# Patient Record
Sex: Male | Born: 1945
Health system: Southern US, Community
[De-identification: ages and names within clinical notes are randomized; demographics above are authoritative.]

## PROBLEM LIST (undated history)

## (undated) DIAGNOSIS — J3800 Paralysis of vocal cords and larynx, unspecified: Secondary | ICD-10-CM

## (undated) DIAGNOSIS — D649 Anemia, unspecified: Secondary | ICD-10-CM

## (undated) DIAGNOSIS — G4730 Sleep apnea, unspecified: Secondary | ICD-10-CM

## (undated) DIAGNOSIS — K219 Gastro-esophageal reflux disease without esophagitis: Secondary | ICD-10-CM

## (undated) DIAGNOSIS — K319 Disease of stomach and duodenum, unspecified: Secondary | ICD-10-CM

## (undated) DIAGNOSIS — J189 Pneumonia, unspecified organism: Secondary | ICD-10-CM

## (undated) DIAGNOSIS — D492 Neoplasm of unspecified behavior of bone, soft tissue, and skin: Secondary | ICD-10-CM

## (undated) DIAGNOSIS — C754 Malignant neoplasm of carotid body: Secondary | ICD-10-CM

## (undated) DIAGNOSIS — T8859XA Other complications of anesthesia, initial encounter: Secondary | ICD-10-CM

## (undated) DIAGNOSIS — K5790 Diverticulosis of intestine, part unspecified, without perforation or abscess without bleeding: Secondary | ICD-10-CM

## (undated) DIAGNOSIS — F329 Major depressive disorder, single episode, unspecified: Secondary | ICD-10-CM

## (undated) DIAGNOSIS — E785 Hyperlipidemia, unspecified: Secondary | ICD-10-CM

## (undated) DIAGNOSIS — F32A Depression, unspecified: Secondary | ICD-10-CM

## (undated) DIAGNOSIS — H9190 Unspecified hearing loss, unspecified ear: Secondary | ICD-10-CM

## (undated) DIAGNOSIS — H6690 Otitis media, unspecified, unspecified ear: Secondary | ICD-10-CM

## (undated) DIAGNOSIS — T4145XA Adverse effect of unspecified anesthetic, initial encounter: Secondary | ICD-10-CM

## (undated) DIAGNOSIS — M1990 Unspecified osteoarthritis, unspecified site: Secondary | ICD-10-CM

## (undated) DIAGNOSIS — K5792 Diverticulitis of intestine, part unspecified, without perforation or abscess without bleeding: Secondary | ICD-10-CM

## (undated) DIAGNOSIS — K222 Esophageal obstruction: Secondary | ICD-10-CM

## (undated) DIAGNOSIS — N400 Enlarged prostate without lower urinary tract symptoms: Secondary | ICD-10-CM

## (undated) DIAGNOSIS — F419 Anxiety disorder, unspecified: Secondary | ICD-10-CM

## (undated) DIAGNOSIS — I700 Atherosclerosis of aorta: Secondary | ICD-10-CM

## (undated) HISTORY — PX: CAROTID BODY TUMOR EXCISION: SHX5156

## (undated) HISTORY — DX: Major depressive disorder, single episode, unspecified: F32.9

## (undated) HISTORY — PX: ESOPHAGOGASTRODUODENOSCOPY (EGD) WITH ESOPHAGEAL DILATION: SHX5812

## (undated) HISTORY — DX: Benign prostatic hyperplasia without lower urinary tract symptoms: N40.0

## (undated) HISTORY — DX: Diverticulosis of intestine, part unspecified, without perforation or abscess without bleeding: K57.90

## (undated) HISTORY — DX: Gastro-esophageal reflux disease without esophagitis: K21.9

## (undated) HISTORY — PX: BACK SURGERY: SHX140

## (undated) HISTORY — DX: Otitis media, unspecified, unspecified ear: H66.90

## (undated) HISTORY — DX: Pneumonia, unspecified organism: J18.9

## (undated) HISTORY — DX: Diverticulitis of intestine, part unspecified, without perforation or abscess without bleeding: K57.92

## (undated) HISTORY — DX: Malignant neoplasm of carotid body: C75.4

## (undated) HISTORY — PX: CHOLECYSTECTOMY OPEN: SUR202

## (undated) HISTORY — DX: Disease of stomach and duodenum, unspecified: K31.9

## (undated) HISTORY — PX: APPENDECTOMY: SHX54

## (undated) HISTORY — DX: Esophageal obstruction: K22.2

## (undated) HISTORY — DX: Depression, unspecified: F32.A

## (undated) HISTORY — PX: PATELLA ARTHROPLASTY: SUR73

## (undated) HISTORY — PX: KNEE ARTHROSCOPY: SHX127

## (undated) HISTORY — DX: Paralysis of vocal cords and larynx, unspecified: J38.00

## (undated) HISTORY — DX: Essential (primary) hypertension: I10

## (undated) HISTORY — PX: DEEP NECK LYMPH NODE BIOPSY / EXCISION: SUR126

## (undated) HISTORY — PX: UPPER GASTROINTESTINAL ENDOSCOPY: SHX188

## (undated) HISTORY — PX: JOINT REPLACEMENT: SHX530

## (undated) HISTORY — DX: Neoplasm of unspecified behavior of bone, soft tissue, and skin: D49.2

## (undated) HISTORY — DX: Hyperlipidemia, unspecified: E78.5

---
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
## 2001-04-27 ENCOUNTER — Ambulatory Visit (HOSPITAL_BASED_OUTPATIENT_CLINIC_OR_DEPARTMENT_OTHER): Admission: RE | Admit: 2001-04-27 | Discharge: 2001-04-27 | Payer: Self-pay | Admitting: Orthopedic Surgery

## 2006-09-19 DIAGNOSIS — J3801 Paralysis of vocal cords and larynx, unilateral: Secondary | ICD-10-CM

## 2006-09-19 DIAGNOSIS — Z8603 Personal history of neoplasm of uncertain behavior: Secondary | ICD-10-CM

## 2006-09-19 DIAGNOSIS — D492 Neoplasm of unspecified behavior of bone, soft tissue, and skin: Secondary | ICD-10-CM

## 2006-09-19 HISTORY — DX: Paralysis of vocal cords and larynx, unilateral: J38.01

## 2006-09-19 HISTORY — DX: Personal history of neoplasm of uncertain behavior: Z86.03

## 2006-09-19 HISTORY — DX: Neoplasm of unspecified behavior of bone, soft tissue, and skin: D49.2

## 2006-10-23 ENCOUNTER — Encounter: Admission: RE | Admit: 2006-10-23 | Discharge: 2006-10-23 | Payer: Self-pay | Admitting: Otolaryngology

## 2006-12-19 HISTORY — PX: OTHER SURGICAL HISTORY: SHX169

## 2007-01-17 DIAGNOSIS — J3800 Paralysis of vocal cords and larynx, unspecified: Secondary | ICD-10-CM | POA: Insufficient documentation

## 2007-03-15 ENCOUNTER — Ambulatory Visit: Payer: Self-pay | Admitting: Internal Medicine

## 2007-09-20 DIAGNOSIS — D126 Benign neoplasm of colon, unspecified: Secondary | ICD-10-CM

## 2007-09-20 HISTORY — DX: Benign neoplasm of colon, unspecified: D12.6

## 2010-06-22 ENCOUNTER — Encounter: Admission: RE | Admit: 2010-06-22 | Discharge: 2010-06-22 | Payer: Self-pay | Admitting: Emergency Medicine

## 2010-08-18 ENCOUNTER — Inpatient Hospital Stay (HOSPITAL_COMMUNITY)
Admission: EM | Admit: 2010-08-18 | Discharge: 2010-08-20 | Payer: Self-pay | Source: Home / Self Care | Admitting: Emergency Medicine

## 2010-10-10 ENCOUNTER — Encounter: Payer: Self-pay | Admitting: Family Medicine

## 2010-11-29 LAB — CBC
HCT: 30.9 % — ABNORMAL LOW (ref 39.0–52.0)
HCT: 33.3 % — ABNORMAL LOW (ref 39.0–52.0)
Hemoglobin: 10.3 g/dL — ABNORMAL LOW (ref 13.0–17.0)
Hemoglobin: 11 g/dL — ABNORMAL LOW (ref 13.0–17.0)
MCH: 30.5 pg (ref 26.0–34.0)
MCH: 30.9 pg (ref 26.0–34.0)
MCHC: 33 g/dL (ref 30.0–36.0)
MCHC: 33.3 g/dL (ref 30.0–36.0)
MCV: 92.2 fL (ref 78.0–100.0)
MCV: 92.8 fL (ref 78.0–100.0)
Platelets: 147 10*3/uL — ABNORMAL LOW (ref 150–400)
Platelets: 151 10*3/uL (ref 150–400)
RBC: 3.33 MIL/uL — ABNORMAL LOW (ref 4.22–5.81)
RBC: 3.61 MIL/uL — ABNORMAL LOW (ref 4.22–5.81)
RDW: 13.8 % (ref 11.5–15.5)
RDW: 14.4 % (ref 11.5–15.5)
WBC: 12.8 10*3/uL — ABNORMAL HIGH (ref 4.0–10.5)
WBC: 15.1 10*3/uL — ABNORMAL HIGH (ref 4.0–10.5)

## 2010-11-29 LAB — COMPREHENSIVE METABOLIC PANEL
ALT: 37 U/L (ref 0–53)
AST: 29 U/L (ref 0–37)
Albumin: 2.7 g/dL — ABNORMAL LOW (ref 3.5–5.2)
Alkaline Phosphatase: 54 U/L (ref 39–117)
BUN: 18 mg/dL (ref 6–23)
CO2: 28 mEq/L (ref 19–32)
Calcium: 7.9 mg/dL — ABNORMAL LOW (ref 8.4–10.5)
Chloride: 104 mEq/L (ref 96–112)
Creatinine, Ser: 1.09 mg/dL (ref 0.4–1.5)
GFR calc Af Amer: >60 mL/min (ref >60)
GFR calc non Af Amer: >60 mL/min (ref >60)
Glucose, Bld: 113 mg/dL — ABNORMAL HIGH (ref 70–99)
Potassium: 3.9 mEq/L (ref 3.5–5.1)
Sodium: 137 mEq/L (ref 135–145)
Total Bilirubin: 1.4 mg/dL — ABNORMAL HIGH (ref 0.3–1.2)
Total Protein: 5.2 g/dL — ABNORMAL LOW (ref 6.0–8.3)

## 2010-11-30 LAB — BASIC METABOLIC PANEL
BUN: 15 mg/dL (ref 6–23)
CO2: 26 mEq/L (ref 19–32)
Calcium: 8.2 mg/dL — ABNORMAL LOW (ref 8.4–10.5)
Chloride: 109 mEq/L (ref 96–112)
Creatinine, Ser: 1.15 mg/dL (ref 0.4–1.5)
GFR calc Af Amer: >60 mL/min (ref >60)
GFR calc non Af Amer: >60 mL/min (ref >60)
Glucose, Bld: 178 mg/dL — ABNORMAL HIGH (ref 70–99)
Potassium: 4.1 mEq/L (ref 3.5–5.1)
Sodium: 138 mEq/L (ref 135–145)

## 2010-11-30 LAB — CULTURE, BLOOD (ROUTINE X 2)
Culture  Setup Time: 201112010057
Culture  Setup Time: 201112010058
Culture: NO GROWTH
Culture: NO GROWTH

## 2010-11-30 LAB — DIFFERENTIAL
Basophils Absolute: 0 10*3/uL (ref 0.0–0.1)
Basophils Relative: 0 % (ref 0–1)
Eosinophils Absolute: 0 10*3/uL (ref 0.0–0.7)
Eosinophils Relative: 0 % (ref 0–5)
Lymphocytes Relative: 1 % — ABNORMAL LOW (ref 12–46)
Lymphs Abs: 0.2 10*3/uL — ABNORMAL LOW (ref 0.7–4.0)
Monocytes Absolute: 0.7 10*3/uL (ref 0.1–1.0)
Monocytes Relative: 4 % (ref 3–12)
Neutro Abs: 16.9 10*3/uL — ABNORMAL HIGH (ref 1.7–7.7)
Neutrophils Relative %: 95 % — ABNORMAL HIGH (ref 43–77)

## 2010-11-30 LAB — POCT CARDIAC MARKERS
CKMB, poc: <1 ng/mL — ABNORMAL LOW (ref 1.0–8.0)
Myoglobin, poc: 53.6 ng/mL (ref 12–200)
Troponin i, poc: <0.05 ng/mL (ref 0.00–0.09)

## 2010-11-30 LAB — CBC
HCT: 36.6 % — ABNORMAL LOW (ref 39.0–52.0)
Hemoglobin: 12.1 g/dL — ABNORMAL LOW (ref 13.0–17.0)
MCH: 30.7 pg (ref 26.0–34.0)
MCHC: 33.1 g/dL (ref 30.0–36.0)
MCV: 92.9 fL (ref 78.0–100.0)
Platelets: 194 10*3/uL (ref 150–400)
RBC: 3.94 MIL/uL — ABNORMAL LOW (ref 4.22–5.81)
RDW: 14 % (ref 11.5–15.5)
WBC: 17.8 10*3/uL — ABNORMAL HIGH (ref 4.0–10.5)

## 2011-02-01 NOTE — Assessment & Plan Note (Signed)
[name]                             PULMONARY OFFICE NOTE   NAME:Chris Welch                     MRN:          [id number]  DATE:03/15/2007                            DOB:          [birth year]    REASON FOR CONSULTATION:  Pulmonary infiltrates.   HISTORY:  This is a very complicated 65 year old white male who was well  in December of 2007 but subsequently developed hoarseness and was seen  by Dr. Jearld Fenton who referred him to Dr. Lendell Caprice at [facility].  There he has  undergone several throat surgeries and has been complicated by  documented aspiration syndrome for which he is under week 2 of a 12 week  speech therapy treatment program.  He has had several different rounds  of antibiotics for pneumonia, presumed to be aspiration, and has had a  persistent abnormal chest x-ray for which he is now seen at the request  of Dr. Perrin Maltese.   Presently, the patient says he is overall better with less dyspnea and  dysphagia compared to when he was at  his worst.  He denies any excess  sputum production. He has no purulence or pleuritic pain, fever, chills  or sweats, orthopnea, PND or leg swelling.   PAST MEDICAL HISTORY:  Significant only for throat surgery as noted.   ALLERGIES:  PENICILLIN causes hives.   MEDICATIONS:  Omeprazole 20 mg, two b.i.d. before meals with ProAir  p.r.n., which he says helps some.   SOCIAL HISTORY:  He has never smoked.  He has no unusual travel, pet or  hobby exposure.   FAMILY HISTORY:  Positive for emphysema in his father who is a smoker.   REVIEW OF SYSTEMS:  Taken in detail on the work sheet and is negative  except as outlined above.  He has lost 10 to 15 pounds since the  surgery.   PHYSICAL EXAMINATION:  GENERAL APPEARANCE:  This is a hoarse, ambulatory  white male who actually is relatively stoic and in no acute distress.  His wife appears to be in much more distress than he regarding these  issues.  VITAL SIGNS:  He is afebrile with stable vital signs.  HEENT:  Unremarkable. Pharynx clear.  LUNG:  Fields reveal diminished breath sounds but no wheezing.  HEART:  There is a regular rate and rhythm without murmurs, rubs or  gallops.  ABDOMEN:  Soft, benign.  EXTREMITIES:  Warm, benign without calf tenderness, cyanosis, clubbing  or edema.   CLINICAL DATA:  Chest x-ray was reviewed today and is normal.   IMPRESSION:  Recurrent aspiration pneumonia in this patient with chronic  upper airway issues following throat surgery.  I am not exactly sure  what was done nor what the plan is but I have explained to the patient  and, more importantly, to his wife that they can expect that until and  unless his upper airway fully heals from surgery that he will be short  of breath if there is obstruction at the level of the vocal cords and  have a tendency towards recurrent aspiration, if there is any problem at  all related to the swallowing maneuver.  Since he is only on week 2 of  12 of planned therapy for speech, I endorsed this whole-heartedly and  recommended also continued treatment of his gastroesophageal reflux  disease, since it does give [place] one opportunity to help the upper airway  heal and I reviewed with him a diet for this purpose, along with the  avoidance of mint and menthol (apparently he likes to eat a lot of  peppermint).   If he does become short of breath or get more wheezing or cough he needs  to use the ProAir up to every four hours.  If he begins to have purulent  sputum or fever then I would definitely consider treating him with  antibiotics but note that with chronic aspirators, it is probably better  to wait until definite evidence of infection ( rather than treating  chronically or too soon because of the issue of developing resistance).  This seemed to assure his wife that he was on the right track and I have  told them pulmonary followup can be on a p.r.n. basis (or arranged by   his ENT physician in the [facility] to keep things all under one  roof).     Chris Welch. Sherene Sires, MD, [facility]  Electronically Signed    MBW/MedQ  DD: 03/16/2007  DT: 03/16/2007  Job #: [id number]   cc:   Jonita Albee, M.D.

## 2011-02-04 NOTE — Op Note (Signed)
[place]  Patient:    Chris Welch, Chris Welch                     MRN: [id number] Proc. Date: 04/27/01 Adm. Date:  [id number] Attending:  Ronne Binning                           Operative Report  PREOPERATIVE DIAGNOSIS:  Laceration central slip, boutonniere deformity right middle finger.  POSTOPERATIVE DIAGNOSIS:  Laceration central slip, boutonniere deformity right middle finger.  OPERATION:  Exploration/repair central slip, right middle finger.  SURGEON:  Nicki Reaper, M.D.  ASSISTANT:  Joaquin Courts, R.N.  ANESTHESIA:  Forearm-based IV regional.  DATE OF OPERATION:  April 27, 2001  ANESTHESIOLOGIST:  Edwin Cap. Zoila Shutter, M.D.  HISTORY:  The patient is a 65 year old male who suffered a laceration over the PIP joint area of his right middle finger.  He has had a gradual formation of a deformity of the boutonniere to his right middle finger.  PROCEDURE:  The patient is brought to the operating room where a forearm-based IV regional anesthetic was carried out without difficulty.  He was prepped and draped using Betadine scrub and solution with the right arm free in a supine position.  Curvilinear incision was made over the proximal middle phalanx, carrying it across to the PIP joint.  This was carried down through subcutaneous tissue, bleeders electrocauterized.  The central slip extensor tendon hood was identified.  Significant scarring was present over the PIP joint.  The joint was opened after localization of the distal-most aspect of the central slip proximally.  The scar was excised.  There was a portion of central slip present on the middle phalanx and repair was performed using a modified Kessler using 3-0 Ethibond.  Prior to closure of the tendon, a 3.5 K-wire was passed across the joint holding it in full extension.  The tendon was then tied and secured.  Two figure-of-eight sutures were also added to the repair.  This was done  following irrigation.  The skin was closed with interrupted 5-0 nylon sutures.  Sterile compressive dressing and splint to the finger was applied, leaving the DIP free.  The patient tolerated the procedure well and was taken to the recovery room for observation in satisfactory condition.  DISPOSITION:  He is discharged home to return to [facility] [place] in one week on Vicodin and Septra DS. DD:  04/27/01 TD:  04/28/01 Job: [id number]

## 2011-02-16 ENCOUNTER — Telehealth: Payer: Self-pay | Admitting: *Deleted

## 2011-02-16 ENCOUNTER — Ambulatory Visit (AMBULATORY_SURGERY_CENTER): Payer: Managed Care, Other (non HMO) | Admitting: *Deleted

## 2011-02-16 ENCOUNTER — Encounter: Payer: Self-pay | Admitting: Internal Medicine

## 2011-02-16 VITALS — Ht 75.0 in | Wt 200.7 lb

## 2011-02-16 DIAGNOSIS — Z86010 Personal history of colonic polyps: Secondary | ICD-10-CM

## 2011-02-16 MED ORDER — PEG-KCL-NACL-NASULF-NA ASC-C 100 G PO SOLR: ORAL | Status: DC

## 2011-02-16 NOTE — Progress Notes (Signed)
Pt had colonoscopy in 2009 with Dr. Charna Elizabeth. Release of information form signed and given to Ronny Bacon, CMA.  Ezra Sites

## 2011-02-16 NOTE — Telephone Encounter (Signed)
Pt had colonoscopy in 2009 with Dr. Charna Elizabeth. Pt says he had polyps, does not know what kind of polyps. Release of information form signed and given to Ronny Bacon, CMA.  Ezra Sites

## 2011-02-16 NOTE — Telephone Encounter (Signed)
Release faxed to Dr Kenna Gilbert office.

## 2011-02-24 ENCOUNTER — Encounter: Payer: Self-pay | Admitting: Internal Medicine

## 2011-02-24 ENCOUNTER — Ambulatory Visit (AMBULATORY_SURGERY_CENTER): Payer: Managed Care, Other (non HMO) | Admitting: Internal Medicine

## 2011-02-24 VITALS — BP 156/94 | HR 78 | Temp 97.9°F | Resp 20 | Ht 75.0 in | Wt 191.0 lb

## 2011-02-24 DIAGNOSIS — D128 Benign neoplasm of rectum: Secondary | ICD-10-CM

## 2011-02-24 DIAGNOSIS — D129 Benign neoplasm of anus and anal canal: Secondary | ICD-10-CM

## 2011-02-24 DIAGNOSIS — Z86010 Personal history of colonic polyps: Secondary | ICD-10-CM

## 2011-02-24 DIAGNOSIS — Z1211 Encounter for screening for malignant neoplasm of colon: Secondary | ICD-10-CM

## 2011-02-24 DIAGNOSIS — D126 Benign neoplasm of colon, unspecified: Secondary | ICD-10-CM

## 2011-02-24 MED ORDER — SODIUM CHLORIDE 0.9 % IV SOLN: 500.0000 mL | INTRAVENOUS | Status: DC

## 2011-02-24 NOTE — Patient Instructions (Signed)
Findings:  Diverticulosis, Polyp  Recommendations:  High Fiber diet, Repeat colonoscopy in 5-7 years.  Instructions and teaching explained and given to care partner.

## 2011-02-25 ENCOUNTER — Telehealth: Payer: Self-pay

## 2011-02-25 NOTE — Telephone Encounter (Signed)
No answer

## 2011-03-01 ENCOUNTER — Encounter: Payer: Self-pay | Admitting: Internal Medicine

## 2011-03-04 ENCOUNTER — Encounter: Payer: Self-pay | Admitting: Internal Medicine

## 2011-04-18 ENCOUNTER — Emergency Department (HOSPITAL_COMMUNITY)
Admission: EM | Admit: 2011-04-18 | Discharge: 2011-04-18 | Disposition: A | Discharge status: Home / Self Care | Payer: Managed Care, Other (non HMO) | Attending: Emergency Medicine | Admitting: Emergency Medicine

## 2011-04-18 ENCOUNTER — Emergency Department (HOSPITAL_COMMUNITY): Payer: Managed Care, Other (non HMO)

## 2011-04-18 DIAGNOSIS — I10 Essential (primary) hypertension: Secondary | ICD-10-CM | POA: Insufficient documentation

## 2011-04-18 DIAGNOSIS — K219 Gastro-esophageal reflux disease without esophagitis: Secondary | ICD-10-CM | POA: Insufficient documentation

## 2011-04-18 DIAGNOSIS — Z79899 Other long term (current) drug therapy: Secondary | ICD-10-CM | POA: Insufficient documentation

## 2011-04-18 DIAGNOSIS — R079 Chest pain, unspecified: Secondary | ICD-10-CM | POA: Insufficient documentation

## 2011-04-18 DIAGNOSIS — E7800 Pure hypercholesterolemia, unspecified: Secondary | ICD-10-CM | POA: Insufficient documentation

## 2011-04-18 DIAGNOSIS — R5381 Other malaise: Secondary | ICD-10-CM | POA: Insufficient documentation

## 2011-04-18 DIAGNOSIS — Z7982 Long term (current) use of aspirin: Secondary | ICD-10-CM | POA: Insufficient documentation

## 2011-04-18 DIAGNOSIS — R5383 Other fatigue: Secondary | ICD-10-CM | POA: Insufficient documentation

## 2011-04-18 DIAGNOSIS — R42 Dizziness and giddiness: Secondary | ICD-10-CM | POA: Insufficient documentation

## 2011-04-18 LAB — CBC
HCT: 37.2 % — ABNORMAL LOW (ref 39.0–52.0)
Hemoglobin: 12.8 g/dL — ABNORMAL LOW (ref 13.0–17.0)
MCH: 29.6 pg (ref 26.0–34.0)
MCHC: 34.4 g/dL (ref 30.0–36.0)
MCV: 85.9 fL (ref 78.0–100.0)
Platelets: 169 10*3/uL (ref 150–400)
RBC: 4.33 MIL/uL (ref 4.22–5.81)
RDW: 13.1 % (ref 11.5–15.5)
WBC: 7.2 10*3/uL (ref 4.0–10.5)

## 2011-04-18 LAB — BASIC METABOLIC PANEL
BUN: 11 mg/dL (ref 6–23)
CO2: 30 mEq/L (ref 19–32)
Calcium: 9.2 mg/dL (ref 8.4–10.5)
Chloride: 105 mEq/L (ref 96–112)
Creatinine, Ser: 0.87 mg/dL (ref 0.50–1.35)
GFR calc Af Amer: >60 mL/min (ref >60)
GFR calc non Af Amer: >60 mL/min (ref >60)
Glucose, Bld: 96 mg/dL (ref 70–99)
Potassium: 4.6 mEq/L (ref 3.5–5.1)
Sodium: 142 mEq/L (ref 135–145)

## 2011-04-18 LAB — TROPONIN I
Troponin I: <0.3 ng/mL (ref <0.30)
Troponin I: <0.3 ng/mL (ref <0.30)

## 2011-04-18 LAB — DIFFERENTIAL
Basophils Absolute: 0 10*3/uL (ref 0.0–0.1)
Basophils Relative: 0 % (ref 0–1)
Eosinophils Absolute: 0.1 10*3/uL (ref 0.0–0.7)
Eosinophils Relative: 1 % (ref 0–5)
Lymphocytes Relative: 11 % — ABNORMAL LOW (ref 12–46)
Lymphs Abs: 0.8 10*3/uL (ref 0.7–4.0)
Monocytes Absolute: 0.5 10*3/uL (ref 0.1–1.0)
Monocytes Relative: 7 % (ref 3–12)
Neutro Abs: 5.8 10*3/uL (ref 1.7–7.7)
Neutrophils Relative %: 81 % — ABNORMAL HIGH (ref 43–77)

## 2011-04-18 LAB — CK TOTAL AND CKMB (NOT AT ARMC)
CK, MB: 2.7 ng/mL (ref 0.3–4.0)
CK, MB: 2.5 ng/mL (ref 0.3–4.0)
Relative Index: 2.3 (ref 0.0–2.5)
Relative Index: INVALID (ref 0.0–2.5)
Total CK: 117 U/L (ref 7–232)
Total CK: 98 U/L (ref 7–232)

## 2011-10-05 ENCOUNTER — Encounter: Payer: Self-pay | Admitting: Physician Assistant

## 2011-10-05 DIAGNOSIS — F329 Major depressive disorder, single episode, unspecified: Secondary | ICD-10-CM | POA: Insufficient documentation

## 2011-10-05 DIAGNOSIS — H9193 Unspecified hearing loss, bilateral: Secondary | ICD-10-CM

## 2011-10-05 DIAGNOSIS — IMO0002 Reserved for concepts with insufficient information to code with codable children: Secondary | ICD-10-CM

## 2011-10-05 DIAGNOSIS — F32A Depression, unspecified: Secondary | ICD-10-CM | POA: Insufficient documentation

## 2011-10-05 DIAGNOSIS — K5790 Diverticulosis of intestine, part unspecified, without perforation or abscess without bleeding: Secondary | ICD-10-CM | POA: Insufficient documentation

## 2011-10-05 DIAGNOSIS — J3800 Paralysis of vocal cords and larynx, unspecified: Secondary | ICD-10-CM

## 2011-10-13 ENCOUNTER — Encounter (INDEPENDENT_AMBULATORY_CARE_PROVIDER_SITE_OTHER): Payer: Managed Care, Other (non HMO) | Admitting: Physician Assistant

## 2011-10-13 DIAGNOSIS — R059 Cough, unspecified: Secondary | ICD-10-CM

## 2011-10-13 DIAGNOSIS — R05 Cough: Secondary | ICD-10-CM

## 2011-10-31 ENCOUNTER — Other Ambulatory Visit: Payer: Self-pay | Admitting: Family Medicine

## 2011-10-31 MED ORDER — BUPROPION HCL ER (XL) 150 MG PO TB24: 450.0000 mg | ORAL_TABLET | Freq: Every day | ORAL | Status: DC

## 2011-12-16 ENCOUNTER — Telehealth: Payer: Self-pay

## 2011-12-16 NOTE — Telephone Encounter (Signed)
Chris Welch from [place] [facility] states that they need a verbal authorization to refill pts avodart, and tamsulosin. [facility]:[phone] opt.3 reference # [id number]

## 2011-12-19 NOTE — Telephone Encounter (Signed)
Pt has been on this combination for a while.  Ok to continue.  We can give 1 90d supply but then will need an OV before more refills are ok.

## 2011-12-19 NOTE — Telephone Encounter (Signed)
Called [employer], no answer x 5 minutes.

## 2011-12-20 NOTE — Telephone Encounter (Signed)
Gave verbal auth to [place] to RF both RXs for one 90 day supply, no add'l RFs, and asked them to put note on RFs instr'ing pt that OV is needed for add'l RFs.

## 2011-12-30 ENCOUNTER — Ambulatory Visit (INDEPENDENT_AMBULATORY_CARE_PROVIDER_SITE_OTHER): Payer: Managed Care, Other (non HMO) | Admitting: Family Medicine

## 2011-12-30 ENCOUNTER — Encounter: Payer: Self-pay | Admitting: Family Medicine

## 2011-12-30 VITALS — BP 111/73 | HR 75 | Temp 98.1°F | Resp 16 | Ht 73.0 in | Wt 204.0 lb

## 2011-12-30 DIAGNOSIS — H6090 Unspecified otitis externa, unspecified ear: Secondary | ICD-10-CM

## 2011-12-30 DIAGNOSIS — H60339 Swimmer's ear, unspecified ear: Secondary | ICD-10-CM

## 2011-12-30 NOTE — Progress Notes (Signed)
This is a 66 year old man who is hearing impaired. He uses hearing aids but has not been able to do the left one for 2 weeks since he developed ear pain and decreased hearing on that side. He's had no fever.  Patient is also had surgery on his neck on the right side after which she suffered a vocal cord paralysis. He takes Flonase and needs a refill on that  Objective: Left ear canal shows yellow-green purulent debris plastered up on the posterior aspect of the canal wall. The TM is not well-visualized. He does have pain with irregular manipulation.  Oropharynx clear  Right TM normal  Neck supple no adenopathy with right neck surgical scar anterolaterally. He is mildly hoarse.  Assessment otitis externa, chronic allergic rhinitis  Plan refill Flonase, Cipro, Cortisporin otic 3 times a day  Recheck 4 days

## 2011-12-30 NOTE — Patient Instructions (Signed)
Otitis Externa  Otitis externa ("swimmer's ear") is a germ (bacterial) or fungal infection of the outer ear canal (from the eardrum to the outside of the ear). Swimming in dirty water may cause swimmer's ear. It also may be caused by moisture in the ear from water remaining after swimming or bathing. Often the first signs of infection may be itching in the ear canal. This may progress to ear canal swelling, redness, and pus drainage, which may be signs of infection.  HOME CARE INSTRUCTIONS    Apply the antibiotic drops to the ear canal as prescribed by your doctor.   This can be a very painful medical condition. A strong pain reliever may be prescribed.   Only take over-the-counter or prescription medicines for pain, discomfort, or fever as directed by your caregiver.   If your caregiver has given you a follow-up appointment, it is very important to keep that appointment. Not keeping the appointment could result in a chronic or permanent injury, pain, hearing loss and disability. If there is any problem keeping the appointment, you must call back to this facility for assistance.  PREVENTION    It is important to keep your ear dry. Use the corner of a towel to wick water out of the ear canal after swimming or bathing.   Avoid scratching in your ear. This can damage the ear canal or remove the protective wax lining the canal and make it easier for germs (bacteria) or a fungus to grow.   You may use ear drops made of rubbing alcohol and vinegar after swimming to prevent future "swimmer's ear" infections. Make up a small bottle of equal parts white vinegar and alcohol. Put 3 or 4 drops into each ear after swimming.   Avoid swimming in lakes, polluted water, or poorly chlorinated pools.  SEEK MEDICAL CARE IF:    An oral temperature above 102 F (38.9 C) develops.   Your ear is still painful after 3 days and shows signs of getting worse (redness, swelling, pain, or pus).  MAKE SURE YOU:    Understand these  instructions.   Will watch your condition.   Will get help right away if you are not doing well or get worse.  Document Released: 09/05/2005 Document Revised: 08/25/2011 Document Reviewed: 04/11/2008  Exit[employer] Patient Information 2012 [employer].

## 2012-01-03 ENCOUNTER — Ambulatory Visit (INDEPENDENT_AMBULATORY_CARE_PROVIDER_SITE_OTHER): Payer: Managed Care, Other (non HMO) | Admitting: Family Medicine

## 2012-01-03 VITALS — BP 138/83 | HR 60 | Temp 97.9°F | Resp 16 | Ht 73.0 in | Wt 206.0 lb

## 2012-01-03 DIAGNOSIS — H60339 Swimmer's ear, unspecified ear: Secondary | ICD-10-CM

## 2012-01-03 DIAGNOSIS — H6090 Unspecified otitis externa, unspecified ear: Secondary | ICD-10-CM

## 2012-01-03 MED ORDER — NEOMYCIN-POLYMYXIN-HC 3.5-10000-1 OT SOLN: 3.0000 [drp] | Freq: Four times a day (QID) | OTIC | Status: AC

## 2012-01-03 NOTE — Progress Notes (Signed)
  Patient Name: Chris Welch Date of Birth: 1946-02-22 Medical Record Number: [id number] Gender: male Date of Encounter: 01/03/2012  History of Present Illness:  Chris Welch is a 66 y.o. very pleasant male patient who presents with the following:  Here today to recheck a left ear infection.  Seen here on 12/30/11 and started on po cipro and cortisporin otic.   He feels that his ear is about the same to a little bit better.  He has not started using his hearing aid in that ear yet.  He notes no fever or other symptoms at this time.  Planning to travel to [place] [employer] [place] this weekend to celebrate his anniversary  Patient Active Problem List  Diagnoses  . Vocal cord paralysis  . Recurrent aspiration bronchitis/pneumonia  . Diverticulosis  . Hearing loss of both ears  . Depression   Past Medical History  Diagnosis Date  . Depression   . GERD (gastroesophageal reflux disease)   . Enlarged prostate   . 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 Ear infection ear infection lasting past 2 months, still ongoing, pt on antibiotics & drops. pt states "ear infection has eaten holes through bilateral ear drums,  I am hard of hearing"  . Vocal cord paralysis     Right  . Diverticulosis   . Diverticulitis    Past Surgical History  Procedure Date  . Tumor right side of neck 12/2006    resection of R cervical paraganglioma  . Deep neck lymph node biopsy / excision   . Knee arthrotomy     has had 5 surgeries right knee  . Right inguinal hernia   . Cholecystectomy    History  Substance Use Topics  . Smoking status: Never Smoker   . Smokeless tobacco: Not on file  . Alcohol Use: No     rare alcohol intake   Family History  Problem Relation Age of Onset  . Lung cancer Brother    Allergies  Allergen Reactions  . Penicillins Other (See Comments)    Ulcers on eyes    Medication list has been reviewed and updated.  Review of Systems: As per  HPI- otherwise negative.   Physical Examination: Filed Vitals:   01/03/12 0814  BP: 138/83  Pulse: 60  Temp: 97.9 F (36.6 C)  TempSrc: Oral  Resp: 16  Height: 6\' 1"  (1.854 m)  Weight: 206 lb (93.441 kg)    Body mass index is 27.18 kg/(m^2).  GEN: WDWN, NAD, Non-toxic, A & O x 3 HEENT: Atraumatic, Normocephalic. Neck supple. No masses, No LAD.  Left ear canal has some debris and moisture, but minimal if any swelling.  TM is partially obscured by debris but otherwise appears normal.  Right ear and TM wnl. Oropharynx wnl, PEERL Ears and Nose: No external deformity. CV: RRR, No M/G/R. No JVD. No thrill. No extra heart sounds. PULM: CTA B, no wheezes, crackles, rhonchi. No retractions. No resp. distress. No accessory muscle use. EXTR: No c/c/e NEURO Normal gait.  PSYCH: Normally interactive. Conversant. Not depressed or anxious appearing.  Calm demeanor.    Assessment and Plan: 1. Otitis externa  neomycin-polymyxin-hydrocortisone (CORTISPORIN) otic solution   Continue medication as above- did send a RF of ear drop to his pharmacy in case he runs out too soon.  Otherwise he will call if not continuing to improve over the next few days- Sooner if worse.

## 2012-01-26 ENCOUNTER — Other Ambulatory Visit: Payer: Self-pay | Admitting: Dermatology

## 2012-04-02 ENCOUNTER — Other Ambulatory Visit: Payer: Self-pay | Admitting: Physician Assistant

## 2012-04-17 ENCOUNTER — Ambulatory Visit (INDEPENDENT_AMBULATORY_CARE_PROVIDER_SITE_OTHER): Payer: Managed Care, Other (non HMO) | Admitting: Physician Assistant

## 2012-04-17 ENCOUNTER — Encounter: Payer: Self-pay | Admitting: Physician Assistant

## 2012-04-17 VITALS — BP 120/76 | HR 74 | Temp 98.4°F | Resp 16 | Ht 73.0 in | Wt 204.0 lb

## 2012-04-17 DIAGNOSIS — Z0000 Encounter for general adult medical examination without abnormal findings: Secondary | ICD-10-CM

## 2012-04-17 DIAGNOSIS — H9210 Otorrhea, unspecified ear: Secondary | ICD-10-CM

## 2012-04-17 DIAGNOSIS — N529 Male erectile dysfunction, unspecified: Secondary | ICD-10-CM

## 2012-04-17 DIAGNOSIS — Z23 Encounter for immunization: Secondary | ICD-10-CM

## 2012-04-17 DIAGNOSIS — Z125 Encounter for screening for malignant neoplasm of prostate: Secondary | ICD-10-CM

## 2012-04-17 DIAGNOSIS — F32A Depression, unspecified: Secondary | ICD-10-CM

## 2012-04-17 DIAGNOSIS — J309 Allergic rhinitis, unspecified: Secondary | ICD-10-CM

## 2012-04-17 DIAGNOSIS — Z1211 Encounter for screening for malignant neoplasm of colon: Secondary | ICD-10-CM

## 2012-04-17 DIAGNOSIS — N400 Enlarged prostate without lower urinary tract symptoms: Secondary | ICD-10-CM

## 2012-04-17 DIAGNOSIS — F329 Major depressive disorder, single episode, unspecified: Secondary | ICD-10-CM

## 2012-04-17 LAB — COMPREHENSIVE METABOLIC PANEL
ALT: 24 U/L (ref 0–53)
AST: 19 U/L (ref 0–37)
Albumin: 4.7 g/dL (ref 3.5–5.2)
Alkaline Phosphatase: 75 U/L (ref 39–117)
BUN: 14 mg/dL (ref 6–23)
CO2: 29 mEq/L (ref 19–32)
Calcium: 9.9 mg/dL (ref 8.4–10.5)
Chloride: 99 mEq/L (ref 96–112)
Creat: 0.99 mg/dL (ref 0.50–1.35)
Glucose, Bld: 84 mg/dL (ref 70–99)
Potassium: 4.4 mEq/L (ref 3.5–5.3)
Sodium: 138 mEq/L (ref 135–145)
Total Bilirubin: 1.5 mg/dL — ABNORMAL HIGH (ref 0.3–1.2)
Total Protein: 7.7 g/dL (ref 6.0–8.3)

## 2012-04-17 LAB — CBC WITH DIFFERENTIAL/PLATELET
Basophils Absolute: 0 10*3/uL (ref 0.0–0.1)
Basophils Relative: 0 % (ref 0–1)
Eosinophils Absolute: 0.1 10*3/uL (ref 0.0–0.7)
Eosinophils Relative: 1 % (ref 0–5)
HCT: 40.3 % (ref 39.0–52.0)
Hemoglobin: 14.2 g/dL (ref 13.0–17.0)
Lymphocytes Relative: 14 % (ref 12–46)
Lymphs Abs: 1.1 10*3/uL (ref 0.7–4.0)
MCH: 29.8 pg (ref 26.0–34.0)
MCHC: 35.2 g/dL (ref 30.0–36.0)
MCV: 84.7 fL (ref 78.0–100.0)
Monocytes Absolute: 0.5 10*3/uL (ref 0.1–1.0)
Monocytes Relative: 6 % (ref 3–12)
Neutro Abs: 6.1 10*3/uL (ref 1.7–7.7)
Neutrophils Relative %: 79 % — ABNORMAL HIGH (ref 43–77)
Platelets: 245 10*3/uL (ref 150–400)
RBC: 4.76 MIL/uL (ref 4.22–5.81)
RDW: 13.7 % (ref 11.5–15.5)
WBC: 7.7 10*3/uL (ref 4.0–10.5)

## 2012-04-17 LAB — POCT UA - MICROSCOPIC ONLY
Bacteria, U Microscopic: NEGATIVE
Casts, Ur, LPF, POC: NEGATIVE
Crystals, Ur, HPF, POC: NEGATIVE
RBC, urine, microscopic: NEGATIVE
Yeast, UA: NEGATIVE

## 2012-04-17 LAB — POCT URINALYSIS DIPSTICK
Bilirubin, UA: NEGATIVE
Blood, UA: NEGATIVE
Glucose, UA: NEGATIVE
Ketones, UA: NEGATIVE
Leukocytes, UA: NEGATIVE
Nitrite, UA: NEGATIVE
Protein, UA: NEGATIVE
Spec Grav, UA: 1.01
Urobilinogen, UA: 0.2
pH, UA: 7

## 2012-04-17 LAB — LIPID PANEL
Cholesterol: 218 mg/dL — ABNORMAL HIGH (ref 0–200)
HDL: 49 mg/dL (ref >39)
LDL Cholesterol: 153 mg/dL — ABNORMAL HIGH (ref 0–99)
Total CHOL/HDL Ratio: 4.4 Ratio
Triglycerides: 80 mg/dL (ref <150)
VLDL: 16 mg/dL (ref 0–40)

## 2012-04-17 LAB — IFOBT (OCCULT BLOOD): IFOBT: NEGATIVE

## 2012-04-17 LAB — TSH: TSH: 1.437 u[IU]/mL (ref 0.350–4.500)

## 2012-04-17 MED ORDER — SILDENAFIL CITRATE 100 MG PO TABS: 50.0000 mg | ORAL_TABLET | Freq: Every day | ORAL | Status: DC | PRN

## 2012-04-17 NOTE — Patient Instructions (Signed)

## 2012-04-17 NOTE — Progress Notes (Signed)
Subjective:    Patient ID: Chris Welch, male    DOB: 1946/08/22, 66 y.o.   MRN: [id number]  HPI This 66 y.o. Male presents for CPE.  He notes two episodes of mild shingles since having received the vaccine.  Review of Systems  Constitutional: Positive for fatigue. Negative for fever, chills, diaphoresis, activity change, appetite change and unexpected weight change.  HENT: Positive for hearing loss, ear pain, congestion, trouble swallowing, voice change (to have a vocal cord implant to improve this problem), tinnitus and ear discharge. Negative for nosebleeds, sore throat, facial swelling, rhinorrhea, sneezing, drooling, mouth sores, neck pain, neck stiffness, dental problem, postnasal drip and sinus pressure.        Currently undergoing evaluation and treatment with ENT  Eyes: Negative.   Respiratory: Positive for cough and choking (not worse than baseline). Negative for apnea, chest tightness, shortness of breath, wheezing and stridor.   Cardiovascular: Negative.   Gastrointestinal: Negative.   Genitourinary: Negative.   Musculoskeletal: Negative.   Skin: Negative.   Neurological: Negative.   Hematological: Negative for adenopathy. Bruises/bleeds easily.  Psychiatric/Behavioral: Positive for dysphoric mood. Negative for suicidal ideas, hallucinations, behavioral problems, confusion, disturbed wake/sleep cycle, self-injury, decreased concentration and agitation. The patient is not nervous/anxious and is not hyperactive.       Past Medical History  Diagnosis Date  . Depression   . GERD (gastroesophageal reflux disease)   . Enlarged prostate   . 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 Ear infection ear infection lasting past 2 months, still ongoing, pt on antibiotics & drops. pt states "ear infection has eaten holes through bilateral ear drums,  I am hard of hearing"  . Vocal cord paralysis     Right  . Diverticulosis   . Diverticulitis      Past Surgical History  Procedure Date  . Tumor right side of neck 12/2006    resection of R cervical paraganglioma  . Deep neck lymph node biopsy / excision   . Knee arthrotomy     has had 5 surgeries right knee  . Right inguinal hernia   . Cholecystectomy     Prior to Admission medications   Medication Sig Start Date End Date Taking? Authorizing Provider  buPROPion (WELLBUTRIN XL) 150 MG 24 hr tablet Take 3 tablets (450 mg total) by mouth daily. 10/31/11  Yes Crimson Dubberly S Jarman Litton, PA-C  dutasteride (AVODART) 0.5 MG capsule Take 0.5 mg by mouth daily.     Yes Historical Provider, MD  fluticasone (FLONASE) 50 MCG/ACT nasal spray Place 2 sprays into the nose daily.   Yes Historical Provider, MD  glycopyrrolate (ROBINUL) 1 MG tablet Take 1 mg by mouth 3 (three) times daily. Takes 2 tablets 3 times a day.    Yes Historical Provider, MD  naproxen sodium (ANAPROX) 220 MG tablet Take 220 mg by mouth daily. Takes 2 tablets daily    Yes Historical Provider, MD  omeprazole (PRILOSEC) 20 MG capsule Take 20 mg by mouth daily.     Yes Historical Provider, MD  Tamsulosin HCl (FLOMAX) 0.4 MG CAPS Take 0.4 mg by mouth. Takes 2 tablets daily    Yes Historical Provider, MD  traZODone (DESYREL) 50 MG tablet Take 50 mg by mouth at bedtime.     Yes Historical Provider, MD  lisinopril (PRINIVIL,ZESTRIL) 10 MG tablet  04/02/12   Historical Provider, MD    Allergies  Allergen Reactions  . Penicillins Other (See Comments)  Ulcers on eyes   History   Social History  . Marital Status: Married    Spouse Name: Junious Dresser Salehi    Number of Children: 3   Occupational History  . Machinist     [employer]   Social History Main Topics  . Smoking status: Never Smoker   . Smokeless tobacco: Never Used  . Alcohol Use: No     rare alcohol intake  . Drug Use: No  . Sexually Active: Yes -- Male partner(s)    Family History  Problem Relation Age of Onset  . Lung cancer Brother       Objective:    Physical Exam  Vitals reviewed. Constitutional: He is oriented to person, place, and time. Vital signs are normal. He appears well-developed and well-nourished.  Non-toxic appearance. He does not have a sickly appearance. He does not appear ill. No distress.  HENT:  Head: Normocephalic and atraumatic. No trismus in the jaw.  Right Ear: Hearing, tympanic membrane, external ear and ear canal normal.  Left Ear: There is drainage and swelling.  Nose: Nose normal.  Mouth/Throat: Uvula is midline, oropharynx is clear and moist and mucous membranes are normal. He does not have dentures. No oral lesions. Normal dentition. No dental abscesses, uvula swelling, lacerations or dental caries.       Mild deviation of the tongue, though improved from previous visits.  Wears bilateral amplifiers, unable to wear the left due to swelling of the canal.  TM is not visible due to thick drainage at the base of the canal.  Eyes: Conjunctivae and EOM are normal. Pupils are equal, round, and reactive to light. Right eye exhibits no discharge. Left eye exhibits no discharge. No scleral icterus.  Fundoscopic exam:      The right eye shows no arteriolar narrowing, no AV nicking, no exudate, no hemorrhage and no papilledema. The right eye shows red reflex.The right eye shows no venous pulsations.      The left eye shows no arteriolar narrowing, no AV nicking, no exudate, no hemorrhage and no papilledema. The left eye shows red reflex.The left eye shows no venous pulsations. Neck: Normal range of motion, full passive range of motion without pain and phonation normal. Neck supple. No spinous process tenderness and no muscular tenderness present. No rigidity. No tracheal deviation, no edema, no erythema and normal range of motion present. No thyromegaly present.    Cardiovascular: Normal rate, regular rhythm, S1 normal, S2 normal, normal heart sounds, intact distal pulses and normal pulses.  Exam reveals no gallop and no  friction rub.   No murmur heard. Pulmonary/Chest: Effort normal and breath sounds normal. No respiratory distress. He has no wheezes. He has no rales.  Abdominal: Soft. Normal appearance and bowel sounds are normal. He exhibits no distension and no mass. There is no hepatosplenomegaly. There is no tenderness. There is no rebound and no guarding. No hernia. Hernia confirmed negative in the right inguinal area and confirmed negative in the left inguinal area.  Genitourinary: Rectum normal, prostate normal, testes normal and penis normal. Guaiac negative stool. No phimosis, paraphimosis, hypospadias, penile erythema or penile tenderness. No discharge found.  Musculoskeletal: Normal range of motion. He exhibits no edema and no tenderness.       Right shoulder: Normal.       Left shoulder: Normal.       Right elbow: Normal.      Left elbow: Normal.       Right wrist: Normal.  Left wrist: Normal.       Right hip: Normal.       Left hip: Normal.       Right knee: Normal.       Left knee: Normal.       Right ankle: Normal. Achilles tendon normal.       Left ankle: Normal. Achilles tendon normal.       Cervical back: Normal. He exhibits normal range of motion, no tenderness, no bony tenderness, no swelling, no edema, no deformity, no laceration, no pain, no spasm and normal pulse.       Thoracic back: Normal.       Lumbar back: Normal.       Right upper arm: Normal.       Left upper arm: Normal.       Right forearm: Normal.       Left forearm: Normal.       Right hand: Normal.       Left hand: Normal.       Right upper leg: Normal.       Left upper leg: Normal.       Right lower leg: Normal.       Left lower leg: Normal.       Right foot: Normal.       Left foot: Normal.  Lymphadenopathy:       Head (right side): No submental, no submandibular, no tonsillar, no preauricular, no posterior auricular and no occipital adenopathy present.       Head (left side): No submental, no  submandibular, no tonsillar, no preauricular, no posterior auricular and no occipital adenopathy present.    He has no cervical adenopathy.       Right: No inguinal and no supraclavicular adenopathy present.       Left: No inguinal and no supraclavicular adenopathy present.  Neurological: He is alert and oriented to person, place, and time. He has normal strength and normal reflexes. He displays no tremor. No cranial nerve deficit. He exhibits normal muscle tone. Coordination and gait normal.  Skin: Skin is warm, dry and intact. No abrasion, no ecchymosis, no laceration, no lesion and no rash noted. He is not diaphoretic. No cyanosis or erythema. No pallor. Nails show no clubbing.  Psychiatric: He has a normal mood and affect. His speech is normal and behavior is normal. Judgment and thought content normal. Cognition and memory are normal.       Assessment & Plan:   1. Routine general medical examination at a health care facility  POCT UA - Microscopic Only, POCT urinalysis dipstick, CBC with Differential, Comprehensive metabolic panel, Lipid panel, TSH  2. Need for Tdap vaccination  Tdap vaccine greater than or equal to 7yo IM  3. Need for pneumococcal vaccination  Pneumococcal polysaccharide vaccine 23-valent greater than or equal to 2yo subcutaneous/IM  4. Screening for colon cancer  IFOBT POC (occult bld, rslt in office)  5. Screening for prostate cancer  PSA  6. Depression  TSH  7. BPH (benign prostatic hyperplasia)  Continue Avodart and Flomax  8. AR (allergic rhinitis)  Continue Flonase  9. Otorrhea  Follow-up with Dr. Pollyann Kennedy as planned  10. ED (erectile dysfunction)  sildenafil (VIAGRA) 100 MG tablet   RTC 4 months.

## 2012-04-18 ENCOUNTER — Encounter: Payer: Self-pay | Admitting: Physician Assistant

## 2012-04-18 LAB — PSA: PSA: 0.99 ng/mL (ref <=4.00)

## 2012-04-23 ENCOUNTER — Telehealth: Payer: Self-pay

## 2012-04-23 NOTE — Telephone Encounter (Signed)
Pt state he was seen in office last week by Chelle and was told to call us to request rx for bp medication. [facility] [address]

## 2012-04-23 NOTE — Telephone Encounter (Signed)
Have tried to call him for specifics, unable to leave mssg.

## 2012-04-24 MED ORDER — PRAVASTATIN SODIUM 20 MG PO TABS: 20.0000 mg | ORAL_TABLET | Freq: Every day | ORAL | Status: DC

## 2012-04-24 NOTE — Telephone Encounter (Signed)
I called pt he wants cholesterol medications as suggested by Chelle in recent letter he rc'd is pravastatin 20 mg/ he needs labs repeated in 3 months. This is sent in for him for 2 mo supply and he will follow up in 3 months for repeat labs.

## 2012-05-10 ENCOUNTER — Other Ambulatory Visit: Payer: Self-pay

## 2012-05-10 MED ORDER — TAMSULOSIN HCL 0.4 MG PO CAPS: 0.4000 mg | ORAL_CAPSULE | Freq: Every day | ORAL | Status: DC

## 2012-05-11 ENCOUNTER — Encounter: Payer: Self-pay | Admitting: Physician Assistant

## 2012-05-11 DIAGNOSIS — J3800 Paralysis of vocal cords and larynx, unspecified: Secondary | ICD-10-CM

## 2012-05-11 NOTE — Assessment & Plan Note (Signed)
04/25/2012 Transnasal Laryngoscopy with Videostroboscopy by Chris Welch. 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

## 2012-05-15 ENCOUNTER — Telehealth: Payer: Self-pay

## 2012-05-15 MED ORDER — DUTASTERIDE 0.5 MG PO CAPS: 0.5000 mg | ORAL_CAPSULE | Freq: Every day | ORAL | Status: DC

## 2012-05-15 NOTE — Telephone Encounter (Signed)
Faxed over the requested records to [facility] per patient request.

## 2012-05-15 NOTE — Telephone Encounter (Signed)
[facility] STATES WE REFERRED PT AND HE IS HAVING SURGERY, THEY NEED RECORDS,LABS FAXED TO THEM PLEASE FAX TO [phone] AND THE PHONE NUMBER IS [phone]

## 2012-05-15 NOTE — Telephone Encounter (Signed)
Sent in a 90 day supply of Avodart.

## 2012-05-15 NOTE — Telephone Encounter (Signed)
I have called [employer] home delivery and Herbert Seta authorized the Flomax, gave verbal for this, as they did not get this last week. Quantity should be 180 on this since he takes bid They are also asking about Avodart renewal, please advise.

## 2012-05-15 NOTE — Telephone Encounter (Addendum)
BETH FROM [employer] [facility] HAVE QUESTIONS REGARDING PT'S MEDS PLEASE CALL [phone] REF NUMBER [phone] IT IS ABOUT HIS AVODART 0.5 AND FLOMAX 0.4 MEDICINE

## 2012-05-24 NOTE — Progress Notes (Signed)
This encounter was created in error - please disregard.

## 2012-06-17 ENCOUNTER — Ambulatory Visit: Payer: Managed Care, Other (non HMO)

## 2012-06-17 ENCOUNTER — Ambulatory Visit (INDEPENDENT_AMBULATORY_CARE_PROVIDER_SITE_OTHER): Payer: Managed Care, Other (non HMO) | Admitting: Family Medicine

## 2012-06-17 VITALS — BP 122/74 | HR 80 | Temp 98.2°F | Resp 17 | Ht 73.0 in | Wt 206.0 lb

## 2012-06-17 DIAGNOSIS — M25519 Pain in unspecified shoulder: Secondary | ICD-10-CM

## 2012-06-17 DIAGNOSIS — M542 Cervicalgia: Secondary | ICD-10-CM
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
## 2012-07-09 ENCOUNTER — Ambulatory Visit (INDEPENDENT_AMBULATORY_CARE_PROVIDER_SITE_OTHER): Payer: Managed Care, Other (non HMO)

## 2012-07-09 ENCOUNTER — Other Ambulatory Visit: Payer: Self-pay | Admitting: Physician Assistant

## 2012-07-09 DIAGNOSIS — Z23 Encounter for immunization: Secondary | ICD-10-CM

## 2012-07-17 ENCOUNTER — Other Ambulatory Visit: Payer: Self-pay | Admitting: Physician Assistant

## 2012-08-12 ENCOUNTER — Other Ambulatory Visit: Payer: Self-pay | Admitting: Physician Assistant

## 2012-08-21 ENCOUNTER — Ambulatory Visit (INDEPENDENT_AMBULATORY_CARE_PROVIDER_SITE_OTHER): Payer: Managed Care, Other (non HMO) | Admitting: Physician Assistant

## 2012-08-21 ENCOUNTER — Encounter: Payer: Self-pay | Admitting: Physician Assistant

## 2012-08-21 VITALS — BP 140/92 | HR 75 | Temp 97.5°F | Resp 16 | Ht 72.0 in | Wt 206.0 lb

## 2012-08-21 DIAGNOSIS — E785 Hyperlipidemia, unspecified: Secondary | ICD-10-CM | POA: Insufficient documentation

## 2012-08-21 DIAGNOSIS — F329 Major depressive disorder, single episode, unspecified: Secondary | ICD-10-CM

## 2012-08-21 DIAGNOSIS — N138 Other obstructive and reflux uropathy: Secondary | ICD-10-CM

## 2012-08-21 DIAGNOSIS — R351 Nocturia: Secondary | ICD-10-CM | POA: Insufficient documentation

## 2012-08-21 DIAGNOSIS — N401 Enlarged prostate with lower urinary tract symptoms: Secondary | ICD-10-CM | POA: Insufficient documentation

## 2012-08-21 DIAGNOSIS — F32A Depression, unspecified: Secondary | ICD-10-CM

## 2012-08-21 LAB — LIPID PANEL
Cholesterol: 166 mg/dL (ref 0–200)
HDL: 56 mg/dL (ref >39)
LDL Cholesterol: 102 mg/dL — ABNORMAL HIGH (ref 0–99)
Total CHOL/HDL Ratio: 3 Ratio
Triglycerides: 41 mg/dL (ref <150)
VLDL: 8 mg/dL (ref 0–40)

## 2012-08-21 LAB — COMPREHENSIVE METABOLIC PANEL
ALT: 18 U/L (ref 0–53)
AST: 18 U/L (ref 0–37)
Albumin: 4.3 g/dL (ref 3.5–5.2)
Alkaline Phosphatase: 56 U/L (ref 39–117)
BUN: 13 mg/dL (ref 6–23)
CO2: 29 mEq/L (ref 19–32)
Calcium: 9.4 mg/dL (ref 8.4–10.5)
Chloride: 104 mEq/L (ref 96–112)
Creat: 0.96 mg/dL (ref 0.50–1.35)
Glucose, Bld: 97 mg/dL (ref 70–99)
Potassium: 4.3 mEq/L (ref 3.5–5.3)
Sodium: 140 mEq/L (ref 135–145)
Total Bilirubin: 0.9 mg/dL (ref 0.3–1.2)
Total Protein: 6.7 g/dL (ref 6.0–8.3)

## 2012-08-21 MED ORDER — TAMSULOSIN HCL 0.4 MG PO CAPS: 0.4000 mg | ORAL_CAPSULE | Freq: Every day | ORAL | Status: DC

## 2012-08-21 NOTE — Progress Notes (Signed)
Subjective:    Patient ID: Chris Welch, male    DOB: February 19, 1946, 66 y.o.   MRN: [id number]  HPI This 66 y.o. male presents for evaluation of:  [name] Kitchen Depression  . BPH associated with nocturia  . Hyperlipidemia   Considering retirement in 10/2012, though isn't sure what he'll do instead.  "I've never not worked." He has very mixed emotions. Needs a refill of Flomax. Notes the Wellbutrin isn't as effective as it once was.  Past Medical History  Diagnosis Date  . Depression   . GERD (gastroesophageal reflux disease)   . Enlarged prostate   . 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 Ear infection ear infection lasting past 2 months, still ongoing, pt on antibiotics & drops. pt states "ear infection has eaten holes through bilateral ear drums,  I am hard of hearing"  . Vocal cord paralysis     Right  . Diverticulosis   . Diverticulitis     Past Surgical History  Procedure Date  . Tumor right side of neck 12/2006    resection of R cervical paraganglioma  . Deep neck lymph node biopsy / excision   . Knee arthrotomy     has had 5 surgeries right knee  . Right inguinal hernia   . Cholecystectomy     Prior to Admission medications   Medication Sig Start Date End Date Taking? Authorizing Provider  buPROPion (WELLBUTRIN XL) 150 MG 24 hr tablet TAKE 3 TABLETS (450 MG TOTAL) BY MOUTH DAILY. 07/17/12  Yes Morrell Riddle, PA-C  dutasteride (AVODART) 0.5 MG capsule Take 1 capsule (0.5 mg total) by mouth daily. 05/15/12  Yes Ryan M Dunn, PA-C  fluticasone (FLONASE) 50 MCG/ACT nasal spray Place 2 sprays into the nose daily.   Yes Historical Provider, MD  glycopyrrolate (ROBINUL) 1 MG tablet Take 1 mg by mouth 3 (three) times daily. Takes 2 tablets 3 times a day.    Yes Historical Provider, MD  lisinopril (PRINIVIL,ZESTRIL) 10 MG tablet  04/02/12  Yes Historical Provider, MD  pravastatin (PRAVACHOL) 20 MG tablet TAKE 1 TABLET (20 MG TOTAL) BY MOUTH DAILY.  08/12/12  Yes Keary Hanak S Niaya Hickok, PA-C  ranitidine (ZANTAC) 150 MG tablet Take 150 mg by mouth 2 (two) times daily.   Yes Historical Provider, MD  traZODone (DESYREL) 50 MG tablet Take 50 mg by mouth at bedtime.     Yes Historical Provider, MD  sildenafil (VIAGRA) 100 MG tablet Take 0.5-1 tablets (50-100 mg total) by mouth daily as needed for erectile dysfunction. 04/17/12 05/17/12  Athleen Feltner S Maguadalupe Lata, PA-C  Tamsulosin HCl (FLOMAX) 0.4 MG CAPS Take 1 capsule (0.4 mg total) by mouth daily. Takes 2 tablets daily 05/10/12   Nelva Nay, PA-C    Allergies  Allergen Reactions  . Penicillins Other (See Comments)    Ulcers on eyes    History   Social History  . Marital Status: Married    Spouse Name: Junious Dresser Kerth    Number of Children: 4  . Years of Education: 16   Occupational History  . Machinist     [employer]   Social History Main Topics  . Smoking status: Never Smoker   . Smokeless tobacco: Never Used  . Alcohol Use: No     Comment: rare alcohol intake  . Drug Use: No  . Sexually Active: Yes -- Male partner(s)   Other Topics Concern  . Not on file   Social History Narrative  Lives with his wife.  Three children are grown.  Younger daughter will graduate from [employer] in 01/2013.  Both sons and oldest daughter are in [place].    Family History  Problem Relation Age of Onset  . Lung cancer Brother     Review of Systems As above.  No chest pain, SOB, HA, dizziness, vision change, N/V, diarrhea, constipation, dysuria, urinary urgency or frequency, myalgias, arthralgias or rash. Dry mouth persists.  Occasional cough, much better since eliminating ice cream.    Objective:   Physical Exam  Blood pressure 140/92, pulse 75, temperature 97.5 F (36.4 C), temperature source Oral, resp. rate 16, height 6' (1.829 m), weight 206 lb (93.441 kg), SpO2 99.00%. Body mass index is 27.94 kg/(m^2). Well-developed, well nourished WM who is awake, alert and oriented, in NAD. HEENT:  [place]/AT, sclera and conjunctiva are clear.  EAC are patent, TMs are normal in appearance. Nasal mucosa is pink and moist. OP is clear. Neck: supple, non-tender, no lymphadenopathy, thyromegaly. Heart: RRR, no murmur Lungs: normal effort, CTA Extremities: no cyanosis, clubbing or edema. Skin: warm and dry without rash. Psychologic: good mood and appropriate affect, normal speech and behavior.     Assessment & Plan:   1. BPH associated with nocturia  Tamsulosin HCl (FLOMAX) 0.4 MG CAPS, He will contact his urologist to clarify if he is to take BOTH Flomax and Avodart.  2. Depression  Start looking for things that bring joy-especially service to others.  Play more golf. Consider increasing Trazodone dose.  3. Hyperlipidemia  Comprehensive metabolic panel, Lipid panel   RTC 3-6 months, sooner PRN.

## 2012-08-21 NOTE — Patient Instructions (Signed)
Start doing more things that make you happy.  Play more golf.  Provide service to your community.

## 2012-08-22 ENCOUNTER — Encounter: Payer: Self-pay | Admitting: Physician Assistant

## 2012-08-25 ENCOUNTER — Other Ambulatory Visit: Payer: Self-pay | Admitting: Physician Assistant

## 2012-09-08 ENCOUNTER — Other Ambulatory Visit: Payer: Self-pay | Admitting: Physician Assistant

## 2012-10-03 ENCOUNTER — Other Ambulatory Visit: Payer: Self-pay | Admitting: Family Medicine

## 2012-10-15 ENCOUNTER — Other Ambulatory Visit: Payer: Self-pay | Admitting: Physician Assistant

## 2012-11-03 ENCOUNTER — Other Ambulatory Visit: Payer: Self-pay | Admitting: Physician Assistant

## 2012-12-09 ENCOUNTER — Other Ambulatory Visit: Payer: Self-pay | Admitting: Family Medicine

## 2012-12-11 ENCOUNTER — Telehealth: Payer: Self-pay

## 2012-12-11 MED ORDER — LISINOPRIL 10 MG PO TABS: 10.0000 mg | ORAL_TABLET | Freq: Every day | ORAL | Status: DC

## 2012-12-11 NOTE — Telephone Encounter (Signed)
NEEDS REFILL ON BP MEDS (LISINOPRIL - HE THINKS). WAS DENIED B/C NEEDS OV.  HE MADE APPT W.DR DAUB FOR 4/15, BUT IS OUT NOW. CAN HE GET REFILL FOR 30 DAY?  [facility] [address]

## 2012-12-11 NOTE — Telephone Encounter (Signed)
Sent in for him. Called him to advise.

## 2012-12-12 ENCOUNTER — Other Ambulatory Visit: Payer: Self-pay | Admitting: Physician Assistant

## 2013-01-01 ENCOUNTER — Ambulatory Visit: Payer: Managed Care, Other (non HMO) | Admitting: Emergency Medicine

## 2013-01-01 MED ORDER — LISINOPRIL 10 MG PO TABS: 10.0000 mg | ORAL_TABLET | Freq: Every day | ORAL | Status: DC

## 2013-01-02 NOTE — Progress Notes (Signed)
  Subjective:    Patient ID: Chris Welch, male    DOB: 1946/04/08, 67 y.o.   MRN: [id number]  HPI not seen during this visit. He is to return on Monday for checkup.    Review of Systems     Objective:   Physical Exam        Assessment & Plan:  I did refill his medicines so he can have them prior to his appointment .

## 2013-01-07 ENCOUNTER — Ambulatory Visit (INDEPENDENT_AMBULATORY_CARE_PROVIDER_SITE_OTHER): Payer: Managed Care, Other (non HMO) | Admitting: Emergency Medicine

## 2013-01-07 ENCOUNTER — Encounter: Payer: Self-pay | Admitting: Emergency Medicine

## 2013-01-07 VITALS — BP 122/80 | HR 86 | Temp 97.8°F | Resp 16 | Ht 73.5 in | Wt 208.0 lb

## 2013-01-07 DIAGNOSIS — E785 Hyperlipidemia, unspecified: Secondary | ICD-10-CM

## 2013-01-07 DIAGNOSIS — G4700 Insomnia, unspecified: Secondary | ICD-10-CM

## 2013-01-07 DIAGNOSIS — R21 Rash and other nonspecific skin eruption: Secondary | ICD-10-CM

## 2013-01-07 DIAGNOSIS — I10 Essential (primary) hypertension: Secondary | ICD-10-CM

## 2013-01-07 LAB — COMPREHENSIVE METABOLIC PANEL
ALT: 15 U/L (ref 0–53)
AST: 15 U/L (ref 0–37)
Albumin: 4.5 g/dL (ref 3.5–5.2)
Alkaline Phosphatase: 63 U/L (ref 39–117)
BUN: 14 mg/dL (ref 6–23)
CO2: 28 mEq/L (ref 19–32)
Calcium: 10 mg/dL (ref 8.4–10.5)
Chloride: 100 mEq/L (ref 96–112)
Creat: 1.03 mg/dL (ref 0.50–1.35)
Glucose, Bld: 92 mg/dL (ref 70–99)
Potassium: 4.1 mEq/L (ref 3.5–5.3)
Sodium: 135 mEq/L (ref 135–145)
Total Bilirubin: 1.1 mg/dL (ref 0.3–1.2)
Total Protein: 7.5 g/dL (ref 6.0–8.3)

## 2013-01-07 LAB — POCT SKIN KOH: Skin KOH, POC: NEGATIVE

## 2013-01-07 LAB — LIPID PANEL
Cholesterol: 181 mg/dL (ref 0–200)
HDL: 48 mg/dL (ref >39)
LDL Cholesterol: 107 mg/dL — ABNORMAL HIGH (ref 0–99)
Total CHOL/HDL Ratio: 3.8 Ratio
Triglycerides: 131 mg/dL (ref <150)
VLDL: 26 mg/dL (ref 0–40)

## 2013-01-07 MED ORDER — BETAMETHASONE DIPROPIONATE AUG 0.05 % EX CREA: TOPICAL_CREAM | Freq: Two times a day (BID) | CUTANEOUS | Status: DC

## 2013-01-07 MED ORDER — TRAZODONE HCL 50 MG PO TABS: 50.0000 mg | ORAL_TABLET | Freq: Every day | ORAL | Status: DC

## 2013-01-07 NOTE — Progress Notes (Signed)
  Subjective:    Patient ID: Chris Welch, male    DOB: [birth year], 67 y.o.   MRN: [id number]  HPI patient here for followup of depression high blood pressure and high cholesterol. He battles with vocal cord paralysis and recurrent risk of aspiration. This follows a surgery he had done for a mass on the right side of his neck    Review of Systems     Objective:   Physical Exam patient is alert and cooperative he does have a husky voice. On throat examination there is a deviation of the soft palate. On the right side of neck there is a scar and a half by half centimeter firm nodule just beneath the skin patient states this has been there for a long time with no change. Chest was clear to auscultation and percussion. Cardiac exam was unremarkable.        Assessment & Plan:  Routine labs were done today. No change in medication

## 2013-01-12 ENCOUNTER — Other Ambulatory Visit: Payer: Self-pay | Admitting: Physician Assistant

## 2013-01-27 ENCOUNTER — Other Ambulatory Visit: Payer: Self-pay | Admitting: Physician Assistant

## 2013-02-07 ENCOUNTER — Other Ambulatory Visit: Payer: Self-pay | Admitting: Physician Assistant

## 2013-02-13 ENCOUNTER — Other Ambulatory Visit: Payer: Self-pay | Admitting: Physician Assistant

## 2013-02-27 ENCOUNTER — Ambulatory Visit (INDEPENDENT_AMBULATORY_CARE_PROVIDER_SITE_OTHER): Payer: 59 | Admitting: Family Medicine

## 2013-02-27 VITALS — BP 109/71 | HR 80 | Temp 98.0°F | Resp 16 | Ht 74.5 in | Wt 209.0 lb

## 2013-02-27 DIAGNOSIS — F329 Major depressive disorder, single episode, unspecified: Secondary | ICD-10-CM

## 2013-02-27 DIAGNOSIS — R490 Dysphonia: Secondary | ICD-10-CM

## 2013-02-27 DIAGNOSIS — F32A Depression, unspecified: Secondary | ICD-10-CM

## 2013-02-27 DIAGNOSIS — R109 Unspecified abdominal pain: Secondary | ICD-10-CM

## 2013-02-27 LAB — POCT UA - MICROSCOPIC ONLY
Bacteria, U Microscopic: NEGATIVE
Casts, Ur, LPF, POC: NEGATIVE
Crystals, Ur, HPF, POC: NEGATIVE
Mucus, UA: NEGATIVE
Yeast, UA: NEGATIVE

## 2013-02-27 LAB — POCT URINALYSIS DIPSTICK
Bilirubin, UA: NEGATIVE
Blood, UA: NEGATIVE
Glucose, UA: NEGATIVE
Leukocytes, UA: NEGATIVE
Nitrite, UA: NEGATIVE
Protein, UA: NEGATIVE
Spec Grav, UA: 1.02
Urobilinogen, UA: 1
pH, UA: 6.5

## 2013-02-27 MED ORDER — PREDNISONE 20 MG PO TABS: ORAL_TABLET | ORAL | Status: DC

## 2013-02-27 MED ORDER — ALBUTEROL SULFATE HFA 108 (90 BASE) MCG/ACT IN AERS: 2.0000 | INHALATION_SPRAY | Freq: Four times a day (QID) | RESPIRATORY_TRACT | Status: DC | PRN

## 2013-02-27 MED ORDER — BUPROPION HCL ER (XL) 150 MG PO TB24: 450.0000 mg | ORAL_TABLET | ORAL | Status: DC

## 2013-02-27 NOTE — Progress Notes (Signed)
This is a 67 year old gentleman who just retired last month from [employer]. He comes in with 2 weeks of left flank pain. The pain is described as an ache and it is worse with straight leg raising or bending over. Patient suspects that the pain may be muscular but he want to make sure it was not coming from his kidneys or somewhere else.  Patient is not having any blood in his urine, fevers, radiation of pain, abdominal pain.  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 He suspects that because of the cough, he may pulled a muscle.  He is due for a physical in 2 months. He usually sees Dr. Cleta Alberts.  Objective: No acute distress Chest: Clear Abdomen: Soft nontender without HSM Palpation of left flank reveals no significant tenderness or masses. Skin: No rash Straight-leg raising: Mildly positive on left only. He is able to raise the left leg to almost 90. There is no muscle wasting. Results for orders placed in visit on 02/27/13  POCT URINALYSIS DIPSTICK      Result Value Range   Color, UA yellow     Clarity, UA clear     Glucose, UA neg     Bilirubin, UA neg     Ketones, UA trace     Spec Grav, UA 1.020     Blood, UA neg     pH, UA 6.5     Protein, UA neg     Urobilinogen, UA 1.0     Nitrite, UA neg     Leukocytes, UA Negative    POCT UA - MICROSCOPIC ONLY      Result Value Range   WBC, Ur, HPF, POC 0-1     RBC, urine, microscopic 0-1     Bacteria, U Microscopic neg     Mucus, UA neg     Epithelial cells, urine per micros 0-3     Crystals, Ur, HPF, POC neg     Casts, Ur, LPF, POC neg     Yeast, UA neg       Assessment:  This appears to be a muscular problem.   plan: Prednisone 20 mg 2 tablets daily with food Call if not better in 48 hours

## 2013-03-04 ENCOUNTER — Ambulatory Visit (INDEPENDENT_AMBULATORY_CARE_PROVIDER_SITE_OTHER): Payer: 59 | Admitting: Emergency Medicine

## 2013-03-04 ENCOUNTER — Ambulatory Visit: Payer: 59

## 2013-03-04 VITALS — BP 156/100 | HR 82 | Temp 97.9°F | Resp 18 | Ht 73.0 in | Wt 209.6 lb

## 2013-03-04 DIAGNOSIS — M549 Dorsalgia, unspecified: Secondary | ICD-10-CM

## 2013-03-04 DIAGNOSIS — M5410 Radiculopathy, site unspecified: Secondary | ICD-10-CM

## 2013-03-04 DIAGNOSIS — IMO0002 Reserved for concepts with insufficient information to code with codable children: Secondary | ICD-10-CM

## 2013-03-04 DIAGNOSIS — M5137 Other intervertebral disc degeneration, lumbosacral region: Secondary | ICD-10-CM

## 2013-03-04 MED ORDER — HYDROCODONE-ACETAMINOPHEN 5-325 MG PO TABS: 1.0000 | ORAL_TABLET | Freq: Four times a day (QID) | ORAL | Status: DC | PRN

## 2013-03-04 MED ORDER — GABAPENTIN 100 MG PO CAPS: ORAL_CAPSULE | ORAL | Status: DC

## 2013-03-04 NOTE — Progress Notes (Signed)
  Subjective:    Patient ID: Chris Welch, male    DOB: 1946/01/06, 67 y.o.   MRN: [id number]  HPI patient was in his usual state of health until approximately 2-1/2 weeks ago when he developed pain in the left side of his back. It is located in the left SI joint. It radiates around the front of his leg and down to the inside portion of his left knee. He walks with a limp he denies any injury to his back. He saw Dr.Lauenstein  5 days ago was started on prednisone 40 mg daily at that time. Despite this he continues to have significant back pain    Review of Systems     Objective:   Physical Exam there is tenderness over L5-S1. Deep tendon reflexes are present and symmetrical there is possibly some weakness of dorsiflexion of the left foot. There is no rash noted. There is a significant scar over the right knee.  [facility] reading (PRIMARY) by  Dr. Cleta Alberts there is multilevel degenerative disc disease. There is a significant degenerated disc at L4-L5        Assessment & Plan:  Patient presents with low back pain and radiculopathy involving the left leg. We'll treat with prednisone taper recheck 1 week. We'll proceed with MRI if symptoms are persistent.

## 2013-03-05 ENCOUNTER — Other Ambulatory Visit: Payer: Self-pay | Admitting: Emergency Medicine

## 2013-03-07 ENCOUNTER — Ambulatory Visit
Admission: RE | Admit: 2013-03-07 | Discharge: 2013-03-07 | Disposition: A | Discharge status: Home / Self Care | Payer: 59 | Source: Ambulatory Visit | Attending: Emergency Medicine | Admitting: Emergency Medicine

## 2013-03-07 ENCOUNTER — Telehealth: Payer: Self-pay

## 2013-03-07 ENCOUNTER — Other Ambulatory Visit: Payer: Self-pay | Admitting: Emergency Medicine

## 2013-03-07 DIAGNOSIS — M5410 Radiculopathy, site unspecified: Secondary | ICD-10-CM

## 2013-03-07 DIAGNOSIS — M549 Dorsalgia, unspecified: Secondary | ICD-10-CM

## 2013-03-07 NOTE — Telephone Encounter (Signed)
Patient came by office and stated to please schedule neuro appt with Dr Barnett Abu @ [facility] not doc previous mention to Dr Cleta Alberts.

## 2013-03-08 ENCOUNTER — Telehealth: Payer: Self-pay

## 2013-03-08 MED ORDER — TRAMADOL HCL 50 MG PO TABS: 50.0000 mg | ORAL_TABLET | Freq: Three times a day (TID) | ORAL | Status: DC | PRN

## 2013-03-08 NOTE — Telephone Encounter (Signed)
Lupita Leash do you need to know this?

## 2013-03-08 NOTE — Telephone Encounter (Signed)
Wife is advised

## 2013-03-08 NOTE — Telephone Encounter (Signed)
Patient's wife

## 2013-03-08 NOTE — Telephone Encounter (Signed)
Pts wife Junious Dresser states that the hydrocodone that pt was prescribed is causing pt to vomit and have an upset stomach. She would like to know if someone could call something else in for him. [facility] [address] [phone]

## 2013-03-08 NOTE — Telephone Encounter (Signed)
Will try tramadol instead, sent to pharmacy

## 2013-03-09 ENCOUNTER — Encounter (HOSPITAL_COMMUNITY)
Admission: AD | Disposition: A | Discharge status: Home / Self Care | Payer: Self-pay | Source: Ambulatory Visit | Attending: Neurological Surgery

## 2013-03-09 ENCOUNTER — Other Ambulatory Visit: Payer: Self-pay

## 2013-03-09 ENCOUNTER — Ambulatory Visit (HOSPITAL_COMMUNITY)
Admission: AD | Admit: 2013-03-09 | Discharge: 2013-03-10 | Disposition: A | Discharge status: Home / Self Care | Payer: 59 | Source: Ambulatory Visit | Attending: Neurological Surgery | Admitting: Neurological Surgery

## 2013-03-09 ENCOUNTER — Observation Stay (HOSPITAL_COMMUNITY): Payer: 59

## 2013-03-09 ENCOUNTER — Encounter (HOSPITAL_COMMUNITY): Payer: Self-pay | Admitting: Neurological Surgery

## 2013-03-09 ENCOUNTER — Encounter (HOSPITAL_COMMUNITY): Payer: Self-pay | Admitting: Anesthesiology

## 2013-03-09 ENCOUNTER — Ambulatory Visit (INDEPENDENT_AMBULATORY_CARE_PROVIDER_SITE_OTHER): Payer: 59 | Admitting: Emergency Medicine

## 2013-03-09 ENCOUNTER — Observation Stay (HOSPITAL_COMMUNITY): Payer: 59 | Admitting: Anesthesiology

## 2013-03-09 VITALS — BP 130/90 | HR 119 | Temp 97.9°F | Resp 18 | Ht 74.0 in | Wt 202.0 lb

## 2013-03-09 DIAGNOSIS — R1033 Periumbilical pain: Secondary | ICD-10-CM

## 2013-03-09 DIAGNOSIS — M5126 Other intervertebral disc displacement, lumbar region: Secondary | ICD-10-CM | POA: Insufficient documentation

## 2013-03-09 DIAGNOSIS — Z79899 Other long term (current) drug therapy: Secondary | ICD-10-CM | POA: Insufficient documentation

## 2013-03-09 DIAGNOSIS — R109 Unspecified abdominal pain: Secondary | ICD-10-CM

## 2013-03-09 DIAGNOSIS — M549 Dorsalgia, unspecified: Secondary | ICD-10-CM

## 2013-03-09 DIAGNOSIS — I10 Essential (primary) hypertension: Secondary | ICD-10-CM | POA: Insufficient documentation

## 2013-03-09 DIAGNOSIS — Z9889 Other specified postprocedural states: Secondary | ICD-10-CM

## 2013-03-09 HISTORY — PX: LUMBAR LAMINECTOMY/DECOMPRESSION MICRODISCECTOMY: SHX5026

## 2013-03-09 LAB — CBC WITH DIFFERENTIAL/PLATELET
Basophils Absolute: 0 10*3/uL (ref 0.0–0.1)
Basophils Relative: 0 % (ref 0–1)
Eosinophils Absolute: 0 10*3/uL (ref 0.0–0.7)
Eosinophils Relative: 0 % (ref 0–5)
HCT: 43.9 % (ref 39.0–52.0)
Hemoglobin: 15.1 g/dL (ref 13.0–17.0)
Lymphocytes Relative: 3 % — ABNORMAL LOW (ref 12–46)
Lymphs Abs: 0.4 10*3/uL — ABNORMAL LOW (ref 0.7–4.0)
MCH: 29.7 pg (ref 26.0–34.0)
MCHC: 34.4 g/dL (ref 30.0–36.0)
MCV: 86.4 fL (ref 78.0–100.0)
Monocytes Absolute: 0.2 10*3/uL (ref 0.1–1.0)
Monocytes Relative: 1 % — ABNORMAL LOW (ref 3–12)
Neutro Abs: 15.3 10*3/uL — ABNORMAL HIGH (ref 1.7–7.7)
Neutrophils Relative %: 96 % — ABNORMAL HIGH (ref 43–77)
Platelets: 210 10*3/uL (ref 150–400)
RBC: 5.08 MIL/uL (ref 4.22–5.81)
RDW: 13.2 % (ref 11.5–15.5)
WBC: 15.9 10*3/uL — ABNORMAL HIGH (ref 4.0–10.5)

## 2013-03-09 LAB — BASIC METABOLIC PANEL
BUN: 23 mg/dL (ref 6–23)
CO2: 27 mEq/L (ref 19–32)
Calcium: 10 mg/dL (ref 8.4–10.5)
Chloride: 95 mEq/L — ABNORMAL LOW (ref 96–112)
Creatinine, Ser: 0.89 mg/dL (ref 0.50–1.35)
GFR calc Af Amer: >90 mL/min (ref >90)
GFR calc non Af Amer: 87 mL/min — ABNORMAL LOW (ref >90)
Glucose, Bld: 122 mg/dL — ABNORMAL HIGH (ref 70–99)
Potassium: 5.4 mEq/L — ABNORMAL HIGH (ref 3.5–5.1)
Sodium: 134 mEq/L — ABNORMAL LOW (ref 135–145)

## 2013-03-09 LAB — MRSA PCR SCREENING: MRSA by PCR: NEGATIVE

## 2013-03-09 SURGERY — LUMBAR LAMINECTOMY/DECOMPRESSION MICRODISCECTOMY 1 LEVEL
Anesthesia: General | Site: Back | Laterality: Left | Wound class: Clean

## 2013-03-09 MED ORDER — SODIUM CHLORIDE 0.9 % IJ SOLN: 3.0000 mL | INTRAMUSCULAR | Status: DC | PRN

## 2013-03-09 MED ORDER — DIAZEPAM 5 MG PO TABS: ORAL_TABLET | ORAL | Status: DC

## 2013-03-09 MED ORDER — ROCURONIUM BROMIDE 100 MG/10ML IV SOLN
INTRAVENOUS | Status: DC | PRN
  Administered 2013-03-09: 50 mg via INTRAVENOUS

## 2013-03-09 MED ORDER — PROPOFOL 10 MG/ML IV BOLUS
INTRAVENOUS | Status: DC | PRN
  Administered 2013-03-09: 140 mg via INTRAVENOUS

## 2013-03-09 MED ORDER — ACETAMINOPHEN 325 MG PO TABS: 650.0000 mg | ORAL_TABLET | ORAL | Status: DC | PRN

## 2013-03-09 MED ORDER — BUPIVACAINE HCL (PF) 0.25 % IJ SOLN
INTRAMUSCULAR | Status: DC | PRN
  Administered 2013-03-09: 7 mL

## 2013-03-09 MED ORDER — TAMSULOSIN HCL 0.4 MG PO CAPS
0.8000 mg | ORAL_CAPSULE | Freq: Every day | ORAL | Status: DC
  Administered 2013-03-09 – 2013-03-10 (×2): 0.8 mg via ORAL
  Filled 2013-03-09 (×2): qty 2

## 2013-03-09 MED ORDER — SODIUM CHLORIDE 0.9 % IR SOLN
Status: DC | PRN
  Administered 2013-03-09: 15:00:00

## 2013-03-09 MED ORDER — PREDNISONE 20 MG PO TABS: ORAL_TABLET | ORAL | Status: DC

## 2013-03-09 MED ORDER — MORPHINE SULFATE 2 MG/ML IJ SOLN: 2.0000 mg | INTRAMUSCULAR | Status: DC | PRN

## 2013-03-09 MED ORDER — GLYCOPYRROLATE 1 MG PO TABS
1.0000 mg | ORAL_TABLET | Freq: Three times a day (TID) | ORAL | Status: DC
  Administered 2013-03-09 – 2013-03-10 (×3): 1 mg via ORAL
  Filled 2013-03-09 (×5): qty 1

## 2013-03-09 MED ORDER — DEXAMETHASONE SODIUM PHOSPHATE 4 MG/ML IJ SOLN
4.0000 mg | Freq: Four times a day (QID) | INTRAMUSCULAR | Status: DC
  Filled 2013-03-09 (×4): qty 1

## 2013-03-09 MED ORDER — 0.9 % SODIUM CHLORIDE (POUR BTL) OPTIME
TOPICAL | Status: DC | PRN
  Administered 2013-03-09: 1000 mL

## 2013-03-09 MED ORDER — ALBUTEROL SULFATE HFA 108 (90 BASE) MCG/ACT IN AERS
2.0000 | INHALATION_SPRAY | Freq: Four times a day (QID) | RESPIRATORY_TRACT | Status: DC | PRN
  Filled 2013-03-09: qty 6.7

## 2013-03-09 MED ORDER — ACETAMINOPHEN 650 MG RE SUPP: 650.0000 mg | RECTAL | Status: DC | PRN

## 2013-03-09 MED ORDER — PHENOL 1.4 % MT LIQD: 1.0000 | OROMUCOSAL | Status: DC | PRN

## 2013-03-09 MED ORDER — FENTANYL CITRATE 0.05 MG/ML IJ SOLN
INTRAMUSCULAR | Status: DC | PRN
  Administered 2013-03-09 (×2): 50 ug via INTRAVENOUS
  Administered 2013-03-09: 100 ug via INTRAVENOUS
  Administered 2013-03-09 (×6): 50 ug via INTRAVENOUS

## 2013-03-09 MED ORDER — LACTATED RINGERS IV SOLN: INTRAVENOUS | Status: DC

## 2013-03-09 MED ORDER — CELECOXIB 200 MG PO CAPS
200.0000 mg | ORAL_CAPSULE | Freq: Two times a day (BID) | ORAL | Status: DC
  Administered 2013-03-09 – 2013-03-10 (×2): 200 mg via ORAL
  Filled 2013-03-09 (×3): qty 1

## 2013-03-09 MED ORDER — ONDANSETRON HCL 4 MG/2ML IJ SOLN: 4.0000 mg | INTRAMUSCULAR | Status: DC | PRN

## 2013-03-09 MED ORDER — TRAZODONE HCL 50 MG PO TABS
50.0000 mg | ORAL_TABLET | Freq: Every day | ORAL | Status: DC
  Administered 2013-03-09: 50 mg via ORAL
  Filled 2013-03-09 (×2): qty 1

## 2013-03-09 MED ORDER — SODIUM CHLORIDE 0.9 % IV SOLN: 250.0000 mL | INTRAVENOUS | Status: DC

## 2013-03-09 MED ORDER — OXYCODONE HCL 5 MG PO TABS
5.0000 mg | ORAL_TABLET | Freq: Once | ORAL | Status: AC | PRN
Start: 2013-03-09 — End: 2013-03-09

## 2013-03-09 MED ORDER — ONDANSETRON 4 MG PO TBDP
8.0000 mg | ORAL_TABLET | Freq: Once | ORAL | Status: AC
  Administered 2013-03-09: 8 mg via ORAL

## 2013-03-09 MED ORDER — METHYLPREDNISOLONE ACETATE 80 MG/ML IJ SUSP
INTRAMUSCULAR | Status: DC | PRN
  Administered 2013-03-09: 80 mg

## 2013-03-09 MED ORDER — TRAMADOL HCL 50 MG PO TABS: 50.0000 mg | ORAL_TABLET | Freq: Four times a day (QID) | ORAL | Status: DC | PRN

## 2013-03-09 MED ORDER — FENTANYL CITRATE 0.05 MG/ML IJ SOLN
INTRAMUSCULAR | Status: DC | PRN
  Administered 2013-03-09: 100 ug via INTRAVENOUS

## 2013-03-09 MED ORDER — ONDANSETRON 4 MG PO TBDP: ORAL_TABLET | ORAL | Status: DC

## 2013-03-09 MED ORDER — LACTATED RINGERS IV SOLN
INTRAVENOUS | Status: DC | PRN
  Administered 2013-03-09 (×2): via INTRAVENOUS

## 2013-03-09 MED ORDER — HYDROMORPHONE HCL PF 1 MG/ML IJ SOLN: 0.2500 mg | INTRAMUSCULAR | Status: DC | PRN

## 2013-03-09 MED ORDER — METHOCARBAMOL 500 MG PO TABS: 500.0000 mg | ORAL_TABLET | Freq: Four times a day (QID) | ORAL | Status: DC | PRN

## 2013-03-09 MED ORDER — THROMBIN 5000 UNITS EX SOLR
OROMUCOSAL | Status: DC | PRN
  Administered 2013-03-09: 16:00:00 via TOPICAL

## 2013-03-09 MED ORDER — MENTHOL 3 MG MT LOZG: 1.0000 | LOZENGE | OROMUCOSAL | Status: DC | PRN

## 2013-03-09 MED ORDER — METHYLPREDNISOLONE SODIUM SUCC 125 MG IJ SOLR
125.0000 mg | Freq: Once | INTRAMUSCULAR | Status: AC
  Administered 2013-03-09: 125 mg via INTRAMUSCULAR

## 2013-03-09 MED ORDER — THROMBIN 20000 UNITS EX KIT
PACK | CUTANEOUS | Status: DC | PRN
  Administered 2013-03-09: 15:00:00 via TOPICAL

## 2013-03-09 MED ORDER — KETOROLAC TROMETHAMINE 60 MG/2ML IM SOLN
60.0000 mg | Freq: Once | INTRAMUSCULAR | Status: AC
  Administered 2013-03-09: 60 mg via INTRAMUSCULAR

## 2013-03-09 MED ORDER — MORPHINE SULFATE 2 MG/ML IJ SOLN: 1.0000 mg | INTRAMUSCULAR | Status: DC | PRN

## 2013-03-09 MED ORDER — BUPROPION HCL ER (XL) 300 MG PO TB24
450.0000 mg | ORAL_TABLET | ORAL | Status: DC
  Administered 2013-03-10: 450 mg via ORAL
  Filled 2013-03-09 (×2): qty 1

## 2013-03-09 MED ORDER — FLUTICASONE PROPIONATE 50 MCG/ACT NA SUSP
1.0000 | Freq: Every day | NASAL | Status: DC
  Filled 2013-03-09: qty 16

## 2013-03-09 MED ORDER — VANCOMYCIN HCL IN DEXTROSE 1-5 GM/200ML-% IV SOLN
1000.0000 mg | Freq: Once | INTRAVENOUS | Status: AC
  Administered 2013-03-09: 1000 mg via INTRAVENOUS
  Filled 2013-03-09: qty 200

## 2013-03-09 MED ORDER — ONDANSETRON HCL 4 MG/2ML IJ SOLN
INTRAMUSCULAR | Status: DC | PRN
  Administered 2013-03-09: 4 mg via INTRAVENOUS

## 2013-03-09 MED ORDER — LIDOCAINE HCL (CARDIAC) 20 MG/ML IV SOLN
INTRAVENOUS | Status: DC | PRN
  Administered 2013-03-09: 60 mg via INTRAVENOUS

## 2013-03-09 MED ORDER — POTASSIUM CHLORIDE IN NACL 20-0.9 MEQ/L-% IV SOLN
INTRAVENOUS | Status: DC
  Filled 2013-03-09 (×2): qty 1000

## 2013-03-09 MED ORDER — ACETAMINOPHEN 10 MG/ML IV SOLN
INTRAVENOUS | Status: DC | PRN
  Administered 2013-03-09: 1000 mg via INTRAVENOUS

## 2013-03-09 MED ORDER — OXYCODONE HCL 5 MG/5ML PO SOLN: 5.0000 mg | Freq: Once | ORAL | Status: AC | PRN

## 2013-03-09 MED ORDER — SODIUM CHLORIDE 0.9 % IJ SOLN: 3.0000 mL | Freq: Two times a day (BID) | INTRAMUSCULAR | Status: DC

## 2013-03-09 MED ORDER — ONDANSETRON HCL 4 MG/2ML IJ SOLN: 4.0000 mg | Freq: Once | INTRAMUSCULAR | Status: AC | PRN

## 2013-03-09 MED ORDER — LISINOPRIL 10 MG PO TABS: 10.0000 mg | ORAL_TABLET | Freq: Every day | ORAL | Status: DC

## 2013-03-09 MED ORDER — DEXAMETHASONE SODIUM PHOSPHATE 10 MG/ML IJ SOLN
10.0000 mg | Freq: Once | INTRAMUSCULAR | Status: AC
  Administered 2013-03-09: 10 mg via INTRAVENOUS
  Filled 2013-03-09: qty 1

## 2013-03-09 MED ORDER — ACETAMINOPHEN 10 MG/ML IV SOLN
1000.0000 mg | Freq: Four times a day (QID) | INTRAVENOUS | Status: DC
  Administered 2013-03-09 – 2013-03-10 (×3): 1000 mg via INTRAVENOUS
  Filled 2013-03-09 (×4): qty 100

## 2013-03-09 MED ORDER — DEXAMETHASONE 4 MG PO TABS
4.0000 mg | ORAL_TABLET | Freq: Four times a day (QID) | ORAL | Status: DC
  Administered 2013-03-09 – 2013-03-10 (×3): 4 mg via ORAL
  Filled 2013-03-09 (×7): qty 1

## 2013-03-09 MED ORDER — METHOCARBAMOL 100 MG/ML IJ SOLN
500.0000 mg | Freq: Four times a day (QID) | INTRAVENOUS | Status: DC | PRN
  Filled 2013-03-09: qty 5

## 2013-03-09 SURGICAL SUPPLY — 53 items
ADH SKN CLS LQ APL DERMABOND (GAUZE/BANDAGES/DRESSINGS) ×1
APL SKNCLS STERI-STRIP NONHPOA (GAUZE/BANDAGES/DRESSINGS) ×1
BAG DECANTER FOR FLEXI CONT (MISCELLANEOUS) ×2 IMPLANT
BENZOIN TINCTURE PRP APPL 2/3 (GAUZE/BANDAGES/DRESSINGS) ×2 IMPLANT
BUR CUTTER 6.0 RND AGGRESSIVE (BURR) ×1 IMPLANT
BUR MATCHSTICK NEURO 3.0 LAGG (BURR) ×2 IMPLANT
CANISTER SUCTION 2500CC (MISCELLANEOUS) ×2 IMPLANT
CLOTH BEACON ORANGE TIMEOUT ST (SAFETY) ×2 IMPLANT
CONT SPEC 4OZ CLIKSEAL STRL BL (MISCELLANEOUS) ×2 IMPLANT
DERMABOND ADHESIVE PROPEN (GAUZE/BANDAGES/DRESSINGS) ×1
DERMABOND ADVANCED .7 DNX6 (GAUZE/BANDAGES/DRESSINGS) IMPLANT
DRAPE LAPAROTOMY 100X72X124 (DRAPES) ×2 IMPLANT
DRAPE MICROSCOPE ZEISS OPMI (DRAPES) ×1 IMPLANT
DRAPE POUCH INSTRU U-SHP 10X18 (DRAPES) ×2 IMPLANT
DRAPE SURG 17X23 STRL (DRAPES) ×2 IMPLANT
DRESSING TELFA 8X3 (GAUZE/BANDAGES/DRESSINGS) ×2 IMPLANT
DRSG OPSITE 4X5.5 SM (GAUZE/BANDAGES/DRESSINGS) ×2 IMPLANT
DRSG OPSITE POSTOP 4X6 (GAUZE/BANDAGES/DRESSINGS) ×1 IMPLANT
DURAPREP 26ML APPLICATOR (WOUND CARE) ×2 IMPLANT
ELECT REM PT RETURN 9FT ADLT (ELECTROSURGICAL) ×2
ELECTRODE REM PT RTRN 9FT ADLT (ELECTROSURGICAL) ×1 IMPLANT
GAUZE SPONGE 4X4 16PLY XRAY LF (GAUZE/BANDAGES/DRESSINGS) IMPLANT
GLOVE BIO SURGEON STRL SZ 6.5 (GLOVE) ×1 IMPLANT
GLOVE BIO SURGEON STRL SZ7 (GLOVE) ×1 IMPLANT
GLOVE BIO SURGEON STRL SZ8 (GLOVE) ×2 IMPLANT
GOWN BRE IMP SLV AUR LG STRL (GOWN DISPOSABLE) ×1 IMPLANT
GOWN BRE IMP SLV AUR XL STRL (GOWN DISPOSABLE) ×2 IMPLANT
GOWN STRL REIN 2XL LVL4 (GOWN DISPOSABLE) IMPLANT
HEMOSTAT POWDER KIT SURGIFOAM (HEMOSTASIS) IMPLANT
KIT BASIN OR (CUSTOM PROCEDURE TRAY) ×2 IMPLANT
KIT ROOM TURNOVER OR (KITS) ×2 IMPLANT
NDL BLUNT 18X1 FOR OR ONLY (NEEDLE) IMPLANT
NDL HYPO 25X1 1.5 SAFETY (NEEDLE) ×1 IMPLANT
NDL SPNL 20GX3.5 QUINCKE YW (NEEDLE) IMPLANT
NEEDLE BLUNT 18X1 FOR OR ONLY (NEEDLE) ×2 IMPLANT
NEEDLE HYPO 25X1 1.5 SAFETY (NEEDLE) ×2 IMPLANT
NEEDLE SPNL 20GX3.5 QUINCKE YW (NEEDLE) ×2 IMPLANT
NS IRRIG 1000ML POUR BTL (IV SOLUTION) ×2 IMPLANT
PACK LAMINECTOMY NEURO (CUSTOM PROCEDURE TRAY) ×2 IMPLANT
PAD ARMBOARD 7.5X6 YLW CONV (MISCELLANEOUS) ×6 IMPLANT
RUBBERBAND STERILE (MISCELLANEOUS) ×4 IMPLANT
SPONGE SURGIFOAM ABS GEL SZ50 (HEMOSTASIS) ×2 IMPLANT
STRIP CLOSURE SKIN 1/2X4 (GAUZE/BANDAGES/DRESSINGS) ×2 IMPLANT
SUT VIC AB 0 CT1 18XCR BRD8 (SUTURE) ×1 IMPLANT
SUT VIC AB 0 CT1 8-18 (SUTURE) ×2
SUT VIC AB 2-0 CP2 18 (SUTURE) ×2 IMPLANT
SUT VIC AB 3-0 SH 8-18 (SUTURE) ×2 IMPLANT
SYR 20ML ECCENTRIC (SYRINGE) ×2 IMPLANT
SYR 5ML LL (SYRINGE) ×1 IMPLANT
TAPE STRIPS DRAPE STRL (GAUZE/BANDAGES/DRESSINGS) ×1 IMPLANT
TOWEL OR 17X24 6PK STRL BLUE (TOWEL DISPOSABLE) ×2 IMPLANT
TOWEL OR 17X26 10 PK STRL BLUE (TOWEL DISPOSABLE) ×2 IMPLANT
WATER STERILE IRR 1000ML POUR (IV SOLUTION) ×2 IMPLANT

## 2013-03-09 NOTE — Patient Instructions (Addendum)
You have an appointment to see Dr. Yetta Barre at 11:30 at [facility] for evaluation

## 2013-03-09 NOTE — Anesthesia Preprocedure Evaluation (Addendum)
Anesthesia Evaluation  Patient identified by MRN, date of birth, ID band Patient awake    Reviewed: Allergy & Precautions, H&P , NPO status , Patient's Chart, lab work & pertinent test results  History of Anesthesia Complications (+) PONV  Airway Mallampati: I TM Distance: >3 FB Neck ROM: Full    Dental  (+) Teeth Intact and Dental Advisory Given   Pulmonary  breath sounds clear to auscultation        Cardiovascular hypertension, Pt. on medications Rhythm:Regular Rate:Normal     Neuro/Psych    GI/Hepatic GERD-  Medicated and Poorly Controlled,  Endo/Other    Renal/GU      Musculoskeletal   Abdominal   Peds  Hematology   Anesthesia Other Findings Vocal cord paralysis, rt, s/p removal carotid body tumour.  Discussed with pt. Possibility of post op vocal cord injury  Reproductive/Obstetrics                          Anesthesia Physical Anesthesia Plan  ASA: II  Anesthesia Plan: General   Post-op Pain Management:    Induction: Intravenous  Airway Management Planned: Oral ETT  Additional Equipment:   Intra-op Plan:   Post-operative Plan: Extubation in OR  Informed Consent: I have reviewed the patients History and Physical, chart, labs and discussed the procedure including the risks, benefits and alternatives for the proposed anesthesia with the patient or authorized representative who has indicated his/her understanding and acceptance.   Dental advisory given  Plan Discussed with: CRNA, Surgeon and Anesthesiologist  Anesthesia Plan Comments:         Anesthesia Quick Evaluation

## 2013-03-09 NOTE — Anesthesia Postprocedure Evaluation (Signed)
  Anesthesia Post-op Note  Patient: Chris Welch  Procedure(s) Performed: Procedure(s) with comments: LUMBAR LAMINECTOMY/DECOMPRESSION MICRODISCECTOMY 1 LEVEL (Left) - Left lumbar three-four extra-foraminal microdiscectomy  Patient Location: PACU  Anesthesia Type:General  Level of Consciousness: awake, alert  and oriented  Airway and Oxygen Therapy: Patient Spontanous Breathing  Post-op Pain: none  Post-op Assessment: Post-op Vital signs reviewed  Post-op Vital Signs: Reviewed  Complications: No apparent anesthesia complications

## 2013-03-09 NOTE — H&P (Signed)
Subjective: Patient is a 67 y.o. male admitted for left L3-4 extraforaminal microdiscectomy. Onset of symptoms was 2 weeks ago, rapidly worsening since that time.  The pain is rated intense, unremitting, and is located at the across the lower back and radiates to left anterior thigh to the knee. There is associated numbness and some weakness. The pain is described as aching, burning and throbbing and occurs all day. The symptoms have been progressive. He has tried steroids and analgesics without relief. Analgesics cause nausea and vomiting for him . Symptoms are exacerbated by coughing, exercise and standing. MRI or CT showed left L3-4 extraforaminal herniated disc. His primary care physician Dr. Cleta Alberts, called me today and explain the patient's situation and recommended that we consider surgical intervention as of the patient's severe unremitting pain and intolerance to conservative medical treatment. He is admitted for surgery in hopes of improvement of his pain syndrome.  Past Medical History  Diagnosis Date  . Depression   . GERD (gastroesophageal reflux disease)   . Enlarged prostate   . 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 Ear infection ear infection lasting past 2 months, still ongoing, pt on antibiotics & drops. pt states "ear infection has eaten holes through bilateral ear drums,  I am hard of hearing"  . Vocal cord paralysis     Right  . Diverticulosis   . Diverticulitis     Past Surgical History  Procedure Laterality Date  . Tumor right side of neck  12/2006    resection of R cervical paraganglioma  . Deep neck lymph node biopsy / excision    . Knee arthrotomy      has had 5 surgeries right knee  . Right inguinal hernia    . Cholecystectomy      Prior to Admission medications   Medication Sig Start Date End Date Taking? Authorizing Provider  albuterol (PROVENTIL HFA;VENTOLIN HFA) 108 (90 BASE) MCG/ACT inhaler Inhale 2 puffs into the lungs  every 6 (six) hours as needed for wheezing. 02/27/13  Yes Elvina Sidle, MD  augmented betamethasone dipropionate (DIPROLENE AF) 0.05 % cream Apply topically 2 (two) times daily. 01/07/13  Yes Collene Gobble, MD  buPROPion (WELLBUTRIN XL) 150 MG 24 hr tablet Take 3 tablets (450 mg total) by mouth every morning. 02/27/13  Yes Elvina Sidle, MD  fluticasone (FLONASE) 50 MCG/ACT nasal spray Place 1 spray into the nose daily.   Yes Historical Provider, MD  gabapentin (NEURONTIN) 100 MG capsule Take 2 tablets at bedtime. 03/04/13  Yes Collene Gobble, MD  glycopyrrolate (ROBINUL) 1 MG tablet Take 1 mg by mouth 3 (three) times daily.   Yes Historical Provider, MD  lisinopril (PRINIVIL,ZESTRIL) 10 MG tablet Take 1 tablet (10 mg total) by mouth daily. 01/01/13  Yes Collene Gobble, MD  ondansetron (ZOFRAN-ODT) 4 MG disintegrating tablet 1-2 tablets every 6 hours as needed for nausea 03/09/13  Yes Collene Gobble, MD  pravastatin (PRAVACHOL) 20 MG tablet Take 20 mg by mouth daily.   Yes Historical Provider, MD  ranitidine (ZANTAC) 150 MG tablet Take 150 mg by mouth 2 (two) times daily.   Yes Historical Provider, MD  tamsulosin (FLOMAX) 0.4 MG CAPS Take 0.8 mg by mouth daily.   Yes Historical Provider, MD  traMADol (ULTRAM) 50 MG tablet Take 1 tablet (50 mg total) by mouth every 8 (eight) hours as needed for pain. 03/08/13  Yes Godfrey Pick, PA-C  traZODone (DESYREL) 50  MG tablet Take 1 tablet (50 mg total) by mouth at bedtime. 01/07/13  Yes Collene Gobble, MD  diazepam (VALIUM) 5 MG tablet 1 tablet every 6-8 hours as needed for muscles spasm 03/09/13   Collene Gobble, MD  HYDROcodone-acetaminophen (NORCO/VICODIN) 5-325 MG per tablet Take 1 tablet by mouth every 6 (six) hours as needed for pain.    Historical Provider, MD  predniSONE (DELTASONE) 20 MG tablet 2 daily with food 03/09/13   Collene Gobble, MD  sildenafil (VIAGRA) 100 MG tablet Take 0.5-1 tablets (50-100 mg total) by mouth daily as needed for erectile  dysfunction. 04/17/12 05/17/12  Chelle Tessa Lerner, PA-C   Allergies  Allergen Reactions  . Penicillins Other (See Comments)    Ulcers on eyes    History  Substance Use Topics  . Smoking status: Never Smoker   . Smokeless tobacco: Never Used  . Alcohol Use: No     Comment: rare alcohol intake    Family History  Problem Relation Age of Onset  . Lung cancer Brother      Review of Systems  Positive ROS: neg  All other systems have been reviewed and were otherwise negative with the exception of those mentioned in the HPI and as above.  Objective: Vital signs in last 24 hours: Temp:  [97.9 F (36.6 C)-98 F (36.7 C)] 98 F (36.7 C) (06/21 1308) Pulse Rate:  [110-119] 110 (06/21 1308) Resp:  [18-20] 20 (06/21 1308) BP: (130-161)/(90-101) 161/101 mmHg (06/21 1308) SpO2:  [97 %-99 %] 99 % (06/21 1308) Weight:  [91.627 kg (202 lb)] 91.627 kg (202 lb) (06/21 0841)  General Appearance: Alert, cooperative, no distress, appears stated age Head: Normocephalic, without obvious abnormality, atraumatic Eyes: PERRL, conjunctiva/corneas clear, EOM's intact     Throat: Lips normal Neck: Supple, symmetrical, trachea midline Back: Symmetric, no curvature, ROM normal, no CVA tenderness Lungs:  respirations unlabored Heart: Regular rate and rhythm Abdomen: Soft, non-tender Extremities: Extremities normal, atraumatic, no cyanosis or edema Pulses: 2+ and symmetric all extremities Skin: Skin color, texture, turgor normal, no rashes or lesions  NEUROLOGIC:   Mental status: Alert and oriented x4,  no aphasia, good attention span, fund of knowledge, and memory Motor Exam - grossly normal except for some mild weakness of the left quadricep Sensory Exam - grossly normal Reflexes: 1+ Coordination - grossly normal Gait -antalgic secondary to pain Balance - grossly normal Cranial Nerves: I: smell Not tested  II: visual acuity  OS: nl    OD: nl  II: visual fields Full to confrontation  II:  pupils Equal, round, reactive to light  III,VII: ptosis None  III,IV,VI: extraocular muscles  Full ROM  V: mastication Normal  V: facial light touch sensation  Normal  V,VII: corneal reflex  Present  VII: facial muscle function - upper  Normal  VII: facial muscle function - lower Normal  VIII: hearing Not tested  IX: soft palate elevation  Normal  IX,X: gag reflex Present  XI: trapezius strength  5/5  XI: sternocleidomastoid strength 5/5  XI: neck flexion strength  5/5  XII: tongue strength  Normal    Data Review Lab Results  Component Value Date   WBC 7.7 04/17/2012   HGB 14.2 04/17/2012   HCT 40.3 04/17/2012   MCV 84.7 04/17/2012   PLT 245 04/17/2012   Lab Results  Component Value Date   NA 135 01/07/2013   K 4.1 01/07/2013   CL 100 01/07/2013   CO2 28 01/07/2013  BUN 14 01/07/2013   CREATININE 1.03 01/07/2013   GLUCOSE 92 01/07/2013   No results found for this basename: INR, PROTIME    Assessment/Plan: Patient admitted for left L3-4 extraforaminal microdiscectomy. Patient has failed conservative therapy.  I explained the condition and procedure to the patient and answered any questions.  Patient wishes to proceed with procedure as planned. Understands risks/ benefits and typical outcomes of procedure. Risks include but are not limited to bleeding, infection, nerve root injury, numbness weakness, vascular injury, CSF leak, lack of relief of symptoms, worsening of symptoms, bowel and bladder dysfunction, sexual dysfunction, possible need for further surgery, iatrogenic instability, and anesthesia risk including DVT, pneumonia, and death. They understand this and wish to proceed. Options have been discussed and questions have been encouraged and answered.   Gurnoor Sloop S 03/09/2013 1:26 PM

## 2013-03-09 NOTE — Progress Notes (Signed)
  Subjective:    Patient ID: Chris Welch, male    DOB: 1946-08-13, 67 y.o.   MRN: [id number]  HPI patient enters with severe pain in the left back with numbness and pain that extends down to the left knee he had his MRI done and has this disease at L3-4 and L2-3. There is a disc extrusion present at L3-L4 with hit the left L3  nerve root. There is also asymmetric disc bulging at L2-L3. He has had extreme nausea and vomiting. He is unable to get relief from his pain. He cannot tolerate Ultram or hydrocodone.    Review of Systems     Objective:   Physical Exam patient appears in extreme discomfort. He sits off to the left. On exam he is very tender along the left SI joint and left paralumbar muscles. He does have normal knee and ankle reflexes. He has pain with any movement of the left leg or extension of the left leg past 30 to        Assessment & Plan:  I discussed this case with Dr. Yetta Barre neurosurgeon on call. He was kind enough to see him on Monday at 11:30. In the interim we'll give 125 of Solu-Medrol IM . After the appointment was made Dr. Yetta Barre stated he would call the patient. He was subsequently admitted for evaluation and probable surgical removal of the extruded disc frag

## 2013-03-09 NOTE — Preoperative (Signed)
Beta Blockers   Reason not to administer Beta Blockers:Pt. not on beta blocker at home

## 2013-03-09 NOTE — Op Note (Signed)
03/09/2013  4:11 PM  PATIENT:  Chris Welch  67 y.o. male  PRE-OPERATIVE DIAGNOSIS:  Left L3-4 extraforaminal herniated nucleus pulposus with left L3 radiculopathy  POST-OPERATIVE DIAGNOSIS:  Same  PROCEDURE:  Left L3 for extraforaminal microdiscectomy utilizing microscopic dissection  SURGEON:  Marikay Alar, MD  ASSISTANTS: none  ANESTHESIA:   General  EBL: 25 ml  Total I/O In: 1000 [I.V.:1000] Out: 25 [Blood:25]  BLOOD ADMINISTERED:none  DRAINS: None   SPECIMEN:  No Specimen  INDICATION FOR PROCEDURE: This patient presented with severe left leg pain in an L3 distribution. MRI showed a herniated disc at L3-4 and extra foraminal space compressing the left L3 nerve root. He tried medical management without relief. Recommended a left L3-4 extra foraminal microdiscectomy. Patient understood the risks, benefits, and alternatives and potential outcomes and wished to proceed.  PROCEDURE DETAILS: The patient was taken to the operating room and after induction of adequate generalized endotracheal anesthesia, the patient was rolled into the prone position on the Wilson frame and all pressure points were padded. The lumbar region was cleaned and then prepped with DuraPrep and draped in the usual sterile fashion. 5 cc of local anesthesia was injected and then a dorsal midline incision was made and carried down to the lumbo sacral fascia. The fascia was opened and the paraspinous musculature was taken down in a subperiosteal fashion to expose the extraforaminal space at L3-4 on the left. Intraoperative x-ray confirmed my level, and then I used a combination of the high-speed drill and the Kerrison punches to perform an extraforaminal foraminotomies at L34 on the left. Drilled away the lateral part of the pars and superior part of the facet to expose the extraforaminal space. The underlying yellow ligament was opened and removed in a piecemeal fashion to expose the underlying exiting nerve  root. I undercut the lateral recess and dissected down until I was medial to and distal to the pedicle. The nerve root was well decompressed. I performed a thorough intradiscal discectomy with pituitary rongeurs and curettes, until I had a nice decompression of the nerve root below the axilla. I then palpated with a coronary dilator along the nerve root and into the foramen to assure adequate decompression. Here I found several fragments of disc distally in the foramen where I suspected there would be based on his preoperative MRI. I continued to sweep with a coronary dilator until I could get no more fragments and the nerve felt free. I felt no more compression of the nerve root. I irrigated with saline solution containing bacitracin. Achieved hemostasis with bipolar cautery, lined the dura with Gelfoam, and then closed the fascia with 0 Vicryl. I closed the subcutaneous tissues with 2-0 Vicryl and the subcuticular tissues with 3-0 Vicryl. The skin was then closed with benzoin and Steri-Strips. The drapes were removed, a sterile dressing was applied. The patient was awakened from general anesthesia and transferred to the recovery room in stable condition. At the end of the procedure all sponge, needle and instrument counts were correct.   PLAN OF CARE: Admit for overnight observation  PATIENT DISPOSITION:  PACU - hemodynamically stable.   Delay start of Pharmacological VTE agent (>24hrs) due to surgical blood loss or risk of bleeding:  yes

## 2013-03-09 NOTE — Transfer of Care (Signed)
Immediate Anesthesia Transfer of Care Note  Patient: Chris Welch  Procedure(s) Performed: Procedure(s) with comments: LUMBAR LAMINECTOMY/DECOMPRESSION MICRODISCECTOMY 1 LEVEL (Left) - Left lumbar three-four extra-foraminal microdiscectomy  Patient Location: PACU  Anesthesia Type:General  Level of Consciousness: awake  Airway & Oxygen Therapy: Patient Spontanous Breathing and Patient connected to nasal cannula oxygen  Post-op Assessment: Report given to PACU RN and Post -op Vital signs reviewed and stable  Post vital signs: Reviewed and stable  Complications: No apparent anesthesia complications

## 2013-03-09 NOTE — Anesthesia Procedure Notes (Signed)
Procedure Name: Intubation Date/Time: 03/09/2013 2:28 PM Performed by: Alanda Amass A Pre-anesthesia Checklist: Patient identified, Timeout performed, Emergency Drugs available, Suction available and Patient being monitored Patient Re-evaluated:Patient Re-evaluated prior to inductionOxygen Delivery Method: Circle system utilized Preoxygenation: Pre-oxygenation with 100% oxygen Intubation Type: IV induction Ventilation: Mask ventilation without difficulty Laryngoscope Size: Mac and 3 Grade View: Grade I Tube type: Oral Tube size: 7.5 mm Number of attempts: 1 Airway Equipment and Method: Stylet Placement Confirmation: ETT inserted through vocal cords under direct vision,  positive ETCO2 and breath sounds checked- equal and bilateral Secured at: 22 cm Tube secured with: Tape Dental Injury: Teeth and Oropharynx as per pre-operative assessment

## 2013-03-10 ENCOUNTER — Telehealth: Payer: Self-pay

## 2013-03-10 LAB — BASIC METABOLIC PANEL
BUN: 22 mg/dL (ref 6–23)
CO2: 25 mEq/L (ref 19–32)
Calcium: 8.8 mg/dL (ref 8.4–10.5)
Chloride: 93 mEq/L — ABNORMAL LOW (ref 96–112)
Creatinine, Ser: 0.89 mg/dL (ref 0.50–1.35)
GFR calc Af Amer: >90 mL/min (ref >90)
GFR calc non Af Amer: 87 mL/min — ABNORMAL LOW (ref >90)
Glucose, Bld: 150 mg/dL — ABNORMAL HIGH (ref 70–99)
Potassium: 4.5 mEq/L (ref 3.5–5.1)
Sodium: 125 mEq/L — ABNORMAL LOW (ref 135–145)

## 2013-03-10 NOTE — Discharge Summary (Signed)
Physician Discharge Summary  Patient ID: Chris Welch MRN: [id number] DOB/AGE: [birth year] 67 y.o.  Admit date: 03/09/2013 Discharge date: 03/10/2013  Admission Diagnoses: Left L3-4 extraforaminal herniated disc    Discharge Diagnoses: Same   Discharged Condition: good  Hospital Course: The patient was admitted on 03/09/2013 and taken to the operating room where the patient underwent left L3-4 extremity microdiscectomy. The patient tolerated the procedure well and was taken to the recovery room and then to the floor in stable condition. The hospital course was routine. There were no complications. The wound remained clean dry and intact. Pt had appropriate mild back soreness. No complaints of leg pain or new N/T/W. The patient remained afebrile with stable vital signs, and tolerated a regular diet. The patient continued to increase activities, and pain was well controlled with oral pain medications.   Consults: None  Significant Diagnostic Studies:  Results for orders placed during the hospital encounter of 03/09/13  MRSA PCR SCREENING      Result Value Range   MRSA by PCR NEGATIVE  NEGATIVE  BASIC METABOLIC PANEL      Result Value Range   Sodium 134 (*) 135 - 145 mEq/L   Potassium 5.4 (*) 3.5 - 5.1 mEq/L   Chloride 95 (*) 96 - 112 mEq/L   CO2 27  19 - 32 mEq/L   Glucose, Bld 122 (*) 70 - 99 mg/dL   BUN 23  6 - 23 mg/dL   Creatinine, Ser 4.09  0.50 - 1.35 mg/dL   Calcium 81.1  8.4 - 91.4 mg/dL   GFR calc non Af Amer 87 (*) >90 mL/min   GFR calc Af Amer >90  >90 mL/min  CBC WITH DIFFERENTIAL      Result Value Range   WBC 15.9 (*) 4.0 - 10.5 K/uL   RBC 5.08  4.22 - 5.81 MIL/uL   Hemoglobin 15.1  13.0 - 17.0 g/dL   HCT 78.2  95.6 - 21.3 %   MCV 86.4  78.0 - 100.0 fL   MCH 29.7  26.0 - 34.0 pg   MCHC 34.4  30.0 - 36.0 g/dL   RDW 08.6  57.8 - 46.9 %   Platelets 210  150 - 400 K/uL   Neutrophils Relative % 96 (*) 43 - 77 %   Neutro Abs 15.3 (*) 1.7 - 7.7 K/uL    Lymphocytes Relative 3 (*) 12 - 46 %   Lymphs Abs 0.4 (*) 0.7 - 4.0 K/uL   Monocytes Relative 1 (*) 3 - 12 %   Monocytes Absolute 0.2  0.1 - 1.0 K/uL   Eosinophils Relative 0  0 - 5 %   Eosinophils Absolute 0.0  0.0 - 0.7 K/uL   Basophils Relative 0  0 - 1 %   Basophils Absolute 0.0  0.0 - 0.1 K/uL    Dg Lumbar Spine 2-3 Views  03/09/2013   *RADIOLOGY REPORT*  Clinical Data: Back pain  LUMBAR SPINE - 2-3 VIEW  Comparison: 03/07/2013.  Findings: Intraoperative lateral spine film #1 demonstrates a needle directed most closely toward the L2-3 interspace. Film #2 of demonstrates a needle directed most closely toward L3-L4.  Film #3 demonstrates a probe directed most closely toward the L3-4 foramen.  IMPRESSION:  L3-L4 marked.   Original Report Authenticated By: Davonna Belling, M.D.   Dg Lumbar Spine Complete  03/04/2013   *RADIOLOGY REPORT*  Clinical Data: Lower back pain.  LUMBAR SPINE - COMPLETE 4+ VIEW  Comparison: None.  Findings: No fracture or spondylolisthesis  is noted.  Moderate degenerative disc disease is seen at L1-2, L2-3 and L3-4 with anterior osteophyte formation at these levels.  Mild degenerative disc disease is noted at L3-4 with anterior osteophyte formation. Posterior facet joints appear normal.  Minimal dextroscoliosis of lumbar spine is noted.  IMPRESSION: Multilevel degenerative disc disease is noted which is most severe at the L2-3 and L4-5 levels.  No acute abnormality is noted in the lumbar spine.  Clinically significant discrepancy from primary report, if provided: None   Original Report Authenticated By: Lupita Raider.,  M.D.   Mr Lumbar Spine Wo Contrast  03/07/2013   *RADIOLOGY REPORT*  Clinical Data: Low back pain with left leg pain, weakness and numbness.  No acute injury or prior relevant surgery.  MRI LUMBAR SPINE WITHOUT CONTRAST  Technique:  Multiplanar and multiecho pulse sequences of the lumbar spine were obtained without intravenous contrast.  Comparison: Lumbar spine  radiographs 03/04/2013.  Findings: Radiographs demonstrate five lumbar type vertebral bodies.  There is a convex right scoliosis.  The lateral alignment is normal.  There is no evidence of acute fracture or pars defect. Scattered Schmorl's nodes are noted.  The conus medullaris extends to the L1 level and appears normal. There are no paraspinal abnormalities.  L1-L2:  Annular disc bulging and osteophytes are eccentric to the left.  There is mild narrowing of the lateral recesses without nerve root encroachment.  The foramina are patent.  L2-L3:  Annular disc bulging and osteophytes are eccentric to the left.  There is mild facet and ligamentous hypertrophy.  There is mild narrowing of both lateral recesses.  In addition, there is mild inferior foraminal narrowing on the left with possible extraforaminal left L2 nerve root encroachment.  L3-L4:  There is annular disc bulging with mild facet and ligamentous hypertrophy.  There is a probable focal extraforaminal disc extrusion on the left, best seen on the axial images 24 and 25.  This causes probable extraforaminal left L3 nerve root encroachment.  L4-L5:  There are suspected remote postsurgical changes on the right.  There is chronic disc space loss with disc bulging and paraspinal osteophytes asymmetric to the right.  There is mild right lateral recess stenosis and right greater than left foraminal stenosis.  L5-S1:  Disc height and hydration are maintained.  There is mild bilateral facet hypertrophy.  No spinal stenosis or nerve root encroachment.  IMPRESSION:  1.  Suspected extraforaminal disc extrusion on the left at L3-L4 with resulting left L3 nerve root encroachment.  Correlate clinically. 2.  Asymmetric disc bulging and osteophytes on the left at L2-L3 contribute to mild narrowing of both lateral recesses and the left foramen.  There is possible extraforaminal left L2 nerve root encroachment.  3.  Disc bulging and facet disease are asymmetric to the right at  L4-L5 and contribute to right lateral recess and biforaminal stenosis.   Original Report Authenticated By: Carey Bullocks, M.D.   Dg Chest Port 1 View  03/09/2013   *RADIOLOGY REPORT*  Clinical Data: Preoperative respiratory exam for lumbar disc herniation.  PORTABLE CHEST - 1 VIEW  Comparison: 04/18/2011  Findings: The lungs are clear.  No edema or infiltrate is present. The heart size and mediastinal contours are within normal limits. No pleural effusions are identified.  IMPRESSION: No active disease.   Original Report Authenticated By: Irish Lack, M.D.    Antibiotics:  Anti-infectives   Start     Dose/Rate Route Frequency Ordered Stop   03/09/13 1459  bacitracin 50,000  Units in sodium chloride irrigation 0.9 % 500 mL irrigation  Status:  Discontinued       As needed 03/09/13 1459 03/09/13 1618   03/09/13 1330  vancomycin (VANCOCIN) IVPB 1000 mg/200 mL premix     1,000 mg 200 mL/hr over 60 Minutes Intravenous  Once 03/09/13 1322 03/09/13 1430      Discharge Exam: Blood pressure 103/72, pulse 97, temperature 97.6 F (36.4 C), temperature source Oral, resp. rate 18, SpO2 97.00%. Neurologic: Grossly normal Incision clean dry and intact  Discharge Medications:     Medication List    STOP taking these medications       gabapentin 100 MG capsule  Commonly known as:  NEURONTIN      TAKE these medications       albuterol 108 (90 BASE) MCG/ACT inhaler  Commonly known as:  PROVENTIL HFA;VENTOLIN HFA  Inhale 2 puffs into the lungs every 6 (six) hours as needed for wheezing.     augmented betamethasone dipropionate 0.05 % cream  Commonly known as:  DIPROLENE AF  Apply topically 2 (two) times daily.     buPROPion 150 MG 24 hr tablet  Commonly known as:  WELLBUTRIN XL  Take 3 tablets (450 mg total) by mouth every morning.     diazepam 5 MG tablet  Commonly known as:  VALIUM  1 tablet every 6-8 hours as needed for muscles spasm     fluticasone 50 MCG/ACT nasal spray   Commonly known as:  FLONASE  Place 1 spray into the nose daily.     glycopyrrolate 1 MG tablet  Commonly known as:  ROBINUL  Take 1 mg by mouth 3 (three) times daily.     HYDROcodone-acetaminophen 5-325 MG per tablet  Commonly known as:  NORCO/VICODIN  Take 1 tablet by mouth every 6 (six) hours as needed for pain.     lisinopril 10 MG tablet  Commonly known as:  PRINIVIL,ZESTRIL  Take 1 tablet (10 mg total) by mouth daily.     ondansetron 4 MG disintegrating tablet  Commonly known as:  ZOFRAN-ODT  1-2 tablets every 6 hours as needed for nausea     pravastatin 20 MG tablet  Commonly known as:  PRAVACHOL  Take 20 mg by mouth daily.     predniSONE 20 MG tablet  Commonly known as:  DELTASONE  2 daily with food     ranitidine 150 MG tablet  Commonly known as:  ZANTAC  Take 150 mg by mouth 2 (two) times daily.     tamsulosin 0.4 MG Caps  Commonly known as:  FLOMAX  Take 0.8 mg by mouth daily.     traMADol 50 MG tablet  Commonly known as:  ULTRAM  Take 1 tablet (50 mg total) by mouth every 8 (eight) hours as needed for pain.     traZODone 50 MG tablet  Commonly known as:  DESYREL  Take 1 tablet (50 mg total) by mouth at bedtime.      ASK your doctor about these medications       sildenafil 100 MG tablet  Commonly known as:  VIAGRA  Take 0.5-1 tablets (50-100 mg total) by mouth daily as needed for erectile dysfunction.        Disposition: Home   Final Dx: Left L3 for extraforaminal microdiscectomy      Discharge Orders   Future Orders Complete By Expires     Call MD for:  difficulty breathing, headache or visual disturbances  As directed  Call MD for:  persistant nausea and vomiting  As directed     Call MD for:  redness, tenderness, or signs of infection (pain, swelling, redness, odor or green/yellow discharge around incision site)  As directed     Call MD for:  severe uncontrolled pain  As directed     Call MD for:  temperature >100.4  As directed      Diet - low sodium heart healthy  As directed     Discharge instructions  As directed     Comments:      May shower normally, no heavy lifting or strenuous short work or housework, no bending or twisting, no driving for 1 week    Increase activity slowly  As directed     No wound care  As directed        Follow-up Information   Follow up with Ravneet Spilker S, MD. Schedule an appointment as soon as possible for a visit in 2 weeks.   Contact information:   [address] [place] [address] [phone]        Signed: Tia Alert 03/10/2013, 7:37 AM

## 2013-03-10 NOTE — Progress Notes (Addendum)
Patient ready for discharge home; home medications reviewed with patient and patient's wife; BMET reported to Dr.Jones; Lisinopril was held today; instructed to follow up with his PCP reguarding his BMET report Monday morning; instructed not to over consume fluids today.   Discharge instructions given and reviewed with patient.  Patient reports no pain; VSS; ready for release home; good intake at breakfast; denies pain, nausea, weakness, or dizziness.

## 2013-03-10 NOTE — Progress Notes (Signed)
Patient ID: Chris Welch, male   DOB: March 16, 1946, 67 y.o.   MRN: [id number] Na 125 today, down from 134. Pt asymptomatic and very eager for D/C home. 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 Cleta Alberts (PCP) tomorrow for repeat labs.

## 2013-03-10 NOTE — Telephone Encounter (Signed)
Patient's sodium had dropped from 134 and on admission yesterday to 125 this morning and sugar had increased to 150. His lisinopril was placed on hold and he will follow up on Wednesday for a repeat bmet and fasting blood sugar.

## 2013-03-10 NOTE — Telephone Encounter (Signed)
Patients wife says that Chris Welch had surgery yesterday on Saturday; he is doing well except they are concerned with his electrolytes and would like him to follow through with his bp medicine. Patient says thank you for his help yesterday.  Patients wife would like to speak to Dr. Cleta Alberts.  Best number: [phone]

## 2013-03-13 ENCOUNTER — Ambulatory Visit (INDEPENDENT_AMBULATORY_CARE_PROVIDER_SITE_OTHER): Payer: 59 | Admitting: Emergency Medicine

## 2013-03-13 ENCOUNTER — Telehealth: Payer: Self-pay | Admitting: Radiology

## 2013-03-13 VITALS — BP 110/70 | HR 89 | Temp 98.0°F | Resp 18 | Ht 74.0 in | Wt 202.0 lb

## 2013-03-13 DIAGNOSIS — M546 Pain in thoracic spine: Secondary | ICD-10-CM

## 2013-03-13 DIAGNOSIS — E871 Hypo-osmolality and hyponatremia: Secondary | ICD-10-CM | POA: Insufficient documentation

## 2013-03-13 DIAGNOSIS — M549 Dorsalgia, unspecified: Secondary | ICD-10-CM

## 2013-03-13 LAB — BASIC METABOLIC PANEL
BUN: 19 mg/dL (ref 6–23)
CO2: 27 mEq/L (ref 19–32)
Calcium: 9.2 mg/dL (ref 8.4–10.5)
Chloride: 99 mEq/L (ref 96–112)
Creat: 1 mg/dL (ref 0.50–1.35)
Glucose, Bld: 85 mg/dL (ref 70–99)
Potassium: 3.9 mEq/L (ref 3.5–5.3)
Sodium: 136 mEq/L (ref 135–145)

## 2013-03-13 NOTE — Telephone Encounter (Signed)
Call from [facility], they are faxing report, unsure why report being faxed results are in computer, I think you ordered this stat, results are in. FYI

## 2013-03-13 NOTE — Progress Notes (Signed)
  Subjective:    Patient ID: Chris Welch, male    DOB: Jan 19, 1946, 67 y.o.   MRN: [id number]  HPI is seen over the weekend had emergency surgery for a herniated disc with extrusion impinging the nerve. Surgery was performed on Saturday. He was discharged on Sunday. The problem was his sodium dropped in the hospital from 134 to-125 in  24 hours. He is here today for followup he met. Since discharge she has done in very well without pain into his leg. He does have some weakness in the left leg but this appears to be improved    Review of Systems     Objective:   Physical Exam there is a healing incision over the lower lumbar spine. He does have mild weakness with hip flexion and extension of the left leg.  Results for orders placed in visit on 03/13/13  BASIC METABOLIC PANEL      Result Value Range   Sodium 136  135 - 145 mEq/L   Potassium 3.9  3.5 - 5.3 mEq/L   Chloride 99  96 - 112 mEq/L   CO2 27  19 - 32 mEq/L   Glucose, Bld 85  70 - 99 mg/dL   BUN 19  6 - 23 mg/dL   Creat 4.09  8.11 - 9.14 mg/dL   Calcium 9.2  8.4 - 78.2 mg/dL        Assessment & Plan:  Repeat labs done today to followup on hyponatremia. I will call them when I get these results. Results are in his sodium is normal now

## 2013-05-28 ENCOUNTER — Ambulatory Visit (INDEPENDENT_AMBULATORY_CARE_PROVIDER_SITE_OTHER): Payer: 59 | Admitting: Physician Assistant

## 2013-05-28 ENCOUNTER — Encounter: Payer: Self-pay | Admitting: Physician Assistant

## 2013-05-28 VITALS — BP 100/62 | HR 94 | Temp 98.1°F | Resp 16 | Ht 72.5 in | Wt 197.0 lb

## 2013-05-28 DIAGNOSIS — F329 Major depressive disorder, single episode, unspecified: Secondary | ICD-10-CM

## 2013-05-28 DIAGNOSIS — Z0000 Encounter for general adult medical examination without abnormal findings: Secondary | ICD-10-CM

## 2013-05-28 DIAGNOSIS — N401 Enlarged prostate with lower urinary tract symptoms: Secondary | ICD-10-CM

## 2013-05-28 DIAGNOSIS — Z23 Encounter for immunization: Secondary | ICD-10-CM

## 2013-05-28 DIAGNOSIS — N138 Other obstructive and reflux uropathy: Secondary | ICD-10-CM

## 2013-05-28 DIAGNOSIS — G4700 Insomnia, unspecified: Secondary | ICD-10-CM

## 2013-05-28 DIAGNOSIS — E785 Hyperlipidemia, unspecified: Secondary | ICD-10-CM

## 2013-05-28 DIAGNOSIS — Z125 Encounter for screening for malignant neoplasm of prostate: Secondary | ICD-10-CM

## 2013-05-28 DIAGNOSIS — Z1211 Encounter for screening for malignant neoplasm of colon: Secondary | ICD-10-CM

## 2013-05-28 DIAGNOSIS — Z139 Encounter for screening, unspecified: Secondary | ICD-10-CM

## 2013-05-28 DIAGNOSIS — J3800 Paralysis of vocal cords and larynx, unspecified: Secondary | ICD-10-CM

## 2013-05-28 DIAGNOSIS — F32A Depression, unspecified: Secondary | ICD-10-CM

## 2013-05-28 LAB — COMPREHENSIVE METABOLIC PANEL
ALT: 14 U/L (ref 0–53)
AST: 13 U/L (ref 0–37)
Albumin: 4.2 g/dL (ref 3.5–5.2)
Alkaline Phosphatase: 56 U/L (ref 39–117)
BUN: 12 mg/dL (ref 6–23)
CO2: 30 mEq/L (ref 19–32)
Calcium: 8.9 mg/dL (ref 8.4–10.5)
Chloride: 102 mEq/L (ref 96–112)
Creat: 0.97 mg/dL (ref 0.50–1.35)
Glucose, Bld: 84 mg/dL (ref 70–99)
Potassium: 4.4 mEq/L (ref 3.5–5.3)
Sodium: 137 mEq/L (ref 135–145)
Total Bilirubin: 0.6 mg/dL (ref 0.3–1.2)
Total Protein: 6.7 g/dL (ref 6.0–8.3)

## 2013-05-28 LAB — POCT URINALYSIS DIPSTICK
Bilirubin, UA: NEGATIVE
Blood, UA: NEGATIVE
Glucose, UA: NEGATIVE
Ketones, UA: NEGATIVE
Leukocytes, UA: NEGATIVE
Nitrite, UA: NEGATIVE
Protein, UA: NEGATIVE
Spec Grav, UA: 1.015
Urobilinogen, UA: 0.2
pH, UA: 7

## 2013-05-28 LAB — POCT UA - MICROSCOPIC ONLY
Bacteria, U Microscopic: NEGATIVE
Casts, Ur, LPF, POC: NEGATIVE
Crystals, Ur, HPF, POC: NEGATIVE
Yeast, UA: NEGATIVE

## 2013-05-28 LAB — LIPID PANEL
Cholesterol: 156 mg/dL (ref 0–200)
HDL: 52 mg/dL (ref >39)
LDL Cholesterol: 92 mg/dL (ref 0–99)
Total CHOL/HDL Ratio: 3 Ratio
Triglycerides: 62 mg/dL (ref <150)
VLDL: 12 mg/dL (ref 0–40)

## 2013-05-28 LAB — TSH: TSH: 2.352 u[IU]/mL (ref 0.350–4.500)

## 2013-05-28 LAB — IFOBT (OCCULT BLOOD): IFOBT: NEGATIVE

## 2013-05-28 MED ORDER — TRAZODONE HCL 50 MG PO TABS: 50.0000 mg | ORAL_TABLET | Freq: Every day | ORAL | Status: DC

## 2013-05-28 NOTE — Progress Notes (Signed)
Subjective:    Patient ID: Chris Welch, male    DOB: [birth year], 67 y.o.   MRN: [id number]  HPI  This 67 y.o. male presents for Annual Wellness Exam.  Patient Active Problem List   Diagnosis Date Noted  . Low sodium levels 03/13/2013  . BPH associated with nocturia 08/21/2012  . Hyperlipidemia 08/21/2012  . Recurrent aspiration bronchitis/pneumonia 10/05/2011  . Diverticulosis 10/05/2011  . Hearing loss of both ears 10/05/2011  . Depression 10/05/2011  . Vocal cord paralysis 01/17/2007   Review of Systems  Constitutional: Positive for fatigue.  HENT: Positive for hearing loss, congestion, trouble swallowing, voice change, tinnitus and ear discharge.        Swallowing and voice problems worsen with fatigue and prolonged talking-he does better in the mornings.  Eyes: Negative.   Respiratory: Positive for cough (chronic aspiration; worse with ice cream which he now avoids).   Cardiovascular: Negative.   Gastrointestinal: Positive for constipation (BM Q 4 days, no straining or pain). Negative for nausea, vomiting, abdominal pain, diarrhea, blood in stool, abdominal distention, anal bleeding and rectal pain.  Endocrine: Negative.   Genitourinary: Negative.   Musculoskeletal: Positive for joint swelling and arthralgias.       These symptoms are occasional, and not frequent enough that he wants to take medication.  Skin: Negative.   Allergic/Immunologic: Negative.   Neurological: Negative.   Hematological: Negative.   Psychiatric/Behavioral: Negative.        Objective:   Physical Exam  Vitals reviewed. Constitutional: He is oriented to person, place, and time. Vital signs are normal. He appears well-developed and well-nourished. He is active and cooperative.  Non-toxic appearance. He does not have a sickly appearance. He does not appear ill. No distress.  HENT:  Head: Normocephalic and atraumatic.  Right Ear: Hearing, tympanic membrane, external ear and ear canal normal.   Left Ear: Hearing, tympanic membrane, external ear and ear canal normal.  Nose: Nose normal.  Mouth/Throat: Uvula is midline, oropharynx is clear and moist and mucous membranes are normal. He does not have dentures. No oral lesions. No trismus in the jaw. Normal dentition. No dental abscesses, edematous, lacerations or dental caries.  Eyes: Conjunctivae, EOM and lids are normal. Pupils are equal, round, and reactive to light. Right eye exhibits no discharge. Left eye exhibits no discharge. No scleral icterus.  Fundoscopic exam:      The right eye shows no arteriolar narrowing, no AV nicking, no exudate, no hemorrhage and no papilledema.       The left eye shows no arteriolar narrowing, no AV nicking, no exudate, no hemorrhage and no papilledema.  Neck: Normal range of motion, full passive range of motion without pain and phonation normal. Neck supple. No spinous process tenderness and no muscular tenderness present. No rigidity. No tracheal deviation, no edema, no erythema and normal range of motion present. No thyromegaly present.  Cardiovascular: Normal rate, regular rhythm, S1 normal, S2 normal, normal heart sounds, intact distal pulses and normal pulses.  Exam reveals no gallop and no friction rub.   No murmur heard. Pulmonary/Chest: Effort normal and breath sounds normal. No respiratory distress. He has no wheezes. He has no rales.  Abdominal: Soft. Normal appearance and bowel sounds are normal. He exhibits no distension and no mass. There is no hepatosplenomegaly. There is no tenderness. There is no rebound and no guarding. No hernia. Hernia confirmed negative in the right inguinal area and confirmed negative in the left inguinal area.  Genitourinary: Rectum normal, prostate  normal, testes normal and penis normal. Guaiac negative stool. Prostate is not tender. Circumcised. No phimosis, paraphimosis, hypospadias, penile erythema or penile tenderness. No discharge found.  Musculoskeletal: Normal  range of motion. He exhibits no edema and no tenderness.       Right shoulder: Normal.       Left shoulder: Normal.       Right elbow: Normal.      Left elbow: Normal.       Right wrist: Normal.       Left wrist: Normal.       Right hip: Normal.       Left hip: Normal.       Right knee: Normal.       Left knee: Normal.       Right ankle: Normal. Achilles tendon normal.       Left ankle: Normal. Achilles tendon normal.       Cervical back: Normal. He exhibits normal range of motion, no tenderness, no bony tenderness, no swelling, no edema, no deformity, no laceration, no pain, no spasm and normal pulse.       Thoracic back: Normal.       Lumbar back: Normal.       Right upper arm: Normal.       Left upper arm: Normal.       Right forearm: Normal.       Left forearm: Normal.       Right hand: Normal.       Left hand: Normal.       Right upper leg: Normal.       Left upper leg: Normal.       Right lower leg: Normal.       Left lower leg: Normal.       Right foot: Normal.       Left foot: Normal.  Lymphadenopathy:       Head (right side): No submental, no submandibular, no tonsillar, no preauricular, no posterior auricular and no occipital adenopathy present.       Head (left side): No submental, no submandibular, no tonsillar, no preauricular, no posterior auricular and no occipital adenopathy present.    He has no cervical adenopathy.       Right: No inguinal and no supraclavicular adenopathy present.       Left: No inguinal and no supraclavicular adenopathy present.  Neurological: He is alert and oriented to person, place, and time. He has normal strength and normal reflexes. He displays no tremor. No cranial nerve deficit. He exhibits normal muscle tone. Coordination and gait normal.  Skin: Skin is warm, dry and intact. No abrasion, no ecchymosis, no laceration, no lesion and no rash noted. He is not diaphoretic. No cyanosis or erythema. No pallor. Nails show no clubbing.   Psychiatric: He has a normal mood and affect. His speech is normal and behavior is normal. Judgment and thought content normal. Cognition and memory are normal.         Assessment & Plan:  Routine general medical examination at a health care facility - Age appropriate anticipatory guidance provided.  Need for influenza vaccination - Plan: Flu Vaccine QUAD 36+ mos IM  BPH associated with nocturia - Plan: PSA, POCT urinalysis dipstick, POCT UA - Microscopic Only, Testosterone  Depression - Plan: TSH  Hyperlipidemia - Plan: Comprehensive metabolic panel, Lipid panel  Vocal cord paralysis - stable  Screening for prostate cancer - Plan: PSA  Screening for colon cancer - Plan: IFOBT POC (  occult bld, rslt in office)  Insomnia - Plan: traZODone (DESYREL) 50 MG tablet  Fernande Bras, PA-C Physician Assistant-Certified [facility]

## 2013-05-28 NOTE — Patient Instructions (Signed)

## 2013-05-29 LAB — PSA: PSA: 1.69 ng/mL (ref <=4.00)

## 2013-05-29 LAB — TESTOSTERONE: Testosterone: 580 ng/dL (ref 300–890)

## 2013-06-10 ENCOUNTER — Other Ambulatory Visit: Payer: Self-pay | Admitting: Physician Assistant

## 2013-06-10 NOTE — Telephone Encounter (Signed)
Chris Welch, pt just in for CPE but this med wasn't discussed. Do you want to give pt RFs?

## 2013-06-17 ENCOUNTER — Other Ambulatory Visit: Payer: Self-pay | Admitting: Physician Assistant

## 2013-06-19 ENCOUNTER — Other Ambulatory Visit: Payer: Self-pay | Admitting: Physician Assistant

## 2013-08-07 NOTE — Progress Notes (Signed)
PA approved for bupropion 150 3 tabs QD through 08/05/14. Pharm notified.

## 2013-08-10 ENCOUNTER — Other Ambulatory Visit: Payer: Self-pay | Admitting: Physician Assistant

## 2013-08-21 ENCOUNTER — Ambulatory Visit (INDEPENDENT_AMBULATORY_CARE_PROVIDER_SITE_OTHER): Payer: 59 | Admitting: Emergency Medicine

## 2013-08-21 VITALS — BP 118/68 | HR 60 | Temp 98.1°F | Resp 16 | Ht 72.5 in | Wt 215.0 lb

## 2013-08-21 DIAGNOSIS — G5621 Lesion of ulnar nerve, right upper limb: Secondary | ICD-10-CM

## 2013-08-21 DIAGNOSIS — M5412 Radiculopathy, cervical region: Secondary | ICD-10-CM

## 2013-08-21 MED ORDER — NAPROXEN SODIUM 550 MG PO TABS: 550.0000 mg | ORAL_TABLET | Freq: Two times a day (BID) | ORAL | Status: AC

## 2013-08-21 NOTE — Progress Notes (Signed)
[facility] [address], [place] [address] [phone]  Date:  08/21/2013   Name:  Chris Welch   DOB:  1946/06/08   MRN:  [id number]  PCP:  Lucilla Edin, MD    Chief Complaint: Arm Pain   History of Present Illness:  Chris Welch is a 67 y.o. very pleasant male patient who presents with the following:  No history of injury or overuse.  Has intermittent short duration tingling in right arm associated with pain in elbow over ulnar nerve with movement.  Says paresthesias affect the ulnar distribution. Not associated with pain in arm or hand or weakness in arm or hand.  Says symptoms resolve spontaneously.  No headache, difficulty with gait balance or coordination.  No prior events.  No neck pain.  No improvement with over the counter medications or other home remedies. Denies other complaint or health concern today. Denies other complaint or health concern today.   Patient Active Problem List   Diagnosis Date Noted  . Low sodium levels 03/13/2013  . BPH associated with nocturia 08/21/2012  . Hyperlipidemia 08/21/2012  . Recurrent aspiration bronchitis/pneumonia 10/05/2011  . Diverticulosis 10/05/2011  . Hearing loss of both ears 10/05/2011  . Depression 10/05/2011  . Vocal cord paralysis 01/17/2007    Past Medical History  Diagnosis Date  . Depression   . GERD (gastroesophageal reflux disease)   . Enlarged prostate   . 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 Ear infection ear infection lasting past 2 months, still ongoing, pt on antibiotics & drops. pt states "ear infection has eaten holes through bilateral ear drums,  I am hard of hearing"  . Vocal cord paralysis     Right  . Diverticulosis   . Diverticulitis   . Cancer     Past Surgical History  Procedure Laterality Date  . Tumor right side of neck  12/2006    resection of R cervical paraganglioma  . Deep neck lymph node biopsy / excision    .  Knee arthrotomy      has had 5 surgeries right knee  . Right inguinal hernia    . Cholecystectomy    . Lumbar laminectomy/decompression microdiscectomy Left 03/09/2013    Procedure: LUMBAR LAMINECTOMY/DECOMPRESSION MICRODISCECTOMY 1 LEVEL;  Surgeon: Tia Alert, MD;  Location: [facility];  Service: Neurosurgery;  Laterality: Left;  Left lumbar three-four extra-foraminal microdiscectomy  . Hernia repair      History  Substance Use Topics  . Smoking status: Never Smoker   . Smokeless tobacco: Never Used  . Alcohol Use: Yes     Comment: 1 drink a week    Family History  Problem Relation Age of Onset  . Lung cancer Brother   . Cancer Brother     Allergies  Allergen Reactions  . Penicillins Other (See Comments)    Ulcers on eyes    Medication list has been reviewed and updated.  Current Outpatient Prescriptions on File Prior to Visit  Medication Sig Dispense Refill  . albuterol (PROVENTIL HFA;VENTOLIN HFA) 108 (90 BASE) MCG/ACT inhaler Inhale 2 puffs into the lungs every 6 (six) hours as needed for wheezing.  1 Inhaler  0  . augmented betamethasone dipropionate (DIPROLENE AF) 0.05 % cream Apply topically 2 (two) times daily.  30 g  0  . buPROPion (WELLBUTRIN XL) 150 MG 24 hr tablet Take 3 tablets (450 mg total) by mouth every morning.  90 tablet  1  . buPROPion (WELLBUTRIN XL) 150 MG 24 hr tablet TAKE 3 TABLETS (450 MG TOTAL) BY MOUTH DAILY.  90 tablet  5  . diazepam (VALIUM) 5 MG tablet 1 tablet every 6-8 hours as needed for muscles spasm  30 tablet  1  . fluticasone (FLONASE) 50 MCG/ACT nasal spray Place 1 spray into the nose daily.      [name] Kitchen glycopyrrolate (ROBINUL) 1 MG tablet Take 1 mg by mouth 3 (three) times daily.      [name] Kitchen glycopyrrolate (ROBINUL) 1 MG tablet TAKE 1 TABLET BY MOUTH 3 TIMES A DAY  90 tablet  4  . HYDROcodone-acetaminophen (NORCO/VICODIN) 5-325 MG per tablet Take 1 tablet by mouth every 6 (six) hours as needed for pain.      [name] Kitchen lisinopril (PRINIVIL,ZESTRIL) 10  MG tablet Take 1 tablet (10 mg total) by mouth daily.  30 tablet  11  . pravastatin (PRAVACHOL) 20 MG tablet Take 20 mg by mouth daily.      . ranitidine (ZANTAC) 150 MG tablet Take 150 mg by mouth 2 (two) times daily.      . ranitidine (ZANTAC) 150 MG tablet TAKE 1 TABLET BY MOUTH TWICE A DAY  60 tablet  8  . tamsulosin (FLOMAX) 0.4 MG CAPS capsule TAKE 2 CAPSULE BY MOUTH ONCE DAILY  180 capsule  3  . traMADol (ULTRAM) 50 MG tablet Take 1 tablet (50 mg total) by mouth every 8 (eight) hours as needed for pain.  30 tablet  0  . traZODone (DESYREL) 50 MG tablet Take 1 tablet (50 mg total) by mouth at bedtime.  30 tablet  11  . sildenafil (VIAGRA) 100 MG tablet Take 0.5-1 tablets (50-100 mg total) by mouth daily as needed for erectile dysfunction.  5 tablet  11   No current facility-administered medications on file prior to visit.    Review of Systems:  As per HPI, otherwise negative.    Physical Examination: Filed Vitals:   08/21/13 1427  BP: 118/68  Pulse: 60  Temp: 98.1 F (36.7 C)  Resp: 16   Filed Vitals:   08/21/13 1427  Height: 6' 0.5" (1.842 m)  Weight: 215 lb (97.523 kg)   Body mass index is 28.74 kg/(m^2). Ideal Body Weight: Weight in (lb) to have BMI = 25: 186.5   GEN: WDWN, NAD, Non-toxic, Alert & Oriented x 3 HEENT: Atraumatic, Normocephalic.  Ears and Nose: No external deformity. EXTR: No clubbing/cyanosis/edema NEURO: Normal gait. Gross motor and sensory intact upper extremities PSYCH: Normally interactive. Conversant. Not depressed or anxious appearing.  Calm demeanor.  NECK:  Not tender Shoulder:  Right not tender, full ROM.  Assessment and Plan: Ulnar neuritis Anaprox ds Follow up if not improved.  Consider NCS/EMG.  Signed,  Phillips Odor, MD

## 2013-09-23 DIAGNOSIS — H905 Unspecified sensorineural hearing loss: Secondary | ICD-10-CM | POA: Diagnosis not present

## 2013-10-01 ENCOUNTER — Other Ambulatory Visit: Payer: Self-pay | Admitting: Physician Assistant

## 2013-11-23 ENCOUNTER — Other Ambulatory Visit: Payer: Self-pay | Admitting: Emergency Medicine

## 2013-11-26 ENCOUNTER — Ambulatory Visit (INDEPENDENT_AMBULATORY_CARE_PROVIDER_SITE_OTHER): Payer: 59 | Admitting: Physician Assistant

## 2013-11-26 ENCOUNTER — Encounter: Payer: Self-pay | Admitting: Physician Assistant

## 2013-11-26 VITALS — BP 130/82 | HR 76 | Temp 97.6°F | Resp 16 | Ht 73.0 in | Wt 213.2 lb

## 2013-11-26 DIAGNOSIS — E785 Hyperlipidemia, unspecified: Secondary | ICD-10-CM

## 2013-11-26 DIAGNOSIS — G5600 Carpal tunnel syndrome, unspecified upper limb: Secondary | ICD-10-CM

## 2013-11-26 DIAGNOSIS — F3289 Other specified depressive episodes: Secondary | ICD-10-CM

## 2013-11-26 DIAGNOSIS — F329 Major depressive disorder, single episode, unspecified: Secondary | ICD-10-CM

## 2013-11-26 DIAGNOSIS — I10 Essential (primary) hypertension: Secondary | ICD-10-CM

## 2013-11-26 DIAGNOSIS — J3800 Paralysis of vocal cords and larynx, unspecified: Secondary | ICD-10-CM

## 2013-11-26 DIAGNOSIS — F32A Depression, unspecified: Secondary | ICD-10-CM

## 2013-11-26 DIAGNOSIS — J309 Allergic rhinitis, unspecified: Secondary | ICD-10-CM

## 2013-11-26 MED ORDER — GLYCOPYRROLATE 1 MG PO TABS: ORAL_TABLET | ORAL | Status: DC

## 2013-11-26 MED ORDER — LOSARTAN POTASSIUM 50 MG PO TABS
50.0000 mg | ORAL_TABLET | Freq: Every day | ORAL | Status: DC
Start: 2013-11-26 — End: 2015-01-22

## 2013-11-26 MED ORDER — BUPROPION HCL ER (XL) 150 MG PO TB24: ORAL_TABLET | ORAL | Status: DC

## 2013-11-26 MED ORDER — FLUTICASONE PROPIONATE 50 MCG/ACT NA SUSP: 2.0000 | Freq: Every day | NASAL | Status: DC

## 2013-11-26 MED ORDER — RANITIDINE HCL 150 MG PO TABS: ORAL_TABLET | ORAL | Status: DC

## 2013-11-26 NOTE — Patient Instructions (Signed)
Stop the lisinopril.  It may be causing the cough. In it's place, start the losartan. Also, increase the Flonase to 2 sprays in each nostril one time each day.

## 2013-11-26 NOTE — Progress Notes (Signed)
   Subjective:    Patient ID: Chris Welch, male    DOB: 1946/04/13, 68 y.o.   MRN: [id number]   PCP: Jenny Reichmann, MD  Chief Complaint  Patient presents with  . Hyperlipidemia  . Depression    Medications, allergies, past medical history, surgical history, family history, social history and problem list reviewed and updated.  HPI  In general, is doing well.  Needs refills.   Complains of a chronic tickle in his throat.  Can be on either side.  Seems worse in the mornings, and if he can cough up some phlegm it is better.  Generally, though, he's not able to cough adequately to do that.  He has allergies, but only uses the Flonase a couple of times a week.  He also chronically aspirates since excision of a tumor of the RIGHT neck (cervical paraganglioma). It is of note that he also takes lisinopril.  He reports tingling pain in both hands some mornings, and when he rides his motorcycle for very long (he tends to prop his hands up on the bars). He notes that wiggling his fingers and moving the wrists helps, and so does wearing a wrist splint (he only has one for the LEFT).  Review of Systems As above.  No CP, SOB, HA, dizziness. No other myalgias, arthralgias or rash.    Objective:   Physical Exam  Blood pressure 130/82, pulse 76, temperature 97.6 F (36.4 C), temperature source Oral, resp. rate 16, height 6\' 1"  (1.854 m), weight 213 lb 3.2 oz (96.707 kg), SpO2 98.00%. Body mass index is 28.13 kg/(m^2). Well-developed, well nourished WM who is awake, alert and oriented, in NAD. HEENT: [place]/AT, sclera and conjunctiva are clear.   Neck: supple, non-tender, no lymphadenopathy, thyromegaly. Heart: RRR, no murmur Lungs: normal effort, CTA Extremities: no cyanosis, clubbing or edema. Negative Tinel's. Positive Phalen's bilaterally. Skin: warm and dry without rash. Psychologic: good mood and appropriate affect, normal speech and behavior.       Assessment & Plan:  1. Carpal  tunnel syndrome Continue with wrist splints.  Splint for the RIGHT provided today.    2. Hyperlipidemia No labs today.  Will check fasting labs at his next visit.  3. Vocal cord paralysis Continue current treatment. - ranitidine (ZANTAC) 150 MG tablet; TAKE 1 TABLET BY MOUTH TWICE A DAY  Dispense: 180 tablet; Refill: 4 - glycopyrrolate (ROBINUL) 1 MG tablet; TAKE 1 TABLET BY MOUTH 3 TIMES A DAY  Dispense: 90 tablet; Refill: 4  4. Depression Stable. - buPROPion (WELLBUTRIN XL) 150 MG 24 hr tablet; TAKE 3 TABLETS (450 MG TOTAL) BY MOUTH DAILY.  Dispense: 270 tablet; Refill: 4  5. AR (allergic rhinitis) Possibly the cause of his throat tickle.  Increase Flonase to daily. - fluticasone (FLONASE) 50 MCG/ACT nasal spray; Place 2 sprays into both nostrils daily.  Dispense: 48 g; Refill: 4  6. HTN (hypertension) Possible ACE-I cough.  D/C lisinopril.  Start Losartan.  He will monitor his BP at home and let me know if it's routinely above 140/90, and also let me know if the throat tickle persists. - losartan (COZAAR) 50 MG tablet; Take 1 tablet (50 mg total) by mouth daily.  Dispense: 90 tablet; Refill: Louise, PA-C Physician Assistant-Certified [facility]

## 2013-11-28 ENCOUNTER — Other Ambulatory Visit: Payer: Self-pay

## 2013-11-28 MED ORDER — PRAVASTATIN SODIUM 20 MG PO TABS: 20.0000 mg | ORAL_TABLET | Freq: Every day | ORAL | Status: DC

## 2013-12-14 ENCOUNTER — Encounter: Payer: Self-pay | Admitting: *Deleted

## 2013-12-18 ENCOUNTER — Telehealth: Payer: Self-pay

## 2013-12-18 DIAGNOSIS — F329 Major depressive disorder, single episode, unspecified: Secondary | ICD-10-CM

## 2013-12-18 DIAGNOSIS — F32A Depression, unspecified: Secondary | ICD-10-CM

## 2013-12-18 MED ORDER — BUPROPION HCL ER (XL) 150 MG PO TB24: ORAL_TABLET | ORAL | Status: DC

## 2013-12-18 NOTE — Telephone Encounter (Signed)
[facility] sent notice PA needed on bupropion. When I checked w/[employer] I was told that pt has to get through mail order. Contacted pt and advised. He asked me to send it to [facility] outpt to see if it will be covered through them since his is his wife's [facility] ins. Sent Rx there and advised pt that if he has to get through mail order, he may want to pay OOP for a few tablets to cover him until he gets shipment so he won't have withdrawal Sxs stopping abruptly. Pt agreed.

## 2014-01-02 ENCOUNTER — Other Ambulatory Visit: Payer: Self-pay | Admitting: Emergency Medicine

## 2014-01-17 ENCOUNTER — Telehealth: Payer: Self-pay | Admitting: Physician Assistant

## 2014-01-17 MED ORDER — PRAVASTATIN SODIUM 20 MG PO TABS: 20.0000 mg | ORAL_TABLET | Freq: Every day | ORAL | Status: DC

## 2014-01-17 NOTE — Telephone Encounter (Signed)
Patient needs a refill on Glycopyrrolate 1mg . and Pravastin 20 mg. States that Medicare will not pay for them if he gets it filled at [facility]. Can you send these to [facility] instead?   [phone]

## 2014-01-17 NOTE — Telephone Encounter (Signed)
Verified w/[facility] outpt that pt already has RFs of glycopyrrolate on file and will get ready. Sent Pravastatin there also, and notified pt on VM at correct # [phone].

## 2014-03-04 ENCOUNTER — Ambulatory Visit (INDEPENDENT_AMBULATORY_CARE_PROVIDER_SITE_OTHER): Payer: 59 | Admitting: Physician Assistant

## 2014-03-04 ENCOUNTER — Encounter: Payer: Self-pay | Admitting: Physician Assistant

## 2014-03-04 VITALS — BP 120/78 | HR 75 | Temp 98.7°F | Resp 16 | Ht 73.0 in | Wt 209.8 lb

## 2014-03-04 DIAGNOSIS — R5381 Other malaise: Secondary | ICD-10-CM

## 2014-03-04 DIAGNOSIS — I10 Essential (primary) hypertension: Secondary | ICD-10-CM

## 2014-03-04 DIAGNOSIS — F329 Major depressive disorder, single episode, unspecified: Secondary | ICD-10-CM

## 2014-03-04 DIAGNOSIS — K219 Gastro-esophageal reflux disease without esophagitis: Secondary | ICD-10-CM

## 2014-03-04 DIAGNOSIS — F32A Depression, unspecified: Secondary | ICD-10-CM

## 2014-03-04 DIAGNOSIS — F3289 Other specified depressive episodes: Secondary | ICD-10-CM

## 2014-03-04 DIAGNOSIS — R5383 Other fatigue: Secondary | ICD-10-CM

## 2014-03-04 DIAGNOSIS — E785 Hyperlipidemia, unspecified: Secondary | ICD-10-CM

## 2014-03-04 DIAGNOSIS — R079 Chest pain, unspecified: Secondary | ICD-10-CM

## 2014-03-04 DIAGNOSIS — T17308A Unspecified foreign body in larynx causing other injury, initial encounter: Secondary | ICD-10-CM

## 2014-03-04 LAB — COMPREHENSIVE METABOLIC PANEL
ALT: 130 U/L — ABNORMAL HIGH (ref 0–53)
AST: 66 U/L — ABNORMAL HIGH (ref 0–37)
Albumin: 4.1 g/dL (ref 3.5–5.2)
Alkaline Phosphatase: 170 U/L — ABNORMAL HIGH (ref 39–117)
BUN: 17 mg/dL (ref 6–23)
CO2: 27 mEq/L (ref 19–32)
Calcium: 9.1 mg/dL (ref 8.4–10.5)
Chloride: 101 mEq/L (ref 96–112)
Creat: 1.02 mg/dL (ref 0.50–1.35)
Glucose, Bld: 103 mg/dL — ABNORMAL HIGH (ref 70–99)
Potassium: 5 mEq/L (ref 3.5–5.3)
Sodium: 137 mEq/L (ref 135–145)
Total Bilirubin: 1.7 mg/dL — ABNORMAL HIGH (ref 0.2–1.2)
Total Protein: 7.1 g/dL (ref 6.0–8.3)

## 2014-03-04 LAB — CBC WITH DIFFERENTIAL/PLATELET
Basophils Absolute: 0.1 10*3/uL (ref 0.0–0.1)
Basophils Relative: 1 % (ref 0–1)
Eosinophils Absolute: 0.1 10*3/uL (ref 0.0–0.7)
Eosinophils Relative: 2 % (ref 0–5)
HCT: 38.4 % — ABNORMAL LOW (ref 39.0–52.0)
Hemoglobin: 13.1 g/dL (ref 13.0–17.0)
Lymphocytes Relative: 16 % (ref 12–46)
Lymphs Abs: 1.1 10*3/uL (ref 0.7–4.0)
MCH: 29.3 pg (ref 26.0–34.0)
MCHC: 34.1 g/dL (ref 30.0–36.0)
MCV: 85.9 fL (ref 78.0–100.0)
Monocytes Absolute: 0.5 10*3/uL (ref 0.1–1.0)
Monocytes Relative: 7 % (ref 3–12)
Neutro Abs: 5.1 10*3/uL (ref 1.7–7.7)
Neutrophils Relative %: 74 % (ref 43–77)
Platelets: 240 10*3/uL (ref 150–400)
RBC: 4.47 MIL/uL (ref 4.22–5.81)
RDW: 14.3 % (ref 11.5–15.5)
WBC: 6.9 10*3/uL (ref 4.0–10.5)

## 2014-03-04 LAB — LIPID PANEL
Cholesterol: 236 mg/dL — ABNORMAL HIGH (ref 0–200)
HDL: 42 mg/dL (ref >39)
LDL Cholesterol: 176 mg/dL — ABNORMAL HIGH (ref 0–99)
Total CHOL/HDL Ratio: 5.6 Ratio
Triglycerides: 89 mg/dL (ref <150)
VLDL: 18 mg/dL (ref 0–40)

## 2014-03-04 LAB — TSH: TSH: 1.387 u[IU]/mL (ref 0.350–4.500)

## 2014-03-04 MED ORDER — PRAVASTATIN SODIUM 20 MG PO TABS: 20.0000 mg | ORAL_TABLET | Freq: Every day | ORAL | Status: DC

## 2014-03-04 NOTE — Progress Notes (Signed)
Subjective:    Patient ID: Chris Welch, male    DOB: [birth year], 68 y.o.   MRN: [id number]   PCP: Jenny Reichmann, MD, typically sees Harrison Mons, PA-C  Chief Complaint  Patient presents with  . Hyperlipidemia    need refill Pravachol 20 mg  . Hypertension  . Depression  . wrist carpel tunnel syndrome  . Chest Pain    02/06/14    Medications, allergies, past medical history, surgical history, family history, social history and problem list reviewed and updated.  Prior to Admission medications   Medication Sig Start Date End Date Taking? Authorizing Keithon Mccoin  albuterol (PROVENTIL HFA;VENTOLIN HFA) 108 (90 BASE) MCG/ACT inhaler Inhale 2 puffs into the lungs every 6 (six) hours as needed for wheezing. 02/27/13  Yes Robyn Haber, MD  augmented betamethasone dipropionate (DIPROLENE AF) 0.05 % cream Apply topically 2 (two) times daily. 01/07/13  Yes Darlyne Russian, MD  buPROPion (WELLBUTRIN XL) 150 MG 24 hr tablet TAKE 3 TABLETS (450 MG TOTAL) BY MOUTH DAILY. 12/18/13  Yes Chelle S Jeffery, PA-C  fluticasone (FLONASE) 50 MCG/ACT nasal spray Place 2 sprays into both nostrils daily. 11/26/13  Yes Chelle S Jeffery, PA-C  glycopyrrolate (ROBINUL) 1 MG tablet TAKE 1 TABLET BY MOUTH 3 TIMES A DAY 11/26/13  Yes Chelle S Jeffery, PA-C  losartan (COZAAR) 50 MG tablet Take 1 tablet (50 mg total) by mouth daily. 11/26/13  Yes Chelle S Jeffery, PA-C  naproxen sodium (ANAPROX DS) 550 MG tablet Take 1 tablet (550 mg total) by mouth 2 (two) times daily with a meal. 08/21/13 08/21/14 Yes Ellison Carwin, MD  pravastatin (PRAVACHOL) 20 MG tablet Take 1 tablet (20 mg total) by mouth daily. 03/04/14  Yes Chelle S Jeffery, PA-C  ranitidine (ZANTAC) 150 MG tablet TAKE 1 TABLET BY MOUTH TWICE A DAY 11/26/13  Yes Chelle S Jeffery, PA-C  tamsulosin (FLOMAX) 0.4 MG CAPS capsule TAKE 2 CAPSULE BY MOUTH ONCE DAILY 06/19/13  Yes Ryan M Dunn, PA-C  traZODone (DESYREL) 50 MG tablet Take 1 tablet (50 mg total) by mouth  at bedtime. 05/28/13  Yes Chelle S Jeffery, PA-C  VIAGRA 100 MG tablet TAKE 1/2-1 TABLET BY MOUTH DAILY AS NEEDED FOR ERECTILE DYSFUNCTION 10/01/13  Yes Chelle S Jeffery, PA-C  traMADol (ULTRAM) 50 MG tablet Take 1 tablet (50 mg total) by mouth every 8 (eight) hours as needed for pain. 03/08/13   Theda Sers, PA-C   Patient Active Problem List   Diagnosis Date Noted  . Carpal tunnel syndrome 11/26/2013  . HTN (hypertension) 11/26/2013  . AR (allergic rhinitis) 11/26/2013  . Low sodium levels 03/13/2013  . BPH associated with nocturia 08/21/2012  . Hyperlipidemia 08/21/2012  . Recurrent aspiration bronchitis/pneumonia 10/05/2011  . Diverticulosis 10/05/2011  . Hearing loss of both ears 10/05/2011  . Depression 10/05/2011  . Vocal cord paralysis 01/17/2007    HPI  When he wears the wrist splint, the pain resolves, and then he can go several weeks without discomfort. Has been out of Pravastatin x several weeks. Thinks I wanted him to continue it, and is fasting today for labwork.  Legs get tired with minimal exertion.  Not associated with SOB, CP. He's trying to stay active with his family, but can't tolerate much activity.  He's had several episodes of chest pain. The first episode occurred while his family was visiting [place] in [place] and he was transported to the ED there. He had sharp stabbing CP, that caused him to be "doubled  over." Serial troponins were negative. CXR was normal. CTA was negative for pulmonary embolus. EKG portion of nuclear stress test reveals "normal sinus rhythm, prior anteroseptal MI cannot be ruled out. No significant ST-T wave changes. Heart rate 71. Blood pressure 11/69. Indeterminate stress test since target heart rate was not achieved. Correlate with nuclear study reported separately." However, the nuclear study is not included in the papers the patient brought me today, which will be sent for scanning.  He reports 2-3 subsequent episodes, none as bad as the  first. Not associated with CP, dizziness, arm/neck or back pain.  Not associated with any GI symptoms, though he does note that his reflux has been much worse lately.    Review of Systems As above.    Objective:   Physical Exam  Constitutional: He is oriented to person, place, and time. He appears well-developed and well-nourished. No distress.  BP 120/78  Pulse 75  Temp(Src) 98.7 F (37.1 C) (Oral)  Resp 16  Ht 6\' 1"  (1.854 m)  Wt 209 lb 12.8 oz (95.165 kg)  BMI 27.69 kg/m2  SpO2 98%   HENT:  Head: Normocephalic and atraumatic.  Mouth/Throat: No oropharyngeal exudate.  Eyes: Conjunctivae are normal. No scleral icterus.  Neck: Normal range of motion. Neck supple. No thyromegaly present.  Cardiovascular: Normal rate, regular rhythm, normal heart sounds and intact distal pulses.   Pulmonary/Chest: Effort normal and breath sounds normal.  Abdominal: Soft. Bowel sounds are normal. He exhibits no distension and no mass. There is no tenderness. There is no rebound and no guarding.  Lymphadenopathy:    He has no cervical adenopathy.  Neurological: He is alert and oriented to person, place, and time. He displays normal reflexes.  Skin: Skin is warm and dry. No rash noted.  Psychiatric: His behavior is normal. Thought content normal. His mood appears not anxious. His affect is not angry, not blunt, not labile and not inappropriate. He exhibits a depressed mood (less depressed mood than at previous visits).          Assessment & Plan:  1. Hyperlipidemia Restart pravastatin. Continue healthy eating. - pravastatin (PRAVACHOL) 20 MG tablet; Take 1 tablet (20 mg total) by mouth daily.  Dispense: 90 tablet; Refill: 3 - Lipid panel  2. HTN (hypertension) Continue current treatment. - Comprehensive metabolic panel - CBC with Differential  3. Depression Continue current regimen.  He's in the best mood I've seen him in recently.  4. Chest pain Unclear etiology. I suspect this is  Reflux-we know that he chronically refluxes and aspirates, and has been maintained on an H2 blocker in place of a PPI since shortly after his last hospitalization for aspiration pneumonia. - Ambulatory referral to Cardiology  5. Fatigue This leg fatigue is also of uncertain etiology.  If cardiology agrees, I'll plan to refer him to PT to see if some extra conditioning can help. - Ambulatory referral to Cardiology - TSH  6. Gastric reflux with aspiration See above.  Would like to avoid PPI, given his recurrent aspiration pneumonia, but need specialist involvement in the treatment of his current symptoms, especially since I suspect the chest pain he's been having is due to reflux. - Ambulatory referral to Gastroenterology  Return in about 4 months (around 07/04/2014) for re-evaluation.  Fara Chute, PA-C Physician Assistant-Certified [facility]

## 2014-03-04 NOTE — Patient Instructions (Signed)
I will contact you with your lab results as soon as they are available.   If you have not heard from me in 2 weeks, please contact me.  The fastest way to get your results is to register for My Chart (see the instructions on the last page of this printout).   

## 2014-03-07 ENCOUNTER — Encounter: Payer: Self-pay | Admitting: Internal Medicine

## 2014-03-13 ENCOUNTER — Encounter: Payer: Self-pay | Admitting: *Deleted

## 2014-03-20 ENCOUNTER — Encounter: Payer: Self-pay | Admitting: Internal Medicine

## 2014-03-20 ENCOUNTER — Ambulatory Visit (INDEPENDENT_AMBULATORY_CARE_PROVIDER_SITE_OTHER): Payer: 59 | Admitting: Internal Medicine

## 2014-03-20 VITALS — BP 100/68 | HR 72 | Ht 72.5 in | Wt 216.4 lb

## 2014-03-20 DIAGNOSIS — K224 Dyskinesia of esophagus: Secondary | ICD-10-CM

## 2014-03-20 DIAGNOSIS — R1319 Other dysphagia: Secondary | ICD-10-CM

## 2014-03-20 MED ORDER — RANITIDINE HCL 300 MG PO CAPS: 300.0000 mg | ORAL_CAPSULE | Freq: Two times a day (BID) | ORAL | Status: DC

## 2014-03-20 NOTE — Progress Notes (Signed)
Chris Welch [birth year] [id number]  Note: This dictation was prepared with Dragon digital system. Any transcriptional errors that result from this procedure are unintentional.   History of Present Illness: This is a 68 year old white male with increase in gastroesophageal reflux. He has a history of  acid reflux since the parotid gland surgery in April 2008 at [facility]. 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 We don't have any records from [facility]. He describes having modified barium swallow and difficulty in swallowing liquids during the study. He has been on glycopyrrolate 1 mg 3 times a day to decrease the flow of the saliva. He also has been on Ranitidine 150 mg twice a day for reflux. Most recently he wakes up at night with food running back and having problems with heartburn. He was on Anaprox  and 550 mg twice a day but has run out of it. We have seen him in June 2012 and prior to that in 2009 for screening colonoscopy. Tubular adenoma was removed in 2009. He has moderately severe diverticulosis of the left colon. He underwent what sounds like esophageal dilation several years ago   Past Medical History  Diagnosis Date  . Depression   . GERD (gastroesophageal reflux disease)   . Enlarged prostate   . 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 Ear infection ear infection lasting past 2 months, still ongoing, pt on antibiotics & drops. pt states "ear infection has eaten holes through bilateral ear drums,  I am hard of hearing"  . Vocal cord paralysis     Right  . Diverticulosis   . Diverticulitis   . Malignant carotid body tumor   . Hyperlipidemia   . Hypertension   . Pneumonia     x 3    Past Surgical History  Procedure Laterality Date  . Tumor right side of neck  12/2006    resection of R cervical  paraganglioma  . Deep neck lymph node biopsy / excision    . Patella arthroplasty Right     has had 5 surgeries right knee  . Cholecystectomy    . Lumbar laminectomy/decompression microdiscectomy Left 03/09/2013    Procedure: LUMBAR LAMINECTOMY/DECOMPRESSION MICRODISCECTOMY 1 LEVEL;  Surgeon: Chris Moore, MD;  Location: [facility];  Service: Neurosurgery;  Laterality: Left;  Left lumbar three-four extra-foraminal microdiscectomy  . Inguinal hernia repair Right     age 83    Allergies  Allergen Reactions  . Hydrocodone Nausea And Vomiting  . Penicillins Other (See Comments)    Ulcers on eyes    Family history and social history have been reviewed.  Review of Systems: Positive for dysphagia to liquids sometimes to solids. Heartburn. Denies abdominal pain  The remainder of the 10 point ROS is negative except as outlined in the H&P  Physical Exam: General Appearance Well developed, in no distress Eyes  Non icteric  HEENT  Non traumatic, normocephalic  Mouth No lesion, tongue papillated, no cheilosis his voice is somewhat raspy Neck Supple without adenopathy, thyroid not enlarged, no carotid bruits, no JVD Lungs Clear to auscultation bilaterally COR Normal S1, normal S2, regular rhythm, no murmur, quiet precordium Abdomen soft nontender with normoactive bowel sounds Rectal not done Extremities  No pedal edema Skin No lesions Neurological Alert and oriented x 3 Psychological Normal mood and affect  Assessment and Plan:  68 year old white male with increasing gastroesophageal reflux and 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 We will request records from [facility] and review the results of the modified barium swallow. For now we will increase his ranitidine to 300 mg twice a day. He tried taking Prilosec in the past but felt that ranitidine was  more effective. Will also proceed with upper endoscopy to  rule out esophageal stricture. He reports having his esophagus dilated several years ago. We have discussed antireflux measures. I have also suggested to cut back on  glycopyrrolate which causes decreased motility and increasing esophageal reflux he states he needs to decrease his saliva. He would take a when necessary    Chris Welch 03/20/2014

## 2014-03-20 NOTE — Patient Instructions (Addendum)
We have sent the following medications to your pharmacy for you to pick up at your convenience:Ranitidine 300 mg capsules to take one tablet by mouth twice daily.   You have been scheduled for an endoscopy. Please follow written instructions given to you at your visit today. If you use inhalers (even only as needed), please bring them with you on the day of your procedure. Your physician has requested that you go to www.startemmi.com and enter the access code given to you at your visit today. This web site gives a general overview about your procedure. However, you should still follow specific instructions given to you by our office regarding your preparation for the procedure.  Please try to stop or decrease the amount of Robinul that you take daily.  cc: Arlyss Queen, MD, Harrison Mons, PA

## 2014-03-25 ENCOUNTER — Other Ambulatory Visit: Payer: Self-pay | Admitting: Internal Medicine

## 2014-03-26 ENCOUNTER — Encounter: Payer: Self-pay | Admitting: *Deleted

## 2014-03-26 ENCOUNTER — Encounter: Payer: Self-pay | Admitting: Internal Medicine

## 2014-03-26 ENCOUNTER — Ambulatory Visit (AMBULATORY_SURGERY_CENTER): Payer: 59 | Admitting: Internal Medicine

## 2014-03-26 VITALS — BP 156/108 | HR 76 | Temp 98.1°F | Resp 15 | Ht 72.0 in | Wt 216.0 lb

## 2014-03-26 DIAGNOSIS — K222 Esophageal obstruction: Secondary | ICD-10-CM

## 2014-03-26 DIAGNOSIS — R1319 Other dysphagia: Secondary | ICD-10-CM

## 2014-03-26 DIAGNOSIS — K319 Disease of stomach and duodenum, unspecified: Secondary | ICD-10-CM

## 2014-03-26 DIAGNOSIS — K2970 Gastritis, unspecified, without bleeding: Secondary | ICD-10-CM

## 2014-03-26 DIAGNOSIS — K219 Gastro-esophageal reflux disease without esophagitis: Secondary | ICD-10-CM

## 2014-03-26 DIAGNOSIS — K2100 Gastro-esophageal reflux disease with esophagitis, without bleeding: Secondary | ICD-10-CM

## 2014-03-26 DIAGNOSIS — K2990 Gastroduodenitis, unspecified, without bleeding: Secondary | ICD-10-CM

## 2014-03-26 MED ORDER — OMEPRAZOLE 40 MG PO CPDR: 40.0000 mg | DELAYED_RELEASE_CAPSULE | Freq: Two times a day (BID) | ORAL | Status: DC

## 2014-03-26 MED ORDER — SODIUM CHLORIDE 0.9 % IV SOLN: 500.0000 mL | INTRAVENOUS | Status: DC

## 2014-03-26 NOTE — Progress Notes (Signed)
Report to PACU, RN, vss, BBS= Clear.  

## 2014-03-26 NOTE — Patient Instructions (Signed)
Discharge instructions given with verbal understanding. Biopsies taken. Handout on a dilatation diet. Resume previous medications. YOU HAD AN ENDOSCOPIC PROCEDURE TODAY AT [facility]: Refer to the procedure report that was given to you for any specific questions about what was found during the examination.  If the procedure report does not answer your questions, please call your gastroenterologist to clarify.  If you requested that your care partner not be given the details of your procedure findings, then the procedure report has been included in a sealed envelope for you to review at your convenience later.  YOU SHOULD EXPECT: Some feelings of bloating in the abdomen. Passage of more gas than usual.  Walking can help get rid of the air that was put into your GI tract during the procedure and reduce the bloating. If you had a lower endoscopy (such as a colonoscopy or flexible sigmoidoscopy) you may notice spotting of blood in your stool or on the toilet paper. If you underwent a bowel prep for your procedure, then you may not have a normal bowel movement for a few days.  DIET: Your first meal following the procedure should be a light meal and then it is ok to progress to your normal diet.  A half-sandwich or bowl of soup is an example of a good first meal.  Heavy or fried foods are harder to digest and may make you feel nauseous or bloated.  Likewise meals heavy in dairy and vegetables can cause extra gas to form and this can also increase the bloating.  Drink plenty of fluids but you should avoid alcoholic beverages for 24 hours.  ACTIVITY: Your care partner should take you home directly after the procedure.  You should plan to take it easy, moving slowly for the rest of the day.  You can resume normal activity the day after the procedure however you should NOT DRIVE or use heavy machinery for 24 hours (because of the sedation medicines used during the test).    SYMPTOMS TO REPORT  IMMEDIATELY: A gastroenterologist can be reached at any hour.  During normal business hours, 8:30 AM to 5:00 PM Monday through Friday, call [phone].  After hours and on weekends, please call the GI answering service at [phone] who will take a message and have the physician on call contact you.    Following upper endoscopy (EGD)  Vomiting of blood or coffee ground material  New chest pain or pain under the shoulder blades  Painful or persistently difficult swallowing  New shortness of breath  Fever of 100F or higher  Black, tarry-looking stools  FOLLOW UP: If any biopsies were taken you will be contacted by phone or by letter within the next 1-3 weeks.  Call your gastroenterologist if you have not heard about the biopsies in 3 weeks.  Our staff will call the home number listed on your records the next business day following your procedure to check on you and address any questions or concerns that you may have at that time regarding the information given to you following your procedure. This is a courtesy call and so if there is no answer at the home number and we have not heard from you through the emergency physician on call, we will assume that you have returned to your regular daily activities without incident.  SIGNATURES/CONFIDENTIALITY: You and/or your care partner have signed paperwork which will be entered into your electronic medical record.  These signatures attest to the fact that that  the information above on your After Visit Summary has been reviewed and is understood.  Full responsibility of the confidentiality of this discharge information lies with you and/or your care-partner.

## 2014-03-26 NOTE — Progress Notes (Signed)
Called to room to assist during endoscopic procedure.  Patient ID and intended procedure confirmed with present staff. Received instructions for my participation in the procedure from the performing physician.  

## 2014-03-26 NOTE — Op Note (Addendum)
[place]  [address] [place], [id number]   ENDOSCOPY PROCEDURE REPORT  PATIENT: Chris Welch, Chris Welch  MR#: [id number] BIRTHDATE: March 26, 1946 , 72  yrs. old GENDER: Male ENDOSCOPIST: Lafayette Dragon, MD REFERRED BY:  Daphane Shepherd, PA PROCEDURE DATE:  03/26/2014 PROCEDURE:  EGD w/ biopsy and Savary dilation of esophagus ASA CLASS:     Class II INDICATIONS:  Dysphagia  to solids.  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  MEDICATIONS: MAC sedation, administered by CRNA and propofol (Diprivan) 100mg  IV TOPICAL ANESTHETIC: none  DESCRIPTION OF PROCEDURE: After the risks benefits and alternatives of the procedure were thoroughly explained, informed consent was obtained.  The LB [id number] endoscope was introduced through the mouth and advanced to the second portion of the duodenum. Without limitations.  The instrument was slowly withdrawn as the mucosa was fully examined.      Esophagus: Brief examination of the pharynx and  proximal esophagus was normal . There were long linear erosions in distal esophagus consistent with grade 2 reflux esophagitis. There was increased fibrous tissue forming mild nonobstructing esophageal stricture. Endoscope traversed into the stomach without resistance. Multiple biopsies were obtained from the stricture and esophageal erosions to rule out Barrett's esophagus Stomach: the gastric folds were normal. There were patches of erythema converging into pyloric outlet which were consistent with gastritis. Biopsies were obtained to rule out H. pylori. Retroflexion of the endoscope revealed normal fundus and cardia  Duodenum: duodenal bulb and descending duodenum was normal  The endoscope was then brought back into the stomach and the guidewire was  placed in the antrum.  Savary dilators 15, 16 and 17 mm passed over the guidewire through the stricture with mild resistance. There was small amount of blood on the dilator[ The scope was then withdrawn from the patient and the procedure completed.  COMPLICATIONS: There were no complications. ENDOSCOPIC IMPRESSION: 1. Grade 2 reflux esophagitis. Status post biopsies 2. Nonobstructing distal esophageal stricture. Status post post-dilation to 17 mm without fluoroscopic guidance 3. Mild antral gastritis. Status post biopsies to r/o H.Pylori  Patient's  dysphagia is a result of multiple factors one of them being a mild stricture the other is  esophageal dis- motility due to prior neck surgery. I will review the records from [facility] which had just arrived. And will decide if a repeat modified barium is indicated . We need to increase his acid supressing regimen. RECOMMENDATIONS: 1.  Await pathology results 2.  Anti-reflux regimen to be follow 3.  Prilosec 40 mg by mouth twice a day 4.followup office visit in 6 weeks  REPEAT EXAM: for EGD pending biopsy results.  eSigned:  Lafayette Dragon, MD 05/11/2014 8:41 PM Revised: 05/11/2014 8:41 PM  CC:  PATIENT NAME:  Darick, Fetters MR#: [id number]

## 2014-03-27 ENCOUNTER — Telehealth: Payer: Self-pay | Admitting: *Deleted

## 2014-03-27 NOTE — Telephone Encounter (Signed)
  Follow up Call-  Call back number 03/26/2014  Post procedure Call Back phone  # [phone]  Permission to leave phone message Yes    [facility]

## 2014-03-28 ENCOUNTER — Encounter: Payer: Self-pay | Admitting: *Deleted

## 2014-04-01 ENCOUNTER — Encounter: Payer: Self-pay | Admitting: Internal Medicine

## 2014-04-03 ENCOUNTER — Encounter: Payer: Self-pay | Admitting: *Deleted

## 2014-04-22 ENCOUNTER — Encounter: Payer: Self-pay | Admitting: Cardiology

## 2014-04-22 ENCOUNTER — Ambulatory Visit (INDEPENDENT_AMBULATORY_CARE_PROVIDER_SITE_OTHER): Payer: 59 | Admitting: Cardiology

## 2014-04-22 VITALS — BP 130/82 | HR 75 | Ht 75.0 in | Wt 219.0 lb

## 2014-04-22 DIAGNOSIS — E785 Hyperlipidemia, unspecified: Secondary | ICD-10-CM

## 2014-04-22 DIAGNOSIS — I10 Essential (primary) hypertension: Secondary | ICD-10-CM

## 2014-04-22 DIAGNOSIS — R0789 Other chest pain: Secondary | ICD-10-CM | POA: Insufficient documentation

## 2014-04-22 MED ORDER — HYDROCHLOROTHIAZIDE 12.5 MG PO CAPS: 12.5000 mg | ORAL_CAPSULE | Freq: Every day | ORAL | Status: DC

## 2014-04-22 NOTE — Patient Instructions (Signed)
Please start HCTZ 12.5 mg a day. Continue all other medications as listed.  Follow up in 6 months with Dr Marlou Porch.  You will receive a letter in the mail 2 months before you are due.  Please call us when you receive this letter to schedule your follow up appointment.

## 2014-04-22 NOTE — Progress Notes (Signed)
[place], [address], [place]  [address] Phone: [phone] Fax:  [phone]  Date:  04/22/2014   ID:  Chris Welch, DOB 01-28-1946, MRN [id number]  PCP:  Jenny Reichmann, MD   History of Present Illness: Chris Welch is a 68 y.o. male seen last in December of 2012. Few months ago began with chest pain, central with stabbing in his back. Duration usually 1-81min. On vacation when he felt. Had eaten breakfast, going up in elevator and this pain came up suddenly. Hurt all the way to room. Finally stopped. 5 minutes later happened again. Had light episodes at the park. No diaphoresis. No SOB.   No early CAD family history.  HTN better control. Takes at home, 165/110. Better now. +Hyperlipidemia, non smoker.  [name] Kitchen  He went under stress testing in [place] 6/15 which was low risk (same in August of 2012). No evidence of ischemia.   He states that he does have occasional GERD especially after eating hot and spicy foods. TUMS seem to help. This is nonexertional discomfort.. Dr. Olevia Perches.     Wt Readings from Last 3 Encounters:  04/22/14 219 lb (99.338 kg)  03/26/14 216 lb (97.977 kg)  03/20/14 216 lb 6 oz (98.147 kg)     Past Medical History  Diagnosis Date  . Depression   . GERD (gastroesophageal reflux disease)   . Enlarged prostate   . 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 Ear infection ear infection lasting past 2 months, still ongoing, pt on antibiotics & drops. pt states "ear infection has eaten holes through bilateral ear drums,  I am hard of hearing"  . Vocal cord paralysis     Right  . Diverticulosis   . Diverticulitis   . Malignant carotid body tumor   . Hyperlipidemia   . Hypertension   . Pneumonia     x 3  . Esophageal stricture   . Gastropathy     reactive    Past Surgical History  Procedure Laterality Date  . Tumor right side of neck  12/2006    resection of R cervical paraganglioma  . Deep neck  lymph node biopsy / excision    . Patella arthroplasty Right     has had 5 surgeries right knee  . Cholecystectomy    . Lumbar laminectomy/decompression microdiscectomy Left 03/09/2013    Procedure: LUMBAR LAMINECTOMY/DECOMPRESSION MICRODISCECTOMY 1 LEVEL;  Surgeon: Eustace Moore, MD;  Location: [place] [facility];  Service: Neurosurgery;  Laterality: Left;  Left lumbar three-four extra-foraminal microdiscectomy  . Inguinal hernia repair Right     age 60    Current Outpatient Prescriptions  Medication Sig Dispense Refill  . albuterol (PROVENTIL HFA;VENTOLIN HFA) 108 (90 BASE) MCG/ACT inhaler Inhale 2 puffs into the lungs every 6 (six) hours as needed for wheezing.  1 Inhaler  0  . aspirin EC 81 MG tablet Take 81 mg by mouth.      [name] Kitchen augmented betamethasone dipropionate (DIPROLENE AF) 0.05 % cream Apply topically 2 (two) times daily.  30 g  0  . buPROPion (WELLBUTRIN XL) 150 MG 24 hr tablet TAKE 3 TABLETS (450 MG TOTAL) BY MOUTH DAILY.  270 tablet  4  . fluticasone (FLONASE) 50 MCG/ACT nasal spray Place 2 sprays into both nostrils daily.  48 g  4  . glycopyrrolate (ROBINUL) 1 MG tablet TAKE 1 TABLET BY MOUTH 3 TIMES A DAY  90 tablet  4  . losartan (COZAAR) 50 MG tablet Take 1 tablet (50 mg total) by mouth daily.  90 tablet  4  . naproxen sodium (ANAPROX DS) 550 MG tablet Take 1 tablet (550 mg total) by mouth 2 (two) times daily with a meal.  40 tablet  0  . omeprazole (PRILOSEC) 40 MG capsule Take 1 capsule (40 mg total) by mouth 2 (two) times daily.  60 capsule  3  . pravastatin (PRAVACHOL) 20 MG tablet Take 1 tablet (20 mg total) by mouth daily.  90 tablet  3  . tamsulosin (FLOMAX) 0.4 MG CAPS capsule TAKE 2 CAPSULE BY MOUTH ONCE DAILY  180 capsule  3  . traMADol (ULTRAM) 50 MG tablet Take 1 tablet (50 mg total) by mouth every 8 (eight) hours as needed for pain.  30 tablet  0  . traZODone (DESYREL) 50 MG tablet Take 1 tablet (50 mg total) by mouth at bedtime.  30 tablet  11  . VIAGRA 100 MG tablet  TAKE 1/2-1 TABLET BY MOUTH DAILY AS NEEDED FOR ERECTILE DYSFUNCTION  5 tablet  11   No current facility-administered medications for this visit.    Allergies:    Allergies  Allergen Reactions  . Hydrocodone Nausea And Vomiting  . Penicillins Other (See Comments) and Rash    Ulcers on eyes    Social History:  The patient  reports that he has never smoked. He has never used smokeless tobacco. He reports that he drinks alcohol. He reports that he does not use illicit drugs.   Family History  Problem Relation Age of Onset  . Arthritis Brother     back  . Lung cancer Brother 30     (dx to death: 30 days)  . Cancer Brother     rare cancer-unknown type    ROS:  Please see the history of present illness.   Denies any syncope, bleeding, orthopnea, PND, strokelike symptoms. Positive for GERD. Atypical chest pain.   All other systems reviewed and negative.   PHYSICAL EXAM: VS:  BP 130/82  Pulse 75  Ht 6\' 3"  (1.905 m)  Wt 219 lb (99.338 kg)  BMI 27.37 kg/m2 Well nourished, well developed, in no acute distress HEENT: normal, [place]/AT, EOMI Neck: no JVD, normal carotid upstroke, no bruit Cardiac:  normal S1, S2; RRR; no murmur Lungs:  clear to auscultation bilaterally, no wheezing, rhonchi or rales Abd: soft, nontender, no hepatomegaly, no bruits Ext: Trace ankle bilateral edema, 2+ distal pulses Skin: warm and dry GU: deferred Neuro: no focal abnormalities noted, AAO x 3  EKG:  04/22/14-normal sinus rhythm, 79, septal Q waves    Nuclear stress test 6/15 (done in [place])--low risk, no ischemia  ASSESSMENT AND PLAN:  1. Atypical chest pain-pain was sharp in character, somewhat fleeting, nonexertional, at times associated with eating. Nuclear stress test was low risk, no ischemia. Prior nuclear stress test in 2012 was the same. He has had one episode since then while on the golf [place], less severe. If symptoms become more typical or more worrisome, we will likely proceed with cardiac  catheterization. I discussed with him and his wife. Continuing to aggressively modify risk factors. 2. Hypertension, essential-controlled during his office visit however at home it is occasionally elevated. I will add low-dose HCTZ 12.5 mg to his drug regimen which includes angiotensin receptor blocker. This will also help with his mild trace pitting edema of his ankles. 3. Hyperlipidemia-his LDL off of medication was 176. On medication was 96.  Continue with pravastatin. I would like to recheck this in 6 months when he comes back. 4. 6 month follow up.  Signed, Candee Furbish, MD [facility]  04/22/2014 3:09 PM

## 2014-05-07 ENCOUNTER — Ambulatory Visit (INDEPENDENT_AMBULATORY_CARE_PROVIDER_SITE_OTHER): Payer: 59 | Admitting: Emergency Medicine

## 2014-05-07 VITALS — BP 128/80 | HR 87 | Temp 98.0°F | Resp 17 | Ht 73.0 in | Wt 211.0 lb

## 2014-05-07 DIAGNOSIS — H6093 Unspecified otitis externa, bilateral: Secondary | ICD-10-CM

## 2014-05-07 DIAGNOSIS — H60399 Other infective otitis externa, unspecified ear: Secondary | ICD-10-CM

## 2014-05-07 MED ORDER — OFLOXACIN 0.3 % OT SOLN: 5.0000 [drp] | Freq: Every day | OTIC | Status: DC

## 2014-05-07 NOTE — Progress Notes (Signed)
Subjective:  This chart was scribed for Darlyne Russian, MD by Ladene Artist, ED Scribe. The patient was seen in room 10. Patient's care was started at 8:18 AM.   Patient ID: Chris Welch, male    DOB: 1946/01/30, 68 y.o.   MRN: [id number]  Chief Complaint  Patient presents with  . Ear Injury   HPI HPI Comments: Chris Welch is a 68 y.o. male who presents to the [facility] for persistent bilateral ear injury. Pt states that he has been seeing Dr. Constance Holster, ENT, who usually applies a powder in his ear. Pt requests a referral to [facility] today to continue seeing Dr. Constance Holster per request of his insurance, [employer]. Pt has also tried prescribed ear drops in the past with relief.   Past Medical History  Diagnosis Date  . Depression   . GERD (gastroesophageal reflux disease)   . Enlarged prostate   . 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 Ear infection ear infection lasting past 2 months, still ongoing, pt on antibiotics & drops. pt states "ear infection has eaten holes through bilateral ear drums,  I am hard of hearing"  . Vocal cord paralysis     Right  . Diverticulosis   . Diverticulitis   . Malignant carotid body tumor   . Hyperlipidemia   . Hypertension   . Pneumonia     x 3  . Esophageal stricture   . Gastropathy     reactive   Current Outpatient Prescriptions on File Prior to Visit  Medication Sig Dispense Refill  . albuterol (PROVENTIL HFA;VENTOLIN HFA) 108 (90 BASE) MCG/ACT inhaler Inhale 2 puffs into the lungs every 6 (six) hours as needed for wheezing.  1 Inhaler  0  . aspirin EC 81 MG tablet Take 81 mg by mouth.      [name] Kitchen augmented betamethasone dipropionate (DIPROLENE AF) 0.05 % cream Apply topically 2 (two) times daily.  30 g  0  . buPROPion (WELLBUTRIN XL) 150 MG 24 hr tablet TAKE 3 TABLETS (450 MG TOTAL) BY MOUTH DAILY.  270 tablet  4  . fluticasone (FLONASE) 50 MCG/ACT nasal spray Place 2 sprays into both  nostrils daily.  48 g  4  . glycopyrrolate (ROBINUL) 1 MG tablet TAKE 1 TABLET BY MOUTH 3 TIMES A DAY  90 tablet  4  . hydrochlorothiazide (MICROZIDE) 12.5 MG capsule Take 1 capsule (12.5 mg total) by mouth daily.  90 capsule  3  . losartan (COZAAR) 50 MG tablet Take 1 tablet (50 mg total) by mouth daily.  90 tablet  4  . naproxen sodium (ANAPROX DS) 550 MG tablet Take 1 tablet (550 mg total) by mouth 2 (two) times daily with a meal.  40 tablet  0  . omeprazole (PRILOSEC) 40 MG capsule Take 1 capsule (40 mg total) by mouth 2 (two) times daily.  60 capsule  3  . pravastatin (PRAVACHOL) 20 MG tablet Take 1 tablet (20 mg total) by mouth daily.  90 tablet  3  . tamsulosin (FLOMAX) 0.4 MG CAPS capsule TAKE 2 CAPSULE BY MOUTH ONCE DAILY  180 capsule  3  . traMADol (ULTRAM) 50 MG tablet Take 1 tablet (50 mg total) by mouth every 8 (eight) hours as needed for pain.  30 tablet  0  . traZODone (DESYREL) 50 MG tablet Take 1 tablet (50 mg total) by mouth at bedtime.  30 tablet  11  . VIAGRA  100 MG tablet TAKE 1/2-1 TABLET BY MOUTH DAILY AS NEEDED FOR ERECTILE DYSFUNCTION  5 tablet  11   No current facility-administered medications on file prior to visit.   Allergies  Allergen Reactions  . Hydrocodone Nausea And Vomiting  . Penicillins Other (See Comments) and Rash    Ulcers on eyes   Review of Systems  Constitutional: Negative for fatigue and unexpected weight change.  HENT: Positive for ear pain.       Objective:   Physical Exam CONSTITUTIONAL: Well developed/well nourished HEAD: Normocephalic/atraumatic EYES: EOMI/PERRL ENMT: purulent material in both external auditory canals, drums are not well seen due to purulent material, bilateral hearing aids NECK: supple no meningeal signs SPINE:entire spine nontender CV: S1/S2 noted, no murmurs/rubs/gallops noted LUNGS: Lungs are clear to auscultation bilaterally, no apparent distress ABDOMEN: soft, nontender, no rebound or guarding GU:no cva  tenderness NEURO: Pt is awake/alert, moves all extremitiesx4 EXTREMITIES: pulses normal, full ROM SKIN: warm, color normal PSYCH: no abnormalities of mood noted    Assessment & Plan:  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 He is placed on Floxin otic to use until he can see Dr. Constance Holster his ear nose and throat doctor I personally performed the services described in this documentation, which was scribed in my presence. The recorded information has been reviewed and is accurate.

## 2014-05-07 NOTE — Patient Instructions (Signed)

## 2014-05-13 ENCOUNTER — Encounter: Payer: Self-pay | Admitting: Internal Medicine

## 2014-05-13 ENCOUNTER — Ambulatory Visit (INDEPENDENT_AMBULATORY_CARE_PROVIDER_SITE_OTHER): Payer: 59 | Admitting: Internal Medicine

## 2014-05-13 VITALS — BP 112/66 | HR 76 | Ht 72.5 in | Wt 215.5 lb

## 2014-05-13 DIAGNOSIS — K2100 Gastro-esophageal reflux disease with esophagitis, without bleeding: Secondary | ICD-10-CM

## 2014-05-13 NOTE — Progress Notes (Signed)
Chris Welch 07-18-1946 [id number]  Note: This dictation was prepared with Dragon digital system. Any transcriptional errors that result from this procedure are unintentional.   History of Present Illness:  This is a 68 year old white male with dysphagia and aspiration. I have been able to review his old records from [facility] from [place] in 2009. 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 The vagus nerve had to be sacrificed. A barium esophagram in June 2009 showed free barium aspiration secondary to pharyngeal dysfunction. There was barium stasis. The exam was not completed due to aspiration. An upper endoscopy last month showed grade 2 reflux esophagitis, no Barrett's esophagus. He was on  Zantac 150 mg twice a day. He was dilated with 16 and 17 mm dilators and was started on Prilosec 40 mg twice a day with excellent results. All of his symptoms went away. He currently denies heartburn, dysphagia or any problems with swallowing or coughing. He is very happy with the results. He is even been able to eat foods such as hot chicken wings which he couldn't eat before.     Past Medical History  Diagnosis Date  . Depression   . GERD (gastroesophageal reflux disease)   . Enlarged prostate   . 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 Ear infection ear infection lasting past 2 months, still ongoing, pt on antibiotics & drops. pt states "ear infection has eaten holes through bilateral ear drums,  I am hard of hearing"  . Vocal cord paralysis     Right  . Diverticulosis   . Diverticulitis   . Malignant carotid body tumor   . Hyperlipidemia   . Hypertension   . Pneumonia     x 3  . Esophageal stricture   . Gastropathy     reactive    Past Surgical History  Procedure Laterality Date  . Tumor right side of neck  12/2006    resection of R cervical paraganglioma  . Deep neck lymph node  biopsy / excision    . Patella arthroplasty Right     has had 5 surgeries right knee  . Cholecystectomy    . Lumbar laminectomy/decompression microdiscectomy Left 03/09/2013    Procedure: LUMBAR LAMINECTOMY/DECOMPRESSION MICRODISCECTOMY 1 LEVEL;  Surgeon: Eustace Moore, MD;  Location: [place] [facility];  Service: Neurosurgery;  Laterality: Left;  Left lumbar three-four extra-foraminal microdiscectomy  . Inguinal hernia repair Right     age 62    Allergies  Allergen Reactions  . Hydrocodone Nausea And Vomiting  . Penicillins Other (See Comments) and Rash    Ulcers on eyes    Family history and social history have been reviewed.  Review of Systems:  denies chest pain cough or dysphagia  The remainder of the 10 point ROS is negative except as outlined in the H&P  Physical Exam: General Appearance Well developed, in no distress Eyes  Non icteric  HEENT  Non traumatic, normocephalic  Mouth No lesion, tongue papillated, no cheilosis Neck Supple without adenopathy, thyroid not enlarged, no carotid bruits, no JVD Lungs Clear to auscultation bilaterally COR Normal S1, normal S2, regular rhythm, no murmur, quiet precordium Abdomen  not examined  Rectal  not done  Extremities  No pedal edema Skin No lesions Neurological Alert and oriented x 3 Psychological Normal mood and affect  Assessment and Plan:   Problem #24 68 year old white male with grade  2 reflux esophagitis on a recent upper endoscopy. He is now doing well on omeprazole 40 mg twice a day. He may even be able to reduce the dose to 1 a day. No further GI evaluation is needed at this time. He needs to follow antireflux measures. We will see him when necessary.  Problem #2 Colorectal screening. Patient's last colonoscopy was in March 2009. He is up-to-date on his colonoscopy.    [name] 05/13/2014

## 2014-05-13 NOTE — Patient Instructions (Addendum)
CC: Dr Nena Jordan, Harrison Mons, PA-C

## 2014-05-20 ENCOUNTER — Telehealth: Payer: Self-pay | Admitting: Emergency Medicine

## 2014-05-20 NOTE — Telephone Encounter (Signed)
Patient came into office to inquire about a referral to ENT. States that Dr. Everlene Farrier referred him to an ENT specialist about 3 weeks ago and now his ear problems have resurfaced again. Patient states that his old referral expired and Medicare will not cover his visit without a new referral. States that Dr. Everlene Farrier agreed to write another referral however the ENT office has not gotten anything from [facility] yet. Please call patient if there is any information on that.  Thank  You, Jasmine    Please note: I tried to send this to the Referrals pool but was unable to route it there.

## 2014-05-20 NOTE — Telephone Encounter (Signed)
Pt has an appt with dr Constance Holster on 05/29/14 at 250 per referral in system Referral has been completed.

## 2014-07-04 ENCOUNTER — Other Ambulatory Visit: Payer: Self-pay

## 2014-07-08 ENCOUNTER — Ambulatory Visit (INDEPENDENT_AMBULATORY_CARE_PROVIDER_SITE_OTHER): Payer: 59 | Admitting: Emergency Medicine

## 2014-07-08 ENCOUNTER — Ambulatory Visit: Payer: Commercial Managed Care - HMO | Admitting: Physician Assistant

## 2014-07-08 ENCOUNTER — Encounter: Payer: Self-pay | Admitting: Emergency Medicine

## 2014-07-08 VITALS — BP 96/66 | HR 92 | Temp 97.8°F | Resp 16 | Ht 73.0 in | Wt 213.8 lb

## 2014-07-08 DIAGNOSIS — Z23 Encounter for immunization: Secondary | ICD-10-CM

## 2014-07-08 NOTE — Progress Notes (Signed)
Subjective:  This chart was scribed for Arlyss Queen, MD by Donato Schultz, Medical Scribe. This patient was seen in Room 21 and the patient's care was started at 1:07 PM.   Patient ID: Chris Welch, male    DOB: Feb 15, 1946, 68 y.o.   MRN: [id number]  HPI HPI Comments: Chris Welch is a 68 y.o. male who presents to the [facility] for a follow-up.  He is still complaining of intermittent hearing loss from both ears.  He has been to ENT to drain his ears and will experience relief from his symptoms for a couple of months.  He has another appointment in a couple of months.  He is complaining of intermittent left heel pain that radiates to the tips of his left toes.  He denies any recent injury to the area.  He states that he will experience the pain when he first stands up and it will eventually subside.  He also states that he has been getting tired more easily.  He has seen Dr. Wynne Dust and Dr. Olevia Perches.  Dr. Olevia Perches put him on a 40mg  dosage of Prilosec and he has not experienced any indigestion since.  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  His surgeon was Dr. Conley Canal at [facility].    Patient Active Problem List   Diagnosis Date Noted  . Atypical chest pain 04/22/2014  . Hyperlipemia 04/22/2014  . Gastric reflux with aspiration 03/04/2014  . Carpal tunnel syndrome 11/26/2013  . HTN (hypertension) 11/26/2013  . AR (allergic rhinitis) 11/26/2013  . Low sodium levels 03/13/2013  . BPH associated with nocturia 08/21/2012  . Hyperlipidemia 08/21/2012  . Recurrent aspiration bronchitis/pneumonia 10/05/2011  . Diverticulosis 10/05/2011  . Hearing loss of both ears 10/05/2011  . Depression 10/05/2011  . Vocal cord paralysis 01/17/2007   Past Medical History  Diagnosis Date  . Depression   . GERD (gastroesophageal reflux disease)   . Enlarged prostate   . 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 Ear infection ear infection lasting past 2 months, still ongoing, pt on antibiotics & drops. pt states "ear infection has eaten holes through bilateral ear drums,  I am hard of hearing"  . Vocal cord paralysis     Right  . Diverticulosis   . Diverticulitis   . Malignant carotid body tumor   . Hyperlipidemia   . Hypertension   . Pneumonia     x 3  . Esophageal stricture   . Gastropathy     reactive   Past Surgical History  Procedure Laterality Date  . Tumor right side of neck  12/2006    resection of R cervical paraganglioma  . Deep neck lymph node biopsy / excision    . Patella arthroplasty Right     has had 5 surgeries right knee  . Cholecystectomy    . Lumbar laminectomy/decompression microdiscectomy Left 03/09/2013    Procedure: LUMBAR LAMINECTOMY/DECOMPRESSION MICRODISCECTOMY 1 LEVEL;  Surgeon: Eustace Moore, MD;  Location: [place] [facility];  Service: Neurosurgery;  Laterality: Left;  Left lumbar three-four extra-foraminal microdiscectomy  . Inguinal hernia repair Right     age 14   Allergies  Allergen Reactions  . Hydrocodone Nausea And Vomiting  . Penicillins Other (See Comments) and Rash    Ulcers on eyes   Prior to Admission medications   Medication Sig Start Date End Date Taking? Authorizing Provider  albuterol (PROVENTIL HFA;VENTOLIN HFA) 108 (90 BASE) MCG/ACT inhaler Inhale 2 puffs into the lungs every 6 (six) hours as needed for wheezing. 02/27/13  Yes Robyn Haber, MD  aspirin EC 81 MG tablet Take 81 mg by mouth.   Yes Historical Provider, MD  augmented betamethasone dipropionate (DIPROLENE AF) 0.05 % cream Apply topically 2 (two) times daily. 01/07/13  Yes Darlyne Russian, MD  buPROPion (WELLBUTRIN XL) 150 MG 24 hr tablet TAKE 3 TABLETS (450 MG TOTAL) BY MOUTH DAILY. 12/18/13  Yes Chelle S Jeffery, PA-C  fluticasone (FLONASE) 50 MCG/ACT nasal spray Place 2 sprays into both nostrils daily. 11/26/13  Yes Chelle S Jeffery, PA-C  glycopyrrolate (ROBINUL) 1 MG  tablet TAKE 1 TABLET BY MOUTH 3 TIMES A DAY 11/26/13  Yes Chelle S Jeffery, PA-C  hydrochlorothiazide (MICROZIDE) 12.5 MG capsule Take 1 capsule (12.5 mg total) by mouth daily. 04/22/14  Yes Candee Furbish, MD  losartan (COZAAR) 50 MG tablet Take 1 tablet (50 mg total) by mouth daily. 11/26/13  Yes Chelle S Jeffery, PA-C  naproxen sodium (ANAPROX DS) 550 MG tablet Take 1 tablet (550 mg total) by mouth 2 (two) times daily with a meal. 08/21/13 08/21/14 Yes Roselee Culver, MD  ofloxacin (FLOXIN) 0.3 % otic solution Place 5 drops into both ears daily. 05/07/14  Yes Darlyne Russian, MD  omeprazole (PRILOSEC) 40 MG capsule Take 1 capsule (40 mg total) by mouth 2 (two) times daily. 03/26/14  Yes Lafayette Dragon, MD  pravastatin (PRAVACHOL) 20 MG tablet Take 1 tablet (20 mg total) by mouth daily. 03/04/14  Yes Chelle S Jeffery, PA-C  tamsulosin (FLOMAX) 0.4 MG CAPS capsule TAKE 2 CAPSULE BY MOUTH ONCE DAILY 06/19/13  Yes Ryan M Dunn, PA-C  traMADol (ULTRAM) 50 MG tablet Take 1 tablet (50 mg total) by mouth every 8 (eight) hours as needed for pain. 03/08/13  Yes Eleanore E Elana Alm, PA-C  traZODone (DESYREL) 50 MG tablet Take 1 tablet (50 mg total) by mouth at bedtime. 05/28/13  Yes Chelle S Jeffery, PA-C  VIAGRA 100 MG tablet TAKE 1/2-1 TABLET BY MOUTH DAILY AS NEEDED FOR ERECTILE DYSFUNCTION 10/01/13  Yes Chelle Janalee Dane, PA-C   History   Social History  . Marital Status: Married    Spouse Name: Marlowe Kays Gockley    Number of Children: 4  . Years of Education: 16   Occupational History  . Machinist     [employer]   Social History Main Topics  . Smoking status: Never Smoker   . Smokeless tobacco: Never Used  . Alcohol Use: Yes     Comment: 1 drink a day  . Drug Use: No  . Sexual Activity: Yes    Partners: Female   Other Topics Concern  . Not on file   Social History Narrative   Lives with his wife.  Three children are grown.  Younger daughter will graduate from [employer] in 01/2013.  Both sons and oldest daughter  are in [employer]. Exercises regularly.     Review of Systems  HENT: Positive for hearing loss.   Musculoskeletal: Positive for arthralgias.    Objective:  Physical Exam  Nursing note and vitals reviewed. Constitutional: He is oriented to person, place, and time. He appears well-developed and well-nourished.  Hoarse.  HENT:  Head: Normocephalic and atraumatic.  Right Ear: Tympanic membrane, external ear and ear canal normal.  Left Ear: Tympanic membrane, external ear and ear canal normal.  Left ear canal is swollen without exudate.  Right ear canal is swollen with some exudate.  Eyes: EOM are normal.  Neck: Normal range of motion.  Cardiovascular: Normal rate, regular rhythm and normal heart sounds.   No murmur heard. Pulmonary/Chest: Effort normal and breath sounds normal. No respiratory distress. He has no wheezes. He has no rales.  Musculoskeletal: Normal range of motion.  Left heel has minimal tenderness at the achilles attachment and the base of the medial heel.  Neurological: He is alert and oriented to person, place, and time.  Skin: Skin is warm and dry.  Psychiatric: He has a normal mood and affect. His behavior is normal.    BP 96/66  Pulse 92  Temp(Src) 97.8 F (36.6 C) (Oral)  Resp 16  Ht 6\' 1"  (1.854 m)  Wt 213 lb 12.8 oz (96.979 kg)  BMI 28.21 kg/m2  SpO2 96% Assessment & Plan:  Patient is doing well.  He will contact Dr. Constance Holster for treatment of his chronic external otitis.  No change in medications at the present time.  Flu shot was given.  Physical exam in 4 months. Blood pressure was on the low side. He is totally asymptomatic with this. He will increase his fluids and let me know if he develops any lightheaded or dizzy episodes. I personally performed the services described in this documentation, which was scribed in my presence. The recorded information has been reviewed and is accurate.

## 2014-08-22 ENCOUNTER — Other Ambulatory Visit: Payer: Self-pay | Admitting: Physician Assistant

## 2014-08-22 NOTE — Telephone Encounter (Signed)
Dr Everlene Farrier, I was going to give pt 1 mos RF w/note that he needs to RTC for more, but see that he has an appt scheduled for 10/21/14. Do you want to give RFs on this and other chronic meds until then, or have him come in bf that?

## 2014-08-25 ENCOUNTER — Other Ambulatory Visit: Payer: Self-pay

## 2014-08-25 MED ORDER — TAMSULOSIN HCL 0.4 MG PO CAPS: ORAL_CAPSULE | ORAL | Status: DC

## 2014-08-29 ENCOUNTER — Other Ambulatory Visit: Payer: Self-pay

## 2014-08-29 DIAGNOSIS — R490 Dysphonia: Secondary | ICD-10-CM

## 2014-08-29 NOTE — Telephone Encounter (Signed)
Pharm reqs a new Rx for ProAir. Dr Everlene Farrier, you saw pt in Oct, but haven't discussed this med recently. OK to send Rx?

## 2014-08-30 MED ORDER — ALBUTEROL SULFATE HFA 108 (90 BASE) MCG/ACT IN AERS: 2.0000 | INHALATION_SPRAY | Freq: Four times a day (QID) | RESPIRATORY_TRACT | Status: DC | PRN

## 2014-09-03 ENCOUNTER — Ambulatory Visit (INDEPENDENT_AMBULATORY_CARE_PROVIDER_SITE_OTHER): Payer: 59 | Admitting: Family Medicine

## 2014-09-03 VITALS — BP 142/96 | HR 91 | Temp 97.7°F | Resp 16 | Ht 74.0 in | Wt 212.8 lb

## 2014-09-03 DIAGNOSIS — R05 Cough: Secondary | ICD-10-CM

## 2014-09-03 DIAGNOSIS — R059 Cough, unspecified: Secondary | ICD-10-CM

## 2014-09-03 MED ORDER — DOXYCYCLINE HYCLATE 100 MG PO CAPS: 100.0000 mg | ORAL_CAPSULE | Freq: Two times a day (BID) | ORAL | Status: DC

## 2014-09-03 MED ORDER — BENZONATATE 100 MG PO CAPS: 100.0000 mg | ORAL_CAPSULE | Freq: Three times a day (TID) | ORAL | Status: DC | PRN

## 2014-09-03 NOTE — Progress Notes (Signed)
[facility] [address] [place] [address] [phone]  Date:  09/03/2014   Name:  Chris Welch   DOB:  February 24, 1946   MRN:  [id number]  PCP:  Jenny Reichmann, MD    Chief Complaint: chest congestion   History of Present Illness:  Chris Welch is a 68 y.o. very pleasant male patient who presents with the following:  Here today with illness.  He has noted chest congestion- he is was not sure what he should do about this or if it is anything of concern. He has noted the congestion for about 10 days.  He has a cough but it is more dry.   He has not really felt bad- "I feel ok."  His wife asked him to come in today "otherwise I would be on the golf course."  No fever, no chills or body aches. He does not have any sinus sx.  He did have chills for one night last week- this is now resolved totally.   No GI symptoms.  So far he has treid some robitussin DM.   He is not sure but thinks he he may have wheezed some He has vocal cord paralysis due to past operation to remove a parotid tumor.  This has left him with some swallowing difficulty and he is more prone to chest infections.  He uses robinul to help manage his secretions  Patient Active Problem List   Diagnosis Date Noted  . Atypical chest pain 04/22/2014  . Hyperlipemia 04/22/2014  . Gastric reflux with aspiration 03/04/2014  . Carpal tunnel syndrome 11/26/2013  . HTN (hypertension) 11/26/2013  . AR (allergic rhinitis) 11/26/2013  . Low sodium levels 03/13/2013  . BPH associated with nocturia 08/21/2012  . Hyperlipidemia 08/21/2012  . Recurrent aspiration bronchitis/pneumonia 10/05/2011  . Diverticulosis 10/05/2011  . Hearing loss of both ears 10/05/2011  . Depression 10/05/2011  . Vocal cord paralysis 01/17/2007    Past Medical History  Diagnosis Date  . Depression   . GERD (gastroesophageal reflux disease)   . Enlarged prostate   . Tumor of soft tissue of neck 09/2006    Right  Cervical Paraganglioma  . Ear infection ear infection lasting past 2 months, still ongoing, pt on antibiotics & drops. pt states "ear infection has eaten holes through bilateral ear drums,  I am hard of hearing"  . Vocal cord paralysis     Right  . Diverticulosis   . Diverticulitis   . Malignant carotid body tumor   . Hyperlipidemia   . Hypertension   . Pneumonia     x 3  . Esophageal stricture   . Gastropathy     reactive    Past Surgical History  Procedure Laterality Date  . Tumor right side of neck  12/2006    resection of R cervical paraganglioma  . Deep neck lymph node biopsy / excision    . Patella arthroplasty Right     has had 5 surgeries right knee  . Cholecystectomy    . Lumbar laminectomy/decompression microdiscectomy Left 03/09/2013    Procedure: LUMBAR LAMINECTOMY/DECOMPRESSION MICRODISCECTOMY 1 LEVEL;  Surgeon: Eustace Moore, MD;  Location: [place] [facility];  Service: Neurosurgery;  Laterality: Left;  Left lumbar three-four extra-foraminal microdiscectomy  . Inguinal hernia repair Right     age 53    History  Substance Use Topics  . Smoking status: Never Smoker   . Smokeless tobacco: Never Used  . Alcohol Use: 0.0 oz/week  0 Not specified per week     Comment: 1 drink a day    Family History  Problem Relation Age of Onset  . Arthritis Brother     back  . Lung cancer Brother 24     (dx to death: 30 days)  . Cancer Brother     rare cancer-unknown type    Allergies  Allergen Reactions  . Hydrocodone Nausea And Vomiting  . Penicillins Other (See Comments) and Rash    Ulcers on eyes    Medication list has been reviewed and updated.  Current Outpatient Prescriptions on File Prior to Visit  Medication Sig Dispense Refill  . albuterol (PROVENTIL HFA;VENTOLIN HFA) 108 (90 BASE) MCG/ACT inhaler Inhale 2 puffs into the lungs every 6 (six) hours as needed. 1 Inhaler 4  . aspirin EC 81 MG tablet Take 81 mg by mouth.    [name] Kitchen augmented betamethasone dipropionate  (DIPROLENE AF) 0.05 % cream Apply topically 2 (two) times daily. 30 g 0  . buPROPion (WELLBUTRIN XL) 150 MG 24 hr tablet TAKE 3 TABLETS (450 MG TOTAL) BY MOUTH DAILY. 270 tablet 4  . fluticasone (FLONASE) 50 MCG/ACT nasal spray Place 2 sprays into both nostrils daily. 48 g 4  . glycopyrrolate (ROBINUL) 1 MG tablet TAKE 1 TABLET BY MOUTH 3 TIMES A DAY 90 tablet 4  . hydrochlorothiazide (MICROZIDE) 12.5 MG capsule Take 1 capsule (12.5 mg total) by mouth daily. 90 capsule 3  . losartan (COZAAR) 50 MG tablet Take 1 tablet (50 mg total) by mouth daily. 90 tablet 4  . ofloxacin (FLOXIN) 0.3 % otic solution Place 5 drops into both ears daily. 10 mL 0  . omeprazole (PRILOSEC) 40 MG capsule Take 1 capsule (40 mg total) by mouth 2 (two) times daily. 60 capsule 3  . pravastatin (PRAVACHOL) 20 MG tablet Take 1 tablet (20 mg total) by mouth daily. 90 tablet 3  . tamsulosin (FLOMAX) 0.4 MG CAPS capsule TAKE 2 CAPSULE BY MOUTH ONCE DAILY 60 capsule 1  . traMADol (ULTRAM) 50 MG tablet Take 1 tablet (50 mg total) by mouth every 8 (eight) hours as needed for pain. 30 tablet 0  . traZODone (DESYREL) 50 MG tablet TAKE 1 TABLET BY MOUTH AT BEDTIME 30 tablet 1  . VIAGRA 100 MG tablet TAKE 1/2-1 TABLET BY MOUTH DAILY AS NEEDED FOR ERECTILE DYSFUNCTION 5 tablet 11   No current facility-administered medications on file prior to visit.    Review of Systems:  As per HPI- otherwise negative.   Physical Examination: Filed Vitals:   09/03/14 0815  BP: 142/96  Pulse: 91  Temp: 97.7 F (36.5 C)  Resp: 16   Filed Vitals:   09/03/14 0815  Height: 6\' 2"  (1.88 m)  Weight: 212 lb 12.8 oz (96.525 kg)   Body mass index is 27.31 kg/(m^2). Ideal Body Weight: Weight in (lb) to have BMI = 25: 194.3  GEN: WDWN, NAD, Non-toxic, A & O x 3, looks well HEENT: Atraumatic, Normocephalic. Neck supple. No masses, No LAD.  Bilateral TM wnl, oropharynx normal.  PEERL,EOMI.   abnormal movement of posterior oropharynx due to  paralysis Ears and Nose: No external deformity. CV: RRR, No M/G/R. No JVD. No thrill. No extra heart sounds. PULM: CTA B, no wheezes, crackles, rhonchi. No retractions. No resp. distress. No accessory muscle use.[name] Kitchen EXTR: No c/c/e NEURO Normal gait.  PSYCH: Normally interactive. Conversant. Not depressed or anxious appearing.  Calm demeanor.    Assessment and Plan: Cough - Plan:  doxycycline (VIBRAMYCIN) 100 MG capsule, benzonatate (TESSALON) 100 MG capsule  At this time I do not see any sign of a bacterial infection but he is more prone to problems because of his vocal cord paralysis.  Gave him an rx for doxycycline and also for tessalon to hold- he will start these if not feeling better soon, and will call me if any other concerns   Signed Lamar Blinks, MD

## 2014-09-03 NOTE — Patient Instructions (Signed)
It does not appear that you have an infection at this time.  However, I know you are more prone to respiratory infections due to your swallowing issues Hold onto the doxycycline rx- if you are not improving in the next few days fill and use this medication.  You can also use the tesslon perles as needed for cough

## 2014-10-21 ENCOUNTER — Encounter: Payer: Self-pay | Admitting: Emergency Medicine

## 2014-10-21 ENCOUNTER — Ambulatory Visit (INDEPENDENT_AMBULATORY_CARE_PROVIDER_SITE_OTHER): Payer: 59 | Admitting: Emergency Medicine

## 2014-10-21 VITALS — BP 144/88 | HR 82 | Temp 98.6°F | Resp 16 | Ht 73.0 in | Wt 218.0 lb

## 2014-10-21 DIAGNOSIS — L989 Disorder of the skin and subcutaneous tissue, unspecified: Secondary | ICD-10-CM

## 2014-10-21 DIAGNOSIS — Z1211 Encounter for screening for malignant neoplasm of colon: Secondary | ICD-10-CM

## 2014-10-21 DIAGNOSIS — Z23 Encounter for immunization: Secondary | ICD-10-CM

## 2014-10-21 DIAGNOSIS — Z0000 Encounter for general adult medical examination without abnormal findings: Secondary | ICD-10-CM

## 2014-10-21 DIAGNOSIS — Z125 Encounter for screening for malignant neoplasm of prostate: Secondary | ICD-10-CM

## 2014-10-21 DIAGNOSIS — E785 Hyperlipidemia, unspecified: Secondary | ICD-10-CM

## 2014-10-21 DIAGNOSIS — I10 Essential (primary) hypertension: Secondary | ICD-10-CM

## 2014-10-21 LAB — COMPREHENSIVE METABOLIC PANEL
ALT: 19 U/L (ref 0–53)
AST: 18 U/L (ref 0–37)
Albumin: 3.9 g/dL (ref 3.5–5.2)
Alkaline Phosphatase: 104 U/L (ref 39–117)
BUN: 15 mg/dL (ref 6–23)
CO2: 28 mEq/L (ref 19–32)
Calcium: 9.1 mg/dL (ref 8.4–10.5)
Chloride: 102 mEq/L (ref 96–112)
Creat: 1.06 mg/dL (ref 0.50–1.35)
Glucose, Bld: 83 mg/dL (ref 70–99)
Potassium: 4.3 mEq/L (ref 3.5–5.3)
Sodium: 137 mEq/L (ref 135–145)
Total Bilirubin: 1.1 mg/dL (ref 0.2–1.2)
Total Protein: 7.3 g/dL (ref 6.0–8.3)

## 2014-10-21 LAB — POCT URINALYSIS DIPSTICK
Bilirubin, UA: NEGATIVE
Blood, UA: NEGATIVE
Glucose, UA: NEGATIVE
Ketones, UA: NEGATIVE
Leukocytes, UA: NEGATIVE
Nitrite, UA: NEGATIVE
Protein, UA: NEGATIVE
Spec Grav, UA: 1.01
Urobilinogen, UA: 1
pH, UA: 7

## 2014-10-21 LAB — LIPID PANEL
Cholesterol: 179 mg/dL (ref 0–200)
HDL: 50 mg/dL (ref >39)
LDL Cholesterol: 116 mg/dL — ABNORMAL HIGH (ref 0–99)
Total CHOL/HDL Ratio: 3.6 Ratio
Triglycerides: 65 mg/dL (ref <150)
VLDL: 13 mg/dL (ref 0–40)

## 2014-10-21 LAB — CBC WITH DIFFERENTIAL/PLATELET
Basophils Absolute: 0.1 10*3/uL (ref 0.0–0.1)
Basophils Relative: 1 % (ref 0–1)
Eosinophils Absolute: 0.1 10*3/uL (ref 0.0–0.7)
Eosinophils Relative: 2 % (ref 0–5)
HCT: 38.3 % — ABNORMAL LOW (ref 39.0–52.0)
Hemoglobin: 13 g/dL (ref 13.0–17.0)
Lymphocytes Relative: 15 % (ref 12–46)
Lymphs Abs: 0.9 10*3/uL (ref 0.7–4.0)
MCH: 28.8 pg (ref 26.0–34.0)
MCHC: 33.9 g/dL (ref 30.0–36.0)
MCV: 84.7 fL (ref 78.0–100.0)
MPV: 10.9 fL (ref 8.6–12.4)
Monocytes Absolute: 0.5 10*3/uL (ref 0.1–1.0)
Monocytes Relative: 8 % (ref 3–12)
Neutro Abs: 4.6 10*3/uL (ref 1.7–7.7)
Neutrophils Relative %: 74 % (ref 43–77)
Platelets: 218 10*3/uL (ref 150–400)
RBC: 4.52 MIL/uL (ref 4.22–5.81)
RDW: 13.5 % (ref 11.5–15.5)
WBC: 6.2 10*3/uL (ref 4.0–10.5)

## 2014-10-21 LAB — TSH: TSH: 2.125 u[IU]/mL (ref 0.350–4.500)

## 2014-10-21 LAB — IFOBT (OCCULT BLOOD): IFOBT: NEGATIVE

## 2014-10-21 NOTE — Progress Notes (Addendum)
   Subjective:    Patient ID: Chris Welch, male    DOB: [birth year], 69 y.o.   MRN: [id number] This chart was scribed for Arlyss Queen, MD by Zola Button, Medical Scribe. This patient was seen in room 22 and the patient's care was started at 8:41 AM.   HPI HPI Comments: Chris Welch is a 69 y.o. male with a hx of vocal cord paralysis who presents to the [facility] for a complete physical exam. Patient reports having some intermittent, waxing and waning heel pain. Last night, he could hardly walk on his feet, but he states he does not have pain today. He does not wear heel cups. He states he is on his feet often. Patient has tried using in-soles, but without relief. He says his hearing aid is just "okay." He states he normally gets drainage in his ear.    Review of Systems  HENT: Positive for ear discharge.        Objective:   Physical Exam CONSTITUTIONAL: Well developed/well nourished HEAD: Normocephalic/atraumatic EYES: EOM/PERRL ENMT: Mucous membranes moist NECK: supple no meningeal signs SPINE: entire spine nontender CV: S1/S2 noted, no murmurs/rubs/gallops noted LUNGS: Lungs are clear to auscultation bilaterally, no apparent distress ABDOMEN: soft, nontender, no rebound or guarding GU: Scar from a repair of right inguinal hernia. Small left inguinal hernia. NEURO: Dysarthria. Atrophy on the right side of tongue. Trauma to the nerves in his throat. EXTREMITIES: pulses normal, full ROM there is no tenderness at the present time over the left heel. SKIN: warm, color normal PSYCH: no abnormalities of mood noted EKG previous septal infarct otherwise unremarkable.        Assessment & Plan:  Patient looks good. He has bilateral hearing aids. He has chronic drainage from his left ear. He does have a couple of actinic keratosis on his left temple and left forearm and will refer him to dermatology for this. He has a small left inguinal hernia which is  asymptomatic. Routine labs were done. 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 He will be given Prevnar today .ersonally performed the services described in this documentation, which was scribed in my presence. The recorded information has been reviewed and is accurate.

## 2014-10-21 NOTE — Progress Notes (Deleted)
   Subjective:    Patient ID: Chris Welch, male    DOB: 1946/02/11, 69 y.o.   MRN: [id number]  HPI    Review of Systems  Constitutional: Positive for fatigue.  HENT: Positive for ear discharge, ear pain and hearing loss.   Eyes: Negative.   Respiratory: Negative.   Cardiovascular: Negative.   Gastrointestinal: Negative.   Endocrine: Negative.   Genitourinary: Negative.   Musculoskeletal: Positive for myalgias, arthralgias and neck stiffness.  Skin: Negative.   Allergic/Immunologic: Negative.  Negative for food allergies.  Neurological: Positive for headaches.  Hematological: Negative.   Psychiatric/Behavioral: Negative.        Objective:   Physical Exam        Assessment & Plan:

## 2014-10-22 LAB — PSA, MEDICARE: PSA: 2.83 ng/mL (ref <=4.00)

## 2014-10-29 ENCOUNTER — Other Ambulatory Visit: Payer: Self-pay | Admitting: Internal Medicine

## 2014-10-29 ENCOUNTER — Other Ambulatory Visit: Payer: Self-pay | Admitting: Emergency Medicine

## 2014-11-11 DIAGNOSIS — H903 Sensorineural hearing loss, bilateral: Secondary | ICD-10-CM | POA: Diagnosis not present

## 2014-11-11 DIAGNOSIS — H6093 Unspecified otitis externa, bilateral: Secondary | ICD-10-CM | POA: Diagnosis not present

## 2014-12-31 ENCOUNTER — Other Ambulatory Visit: Payer: Self-pay | Admitting: *Deleted

## 2014-12-31 MED ORDER — OMEPRAZOLE 40 MG PO CPDR: DELAYED_RELEASE_CAPSULE | ORAL | Status: DC

## 2015-01-22 ENCOUNTER — Other Ambulatory Visit: Payer: Self-pay | Admitting: Physician Assistant

## 2015-02-11 ENCOUNTER — Other Ambulatory Visit: Payer: Self-pay | Admitting: Physician Assistant

## 2015-02-17 ENCOUNTER — Other Ambulatory Visit: Payer: Self-pay | Admitting: Physician Assistant

## 2015-04-06 ENCOUNTER — Other Ambulatory Visit: Payer: Self-pay | Admitting: Physician Assistant

## 2015-04-08 NOTE — Telephone Encounter (Signed)
Dr Everlene Farrier, you saw pt in Feb and refilled most of his meds for a year, but don't see this one discussed. Do you want to RF it also for the remaining of year until pt's next appt sch for 10/2015, or do you need to see pt back before that for f/up?

## 2015-05-15 ENCOUNTER — Other Ambulatory Visit: Payer: Self-pay | Admitting: Emergency Medicine

## 2015-06-01 ENCOUNTER — Other Ambulatory Visit: Payer: Self-pay

## 2015-06-01 ENCOUNTER — Telehealth: Payer: Self-pay

## 2015-06-01 MED ORDER — LOSARTAN POTASSIUM 50 MG PO TABS: ORAL_TABLET | ORAL | Status: DC

## 2015-06-01 NOTE — Telephone Encounter (Signed)
Patient called for a refill of his losartan 50 mg. 30 day supply sent to [facility].

## 2015-06-01 NOTE — Telephone Encounter (Signed)
Pt needs a refill on his bp medicine.  The pharmacy has requested this a couple of times and have even gave him extra pills because of this.  Please call asap [phone]

## 2015-06-01 NOTE — Telephone Encounter (Signed)
Patient was given a 30 day supply of his losartan.

## 2015-06-03 ENCOUNTER — Telehealth: Payer: Self-pay | Admitting: Cardiology

## 2015-06-03 NOTE — Telephone Encounter (Signed)
New message   Pt c/o BP issue: STAT if pt c/o blurred vision, one-sided weakness or slurred speech  1. What are your last 5 BP readings? 75/57 yesterday @ 3pm; 117/70 this morning @6 :30  2. Are you having any other symptoms (ex. Dizziness, headache, blurred vision, passed out)? Pt felt like he was going to pass out,blurry vision and dizziness yesterday; Pt states he doesn't have any of these symptoms right now. Pt feels tired  3. What is your BP issue? B/p running lower than normal

## 2015-06-03 NOTE — Telephone Encounter (Signed)
Pt states yesterday while playing golf he became very fatigued and dizzy.  He had not been walking the course and had drank 2 bottles of water and a bottle of Gatorade.  He does not feel as though he was dehydrated.  When he got home and checked his BP it was 75/57.  He does not know what his heart rate was.  He went to bed for several hours and is feeling better today.  Reviewed with Dr Marlou Porch and pt is to hold Losartan and HCTZ.  He will follow up as scheduled next week.  He will call back before then if further concerns.

## 2015-06-04 ENCOUNTER — Ambulatory Visit (INDEPENDENT_AMBULATORY_CARE_PROVIDER_SITE_OTHER): Payer: 59 | Admitting: Emergency Medicine

## 2015-06-04 VITALS — BP 126/74 | HR 97 | Temp 98.6°F | Resp 18 | Ht 73.5 in | Wt 212.0 lb

## 2015-06-04 DIAGNOSIS — N400 Enlarged prostate without lower urinary tract symptoms: Secondary | ICD-10-CM | POA: Diagnosis not present

## 2015-06-04 DIAGNOSIS — R5383 Other fatigue: Secondary | ICD-10-CM

## 2015-06-04 DIAGNOSIS — N3000 Acute cystitis without hematuria: Secondary | ICD-10-CM | POA: Diagnosis not present

## 2015-06-04 DIAGNOSIS — R351 Nocturia: Secondary | ICD-10-CM

## 2015-06-04 LAB — POCT CBC
Granulocyte percent: 87.1 %G — AB (ref 37–80)
HCT, POC: 39.3 % — AB (ref 43.5–53.7)
Hemoglobin: 12.6 g/dL — AB (ref 14.1–18.1)
Lymph, poc: 1.1 (ref 0.6–3.4)
MCH, POC: 28.1 pg (ref 27–31.2)
MCHC: 32 g/dL (ref 31.8–35.4)
MCV: 87.9 fL (ref 80–97)
MID (cbc): 0.8 (ref 0–0.9)
MPV: 7 fL (ref 0–99.8)
POC Granulocyte: 12.5 — AB (ref 2–6.9)
POC LYMPH PERCENT: 7.5 %L — AB (ref 10–50)
POC MID %: 5.4 %M (ref 0–12)
Platelet Count, POC: 221 10*3/uL (ref 142–424)
RBC: 4.47 M/uL — AB (ref 4.69–6.13)
RDW, POC: 13.9 %
WBC: 14.4 10*3/uL — AB (ref 4.6–10.2)

## 2015-06-04 LAB — POCT URINALYSIS DIPSTICK
Bilirubin, UA: NEGATIVE
Glucose, UA: NEGATIVE
Ketones, UA: 15
Nitrite, UA: NEGATIVE
Spec Grav, UA: 1.015
Urobilinogen, UA: 4
pH, UA: 7

## 2015-06-04 LAB — POCT UA - MICROSCOPIC ONLY
Casts, Ur, LPF, POC: NEGATIVE
Crystals, Ur, HPF, POC: NEGATIVE
Mucus, UA: NEGATIVE
Yeast, UA: NEGATIVE

## 2015-06-04 MED ORDER — CIPROFLOXACIN HCL 500 MG PO TABS: 500.0000 mg | ORAL_TABLET | Freq: Two times a day (BID) | ORAL | Status: DC

## 2015-06-04 NOTE — Patient Instructions (Signed)

## 2015-06-04 NOTE — Progress Notes (Signed)
Subjective:  Patient ID: Chris Welch, male    DOB: October 03, 1945  Age: 69 y.o. MRN: [id number]  CC: Urinary Retention   HPI Chris Welch presents presents with dysuria urgency and frequency over the last 3 days. He has foul-smelling urine. Has no fever chills no nausea or vomiting. Says he has frequent nighttime urination despite being on Flomax. Has no abdominal pain or back pain.  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 pt states "ear infection has eaten holes through bilateral ear drums,  I am hard of hearing"); Vocal cord paralysis; Diverticulosis; Diverticulitis; Malignant carotid body tumor; Hyperlipidemia; Hypertension; Pneumonia; Esophageal stricture; and Gastropathy.   He has past surgical history that includes tumor right side of neck (12/2006); Deep neck lymph node biopsy / excision; Patella arthroplasty (Right); Cholecystectomy; Lumbar laminectomy/decompression microdiscectomy (Left, 03/09/2013); and Inguinal hernia repair (Right).   His  family history includes Arthritis in his brother; Cancer in his brother; Lung cancer (age of onset: 96) in his brother.  He   reports that he has never smoked. He has never used smokeless tobacco. He reports that he drinks alcohol. He reports that he does not use illicit drugs.  Outpatient Prescriptions Prior to Visit  Medication Sig Dispense Refill  . albuterol (PROVENTIL HFA;VENTOLIN HFA) 108 (90 BASE) MCG/ACT inhaler Inhale 2 puffs into the lungs every 6 (six) hours as needed. 1 Inhaler 4  . aspirin EC 81 MG tablet Take 81 mg by mouth.    [name] Kitchen augmented betamethasone dipropionate (DIPROLENE AF) 0.05 % cream Apply topically 2 (two) times daily. 30 g 0  . buPROPion (WELLBUTRIN XL) 150 MG 24 hr tablet TAKE 3 TABLETS BY  MOUTH DAILY. 270 tablet 2  . doxycycline (VIBRAMYCIN) 100 MG capsule Take 1 capsule (100 mg total) by mouth 2 (two) times daily. 20 capsule 0  . fluticasone (FLONASE) 50 MCG/ACT nasal spray Place 2 sprays into both nostrils daily. 48 g 4  . glycopyrrolate (ROBINUL) 1 MG tablet TAKE 1 TABLET BY MOUTH 3 TIMES A DAY 90 tablet 2  . hydrochlorothiazide (MICROZIDE) 12.5 MG capsule Take 1 capsule (12.5 mg total) by mouth daily. 90 capsule 3  . ofloxacin (FLOXIN) 0.3 % otic solution Place 5 drops into both ears daily. 10 mL 0  . omeprazole (PRILOSEC) 40 MG capsule TAKE 1 CAPSULE BY MOUTH 2 TIMES DAILY. 60 capsule 3  . pravastatin (PRAVACHOL) 20 MG tablet TAKE 1 TABLET BY MOUTH DAILY. 90 tablet 0  . tamsulosin (FLOMAX) 0.4 MG CAPS capsule TAKE 2 CAPSULE BY MOUTH ONCE DAILY. 60 capsule 1  . traMADol (ULTRAM) 50 MG tablet Take 1 tablet (50 mg total) by mouth every 8 (eight) hours as needed for pain. 30 tablet 0  . traZODone (DESYREL) 50 MG tablet TAKE 1 TABLET BY MOUTH AT BEDTIME 30 tablet 1  . VIAGRA 100 MG tablet TAKE 1/2-1 TABLET BY MOUTH DAILY AS NEEDED FOR ERECTILE DYSFUNCTION 5 tablet 11  . benzonatate (TESSALON) 100 MG capsule Take 1 capsule (100 mg total) by mouth 3 (three) times daily as needed for cough. (Patient not taking: Reported on 10/21/2014) 40 capsule 0  . losartan (COZAAR) 50 MG tablet TAKE 1 TABLET (50 MG TOTAL) BY MOUTH DAILY. (Patient not taking: Reported on 06/04/2015) 30 tablet 0   No facility-administered medications prior to visit.  Social History   Social History  . Marital Status: Married    Spouse Name: Chris Welch  . Number of Children: 4  . Years of Education: 16   Occupational History  . Machinist     [employer]   Social History Main Topics  . Smoking status: Never Smoker   . Smokeless tobacco: Never Used  . Alcohol Use: 0.0 oz/week    0 Standard drinks or equivalent per week     Comment: 1 drink a day  . Drug Use: No  . Sexual Activity:    Partners:  Female   Other Topics Concern  . None   Social History Narrative   Lives with his wife.  Three children are grown.  Younger daughter will graduate from [employer] in 01/2013.  Both sons and oldest daughter are in [employer]. Exercises regularly.     Review of Systems  Constitutional: Negative for fever, chills and appetite change.  HENT: Negative for congestion, ear pain, postnasal drip, sinus pressure and sore throat.   Eyes: Negative for pain and redness.  Respiratory: Negative for cough, shortness of breath and wheezing.   Cardiovascular: Negative for leg swelling.  Gastrointestinal: Negative for nausea, vomiting, abdominal pain, diarrhea, constipation and blood in stool.  Endocrine: Negative for polyuria.  Genitourinary: Positive for dysuria, urgency and frequency. Negative for flank pain.  Musculoskeletal: Negative for gait problem.  Skin: Negative for rash.  Neurological: Negative for weakness and headaches.  Psychiatric/Behavioral: Negative for confusion and decreased concentration. The patient is not nervous/anxious.     Objective:  BP 126/74 mmHg  Pulse 97  Temp(Src) 98.6 F (37 C) (Oral)  Resp 18  Ht 6' 1.5" (1.867 m)  Wt 212 lb (96.163 kg)  BMI 27.59 kg/m2  SpO2 97%  Physical Exam  Constitutional: He is oriented to person, place, and time. He appears well-developed and well-nourished. No distress.  HENT:  Head: Normocephalic and atraumatic.  Right Ear: External ear normal.  Left Ear: External ear normal.  Nose: Nose normal.  Eyes: Conjunctivae and EOM are normal. Pupils are equal, round, and reactive to light. No scleral icterus.  Neck: Normal range of motion. Neck supple. No tracheal deviation present.  Cardiovascular: Normal rate, regular rhythm and normal heart sounds.   Pulmonary/Chest: Effort normal. No respiratory distress. He has no wheezes. He has no rales.  Abdominal: He exhibits no mass. There is no tenderness. There is no rebound and no guarding.    Musculoskeletal: He exhibits no edema.  Lymphadenopathy:    He has no cervical adenopathy.  Neurological: He is alert and oriented to person, place, and time. Coordination normal.  Skin: Skin is warm and dry. No rash noted.  Psychiatric: He has a normal mood and affect. His behavior is normal.      Assessment & Plan:   Leanard was seen today for urinary retention.  Diagnoses and all orders for this visit:  Other fatigue -     POCT CBC -     POCT urinalysis dipstick -     POCT UA - Microscopic Only -     Comprehensive metabolic panel -     EKG [id number]   I am having Mr. Reiswig maintain his augmented betamethasone dipropionate, traMADol, VIAGRA, fluticasone, aspirin EC, hydrochlorothiazide, ofloxacin, traZODone, albuterol, doxycycline, benzonatate, omeprazole, pravastatin, glycopyrrolate, buPROPion, tamsulosin, and losartan.  No orders of the defined types were placed in this encounter.    Appropriate red flag conditions were discussed with the patient as well as  actions that should be taken.  Patient expressed his understanding.  Follow-up: No Follow-up on file.  Roselee Culver, MD   Results for orders placed or performed in visit on 06/04/15  POCT CBC  Result Value Ref Range   WBC 14.4 (A) 4.6 - 10.2 K/uL   Lymph, poc 1.1 0.6 - 3.4   POC LYMPH PERCENT 7.5 (A) 10 - 50 %L   MID (cbc) 0.8 0 - 0.9   POC MID % 5.4 0 - 12 %M   POC Granulocyte 12.5 (A) 2 - 6.9   Granulocyte percent 87.1 (A) 37 - 80 %G   RBC 4.47 (A) 4.69 - 6.13 M/uL   Hemoglobin 12.6 (A) 14.1 - 18.1 g/dL   HCT, POC 39.3 (A) 43.5 - 53.7 %   MCV 87.9 80 - 97 fL   MCH, POC 28.1 27 - 31.2 pg   MCHC 32.0 31.8 - 35.4 g/dL   RDW, POC 13.9 %   Platelet Count, POC 221 142 - 424 K/uL   MPV 7.0 0 - 99.8 fL  POCT urinalysis dipstick  Result Value Ref Range   Color, UA yellow    Clarity, UA cloudy    Glucose, UA neg    Bilirubin, UA neg    Ketones, UA 15    Spec Grav, UA 1.015    Blood, UA small     pH, UA 7.0    Protein, UA trace    Urobilinogen, UA 4.0    Nitrite, UA neg    Leukocytes, UA small (1+) (A) Negative  POCT UA - Microscopic Only  Result Value Ref Range   WBC, Ur, HPF, POC TNTC    RBC, urine, microscopic 0-1    Bacteria, U Microscopic many    Mucus, UA neg    Epithelial cells, urine per micros 0-1    Crystals, Ur, HPF, POC neg    Casts, Ur, LPF, POC neg    Yeast, UA neg

## 2015-06-05 LAB — COMPREHENSIVE METABOLIC PANEL
ALT: 18 U/L (ref 9–46)
AST: 19 U/L (ref 10–35)
Albumin: 4.3 g/dL (ref 3.6–5.1)
Alkaline Phosphatase: 110 U/L (ref 40–115)
BUN: 17 mg/dL (ref 7–25)
CO2: 26 mmol/L (ref 20–31)
Calcium: 9.1 mg/dL (ref 8.6–10.3)
Chloride: 97 mmol/L — ABNORMAL LOW (ref 98–110)
Creat: 1.16 mg/dL (ref 0.70–1.25)
Glucose, Bld: 92 mg/dL (ref 65–99)
Potassium: 4.4 mmol/L (ref 3.5–5.3)
Sodium: 133 mmol/L — ABNORMAL LOW (ref 135–146)
Total Bilirubin: 2.5 mg/dL — ABNORMAL HIGH (ref 0.2–1.2)
Total Protein: 7.4 g/dL (ref 6.1–8.1)

## 2015-06-09 ENCOUNTER — Encounter: Payer: Self-pay | Admitting: Cardiology

## 2015-06-09 ENCOUNTER — Ambulatory Visit (INDEPENDENT_AMBULATORY_CARE_PROVIDER_SITE_OTHER): Payer: 59 | Admitting: Cardiology

## 2015-06-09 VITALS — BP 114/74 | HR 120 | Ht 75.0 in | Wt 210.0 lb

## 2015-06-09 DIAGNOSIS — R0789 Other chest pain: Secondary | ICD-10-CM | POA: Diagnosis not present

## 2015-06-09 DIAGNOSIS — I10 Essential (primary) hypertension: Secondary | ICD-10-CM | POA: Diagnosis not present

## 2015-06-09 DIAGNOSIS — E785 Hyperlipidemia, unspecified: Secondary | ICD-10-CM

## 2015-06-09 DIAGNOSIS — I9589 Other hypotension: Secondary | ICD-10-CM | POA: Diagnosis not present

## 2015-06-09 MED ORDER — PRAVASTATIN SODIUM 20 MG PO TABS: 20.0000 mg | ORAL_TABLET | Freq: Every day | ORAL | Status: AC

## 2015-06-09 NOTE — Patient Instructions (Signed)
Medication Instructions:  Please restart Pravastatin. Continue all other medications as listed.  Follow-Up: Follow up in 1 year with Dr. Marlou Porch.  You will receive a letter in the mail 2 months before you are due.  Please call us when you receive this letter to schedule your follow up appointment.  Thank you for choosing [facility]!!

## 2015-06-09 NOTE — Progress Notes (Signed)
[facility] [address], [address] [place], [place]  [address] Phone: [phone] Fax:  [phone]  Date:  06/09/2015   ID:  DOVID BARTKO, DOB 01-23-1946, MRN [id number]  PCP:  Jenny Reichmann, MD   History of Present Illness: Chris Welch is a 69 y.o. male seen originally in December of 2012 for atypical chest pain.  [name] Kitchen Episode of hypotension on the [place]. His blood pressure was as low as 75 systolic. He ended up stopping his HCTZ 12.5 mg and his losartan 50 mg. He drink extra Gatorade. The next day he went to his family doctor and was found to have a urinary tract infection. He is still completing the antibody. He feels better. When his blood pressure was this low, he felt as though he was about to faint. No chest pain. No shortness of breath. He did not have any change in urinary flow. No dysuria.      on my original visit with him,  had chest pain, central with stabbing in his back. Duration usually 1-69min. On vacation when he felt. Had eaten breakfast, going up in elevator and this pain came up suddenly. Hurt all the way to room. Finally stopped. 5 minutes later happened again. Had light episodes at the park. No diaphoresis. No SOB.   No early CAD family history.   HTN better control. Takes at home, 165/110 at one point. Better now. +Hyperlipidemia, non smoker.   He went under stress testing in [place] 6/15 which was low risk (same in August of 2012). No evidence of ischemia.   He states that he does have occasional GERD especially after eating hot and spicy foods. TUMS seem to help. This is nonexertional discomfort.. Dr. Olevia Perches.     Wt Readings from Last 3 Encounters:  06/09/15 210 lb (95.255 kg)  06/04/15 212 lb (96.163 kg)  10/21/14 218 lb (98.884 kg)     Past Medical History  Diagnosis Date  . Depression   . GERD (gastroesophageal reflux disease)   . Enlarged prostate   . 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  Ear infection ear infection lasting past 2 months, still ongoing, pt on antibiotics & drops. pt states "ear infection has eaten holes through bilateral ear drums,  I am hard of hearing"  . Vocal cord paralysis     Right  . Diverticulosis   . Diverticulitis   . Malignant carotid body tumor   . Hyperlipidemia   . Hypertension   . Pneumonia     x 3  . Esophageal stricture   . Gastropathy     reactive    Past Surgical History  Procedure Laterality Date  . Tumor right side of neck  12/2006    resection of R cervical paraganglioma  . Deep neck lymph node biopsy / excision    . Patella arthroplasty Right     has had 5 surgeries right knee  . Cholecystectomy    . Lumbar laminectomy/decompression microdiscectomy Left 03/09/2013    Procedure: LUMBAR LAMINECTOMY/DECOMPRESSION MICRODISCECTOMY 1 LEVEL;  Surgeon: Eustace Moore, MD;  Location: [facility];  Service: Neurosurgery;  Laterality: Left;  Left lumbar three-four extra-foraminal microdiscectomy  . Inguinal hernia repair Right     age 89    Current Outpatient Prescriptions  Medication Sig Dispense Refill  . albuterol (PROVENTIL HFA;VENTOLIN HFA) 108 (90 BASE) MCG/ACT inhaler Inhale 2 puffs into the lungs every 6 (  six) hours as needed. 1 Inhaler 4  . aspirin EC 81 MG tablet Take 81 mg by mouth.    [name] Kitchen augmented betamethasone dipropionate (DIPROLENE AF) 0.05 % cream Apply topically 2 (two) times daily. 30 g 0  . benzonatate (TESSALON) 100 MG capsule Take 1 capsule (100 mg total) by mouth 3 (three) times daily as needed for cough. 40 capsule 0  . buPROPion (WELLBUTRIN XL) 150 MG 24 hr tablet TAKE 3 TABLETS BY MOUTH DAILY. 270 tablet 2  . ciprofloxacin (CIPRO) 500 MG tablet Take 1 tablet (500 mg total) by mouth 2 (two) times daily. 20 tablet 0  . doxycycline (VIBRAMYCIN) 100 MG capsule Take 1 capsule (100 mg total) by mouth 2 (two) times daily. 20 capsule 0  . fluticasone (FLONASE) 50 MCG/ACT nasal spray Place 2 sprays into both nostrils  daily. 48 g 4  . glycopyrrolate (ROBINUL) 1 MG tablet TAKE 1 TABLET BY MOUTH 3 TIMES A DAY 90 tablet 2  . losartan (COZAAR) 50 MG tablet TAKE 1 TABLET (50 MG TOTAL) BY MOUTH DAILY. 30 tablet 0  . ofloxacin (FLOXIN) 0.3 % otic solution Place 5 drops into both ears daily. 10 mL 0  . omeprazole (PRILOSEC) 40 MG capsule TAKE 1 CAPSULE BY MOUTH 2 TIMES DAILY. 60 capsule 3  . tamsulosin (FLOMAX) 0.4 MG CAPS capsule TAKE 2 CAPSULE BY MOUTH ONCE DAILY. 60 capsule 1  . VIAGRA 100 MG tablet TAKE 1/2-1 TABLET BY MOUTH DAILY AS NEEDED FOR ERECTILE DYSFUNCTION 5 tablet 11  . hydrochlorothiazide (MICROZIDE) 12.5 MG capsule Take 1 capsule (12.5 mg total) by mouth daily. (Patient not taking: Reported on 06/09/2015) 90 capsule 3  . pravastatin (PRAVACHOL) 20 MG tablet TAKE 1 TABLET BY MOUTH DAILY. (Patient not taking: Reported on 06/09/2015) 90 tablet 0  . traMADol (ULTRAM) 50 MG tablet Take 1 tablet (50 mg total) by mouth every 8 (eight) hours as needed for pain. (Patient not taking: Reported on 06/09/2015) 30 tablet 0  . traZODone (DESYREL) 50 MG tablet TAKE 1 TABLET BY MOUTH AT BEDTIME (Patient not taking: Reported on 06/09/2015) 30 tablet 1   No current facility-administered medications for this visit.    Allergies:    Allergies  Allergen Reactions  . Hydrocodone Nausea And Vomiting  . Penicillins Other (See Comments) and Rash    Ulcers on eyes    Social History:  The patient  reports that he has never smoked. He has never used smokeless tobacco. He reports that he drinks alcohol. He reports that he does not use illicit drugs.   Family History  Problem Relation Age of Onset  . Arthritis Brother     back  . Lung cancer Brother 2     (dx to death: 30 days)  . Cancer Brother     rare cancer-unknown type    ROS:  Please see the history of present illness.   Denies any syncope, bleeding, orthopnea, PND, strokelike symptoms. Positive for GERD. Atypical chest pain.   All other systems reviewed and  negative.   PHYSICAL EXAM: VS:  BP 114/74 mmHg  Pulse 120  Ht 6\' 3"  (1.905 m)  Wt 210 lb (95.255 kg)  BMI 26.25 kg/m2  SpO2 96% Well nourished, well developed, in no acute distress HEENT: normal, [place]/AT, EOMI Neck: no JVD, normal carotid upstroke, no bruit Cardiac:  normal S1, S2; RRR; no murmur Lungs:  clear to auscultation bilaterally, no wheezing, rhonchi or rales Abd: soft, nontender, no hepatomegaly, no bruits Ext: Trace ankle  bilateral edema, 2+ distal pulses Skin: warm and dry GU: deferred Neuro: no focal abnormalities noted, AAO x 3  EKG:  04/22/14-normal sinus rhythm, 79, septal Q waves    Nuclear stress test 6/15 (done in [place])--low risk, no ischemia  ASSESSMENT AND PLAN:  1.  hypotension-sounded as though he was about to become uroseptic. Now on appropriate biotics. Fluid hydration. Continue to hold both the losartan and HCTZ. His blood pressures are normal. If they begin to rise, we will consider re-addition of losartan. He has been seen and treated by his family physician.  2. Atypical chest pain-pain was sharp in character, somewhat fleeting, nonexertional, at times associated with eating.  he is no longer having any discomfort. Nuclear stress test was low risk, no ischemia. Prior nuclear stress test in 2012 was the same. He has had one episode since then while on the golf course, less severe. If symptoms become more typical or more worrisome, we will likely proceed with cardiac catheterization. I discussed with him and his wife. Continuing to aggressively modify risk factors. 3. Hypertension, essential- holding HCTZ and losartan as above.  4. Hyperlipidemia-his LDL off of medication was 176. On medication was 96. Continue with pravastatin. I would like to recheck this if not done by Dr. Everlene Farrier. 5. 12 month follow up.  Signed, Candee Furbish, MD [facility]  06/09/2015 2:24 PM

## 2015-06-18 ENCOUNTER — Other Ambulatory Visit: Payer: Self-pay | Admitting: Emergency Medicine

## 2015-06-23 ENCOUNTER — Encounter: Payer: Self-pay | Admitting: Emergency Medicine

## 2015-06-30 ENCOUNTER — Other Ambulatory Visit: Payer: Self-pay | Admitting: Emergency Medicine

## 2015-07-23 ENCOUNTER — Telehealth: Payer: Self-pay | Admitting: Emergency Medicine

## 2015-07-23 ENCOUNTER — Ambulatory Visit (INDEPENDENT_AMBULATORY_CARE_PROVIDER_SITE_OTHER): Payer: 59

## 2015-07-23 DIAGNOSIS — Z23 Encounter for immunization: Secondary | ICD-10-CM | POA: Diagnosis not present

## 2015-07-23 NOTE — Telephone Encounter (Signed)
Pt came in saying he needs a refill of his Avadar. Please call the pt if you have any questions or concerns.  Pt ph# [phone] Thank you.

## 2015-07-24 ENCOUNTER — Other Ambulatory Visit: Payer: Self-pay | Admitting: Physician Assistant

## 2015-07-24 NOTE — Telephone Encounter (Signed)
Left message for pt to call back.   I don't know which medication he needs.

## 2015-07-24 NOTE — Telephone Encounter (Signed)
Left message for pt to call back  °

## 2015-08-28 ENCOUNTER — Telehealth: Payer: Self-pay | Admitting: Cardiology

## 2015-08-28 ENCOUNTER — Other Ambulatory Visit: Payer: Self-pay | Admitting: *Deleted

## 2015-08-28 MED ORDER — LOSARTAN POTASSIUM 50 MG PO TABS: 50.0000 mg | ORAL_TABLET | Freq: Every day | ORAL | Status: DC

## 2015-08-28 NOTE — Telephone Encounter (Signed)
Talked to DOD, advised to restart Losartan 50mg  daily.  Called patient advised of this and told him I would send a script to [facility] outpatient for this.  Patient expressed understanding.

## 2015-08-28 NOTE — Telephone Encounter (Signed)
New message   Pt cant find his BP medicine and he wants to know what it was and if he can get more

## 2015-08-31 ENCOUNTER — Other Ambulatory Visit: Payer: Self-pay | Admitting: Emergency Medicine

## 2015-09-18 DIAGNOSIS — M1612 Unilateral primary osteoarthritis, left hip: Secondary | ICD-10-CM | POA: Diagnosis not present

## 2015-10-05 ENCOUNTER — Other Ambulatory Visit: Payer: Self-pay | Admitting: Emergency Medicine

## 2015-10-05 MED FILL — OMEPRAZOLE DR 40 MG CAPSULE: 40 | 30 days supply | Qty: 60 | Fill #3

## 2015-10-05 MED FILL — TAMSULOSIN HCL 0.4 MG CAP: 0.4 | 30 days supply | Qty: 60 | Fill #2

## 2015-10-13 ENCOUNTER — Other Ambulatory Visit: Payer: Self-pay | Admitting: Emergency Medicine

## 2015-10-13 MED FILL — MELOXICAM 15 MG TABLET: 15 | 30 days supply | Qty: 30 | Fill #1

## 2015-10-27 ENCOUNTER — Encounter: Payer: 59 | Admitting: Emergency Medicine

## 2015-11-04 DIAGNOSIS — M25552 Pain in left hip: Secondary | ICD-10-CM | POA: Diagnosis not present

## 2015-11-04 MED FILL — TAMSULOSIN HCL 0.4 MG CAP: 0.4 | 30 days supply | Qty: 60 | Fill #3

## 2015-11-04 MED FILL — traMADol HCL 50 MG TABS: 50 | 30 days supply | Qty: 60 | Fill #0

## 2015-11-12 ENCOUNTER — Encounter: Payer: 59 | Admitting: Emergency Medicine

## 2015-11-17 DIAGNOSIS — M1612 Unilateral primary osteoarthritis, left hip: Secondary | ICD-10-CM | POA: Diagnosis not present

## 2015-11-23 ENCOUNTER — Telehealth: Payer: Self-pay

## 2015-11-23 MED FILL — MELOXICAM 15 MG TABLET: 15 | 30 days supply | Qty: 30 | Fill #2

## 2015-11-23 MED FILL — PRAVASTATIN SODIUM 20 MG TA: 20 | 90 days supply | Qty: 90 | Fill #1 | Status: TO

## 2015-11-23 NOTE — Telephone Encounter (Signed)
Incoming fax request for Omeprazole 40 mg 1 po twice a day. Pt is a former Dr Olevia Perches pt. Last office 03-20-2016, Recall colon due 02-17-2018.He has a follow up for 01-05-2016 with Dr Loletha Carrow. Please advise on refills.

## 2015-11-24 MED ORDER — OMEPRAZOLE 40 MG PO CPDR: DELAYED_RELEASE_CAPSULE | ORAL | Status: DC

## 2015-11-24 MED FILL — OMEPRAZOLE DR 40 MG CAPSULE: 40 | 30 days supply | Qty: 60 | Fill #0

## 2015-11-24 NOTE — Telephone Encounter (Signed)
Please give him a month supply and one refill, to get him by until he sees me. Thanks

## 2015-11-24 NOTE — Telephone Encounter (Signed)
Rx refilled as directed by Dr Loletha Carrow.

## 2015-11-27 MED FILL — TAMSULOSIN HCL 0.4 MG CAP: 0.4 | 90 days supply | Qty: 180 | Fill #4 | Status: TO

## 2015-12-07 MED FILL — HYDROCODON-APAP 5-325: 5-325 | 3 days supply | Qty: 24 | Fill #0

## 2015-12-07 MED FILL — AZITHROMYCIN 250 MG TABLET: 250 | 5 days supply | Qty: 6 | Fill #0

## 2015-12-16 MED FILL — MELOXICAM 15 MG TABLET: 15 | 30 days supply | Qty: 30 | Fill #0 | Status: TO

## 2015-12-16 MED FILL — LOSARTAN POTASSIUM 50 MG TA: 50 | 90 days supply | Qty: 90 | Fill #1

## 2015-12-16 MED FILL — GLYCOPYRROLATE 1 MG TABLET: 1 | 30 days supply | Qty: 90 | Fill #0

## 2015-12-16 MED FILL — BUPROPION HCL XL 150 MG TAB: 150 | 90 days supply | Qty: 270 | Fill #2

## 2015-12-16 MED FILL — traMADol HCL 50 MG TABS: 50 | 30 days supply | Qty: 60 | Fill #0

## 2016-01-05 ENCOUNTER — Ambulatory Visit (INDEPENDENT_AMBULATORY_CARE_PROVIDER_SITE_OTHER): Payer: 59 | Admitting: Physician Assistant

## 2016-01-05 ENCOUNTER — Encounter: Payer: Self-pay | Admitting: Physician Assistant

## 2016-01-05 ENCOUNTER — Ambulatory Visit (INDEPENDENT_AMBULATORY_CARE_PROVIDER_SITE_OTHER): Payer: 59 | Admitting: Gastroenterology

## 2016-01-05 ENCOUNTER — Encounter: Payer: Self-pay | Admitting: Gastroenterology

## 2016-01-05 VITALS — BP 120/79 | HR 81 | Temp 98.3°F | Resp 16 | Ht 72.5 in | Wt 216.0 lb

## 2016-01-05 VITALS — BP 128/84 | HR 72 | Ht 73.0 in | Wt 218.0 lb

## 2016-01-05 DIAGNOSIS — M169 Osteoarthritis of hip, unspecified: Secondary | ICD-10-CM | POA: Insufficient documentation

## 2016-01-05 DIAGNOSIS — R351 Nocturia: Secondary | ICD-10-CM | POA: Diagnosis not present

## 2016-01-05 DIAGNOSIS — E785 Hyperlipidemia, unspecified: Secondary | ICD-10-CM | POA: Diagnosis not present

## 2016-01-05 DIAGNOSIS — I10 Essential (primary) hypertension: Secondary | ICD-10-CM

## 2016-01-05 DIAGNOSIS — F32A Depression, unspecified: Secondary | ICD-10-CM

## 2016-01-05 DIAGNOSIS — F329 Major depressive disorder, single episode, unspecified: Secondary | ICD-10-CM

## 2016-01-05 DIAGNOSIS — N401 Enlarged prostate with lower urinary tract symptoms: Secondary | ICD-10-CM

## 2016-01-05 DIAGNOSIS — Z0000 Encounter for general adult medical examination without abnormal findings: Secondary | ICD-10-CM | POA: Diagnosis not present

## 2016-01-05 DIAGNOSIS — R972 Elevated prostate specific antigen [PSA]: Secondary | ICD-10-CM

## 2016-01-05 DIAGNOSIS — M1612 Unilateral primary osteoarthritis, left hip: Secondary | ICD-10-CM

## 2016-01-05 DIAGNOSIS — K2100 Gastro-esophageal reflux disease with esophagitis, without bleeding: Secondary | ICD-10-CM

## 2016-01-05 DIAGNOSIS — Z1159 Encounter for screening for other viral diseases: Secondary | ICD-10-CM

## 2016-01-05 LAB — POCT URINALYSIS DIP (MANUAL ENTRY)
Bilirubin, UA: NEGATIVE
Blood, UA: NEGATIVE
Glucose, UA: NEGATIVE
Ketones, POC UA: NEGATIVE
Leukocytes, UA: NEGATIVE
Nitrite, UA: NEGATIVE
Protein Ur, POC: NEGATIVE
Spec Grav, UA: 1.02
Urobilinogen, UA: 0.2
pH, UA: 6

## 2016-01-05 LAB — POC MICROSCOPIC URINALYSIS (UMFC): Mucus: ABSENT

## 2016-01-05 MED ORDER — OMEPRAZOLE 40 MG PO CPDR: DELAYED_RELEASE_CAPSULE | ORAL | Status: DC

## 2016-01-05 MED FILL — OMEPRAZOLE DR 40 MG CAPSULE: 40 | 90 days supply | Qty: 180 | Fill #0 | Status: TO

## 2016-01-05 NOTE — Patient Instructions (Addendum)
   IF you received an x-ray today, you will receive an invoice from [facility]. Please contact [facility] at [phone] with questions or concerns regarding your invoice.   IF you received labwork today, you will receive an invoice from [facility]. Please contact [facility] at [phone] with questions or concerns regarding your invoice.   Our billing staff will not be able to assist you with questions regarding bills from these companies.  You will be contacted with the lab results as soon as they are available. The fastest way to get your results is to activate your My Chart account. Instructions are located on the last page of this paperwork. If you have not heard from us regarding the results in 2 weeks, please contact this office.    Keeping you healthy  Get these tests  Blood pressure- Have your blood pressure checked once a year by your healthcare provider.  Normal blood pressure is 120/80  Weight- Have your body mass index (BMI) calculated to screen for obesity.  BMI is a measure of body fat based on height and weight. You can also calculate your own BMI at www.[facility].com/bmi/.  Cholesterol- Have your cholesterol checked every year.  Diabetes- Have your blood sugar checked regularly if you have high blood pressure, high cholesterol, have a family history of diabetes or if you are overweight.  Screening for Colon Cancer- Colonoscopy starting at age 50.  Screening may begin sooner depending on your family history and other health conditions. Follow up colonoscopy as directed by your Gastroenterologist.  Screening for Prostate Cancer- Both blood work (PSA) and a rectal exam help screen for Prostate Cancer.  Screening begins at age 40 with African-American men and at age 50 with Caucasian men.  Screening may begin sooner depending on your family history.  Take these medicines  Aspirin- One aspirin daily can help prevent Heart  disease and Stroke.  Flu shot- Every fall.  Tetanus- Every 10 years.  Zostavax- Once after the age of 60 to prevent Shingles.  Pneumonia shot- Once after the age of 65; if you are younger than 65, ask your healthcare provider if you need a Pneumonia shot.  Take these steps  Don't smoke- If you do smoke, talk to your doctor about quitting.  For tips on how to quit, go to www.smokefree.gov or call 1-800-QUIT-NOW.  Be physically active- Exercise 5 days a week for at least 30 minutes.  If you are not already physically active start slow and gradually work up to 30 minutes of moderate physical activity.  Examples of moderate activity include walking briskly, mowing the yard, dancing, swimming, bicycling, etc.  Eat a healthy diet- Eat a variety of healthy food such as fruits, vegetables, low fat milk, low fat cheese, yogurt, lean meant, poultry, fish, beans, tofu, etc. For more information go to www.thenutritionsource.org  Drink alcohol in moderation- Limit alcohol intake to less than two drinks a day. Never drink and drive.  Dentist- Brush and floss twice daily; visit your dentist twice a year.  Depression- Your emotional health is as important as your physical health. If you're feeling down, or losing interest in things you would normally enjoy please talk to your healthcare provider.  Eye exam- Visit your eye doctor every year.  Safe sex- If you may be exposed to a sexually transmitted infection, use a condom.  Seat belts- Seat belts can save your life; always wear one.  Smoke/Carbon Monoxide detectors- These detectors need to be installed on   the appropriate level of your home.  Replace batteries at least once a year.  Skin cancer- When out in the sun, cover up and use sunscreen 15 SPF or higher.  Violence- If anyone is threatening you, please tell your healthcare provider.  Living Will/ Health care power of attorney- Speak with your healthcare provider and family. 

## 2016-01-05 NOTE — Patient Instructions (Addendum)
We have sent the following medications to your pharmacy for you to pick up at your convenience: Omeprazole  If you are age 70 or older, your body mass index should be between 23-30. Your Body mass index is 28.77 kg/(m^2). If this is out of the aforementioned range listed, please consider follow up with your Primary Care Provider.  If you are age 49 or younger, your body mass index should be between 19-25. Your Body mass index is 28.77 kg/(m^2). If this is out of the aformentioned range listed, please consider follow up with your Primary Care Provider.   Thank you for choosing  GI  Dr Wilfrid Lund III

## 2016-01-05 NOTE — Progress Notes (Signed)
[facility] Progress Note  Chief Complaint: GERD with esophagitis  Subjective History:   In Aug 2015 Chris Welch wrote "This is a 70 year old white male with dysphagia and aspiration. I have been able to review his old records from [facility] from [place] in 2009. 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 The vagus nerve had to be sacrificed. A barium esophagram in June 2009 showed free barium aspiration secondary to pharyngeal dysfunction. There was barium stasis. The exam was not completed due to aspiration. An upper endoscopy last month showed grade 2 reflux esophagitis, no Barrett's esophagus. He was on  Zantac 150 mg twice a day. He was dilated with 16 and 17 mm dilators and was started on Prilosec 40 mg twice a day with excellent results. All of his symptoms went away. He currently denies heartburn, dysphagia or any problems with swallowing or coughing. He is very happy with the results. He is even been able to eat foods such as hot chicken wings which he couldn't eat before. "Up-to-date with colonoscopy, normal in March 2009 according to Dr. Merlyn Albert reports that his GERD is under good control, with no heartburn or dysphagia. He has chronic hoarseness since his surgery and vocal cord paralysis. His appetite as been good and his weight stable. ROS: Cardiovascular:  no chest pain Respiratory: no dyspnea  The patient's Past Medical, Family and Social History were reviewed and are on file in the EMR.  Objective:  Med list reviewed  Vital signs in last 24 hrs: Filed Vitals:   01/05/16 0825  BP: 128/84  Pulse: 72    Physical Exam Hoarse chronic  HEENT: sclera anicteric, oral mucosa moist without lesions  Neck: supple, no thyromegaly, JVD or lymphadenopathy  Cardiac: RRR without murmurs, S1S2 heard, no peripheral edema  Pulm: clear to auscultation bilaterally, normal RR and effort noted  Abdomen: soft, No  tenderness, with active bowel sounds. No guarding or palpable hepatosplenomegaly.  Skin; warm and dry, no jaundice or rash   @ASSESSMENTPLANBEGIN @ Assessment: Encounter Diagnosis  Name Primary?  . Gastroesophageal reflux disease with esophagitis Yes    Stable symptoms on twice daily PPI. He does not want to decrease it to once daily since he has been under good control for so long.  Plan:  Medication refilled, follow-up in one year or sooner as needed.  Nelida Meuse III

## 2016-01-05 NOTE — Progress Notes (Signed)
Patient ID: Chris Welch, male    DOB: 1946/04/17, 70 y.o.   MRN: [id number]  PCP: Jenny Reichmann, MD, I last saw this patient 02/2014.  Chief Complaint  Patient presents with  . Annual Exam    Subjective:   HPI: Presents for annual wellness visit.  He's doing really well. Feels good. Happy. His wife has recently retired. 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 Traveling. He's developed bone-on-bone DJD of the LEFT hip. Dr. Mayer Camel is planning THR in the next several months.    Patient Active Problem List   Diagnosis Date Noted  . Atypical chest pain 04/22/2014  . Hyperlipemia 04/22/2014  . Gastric reflux with aspiration 03/04/2014  . Carpal tunnel syndrome 11/26/2013  . HTN (hypertension) 11/26/2013  . AR (allergic rhinitis) 11/26/2013  . Low sodium levels 03/13/2013  . BPH associated with nocturia 08/21/2012  . Hyperlipidemia 08/21/2012  . Recurrent aspiration bronchitis/pneumonia ([place]) 10/05/2011  . Diverticulosis 10/05/2011  . Hearing loss of both ears 10/05/2011  . Depression 10/05/2011  . Vocal cord paralysis 01/17/2007    Past Medical History  Diagnosis Date  . Depression   . GERD (gastroesophageal reflux disease)   . Enlarged prostate   . 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 Ear infection pt states "ear infection has eaten holes through bilateral ear drums,  I am hard of hearing"  . Vocal cord paralysis     Right  . Diverticulosis   . Diverticulitis   . 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 Hyperlipidemia   . Hypertension   . Pneumonia     x 3  . Esophageal stricture   . Gastropathy     reactive     Prior to Admission medications   Medication Sig Start Date End Date Taking? Authorizing Provider  albuterol (PROVENTIL HFA;VENTOLIN HFA) 108 (90 BASE) MCG/ACT inhaler Inhale 2 puffs into the lungs every 6 (six) hours as needed. 08/30/14  Yes Darlyne Russian, MD    aspirin EC 81 MG tablet Take 81 mg by mouth.   Yes Historical Provider, MD  buPROPion (WELLBUTRIN XL) 150 MG 24 hr tablet TAKE 3 TABLETS BY MOUTH DAILY. 04/09/15  Yes Darlyne Russian, MD  fluticasone (FLONASE) 50 MCG/ACT nasal spray Place 2 sprays into both nostrils daily. 11/26/13  Yes Yaniel Limbaugh, PA-C  glycopyrrolate (ROBINUL) 1 MG tablet TAKE 1 TABLET BY MOUTH 3 TIMES DAILY 10/14/15  Yes Darlyne Russian, MD  losartan (COZAAR) 50 MG tablet Take 1 tablet (50 mg total) by mouth daily. 08/28/15  Yes Fay Records, MD  meloxicam [facility]) 15 MG tablet  12/16/15  Yes Historical Provider, MD  ofloxacin (FLOXIN) 0.3 % otic solution Place 5 drops into both ears daily. 05/07/14  Yes Darlyne Russian, MD  omeprazole (PRILOSEC) 40 MG capsule TAKE 1 CAPSULE BY MOUTH 2 TIMES DAILY. 01/05/16  Yes Nelida Meuse III, MD  pravastatin (PRAVACHOL) 20 MG tablet Take 1 tablet (20 mg total) by mouth daily. 06/09/15  Yes Jerline Pain, MD  tamsulosin (FLOMAX) 0.4 MG CAPS capsule TAKE 2 CAPSULES BY MOUTH ONCE DAILY 07/24/15  Yes Shaune Malacara, PA-C  traMADol (ULTRAM) 50 MG tablet Take 1 tablet (50 mg total) by mouth every 8 (eight) hours as needed for pain. 03/08/13  Yes Eleanore E Elana Alm, PA-C  traZODone (DESYREL) 50 MG tablet TAKE 1 TABLET BY MOUTH  AT BEDTIME 08/22/14  Yes Darlyne Russian, MD  VIAGRA 100 MG tablet TAKE 1/2-1 TABLET BY MOUTH DAILY AS NEEDED FOR ERECTILE DYSFUNCTION 10/01/13  Yes Jaquel Glassburn Jacqulynn Cadet, PA-C    Allergies  Allergen Reactions  . Hydrocodone Nausea And Vomiting  . Penicillins Other (See Comments) and Rash    Ulcers on eyes    Past Surgical History  Procedure Laterality Date  . Tumor right side of neck  12/2006    resection of R cervical paraganglioma  . Deep neck lymph node biopsy / excision    . Patella arthroplasty Right     has had 5 surgeries right knee  . Cholecystectomy    . Lumbar laminectomy/decompression microdiscectomy Left 03/09/2013    Procedure: LUMBAR LAMINECTOMY/DECOMPRESSION  MICRODISCECTOMY 1 LEVEL;  Surgeon: Eustace Moore, MD;  Location: [facility];  Service: Neurosurgery;  Laterality: Left;  Left lumbar three-four extra-foraminal microdiscectomy  . Inguinal hernia repair Right     age 38    Family History  Problem Relation Age of Onset  . Arthritis Brother     back  . Lung cancer Brother 33     (dx to death: 30 days)  . Cancer Brother     rare cancer-unknown type  . Heart disease Mother   . Emphysema Father     Social History   Social History  . Marital Status: Married    Spouse Name: Marlowe Kays Deem  . Number of Children: 4  . Years of Education: 16   Occupational History  . Machinist-retired 2015     [employer]  . 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 Smoking status: Never Smoker   . Smokeless tobacco: Never Used  . Alcohol Use: 0.0 oz/week    0 Standard drinks or equivalent per week     Comment: 1 drink a day  . Drug Use: No  . Sexual Activity:    Partners: Female   Other Topics Concern  . None   Social History Narrative   Lives with his wife.  Three children are grown.  Younger daughter will graduate from [employer] in 01/2013.  Both sons and oldest daughter are in [place]. Exercises regularly.   His wife retired 12/2015 and is enjoying spending time in the yard, and with their family.       Review of Systems  Constitutional: Negative.   HENT: Positive for hearing loss and trouble swallowing. Negative for congestion, dental problem, drooling, ear discharge, ear pain, facial swelling, mouth sores, nosebleeds, postnasal drip, rhinorrhea, sinus pressure, sneezing, sore throat, tinnitus and voice change.   Eyes: Positive for visual disturbance (sees ophthalmology). Negative for photophobia, pain, discharge, redness and itching.  Respiratory: Positive for cough and choking.        Baseline, not worse  Cardiovascular: Negative.   Gastrointestinal: Negative.   Endocrine: Negative.   Genitourinary: Negative.    Musculoskeletal: Positive for myalgias, joint swelling and arthralgias (LEFT hip). Negative for back pain, gait problem, neck pain and neck stiffness.  Skin: Negative.   Allergic/Immunologic: Negative.   Neurological: Positive for headaches (occasional). Negative for dizziness, tremors, seizures, syncope, facial asymmetry, speech difficulty, weakness, light-headedness and numbness.  Hematological: Negative for adenopathy. Bruises/bleeds easily.  Psychiatric/Behavioral: Positive for dysphoric mood. Negative for suicidal ideas, hallucinations, behavioral problems, confusion, sleep disturbance, self-injury, decreased concentration and agitation. The patient is not nervous/anxious and is not hyperactive.         Objective:  Physical Exam  Constitutional: He is oriented to person, place, and time. He appears well-developed and well-nourished. He is active and cooperative. No distress.  BP 120/79 mmHg  Pulse 81  Temp(Src) 98.3 F (36.8 C)  Resp 16  Ht 6' 0.5" (1.842 m)  Wt 216 lb (97.977 kg)  BMI 28.88 kg/m2  HENT:  Head: Normocephalic and atraumatic.  Right Ear: Hearing normal.  Left Ear: Hearing normal.  Eyes: Conjunctivae and EOM are normal. Pupils are equal, round, and reactive to light. No scleral icterus.  Neck: Normal range of motion. Neck supple. No thyromegaly present.  Cardiovascular: Normal rate, regular rhythm and normal heart sounds.   Pulses:      Radial pulses are 2+ on the right side, and 2+ on the left side.  Pulmonary/Chest: Effort normal and breath sounds normal.  Abdominal: Soft. Bowel sounds are normal.  Lymphadenopathy:       Head (right side): No tonsillar, no preauricular, no posterior auricular and no occipital adenopathy present.       Head (left side): No tonsillar, no preauricular, no posterior auricular and no occipital adenopathy present.    He has no cervical adenopathy.       Right: No supraclavicular adenopathy present.       Left: No supraclavicular  adenopathy present.  Neurological: He is alert and oriented to person, place, and time. No sensory deficit.  Skin: Skin is warm, dry and intact. Bruising (various bruises on both forearms, in various stages of healing.) noted. No rash noted. No cyanosis or erythema. Nails show no clubbing.  Psychiatric: He has a normal mood and affect. His speech is normal and behavior is normal.           Assessment & Plan:  1. Annual physical exam Age appropriate anticipatory guidance provided.  2. Need for hepatitis C screening test - Hepatitis C antibody  3. Osteoarthritis of left hip, unspecified osteoarthritis type Proceed per Dr. Mayer Camel. Will reach out to cardiology to see if they need to see him again to clear him for surgery.  4. Essential hypertension Controlled. Continue current treatment. - CBC with Differential/Platelet - Comprehensive metabolic panel - TSH - POCT urinalysis dipstick - POCT Microscopic Urinalysis ([facility])  5. Hyperlipidemia Await lab results. Adjust regimen if needed. - Comprehensive metabolic panel - Lipid panel  6. Depression Stable/controlled. Today is the happiest I have ever seen him.  7. BPH associated with nocturia DRE last year. PSA rose slightly. He did not return in 6 months for repeat, as advised. Also has been treated for UTI in the interim. - PSA  8. Rising PSA level See above. - PSA   Fara Chute, PA-C Physician Assistant-Certified [facility]

## 2016-01-06 ENCOUNTER — Other Ambulatory Visit: Payer: Self-pay | Admitting: Orthopedic Surgery

## 2016-01-06 LAB — COMPREHENSIVE METABOLIC PANEL
ALT: 14 U/L (ref 9–46)
AST: 14 U/L (ref 10–35)
Albumin: 4 g/dL (ref 3.6–5.1)
Alkaline Phosphatase: 83 U/L (ref 40–115)
BUN: 17 mg/dL (ref 7–25)
CO2: 27 mmol/L (ref 20–31)
Calcium: 8.8 mg/dL (ref 8.6–10.3)
Chloride: 103 mmol/L (ref 98–110)
Creat: 0.94 mg/dL (ref 0.70–1.25)
Glucose, Bld: 81 mg/dL (ref 65–99)
Potassium: 4.2 mmol/L (ref 3.5–5.3)
Sodium: 140 mmol/L (ref 135–146)
Total Bilirubin: 0.9 mg/dL (ref 0.2–1.2)
Total Protein: 6.9 g/dL (ref 6.1–8.1)

## 2016-01-06 LAB — CBC WITH DIFFERENTIAL/PLATELET
Basophils Absolute: 0 cells/uL (ref 0–200)
Basophils Relative: 0 %
Eosinophils Absolute: 124 cells/uL (ref 15–500)
Eosinophils Relative: 2 %
HCT: 37.1 % — ABNORMAL LOW (ref 38.5–50.0)
Hemoglobin: 12.2 g/dL — ABNORMAL LOW (ref 13.2–17.1)
Lymphocytes Relative: 12 %
Lymphs Abs: 744 cells/uL — ABNORMAL LOW (ref 850–3900)
MCH: 29.2 pg (ref 27.0–33.0)
MCHC: 32.9 g/dL (ref 32.0–36.0)
MCV: 88.8 fL (ref 80.0–100.0)
MPV: 10.4 fL (ref 7.5–12.5)
Monocytes Absolute: 372 cells/uL (ref 200–950)
Monocytes Relative: 6 %
Neutro Abs: 4960 cells/uL (ref 1500–7800)
Neutrophils Relative %: 80 %
Platelets: 201 10*3/uL (ref 140–400)
RBC: 4.18 MIL/uL — ABNORMAL LOW (ref 4.20–5.80)
RDW: 14.1 % (ref 11.0–15.0)
WBC: 6.2 10*3/uL (ref 3.8–10.8)

## 2016-01-06 LAB — HEPATITIS C ANTIBODY: HCV Ab: NEGATIVE

## 2016-01-06 LAB — PSA: PSA: 1.75 ng/mL (ref <=4.00)

## 2016-01-06 LAB — LIPID PANEL
Cholesterol: 157 mg/dL (ref 125–200)
HDL: 46 mg/dL (ref >=40)
LDL Cholesterol: 99 mg/dL (ref <130)
Total CHOL/HDL Ratio: 3.4 Ratio (ref <=5.0)
Triglycerides: 62 mg/dL (ref <150)
VLDL: 12 mg/dL (ref <30)

## 2016-01-06 LAB — TSH: TSH: 1.67 mIU/L (ref 0.40–4.50)

## 2016-01-10 ENCOUNTER — Encounter: Payer: Self-pay | Admitting: Physician Assistant

## 2016-01-15 ENCOUNTER — Encounter (HOSPITAL_COMMUNITY)
Admission: RE | Admit: 2016-01-15 | Discharge: 2016-01-15 | Disposition: A | Discharge status: Home / Self Care | Payer: 59 | Source: Ambulatory Visit | Attending: Orthopedic Surgery | Admitting: Orthopedic Surgery

## 2016-01-15 ENCOUNTER — Ambulatory Visit (HOSPITAL_COMMUNITY)
Admission: RE | Admit: 2016-01-15 | Discharge: 2016-01-15 | Disposition: A | Discharge status: Home / Self Care | Payer: 59 | Source: Ambulatory Visit | Attending: Orthopedic Surgery | Admitting: Orthopedic Surgery

## 2016-01-15 ENCOUNTER — Encounter (HOSPITAL_COMMUNITY): Payer: Self-pay

## 2016-01-15 DIAGNOSIS — Z01818 Encounter for other preprocedural examination: Secondary | ICD-10-CM | POA: Insufficient documentation

## 2016-01-15 DIAGNOSIS — Z01810 Encounter for preprocedural cardiovascular examination: Secondary | ICD-10-CM | POA: Insufficient documentation

## 2016-01-15 LAB — BASIC METABOLIC PANEL
Anion gap: 10 (ref 5–15)
BUN: 12 mg/dL (ref 6–20)
CO2: 25 mmol/L (ref 22–32)
Calcium: 9.1 mg/dL (ref 8.9–10.3)
Chloride: 102 mmol/L (ref 101–111)
Creatinine, Ser: 1.07 mg/dL (ref 0.61–1.24)
GFR calc Af Amer: >60 mL/min (ref >60)
GFR calc non Af Amer: >60 mL/min (ref >60)
Glucose, Bld: 111 mg/dL — ABNORMAL HIGH (ref 65–99)
Potassium: 3.9 mmol/L (ref 3.5–5.1)
Sodium: 137 mmol/L (ref 135–145)

## 2016-01-15 LAB — CBC WITH DIFFERENTIAL/PLATELET
Basophils Absolute: 0 10*3/uL (ref 0.0–0.1)
Basophils Relative: 0 %
Eosinophils Absolute: 0.1 10*3/uL (ref 0.0–0.7)
Eosinophils Relative: 1 %
HCT: 38.5 % — ABNORMAL LOW (ref 39.0–52.0)
Hemoglobin: 12.7 g/dL — ABNORMAL LOW (ref 13.0–17.0)
Lymphocytes Relative: 13 %
Lymphs Abs: 1 10*3/uL (ref 0.7–4.0)
MCH: 29.2 pg (ref 26.0–34.0)
MCHC: 33 g/dL (ref 30.0–36.0)
MCV: 88.5 fL (ref 78.0–100.0)
Monocytes Absolute: 0.5 10*3/uL (ref 0.1–1.0)
Monocytes Relative: 7 %
Neutro Abs: 5.8 10*3/uL (ref 1.7–7.7)
Neutrophils Relative %: 79 %
Platelets: 194 10*3/uL (ref 150–400)
RBC: 4.35 MIL/uL (ref 4.22–5.81)
RDW: 13.9 % (ref 11.5–15.5)
WBC: 7.5 10*3/uL (ref 4.0–10.5)

## 2016-01-15 LAB — URINE MICROSCOPIC-ADD ON

## 2016-01-15 LAB — PROTIME-INR
INR: 1.09 (ref 0.00–1.49)
Prothrombin Time: 14.3 seconds (ref 11.6–15.2)

## 2016-01-15 LAB — TYPE AND SCREEN
ABO/RH(D): A POS
Antibody Screen: NEGATIVE

## 2016-01-15 LAB — URINALYSIS, ROUTINE W REFLEX MICROSCOPIC
Bilirubin Urine: NEGATIVE
Glucose, UA: NEGATIVE mg/dL
Hgb urine dipstick: NEGATIVE
Ketones, ur: NEGATIVE mg/dL
Nitrite: NEGATIVE
Protein, ur: NEGATIVE mg/dL
Specific Gravity, Urine: 1.015 (ref 1.005–1.030)
pH: 7 (ref 5.0–8.0)

## 2016-01-15 LAB — ABO/RH: ABO/RH(D): A POS

## 2016-01-15 LAB — APTT: aPTT: 30 seconds (ref 24–37)

## 2016-01-15 LAB — SURGICAL PCR SCREEN
MRSA, PCR: NEGATIVE
Staphylococcus aureus: POSITIVE — AB

## 2016-01-15 MED ORDER — CHLORHEXIDINE GLUCONATE 4 % EX LIQD: 60.0000 mL | Freq: Once | CUTANEOUS | Status: DC

## 2016-01-15 NOTE — Pre-Procedure Instructions (Signed)
    Chris Welch  01/15/2016      [facility] - [place], [place] - [address] [place] [address] Phone: [phone] Fax: [phone]    Your procedure is scheduled on Jan 27, 2016.  Report to [facility] Admitting at 10:45 A.M.  Call this number if you have problems the morning of surgery:  [phone]   Remember:  Do not eat food or drink liquids after midnight.  Take these medicines the morning of surgery with A SIP OF WATER :buPROPion (WELLBUTRIN XL) , fluticasone (FLONASE), omeprazole (PRILOSEC), tamsulosin  (FLOMAX); IF NEEDED-traMADol (ULTRAM), albuterol - bring with you   STOP ASPIRIN, NSAID'S- meloxicam (MOBIC), IBUPROFEN, ADVIL, HERBAL MEDICATIONS ONE WEEK PRIOR TO SURGERY 5/3   Do not wear jewelry.  Do not wear lotions, powders, or cologens.  You may wear deodorant.  Men may shave face and neck.  Do not bring valuables to the hospital.  [facility] is not responsible for any belongings or valuables.  Contacts, dentures or bridgework may not be worn into surgery.  Leave your suitcase in the car.  After surgery it may be brought to your room.  For patients admitted to the hospital, discharge time will be determined by your treatment team.  Patients discharged the day of surgery will not be allowed to drive home.   Name and phone number of your driver:    Special instructions:  "preparing for surgery"  Please read over the following fact sheets that you were given. Pain Booklet, Coughing and Deep Breathing, Blood Transfusion Information, Total Joint Packet and Surgical Site Infection Prevention

## 2016-01-18 NOTE — Progress Notes (Addendum)
Anesthesia Chart Review: Patient is a 70 year old male scheduled for left THA, anterior approach on 01/27/16 by Dr. Mayer Camel. Case is posted for spinal anesthesia.  History includes non-smoker, GERD, BPH, depression, paraganglioma tumor (glomus vagale) s/p resection of right cervical paraganglioma (with vagus nerve sacrificed) '08, right vocal cord paralysis, diverticulitis, HLD, HTN, gastropathy (reactive), cholecystectomy, left L3-4 microdiscectomy '14, right IHR (age 47), right knee surgeries X 5.  - PCP is listed as Dr. Nena Jordan. Last visit with Harrison Mons, PA-C on 01/05/16 for annual exam. Patient notified her of surgery plans. She plans to "reach out to cardiology to see if they need to see him again to clear him for surgery." - GI is Dr. Wilfrid Lund, last visit 01/05/16. - Cardiologist is Dr. Candee Furbish, last visit 06/09/15, for follow-up hypotension (in the setting of urosepsis) and atypical chest pain. Reportedly, patient with prior normal stress tests in 2012 and 2015. Continued risk factor modification recommended; however, would consider LHC if symptoms became "more typical or more worrisome." He denied chest pain and SOB since at his PAT visit on 01/15/16.   Meds include albuterol, ASA 81mg , Wellbutrin XL, Flonase, Robinul, losartan, Prilosec, pravastatin, Flomax, tramadol, trazodone, Viagra.  01/15/16 EKG: NSR.  02/06/14 Nuclear stress test [facility]): Impression: 1. Indeterminate stress test since target heart rate was not achieved.  2. No chest pain, shortness of breath, arrhythmia, or EKGs changes to suggest ischemia.  3. Correlate with nuclear study reported separately. (Nuclear report is not scanned in our system, so it was requested from [facility].)   01/15/16 CXR: IMPRESSION: No active cardiopulmonary disease.  Preoperative labs noted.   I have reached out to [facility] to see if she has heard back from Dr. Marlou Porch or if Dr. Damita Dunnings office will  need to fax a surgical clearance to his office. Chart left for follow-up her response and also nuclear portion of 2015 stress test.  [facility] Short Stay Center/Anesthesiology Phone [phone] 01/18/2016 4:41 PM  Addendum:   The nuclear report from his 02/06/14 stress test at [facility] showed: IMPRESSION: 1. No scintigraphic evidence of Lexiscan-induced ischemia. 2. Normal LV systolic function. LVEF 68%. 3. No wall motion abnormality seen. 4. No TID noted. (TID index of 0.83 which is within normal limits.) 5. Clinical correlation recommended.   I also heard back from [facility]. She wrote, "Yes, I did hear back from Dr. Marlou Porch. Mr. Jimeno had a low risk study about 2 years ago, and Dr. Marlou Porch said he doesn't need to re-evaluate him..."   Patient with recent PCP evaluation. Dr. Marlou Porch evaluated patient within the past year and did not feel he needed to be re-evaluated prior to surgery. Patient denied any further CV symptoms at PAT, so if no acute changes then I would anticipate that he could proceed as planned.  [facility] Short Stay Center/Anesthesiology Phone [phone] 01/19/2016 10:25 AM

## 2016-01-20 MED FILL — CLINDAMYCIN HCL 150 MG CAP: 150 | 4 days supply | Qty: 16 | Fill #0

## 2016-01-20 MED FILL — MUPIROCIN 2% OINTMENT: 2 | 5 days supply | Qty: 22 | Fill #0

## 2016-01-26 NOTE — H&P (Signed)
TOTAL HIP ADMISSION H&P  Patient is admitted for left total hip arthroplasty.  Subjective:  Chief Complaint: left hip pain  HPI: Chris Welch, 70 y.o. male, has a history of pain and functional disability in the left hip(s) due to arthritis and patient has failed non-surgical conservative treatments for greater than 12 weeks to include NSAID's and/or analgesics, corticosteriod injections, flexibility and strengthening excercises, weight reduction as appropriate and activity modification.  Onset of symptoms was gradual starting 2 years ago with gradually worsening course since that time.The patient noted no past surgery on the left hip(s).  Patient currently rates pain in the left hip at 10 out of 10 with activity. Patient has night pain, worsening of pain with activity and weight bearing, pain that interfers with activities of daily living and pain with passive range of motion. Patient has evidence of joint space narrowing by imaging studies. This condition presents safety issues increasing the risk of falls.  There is no current active infection.  Patient Active Problem List   Diagnosis Date Noted  . Degenerative joint disease (DJD) of hip 01/05/2016  . Atypical chest pain 04/22/2014  . Gastric reflux with aspiration 03/04/2014  . Carpal tunnel syndrome 11/26/2013  . HTN (hypertension) 11/26/2013  . AR (allergic rhinitis) 11/26/2013  . Low sodium levels 03/13/2013  . BPH associated with nocturia 08/21/2012  . Hyperlipidemia 08/21/2012  . Recurrent aspiration bronchitis/pneumonia ([place]) 10/05/2011  . Diverticulosis 10/05/2011  . Hearing loss of both ears 10/05/2011  . Depression 10/05/2011  . Vocal cord paralysis 01/17/2007   Past Medical History  Diagnosis Date  . Depression   . GERD (gastroesophageal reflux disease)   . Enlarged prostate   . 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 Ear infection ear infection lasting past 2 months, still  ongoing, pt on antibiotics & drops. pt states "ear infection has eaten holes through bilateral ear drums,  I am hard of hearing"  . Vocal cord paralysis     Right  . Diverticulosis   . Diverticulitis   . 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 Hyperlipidemia   . Hypertension   . Esophageal stricture   . Gastropathy     reactive  . Pneumonia     x 3, last 2009    Past Surgical History  Procedure Laterality Date  . Tumor right side of neck  12/2006    resection of R cervical paraganglioma  . Deep neck lymph node biopsy / excision    . Patella arthroplasty Right     has had 5 surgeries right knee  . Cholecystectomy    . Lumbar laminectomy/decompression microdiscectomy Left 03/09/2013    Procedure: LUMBAR LAMINECTOMY/DECOMPRESSION MICRODISCECTOMY 1 LEVEL;  Surgeon: Eustace Moore, MD;  Location: [facility];  Service: Neurosurgery;  Laterality: Left;  Left lumbar three-four extra-foraminal microdiscectomy  . Inguinal hernia repair Right     age 78    No prescriptions prior to admission   Allergies  Allergen Reactions  . Hydrocodone Nausea And Vomiting  . Penicillins Other (See Comments) and Rash    Ulcers on eyes    Social History  Substance Use Topics  . Smoking status: Never Smoker   . Smokeless tobacco: Never Used  . Alcohol Use: 0.0 oz/week    0 Standard drinks or equivalent per week     Comment: 1 drink a day    Family History  Problem Relation Age of Onset  .  Arthritis Brother     back  . Lung cancer Brother 57     (dx to death: 30 days)  . Cancer Brother     rare cancer-unknown type  . Heart disease Mother   . Emphysema Father      Review of Systems  Constitutional: Negative.   HENT: Positive for hearing loss and tinnitus.   Eyes: Negative.   Respiratory: Negative.   Cardiovascular:       HTN  Gastrointestinal: Negative.   Genitourinary: Negative.   Musculoskeletal: Positive for joint pain.  Skin: Negative.   Neurological: Negative.    Endo/Heme/Allergies: Bruises/bleeds easily.  Psychiatric/Behavioral: The patient is nervous/anxious.     Objective:  Physical Exam  Constitutional: He appears well-developed and well-nourished.  HENT:  Head: Normocephalic and atraumatic.  Eyes: Pupils are equal, round, and reactive to light.  Neck: Normal range of motion. Neck supple.  Cardiovascular: Intact distal pulses.   Respiratory: Effort normal.  Musculoskeletal: He exhibits tenderness.  The left hip remains irritable to any internal rotation.  He walks with a left-sided limp.    Skin: Skin is warm and dry.  Psychiatric: He has a normal mood and affect. His behavior is normal. Judgment and thought content normal.    Vital signs in last 24 hours:    Labs:   Estimated body mass index is 29.14 kg/(m^2) as calculated from the following:   Height as of 01/05/16: 6' 0.5" (1.842 m).   Weight as of 01/05/16: 98.884 kg (218 lb).   Imaging Review Plain radiographs demonstrate only a millimeter cartilage remaining in the superior weight-bearing dome of the left hip  Assessment/Plan:  End stage arthritis, left hip(s)  The patient history, physical examination, clinical judgement of the provider and imaging studies are consistent with end stage degenerative joint disease of the left hip(s) and total hip arthroplasty is deemed medically necessary. The treatment options including medical management, injection therapy, arthroscopy and arthroplasty were discussed at length. The risks and benefits of total hip arthroplasty were presented and reviewed. The risks due to aseptic loosening, infection, stiffness, dislocation/subluxation,  thromboembolic complications and other imponderables were discussed.  The patient acknowledged the explanation, agreed to proceed with the plan and consent was signed. Patient is being admitted for inpatient treatment for surgery, pain control, PT, OT, prophylactic antibiotics, VTE prophylaxis, progressive  ambulation and ADL's and discharge planning.The patient is planning to be discharged home with home health services

## 2016-01-27 ENCOUNTER — Encounter (HOSPITAL_COMMUNITY): Payer: Self-pay | Admitting: General Practice

## 2016-01-27 ENCOUNTER — Encounter (HOSPITAL_COMMUNITY)
Admission: RE | Disposition: A | Discharge status: Home Health | Payer: Self-pay | Source: Ambulatory Visit | Attending: Orthopedic Surgery

## 2016-01-27 ENCOUNTER — Inpatient Hospital Stay (HOSPITAL_COMMUNITY)
Admission: RE | Admit: 2016-01-27 | Discharge: 2016-01-28 | DRG: 470 | Disposition: A | Discharge status: Home Health | Payer: 59 | Source: Ambulatory Visit | Attending: Orthopedic Surgery | Admitting: Orthopedic Surgery

## 2016-01-27 ENCOUNTER — Inpatient Hospital Stay (HOSPITAL_COMMUNITY): Payer: 59 | Admitting: Certified Registered Nurse Anesthetist

## 2016-01-27 ENCOUNTER — Inpatient Hospital Stay (HOSPITAL_COMMUNITY): Payer: 59 | Admitting: Vascular Surgery

## 2016-01-27 DIAGNOSIS — Z79899 Other long term (current) drug therapy: Secondary | ICD-10-CM | POA: Diagnosis not present

## 2016-01-27 DIAGNOSIS — Z880 Allergy status to penicillin: Secondary | ICD-10-CM

## 2016-01-27 DIAGNOSIS — M25552 Pain in left hip: Secondary | ICD-10-CM | POA: Diagnosis not present

## 2016-01-27 DIAGNOSIS — Z419 Encounter for procedure for purposes other than remedying health state, unspecified: Secondary | ICD-10-CM

## 2016-01-27 DIAGNOSIS — Z885 Allergy status to narcotic agent status: Secondary | ICD-10-CM

## 2016-01-27 DIAGNOSIS — Z8261 Family history of arthritis: Secondary | ICD-10-CM | POA: Diagnosis not present

## 2016-01-27 DIAGNOSIS — J449 Chronic obstructive pulmonary disease, unspecified: Secondary | ICD-10-CM | POA: Diagnosis present

## 2016-01-27 DIAGNOSIS — M1612 Unilateral primary osteoarthritis, left hip: Principal | ICD-10-CM | POA: Diagnosis present

## 2016-01-27 DIAGNOSIS — K219 Gastro-esophageal reflux disease without esophagitis: Secondary | ICD-10-CM | POA: Diagnosis present

## 2016-01-27 DIAGNOSIS — Z7951 Long term (current) use of inhaled steroids: Secondary | ICD-10-CM | POA: Diagnosis not present

## 2016-01-27 DIAGNOSIS — H9319 Tinnitus, unspecified ear: Secondary | ICD-10-CM | POA: Diagnosis present

## 2016-01-27 DIAGNOSIS — H9193 Unspecified hearing loss, bilateral: Secondary | ICD-10-CM | POA: Diagnosis present

## 2016-01-27 DIAGNOSIS — J309 Allergic rhinitis, unspecified: Secondary | ICD-10-CM | POA: Diagnosis present

## 2016-01-27 DIAGNOSIS — R269 Unspecified abnormalities of gait and mobility: Secondary | ICD-10-CM | POA: Diagnosis not present

## 2016-01-27 DIAGNOSIS — E785 Hyperlipidemia, unspecified: Secondary | ICD-10-CM | POA: Diagnosis present

## 2016-01-27 DIAGNOSIS — M169 Osteoarthritis of hip, unspecified: Secondary | ICD-10-CM | POA: Diagnosis not present

## 2016-01-27 DIAGNOSIS — Z791 Long term (current) use of non-steroidal anti-inflammatories (NSAID): Secondary | ICD-10-CM | POA: Diagnosis not present

## 2016-01-27 DIAGNOSIS — D62 Acute posthemorrhagic anemia: Secondary | ICD-10-CM | POA: Diagnosis not present

## 2016-01-27 DIAGNOSIS — I10 Essential (primary) hypertension: Secondary | ICD-10-CM | POA: Diagnosis present

## 2016-01-27 HISTORY — DX: Unspecified osteoarthritis, unspecified site: M19.90

## 2016-01-27 HISTORY — PX: TOTAL HIP ARTHROPLASTY: SHX124

## 2016-01-27 HISTORY — DX: Unspecified hearing loss, unspecified ear: H91.90

## 2016-01-27 SURGERY — ARTHROPLASTY, HIP, TOTAL, ANTERIOR APPROACH
Anesthesia: Spinal | Site: Hip | Laterality: Left

## 2016-01-27 MED ORDER — ONDANSETRON HCL 4 MG PO TABS: 4.0000 mg | ORAL_TABLET | Freq: Four times a day (QID) | ORAL | Status: DC | PRN

## 2016-01-27 MED ORDER — FLUTICASONE PROPIONATE 50 MCG/ACT NA SUSP
2.0000 | Freq: Every day | NASAL | Status: DC
  Administered 2016-01-28: 2 via NASAL
  Filled 2016-01-27: qty 16

## 2016-01-27 MED ORDER — PHENOL 1.4 % MT LIQD: 1.0000 | OROMUCOSAL | Status: DC | PRN

## 2016-01-27 MED ORDER — PHENYLEPHRINE HCL 10 MG/ML IJ SOLN
10.0000 mg | INTRAMUSCULAR | Status: DC | PRN
Start: 2016-01-27 — End: 2016-01-27
  Administered 2016-01-27: 20 ug/min via INTRAVENOUS

## 2016-01-27 MED ORDER — ACETAMINOPHEN 325 MG PO TABS: 650.0000 mg | ORAL_TABLET | Freq: Four times a day (QID) | ORAL | Status: DC | PRN

## 2016-01-27 MED ORDER — VANCOMYCIN HCL IN DEXTROSE 1-5 GM/200ML-% IV SOLN
1000.0000 mg | INTRAVENOUS | Status: AC
  Administered 2016-01-27: 1000 mg via INTRAVENOUS
  Filled 2016-01-27: qty 200

## 2016-01-27 MED ORDER — FENTANYL CITRATE (PF) 100 MCG/2ML IJ SOLN
INTRAMUSCULAR | Status: DC | PRN
  Administered 2016-01-27: 50 ug via INTRAVENOUS

## 2016-01-27 MED ORDER — KCL IN DEXTROSE-NACL 20-5-0.45 MEQ/L-%-% IV SOLN
INTRAVENOUS | Status: DC
  Administered 2016-01-28: 02:00:00 via INTRAVENOUS
  Filled 2016-01-27: qty 1000

## 2016-01-27 MED ORDER — DEXAMETHASONE SODIUM PHOSPHATE 10 MG/ML IJ SOLN
10.0000 mg | Freq: Once | INTRAMUSCULAR | Status: AC
  Administered 2016-01-28: 10 mg via INTRAVENOUS
  Filled 2016-01-27: qty 1

## 2016-01-27 MED ORDER — DIPHENHYDRAMINE HCL 12.5 MG/5ML PO ELIX: 12.5000 mg | ORAL_SOLUTION | ORAL | Status: DC | PRN

## 2016-01-27 MED ORDER — ASPIRIN EC 325 MG PO TBEC: 325.0000 mg | DELAYED_RELEASE_TABLET | Freq: Two times a day (BID) | ORAL | Status: DC

## 2016-01-27 MED ORDER — OFLOXACIN 0.3 % OP SOLN
5.0000 [drp] | Freq: Every day | OPHTHALMIC | Status: DC
  Administered 2016-01-28: 5 [drp] via OTIC
  Filled 2016-01-27: qty 5

## 2016-01-27 MED ORDER — 0.9 % SODIUM CHLORIDE (POUR BTL) OPTIME
TOPICAL | Status: DC | PRN
  Administered 2016-01-27: 1000 mL

## 2016-01-27 MED ORDER — BUPIVACAINE IN DEXTROSE 0.75-8.25 % IT SOLN
INTRATHECAL | Status: DC | PRN
  Administered 2016-01-27: 15 mg via INTRATHECAL

## 2016-01-27 MED ORDER — DOCUSATE SODIUM 100 MG PO CAPS
100.0000 mg | ORAL_CAPSULE | Freq: Two times a day (BID) | ORAL | Status: DC
  Administered 2016-01-27 – 2016-01-28 (×2): 100 mg via ORAL
  Filled 2016-01-27 (×2): qty 1

## 2016-01-27 MED ORDER — METOCLOPRAMIDE HCL 5 MG/ML IJ SOLN
5.0000 mg | Freq: Three times a day (TID) | INTRAMUSCULAR | Status: DC | PRN
  Administered 2016-01-27: 10 mg via INTRAVENOUS
  Filled 2016-01-27: qty 2

## 2016-01-27 MED ORDER — DEXTROSE-NACL 5-0.45 % IV SOLN: INTRAVENOUS | Status: DC

## 2016-01-27 MED ORDER — BUPIVACAINE LIPOSOME 1.3 % IJ SUSP
20.0000 mL | INTRAMUSCULAR | Status: DC
  Filled 2016-01-27: qty 20

## 2016-01-27 MED ORDER — BUPIVACAINE HCL (PF) 0.25 % IJ SOLN
INTRAMUSCULAR | Status: AC
  Filled 2016-01-27: qty 30

## 2016-01-27 MED ORDER — PANTOPRAZOLE SODIUM 40 MG PO TBEC
80.0000 mg | DELAYED_RELEASE_TABLET | Freq: Every day | ORAL | Status: DC
  Administered 2016-01-28: 80 mg via ORAL
  Filled 2016-01-27: qty 2

## 2016-01-27 MED ORDER — OXYCODONE-ACETAMINOPHEN 5-325 MG PO TABS: 1.0000 | ORAL_TABLET | ORAL | Status: DC | PRN

## 2016-01-27 MED ORDER — TRANEXAMIC ACID 1000 MG/10ML IV SOLN
2000.0000 mg | Freq: Once | INTRAVENOUS | Status: AC
  Administered 2016-01-27: 2000 mg via TOPICAL
  Filled 2016-01-27: qty 20

## 2016-01-27 MED ORDER — OXYCODONE HCL 5 MG PO TABS
5.0000 mg | ORAL_TABLET | ORAL | Status: DC | PRN
  Administered 2016-01-27 – 2016-01-28 (×6): 10 mg via ORAL
  Filled 2016-01-27 (×6): qty 2

## 2016-01-27 MED ORDER — HYDROMORPHONE HCL 1 MG/ML IJ SOLN
1.0000 mg | INTRAMUSCULAR | Status: DC | PRN
  Administered 2016-01-27 (×2): 1 mg via INTRAVENOUS
  Filled 2016-01-27 (×2): qty 1

## 2016-01-27 MED ORDER — ALBUTEROL SULFATE (2.5 MG/3ML) 0.083% IN NEBU: 2.5000 mg | INHALATION_SOLUTION | Freq: Four times a day (QID) | RESPIRATORY_TRACT | Status: DC | PRN

## 2016-01-27 MED ORDER — ACETAMINOPHEN 650 MG RE SUPP: 650.0000 mg | Freq: Four times a day (QID) | RECTAL | Status: DC | PRN

## 2016-01-27 MED ORDER — POLYETHYLENE GLYCOL 3350 17 G PO PACK: 17.0000 g | PACK | Freq: Every day | ORAL | Status: DC | PRN

## 2016-01-27 MED ORDER — MIDAZOLAM HCL 2 MG/2ML IJ SOLN
INTRAMUSCULAR | Status: AC
  Filled 2016-01-27: qty 2

## 2016-01-27 MED ORDER — ALUMINUM HYDROXIDE GEL 320 MG/5ML PO SUSP: 15.0000 mL | ORAL | Status: DC | PRN

## 2016-01-27 MED ORDER — TRANEXAMIC ACID 1000 MG/10ML IV SOLN
1000.0000 mg | INTRAVENOUS | Status: AC
  Administered 2016-01-27: 1000 mg via INTRAVENOUS
  Filled 2016-01-27: qty 10

## 2016-01-27 MED ORDER — MEPERIDINE HCL 25 MG/ML IJ SOLN: 6.2500 mg | INTRAMUSCULAR | Status: DC | PRN

## 2016-01-27 MED ORDER — ALBUMIN HUMAN 5 % IV SOLN
INTRAVENOUS | Status: DC | PRN
  Administered 2016-01-27: 14:00:00 via INTRAVENOUS

## 2016-01-27 MED ORDER — FENTANYL CITRATE (PF) 100 MCG/2ML IJ SOLN
INTRAMUSCULAR | Status: AC
  Administered 2016-01-27: 100 ug
  Filled 2016-01-27: qty 2

## 2016-01-27 MED ORDER — TAMSULOSIN HCL 0.4 MG PO CAPS
0.8000 mg | ORAL_CAPSULE | Freq: Every day | ORAL | Status: DC
Start: 2016-01-28 — End: 2016-01-28
  Administered 2016-01-28: 0.8 mg via ORAL
  Filled 2016-01-27: qty 2

## 2016-01-27 MED ORDER — MIDAZOLAM HCL 2 MG/2ML IJ SOLN
INTRAMUSCULAR | Status: DC | PRN
  Administered 2016-01-27: 2 mg via INTRAVENOUS

## 2016-01-27 MED ORDER — METHOCARBAMOL 1000 MG/10ML IJ SOLN
500.0000 mg | INTRAVENOUS | Status: AC
  Filled 2016-01-27: qty 5

## 2016-01-27 MED ORDER — BUPROPION HCL ER (XL) 150 MG PO TB24
300.0000 mg | ORAL_TABLET | Freq: Every day | ORAL | Status: DC
  Administered 2016-01-28: 300 mg via ORAL
  Filled 2016-01-27: qty 2

## 2016-01-27 MED ORDER — METHOCARBAMOL 1000 MG/10ML IJ SOLN
500.0000 mg | Freq: Four times a day (QID) | INTRAVENOUS | Status: DC | PRN
  Filled 2016-01-27: qty 5

## 2016-01-27 MED ORDER — BISACODYL 5 MG PO TBEC: 5.0000 mg | DELAYED_RELEASE_TABLET | Freq: Every day | ORAL | Status: DC | PRN

## 2016-01-27 MED ORDER — TRAZODONE HCL 50 MG PO TABS
50.0000 mg | ORAL_TABLET | Freq: Every day | ORAL | Status: DC
  Administered 2016-01-27: 50 mg via ORAL
  Filled 2016-01-27: qty 1

## 2016-01-27 MED ORDER — TIZANIDINE HCL 2 MG PO TABS: 2.0000 mg | ORAL_TABLET | Freq: Four times a day (QID) | ORAL | Status: DC | PRN

## 2016-01-27 MED ORDER — ONDANSETRON HCL 4 MG/2ML IJ SOLN
4.0000 mg | Freq: Four times a day (QID) | INTRAMUSCULAR | Status: DC | PRN
  Administered 2016-01-27: 4 mg via INTRAVENOUS
  Filled 2016-01-27: qty 2

## 2016-01-27 MED ORDER — METOCLOPRAMIDE HCL 5 MG PO TABS: 5.0000 mg | ORAL_TABLET | Freq: Three times a day (TID) | ORAL | Status: DC | PRN

## 2016-01-27 MED ORDER — METHOCARBAMOL 500 MG PO TABS
500.0000 mg | ORAL_TABLET | Freq: Four times a day (QID) | ORAL | Status: DC | PRN
  Administered 2016-01-27 – 2016-01-28 (×4): 500 mg via ORAL
  Filled 2016-01-27 (×4): qty 1

## 2016-01-27 MED ORDER — PROPOFOL 500 MG/50ML IV EMUL
INTRAVENOUS | Status: DC | PRN
  Administered 2016-01-27: 25 ug/kg/min via INTRAVENOUS

## 2016-01-27 MED ORDER — MENTHOL 3 MG MT LOZG: 1.0000 | LOZENGE | OROMUCOSAL | Status: DC | PRN

## 2016-01-27 MED ORDER — PHENYLEPHRINE 40 MCG/ML (10ML) SYRINGE FOR IV PUSH (FOR BLOOD PRESSURE SUPPORT)
PREFILLED_SYRINGE | INTRAVENOUS | Status: DC | PRN
  Administered 2016-01-27 (×5): 80 ug via INTRAVENOUS

## 2016-01-27 MED ORDER — LACTATED RINGERS IV SOLN
INTRAVENOUS | Status: DC
  Administered 2016-01-27 (×2): via INTRAVENOUS

## 2016-01-27 MED ORDER — LOSARTAN POTASSIUM 50 MG PO TABS
50.0000 mg | ORAL_TABLET | Freq: Every day | ORAL | Status: DC
  Administered 2016-01-28: 50 mg via ORAL
  Filled 2016-01-27: qty 1

## 2016-01-27 MED ORDER — ASPIRIN EC 325 MG PO TBEC
325.0000 mg | DELAYED_RELEASE_TABLET | Freq: Every day | ORAL | Status: DC
  Administered 2016-01-28: 325 mg via ORAL
  Filled 2016-01-27: qty 1

## 2016-01-27 MED ORDER — MIDAZOLAM HCL 2 MG/2ML IJ SOLN: 0.5000 mg | Freq: Once | INTRAMUSCULAR | Status: DC | PRN

## 2016-01-27 MED ORDER — FENTANYL CITRATE (PF) 250 MCG/5ML IJ SOLN
INTRAMUSCULAR | Status: AC
  Filled 2016-01-27: qty 5

## 2016-01-27 MED ORDER — PROPOFOL 10 MG/ML IV BOLUS
INTRAVENOUS | Status: DC | PRN
  Administered 2016-01-27: 10 mg via INTRAVENOUS

## 2016-01-27 MED ORDER — HYDROMORPHONE HCL 1 MG/ML IJ SOLN: 0.2500 mg | INTRAMUSCULAR | Status: DC | PRN

## 2016-01-27 MED ORDER — GLYCOPYRROLATE 1 MG PO TABS
1.0000 mg | ORAL_TABLET | Freq: Three times a day (TID) | ORAL | Status: DC
  Administered 2016-01-27 – 2016-01-28 (×2): 1 mg via ORAL
  Filled 2016-01-27 (×4): qty 1

## 2016-01-27 MED ORDER — FLEET ENEMA 7-19 GM/118ML RE ENEM: 1.0000 | ENEMA | Freq: Once | RECTAL | Status: DC | PRN

## 2016-01-27 SURGICAL SUPPLY — 50 items
BAG DECANTER FOR FLEXI CONT (MISCELLANEOUS) ×1 IMPLANT
BLADE SURG ROTATE 9660 (MISCELLANEOUS) IMPLANT
CAPT HIP TOTAL 2 ×1 IMPLANT
COVER PERINEAL POST (MISCELLANEOUS) ×2 IMPLANT
COVER SURGICAL LIGHT HANDLE (MISCELLANEOUS) ×2 IMPLANT
DRAPE C-ARM 42X72 X-RAY (DRAPES) ×2 IMPLANT
DRAPE STERI IOBAN 125X83 (DRAPES) ×2 IMPLANT
DRAPE U-SHAPE 47X51 STRL (DRAPES) ×4 IMPLANT
DRSG AQUACEL AG ADV 3.5X10 (GAUZE/BANDAGES/DRESSINGS) ×2 IMPLANT
DURAPREP 26ML APPLICATOR (WOUND CARE) ×2 IMPLANT
ELECT BLADE 4.0 EZ CLEAN MEGAD (MISCELLANEOUS) ×2
ELECT REM PT RETURN 9FT ADLT (ELECTROSURGICAL) ×2
ELECTRODE BLDE 4.0 EZ CLN MEGD (MISCELLANEOUS) ×1 IMPLANT
ELECTRODE REM PT RTRN 9FT ADLT (ELECTROSURGICAL) ×1 IMPLANT
FACESHIELD WRAPAROUND (MASK) ×4 IMPLANT
FACESHIELD WRAPAROUND OR TEAM (MASK) ×2 IMPLANT
GLOVE BIO SURGEON STRL SZ7.5 (GLOVE) ×2 IMPLANT
GLOVE BIO SURGEON STRL SZ8.5 (GLOVE) ×4 IMPLANT
GLOVE BIOGEL PI IND STRL 8 (GLOVE) ×2 IMPLANT
GLOVE BIOGEL PI IND STRL 9 (GLOVE) ×1 IMPLANT
GLOVE BIOGEL PI INDICATOR 8 (GLOVE) ×2
GLOVE BIOGEL PI INDICATOR 9 (GLOVE) ×1
GOWN STRL REUS W/ TWL LRG LVL3 (GOWN DISPOSABLE) ×1 IMPLANT
GOWN STRL REUS W/ TWL XL LVL3 (GOWN DISPOSABLE) ×2 IMPLANT
GOWN STRL REUS W/TWL LRG LVL3 (GOWN DISPOSABLE) ×2
GOWN STRL REUS W/TWL XL LVL3 (GOWN DISPOSABLE) ×4
GUIDEWIRE NON THREAD 1.6MM (WIRE) IMPLANT
GUIDEWIRE THREADED 2.8 (WIRE) IMPLANT
KIT BASIN OR (CUSTOM PROCEDURE TRAY) ×2 IMPLANT
KIT ROOM TURNOVER OR (KITS) ×2 IMPLANT
MANIFOLD NEPTUNE II (INSTRUMENTS) ×2 IMPLANT
NS IRRIG 1000ML POUR BTL (IV SOLUTION) ×2 IMPLANT
PACK TOTAL JOINT (CUSTOM PROCEDURE TRAY) ×2 IMPLANT
PAD ARMBOARD 7.5X6 YLW CONV (MISCELLANEOUS) ×4 IMPLANT
SAW OSC TIP CART 19.5X105X1.3 (SAW) ×2 IMPLANT
SUT ETHIBOND NAB CT1 #1 30IN (SUTURE) ×5 IMPLANT
SUT VIC AB 0 CTX 36 (SUTURE) ×2
SUT VIC AB 0 CTX36XBRD ANTBCTR (SUTURE) ×1 IMPLANT
SUT VIC AB 1 CTX 36 (SUTURE) ×2
SUT VIC AB 1 CTX36XBRD ANBCTR (SUTURE) ×1 IMPLANT
SUT VIC AB 2-0 CT1 27 (SUTURE) ×2
SUT VIC AB 2-0 CT1 TAPERPNT 27 (SUTURE) IMPLANT
SUT VIC AB 2-0 CTX 27 (SUTURE) ×2 IMPLANT
SUT VIC AB 3-0 PS2 18 (SUTURE) ×2
SUT VIC AB 3-0 PS2 18XBRD (SUTURE) ×1 IMPLANT
SYRINGE 60CC LL (MISCELLANEOUS) IMPLANT
TOWEL OR 17X24 6PK STRL BLUE (TOWEL DISPOSABLE) ×2 IMPLANT
TOWEL OR 17X26 10 PK STRL BLUE (TOWEL DISPOSABLE) ×2 IMPLANT
TRAY FOLEY CATH 14FR (SET/KITS/TRAYS/PACK) IMPLANT
WATER STERILE IRR 1000ML POUR (IV SOLUTION) ×1 IMPLANT

## 2016-01-27 NOTE — Interval H&P Note (Signed)
History and Physical Interval Note:  01/27/2016 11:30 AM  Chris Welch  has presented today for surgery, with the diagnosis of LEFT HIP OSTEOARTHRITIS  The various methods of treatment have been discussed with the patient and family. After consideration of risks, benefits and other options for treatment, the patient has consented to  Procedure(s): TOTAL HIP ARTHROPLASTY ANTERIOR APPROACH (Left) as a surgical intervention .  The patient's history has been reviewed, patient examined, no change in status, stable for surgery.  I have reviewed the patient's chart and labs.  Questions were answered to the patient's satisfaction.     Kerin Salen

## 2016-01-27 NOTE — Discharge Instructions (Signed)

## 2016-01-27 NOTE — Anesthesia Preprocedure Evaluation (Addendum)
Anesthesia Evaluation  Patient identified by MRN, date of birth, ID band Patient awake    Reviewed: Allergy & Precautions, NPO status , Patient's Chart, lab work & pertinent test results  History of Anesthesia Complications Negative for: history of anesthetic complications  Airway Mallampati: II  TM Distance: >3 FB Neck ROM: Full    Dental  (+) Caps, Dental Advisory Given   Pulmonary COPD,    breath sounds clear to auscultation       Cardiovascular hypertension, Pt. 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  Neuromuscular disease (vocal cord palsy)    GI/Hepatic Neg liver ROS, GERD  Poorly Controlled,  Endo/Other  negative endocrine ROS  Renal/GU negative Renal ROS     Musculoskeletal  (+) Arthritis ,   Abdominal   Peds  Hematology negative hematology ROS (+)   Anesthesia Other Findings   Reproductive/Obstetrics                         Anesthesia Physical Anesthesia Plan  ASA: III  Anesthesia Plan: Spinal   Post-op Pain Management:    Induction:   Airway Management Planned: Natural Airway and Simple Face Mask  Additional Equipment:   Intra-op Plan:   Post-operative Plan:   Informed Consent: I have reviewed the patients History and Physical, chart, labs and discussed the procedure including the risks, benefits and alternatives for the proposed anesthesia with the patient or authorized representative who has indicated his/her understanding and acceptance.   Dental advisory given  Plan Discussed with: CRNA and Surgeon  Anesthesia Plan Comments: (Plan routine monitors, SAB)        Anesthesia Quick Evaluation

## 2016-01-27 NOTE — Transfer of Care (Signed)
Immediate Anesthesia Transfer of Care Note  Patient: Chris Welch  Procedure(s) Performed: Procedure(s): TOTAL HIP ARTHROPLASTY ANTERIOR APPROACH (Left)  Patient Location: PACU  Anesthesia Type:MAC and Spinal  Level of Consciousness: awake, alert , oriented and patient cooperative  Airway & Oxygen Therapy: Patient Spontanous Breathing  Post-op Assessment: Report given to RN and Post -op Vital signs reviewed and stable  Post vital signs: Reviewed and stable  Last Vitals:  Filed Vitals:   01/27/16 0926  BP: 160/93  Pulse: 93  Temp: 36.8 C    Last Pain:  Filed Vitals:   01/27/16 1407  PainSc: 8       Patients Stated Pain Goal: 5 ([id number])  Complications: No apparent anesthesia complications

## 2016-01-27 NOTE — Anesthesia Procedure Notes (Addendum)
Procedure Name: MAC Date/Time: 01/27/2016 11:45 AM Performed by: Salli Quarry WEAVER Pre-anesthesia Checklist: Patient identified, Emergency Drugs available, Suction available and Patient being monitored Patient Re-evaluated:Patient Re-evaluated prior to inductionOxygen Delivery Method: Simple face mask   Spinal Patient location during procedure: OR End time: 01/27/2016 12:04 PM Staffing Anesthesiologist: Annye Asa Performed by: anesthesiologist  Preanesthetic Checklist Completed: patient identified, surgical consent, pre-op evaluation, timeout performed, IV checked, risks and benefits discussed and monitors and equipment checked Spinal Block Patient position: sitting Prep: Betadine, ChloraPrep and site prepped and draped Patient monitoring: heart rate, cardiac monitor, continuous pulse ox and blood pressure Approach: midline Location: L2-3 Injection technique: single-shot Needle Needle type: Quincke  Needle gauge: 25 G Needle length: 9 cm Additional Notes Pt identified in Operating room.  Monitors applied. Working IV access confirmed. Sterile prep, drape lumbar spine.  1% lido local L 3,4, unable to enter CSF, 1% lido local L 2,3.  #25ga Quincke into clear CSF L 3,4.  15mg  0.75% Bupivacaine with dextrose injected with asp CSF beginning and end of injection.  Patient asymptomatic, VSS, no heme aspirated, tolerated well.  Jenita Seashore, MD

## 2016-01-27 NOTE — Op Note (Signed)
OPERATIVE REPORT    DATE OF PROCEDURE:  01/27/2016       PREOPERATIVE DIAGNOSIS:  LEFT HIP OSTEOARTHRITIS                                                          POSTOPERATIVE DIAGNOSIS:  LEFT HIP OSTEOARTHRITIS                                                           PROCEDURE: Anterior L total hip arthroplasty using a 52 mm DePuy Gryption  Cup, [employer], 0-degree polyethylene liner, a +1.5 mm 76mm ceramic head, a 9 Depuy Triloc stem   SURGEON: Brookelin Felber J    ASSISTANT:   Eric K. Barton Dubois  (present throughout entire procedure and necessary for timely completion of the procedure)   ANESTHESIA: Spinal BLOOD LOSS: 400cc FLUID REPLACEMENT: 1600 crystalloid Antibiotic: 1gm vancomycin Tranexamic Acid: 1gm iv, 2gm topical COMPLICATIONS: none    INDICATIONS FOR PROCEDURE: A 70 y.o. year-old With  LEFT HIP OSTEOARTHRITIS   for 4 years, x-rays show bone-on-bone arthritic changes, and osteophytes. Despite conservative measures with observation, anti-inflammatory medicine, narcotics, use of a cane, has severe unremitting pain and can ambulate only a few blocks before resting. Patient desires elective L total hip arthroplasty to decrease pain and increase function. The risks, benefits, and alternatives were discussed at length including but not limited to the risks of infection, bleeding, nerve injury, stiffness, blood clots, the need for revision surgery, cardiopulmonary complications, among others, and they were willing to proceed. Questions answered     PROCEDURE IN DETAIL: The patient was identified by armband,  received preoperative IV antibiotics in the holding area at [facility], taken to the operating room , appropriate anesthetic monitors  were attached and  anesthesia was induced with the patienton the gurney. The HANA boots were applied to the feet and he was then transferred to the HANA table with a peroneal post and support underneath the non-operative  le, which was locked in 5 lb traction. Theoperative lower extremity was then prepped and draped in the usual sterile fashion from just above the iliac crest to the knee. And a timeout procedure was performed. We then made a 12 cm incision along the interval at the leading edge of the tensor fascia lata of starting at 2 cm lateral to and 2 cm distal to the ASIS. Small bleeders in the skin and subcutaneous tissue identified and cauterized we dissected down to the fascia and made an incision in the fascia allowing [place] to elevate the fascia of the tensor muscle and exploited the interval between the rectus and the tensor fascia lata. A Hohmann retractor was then placed along the superior neck of the femur and a Cobra retractor along the inferior neck of the femur we teed the capsule starting out at the superior anterior aspect of the acetabulum going distally and made the T along the neck both leaflets of the T were tagged with #2 Ethibond suture. Cobra retractors were then placed along the inferior and superior neck allowing [place] to perform a standard neck cut and removed  the femoral head with a power corkscrew. We then placed a right angle Hohmann retractor along the anterior aspect of the acetabulum a spiked Cobra in the cotyloid notch and posteriorly a Muelller retractor. We then sequentially reamed up to a 51 mm basket reamer obtaining good coverage in all quadrants, verified by C-arm imaging. Under C-arm control with and hammered into place a 54 mm Pinnacle cup in 45 of abduction and 15 of anteversion. The cup seated nicely and required no supplemental screws. We then placed a central hole Eliminator and a 0 polyethylene liner. The foot was then externally rotated to 100, the HANA elevator was placed around the flare of the greater trochanter and the limb was extended and abducted delivering the proximal femur up into the wound. A medium Hohmann retractor was placed over the greater trochanter and a Mueller  retractor along the posterior femoral neck completing the exposure. We then performed releases superiorly and and inferiorly of the capsule going back to the pirformis fossa superiorly and to the lesser trochanter inferiorly. We then entered the proximal femur with the box cutting offset chisel followed by, a canal sounder, the chili pepper and broaching up to a 9 broach. This seated nicely and we reamed the calcar. A trial reduction was performed with a 1.5 mm 36 mm head.The limb lengths were excellent the hip was stable in 90 of external rotation. At this point the trial components removed and we hammered into place a # 9 Tri-Lock stem with Gryption coating. This was a hi offset stem and a + 1.5 36 mm ceramic ball was then hammered into place the hip was reduced and final C-arm images obtained. The wound was thoroughly irrigated with normal saline solution. We repaired the ant capsule and the tensor fascia lot a with running 0 vicryl suture. the subcutaneous tissue was closed with 2-0 and 3-0 Vicryl suture followed by an Aquacil dressing. At this point the patient was awaken and transferred to hospital gurney without difficulty. The subcutaneous tissue with 0 and 2-0 undyed Vicryl suture and the skin with running  3-0 vicryl subcuticular suture. Aquacil dressing was applied. The patient was then unclamped, rolled supine, awaken extubated and taken to recovery room without difficulty in stable condition.   Maniah Nading J 01/27/2016, 1:25 PM

## 2016-01-27 NOTE — Anesthesia Postprocedure Evaluation (Signed)
Anesthesia Post Note  Patient: Chris Welch  Procedure(s) Performed: Procedure(s) (LRB): TOTAL HIP ARTHROPLASTY ANTERIOR APPROACH (Left)  Patient location during evaluation: PACU Anesthesia Type: Spinal Level of consciousness: awake and alert, oriented and patient cooperative Pain management: pain level controlled Vital Signs Assessment: post-procedure vital signs reviewed and stable Respiratory status: spontaneous breathing, nonlabored ventilation and respiratory function stable Cardiovascular status: blood pressure returned to baseline and stable Postop Assessment: no headache, spinal receding, patient able to bend at knees and no signs of nausea or vomiting Anesthetic complications: no    Last Vitals:  Filed Vitals:   01/27/16 1445 01/27/16 1500  BP: 100/61 111/73  Pulse: 64 64  Temp:    Resp: 7 10    Last Pain:  Filed Vitals:   01/27/16 1511  PainSc: 8                  Cane Dubray,E. Gracee Ratterree

## 2016-01-28 ENCOUNTER — Encounter (HOSPITAL_COMMUNITY): Payer: Self-pay | Admitting: Orthopedic Surgery

## 2016-01-28 LAB — CBC
HCT: 32.9 % — ABNORMAL LOW (ref 39.0–52.0)
Hemoglobin: 11 g/dL — ABNORMAL LOW (ref 13.0–17.0)
MCH: 30.2 pg (ref 26.0–34.0)
MCHC: 33.4 g/dL (ref 30.0–36.0)
MCV: 90.4 fL (ref 78.0–100.0)
Platelets: 184 10*3/uL (ref 150–400)
RBC: 3.64 MIL/uL — ABNORMAL LOW (ref 4.22–5.81)
RDW: 13.9 % (ref 11.5–15.5)
WBC: 10.8 10*3/uL — ABNORMAL HIGH (ref 4.0–10.5)

## 2016-01-28 LAB — BASIC METABOLIC PANEL
Anion gap: 6 (ref 5–15)
BUN: 17 mg/dL (ref 6–20)
CO2: 29 mmol/L (ref 22–32)
Calcium: 8.7 mg/dL — ABNORMAL LOW (ref 8.9–10.3)
Chloride: 101 mmol/L (ref 101–111)
Creatinine, Ser: 1.16 mg/dL (ref 0.61–1.24)
GFR calc Af Amer: >60 mL/min (ref >60)
GFR calc non Af Amer: >60 mL/min (ref >60)
Glucose, Bld: 142 mg/dL — ABNORMAL HIGH (ref 65–99)
Potassium: 4.5 mmol/L (ref 3.5–5.1)
Sodium: 136 mmol/L (ref 135–145)

## 2016-01-28 MED FILL — OXYCODONE/APAP 5-325: 5-325 | 15 days supply | Qty: 60 | Fill #0

## 2016-01-28 MED FILL — tiZANidine HCL 2 MG TABS: 2 | 15 days supply | Qty: 60 | Fill #0

## 2016-01-28 NOTE — Progress Notes (Signed)
Patient ID: Chris Welch, male   DOB: 05-05-1946, 70 y.o.   MRN: [id number] PATIENT ID: Chris Welch  MRN: [id number]  DOB/AGE:  August 10, 1946 / 70 y.o.  1 Day Post-Op Procedure(s) (LRB): TOTAL HIP ARTHROPLASTY ANTERIOR APPROACH (Left)    PROGRESS NOTE Subjective: Patient is alert, oriented, x1 Nausea, no Vomiting, yes passing gas, . Taking PO well. Denies SOB, Chest or Calf Pain. Using Incentive Spirometer, PAS in place. Ambulate around the nurse's station last night without difficulty Patient reports pain as  6/10, has voided independently 2  .    Objective: Vital signs in last 24 hours: Filed Vitals:   01/27/16 1531 01/27/16 2100 01/28/16 0054 01/28/16 0604  BP: 134/72 95/72 92/59  91/64  Pulse: 70 95 89 84  Temp: 97.1 F (36.2 C) 97.9 F (36.6 C) 97.6 F (36.4 C) 98.4 F (36.9 C)  TempSrc: Oral Axillary Oral Oral  Resp: 11  14 15   Height:      Weight:      SpO2: 100% 100% 98% 100%      Intake/Output from previous day: I/O last 3 completed shifts: In: 2130 [P.O.:280; I.V.:1600; IV Piggyback:250] Out: 900 [Urine:550; Blood:350]   Intake/Output this shift:     LABORATORY DATA:  Recent Labs  01/28/16 0227  WBC 10.8*  HGB 11.0*  HCT 32.9*  PLT 184  NA 136  K 4.5  CL 101  CO2 29  BUN 17  CREATININE 1.16  GLUCOSE 142*  CALCIUM 8.7*    Examination: Neurologically intact ABD soft Neurovascular intact Sensation intact distally Intact pulses distally Dorsiflexion/Plantar flexion intact Incision: dressing C/D/I No cellulitis present Compartment soft} XR AP&Lat of hip shows well placed\fixed THA  Assessment:   1 Day Post-Op Procedure(s) (LRB): TOTAL HIP ARTHROPLASTY ANTERIOR APPROACH (Left) ADDITIONAL DIAGNOSIS:  Expected Acute Blood Loss Anemia, Hypertension, Gerd  Plan: PT/OT WBAT, THA  DVT Prophylaxis: SCDx72 hrs, ASA 325 mg BID x 2 weeks  DISCHARGE PLAN: Home, probably today  DISCHARGE NEEDS: HHPT, Walker and 3-in-1 comode  seat

## 2016-01-28 NOTE — Care Management Note (Signed)
Case Management Note  Patient Details  Name: Chris Welch MRN: [id number] Date of Birth: 1945-11-06  Subjective/Objective:   70 yr old male s/p left total hip arthroplasty                 Action/Plan: Case manager spoke with patient, wife and daughter as they were ambulating the hallway. Discussed home health and DME needs. Patient was preoperatively setup with [place], no changes. Patient requesting a cane rather than a walker, therapist has agreed that he will do fine with cane. Cane and 3in1 ordered. Patient will have family support at discharge. `      Expected Discharge Date:    01/28/16              Expected Discharge Plan:  [place]  In-House Referral:     Discharge planning Services  CM Consult  Post Acute Care Choice:  Durable Medical Equipment, Home Health Choice offered to:  Patient  DME Arranged:  3-N-1, Cane DME Agency:     HH Arranged:  PT [place]:  [place]  Status of Service:  Completed, signed off  Medicare Important Message Given:    Date Medicare IM Given:    Medicare IM give by:    Date Additional Medicare IM Given:    Additional Medicare Important Message give by:     If discussed at [place] of Stay Meetings, dates discussed:    Additional Comments:  Ninfa Meeker, RN 01/28/2016, 3:21 PM

## 2016-01-28 NOTE — Evaluation (Signed)
Physical Therapy Evaluation Patient Details Name: Chris Welch MRN: [id number] DOB: 1945/12/06 Today's Date: 01/28/2016   History of Present Illness  70 yo admitted for Lt anterior THA. PMHx: HTN, BPH, vocal cord paralysis  Clinical Impression  Pt with great mobility, able to ambulate and mobilize without assistance. Pt with decreased strength and ROM, who would benefit from therapy to maximize independence and improve quality of life. Educated pt on basic transfers, gait, HEP, and stairs. Encouraged ambulation throughout the day. Follow up and equipment recommendations listed below.     Follow Up Recommendations Home health PT    Equipment Recommendations  Rolling walker with 5" wheels    Recommendations for Other Services       Precautions / Restrictions Precautions Precautions: None Restrictions Weight Bearing Restrictions: Yes LLE Weight Bearing: Weight bearing as tolerated      Mobility  Bed Mobility Overal bed mobility: Modified Independent             General bed mobility comments: pt able to pivot to side of bed with increased time and use of rail. HOB elevated because pt's bed at home can elevate.  Transfers Overall transfer level: Needs assistance   Transfers: Sit to/from Stand Sit to Stand: Supervision         General transfer comment: cues for controlling descent  Ambulation/Gait Ambulation/Gait assistance: Supervision Ambulation Distance (Feet): 500 Feet Assistive device: Rolling walker (2 wheeled) Gait Pattern/deviations: Step-through pattern   Gait velocity interpretation: Below normal speed for age/gender General Gait Details: cues for position in walker and posture  Stairs Stairs: Yes Stairs assistance: Min assist Stair Management: No rails;Backwards;With walker;Step to pattern Number of Stairs: 4 General stair comments: educated pt and spouse on ascending backwards with walker and using wife as support. pt and spouse preferred  ascending with pt using wife as support.  Wheelchair Mobility    Modified Rankin (Stroke Patients Only)       Balance Overall balance assessment: No apparent balance deficits (not formally assessed)                                           Pertinent Vitals/Pain Pain Assessment: 0-10 Pain Score: 6  Pain Location: Lt THA Pain Descriptors / Indicators: Aching Pain Intervention(s): Limited activity within patient's tolerance;Monitored during session;Premedicated before session;Repositioned    Home Living Family/patient expects to be discharged to:: Private residence Living Arrangements: Spouse/significant other Available Help at Discharge: Family;Available 24 hours/day Type of Home: House Home Access: Stairs to enter   [employer] of Steps: 4 Home Layout: Two level;Bed/bath upstairs Home Equipment: Shower seat      Prior Function Level of Independence: Independent               Hand Dominance        Extremity/Trunk Assessment   Upper Extremity Assessment: Overall WFL for tasks assessed           Lower Extremity Assessment: LLE deficits/detail   LLE Deficits / Details: decreased ROM and strength as expected postop     Communication   Communication: HOH (bil hearing aids)  Cognition Arousal/Alertness: Awake/alert Behavior During Therapy: WFL for tasks assessed/performed Overall Cognitive Status: Within Functional Limits for tasks assessed                      General Comments      Exercises  Total Joint Exercises Heel Slides: AROM;Left;5 reps;Supine Hip ABduction/ADduction: AROM;Left;5 reps;Supine;10 reps;Standing (performed 5 supine, 10 standing) Knee Flexion: AROM;Left;10 reps;Standing Standing Hip Extension: AROM;Left;10 reps;Standing      Assessment/Plan    PT Assessment Patient needs continued PT services  PT Diagnosis Difficulty walking;Acute pain   PT Problem List Decreased strength;Decreased  range of motion;Decreased mobility;Decreased knowledge of use of DME;Pain  PT Treatment Interventions DME instruction;Gait training;Stair training;Functional mobility training;Therapeutic activities;Therapeutic exercise;Patient/family education   PT Goals (Current goals can be found in the Care Plan section) Acute Rehab PT Goals Patient Stated Goal: golf and ride my motorcycle PT Goal Formulation: With patient Time For Goal Achievement: 02/04/16 Potential to Achieve Goals: Good    Frequency 7X/week   Barriers to discharge        Co-evaluation               End of Session Equipment Utilized During Treatment: Gait belt Activity Tolerance: Patient tolerated treatment well;No increased pain Patient left: in chair;with call bell/phone within reach;with family/visitor present Nurse Communication: Mobility status         Time: [id number] PT Time Calculation (min) (ACUTE ONLY): 34 min   Charges:   PT Evaluation $PT Eval Moderate Complexity: 1 Procedure PT Treatments $Gait Training: 8-22 mins   PT G CodesHaze Justin 2016-02-05, 9:34 AM   Haze Justin, SPT [phone]

## 2016-01-28 NOTE — Progress Notes (Signed)
Reviewed discharge instructions with patient and patient's wife.  All their questions were answered.  They are waiting on a cane. Rito Ehrlich Tammera Engert 1:03 PM 01/28/2016

## 2016-01-28 NOTE — Evaluation (Signed)
Occupational Therapy Evaluation & Discharge  Patient Details Name: Chris Welch MRN: [id number] DOB: 06-15-1946 Today's Date: 01/28/2016    History of Present Illness 70 yo admitted for Lt anterior THA. PMHx: HTN, BPH, vocal cord paralysis   Clinical Impression   Patient admitted with above. Patient independent PTA. Patient currently functioning at an overall supervision to min assist level (mainly for LB ADLs).  No additional OT needs identified, D/C from acute OT services and no additional follow-up OT needs at this time. All appropriate education provided to patient and family (wife). Please re-order OT if needed.      Follow Up Recommendations  No OT follow up;Supervision - Intermittent    Equipment Recommendations  None recommended by OT    Recommendations for Other Services  None at this time    Precautions / Restrictions Precautions Precautions: None Restrictions Weight Bearing Restrictions: Yes LLE Weight Bearing: Weight bearing as tolerated      Mobility Bed Mobility - Per PT note Overal bed mobility: Modified Independent General bed mobility comments: pt able to pivot to side of bed with increased time and use of rail. HOB elevated because pt's bed at home can elevate.   Transfers Overall transfer level: Needs assistance Equipment used: Rolling walker (2 wheeled) Transfers: Sit to/from Stand Sit to Stand: Supervision General transfer comment: cues for hand placement, controlling descent, and overall safety     Balance Overall balance assessment: No apparent balance deficits (not formally assessed)    ADL Overall ADL's : Needs assistance/impaired Eating/Feeding: Set up;Sitting   Grooming: Set up;Sitting;Standing   Upper Body Bathing: Set up;Sitting   Lower Body Bathing: Moderate assistance;Sit to/from stand Lower Body Bathing Details (indicate cue type and reason): Pt and wife report wife can assist prn Upper Body Dressing : Set up;Sitting    Lower Body Dressing: Moderate assistance;Sit to/from stand Lower Body Dressing Details (indicate cue type and reason): Pt and wife report wife can assist prn Toilet Transfer: Supervision/safety;RW;BSC       Tub/ Shower Transfer: Agricultural engineer;Shower seat;Ambulation;Tub transfer     General ADL Comments: Pt overall supervision for functional ambulation/mobility using RW. Pt ambulated throughout room and in BR for toilet transfer. Pt then ambulated down hallway to therapy gym and engaged in tub/shower transfer using shower seat in tub/shower. Pt requires min cueing for overall safety and safety with RW. Wife will be present to assist at home prn.     Pertinent Vitals/Pain Pain Assessment: 0-10 Pain Score: 7  Pain Location: Left hip Pain Descriptors / Indicators: Aching Pain Intervention(s): Limited activity within patient's tolerance;Monitored during session;Premedicated before session;Repositioned;Ice applied     Hand Dominance Right   Extremity/Trunk Assessment Upper Extremity Assessment Upper Extremity Assessment: Overall WFL for tasks assessed   Lower Extremity Assessment Lower Extremity Assessment: Defer to PT evaluation LLE Deficits / Details: decreased ROM and strength as expected postop   Cervical / Trunk Assessment Cervical / Trunk Assessment: Normal   Communication Communication Communication: HOH (bil hearing aids)   Cognition Arousal/Alertness: Awake/alert Behavior During Therapy: WFL for tasks assessed/performed Overall Cognitive Status: Within Functional Limits for tasks assessed              Home Living Family/patient expects to be discharged to:: Private residence Living Arrangements: Spouse/significant other Available Help at Discharge: Family;Available 24 hours/day Type of Home: House Home Access: Stairs to enter [employer] of Steps: 4   Home Layout: Two level;Bed/bath upstairs Alternate Level Stairs-Number of Steps:  14 Alternate Level  Stairs-Rails: Right;Left;Can reach both Bathroom Shower/Tub: Teacher, early years/pre: Handicapped height     Home Equipment: Shower seat    Prior Functioning/Environment Level of Independence: Independent     OT Diagnosis: Generalized weakness;Acute pain   OT Problem List:   N/a, no acute OT needs identified at this time     OT Treatment/Interventions:   N/a, no acute OT needs identified at this time     OT Goals(Current goals can be found in the care plan section) Acute Rehab OT Goals Patient Stated Goal: golf and ride my motorcycle OT Goal Formulation: All assessment and education complete, DC therapy  OT Frequency:   N/a, no acute OT needs identified at this time     Barriers to D/C:  None known at this time    End of Session Equipment Utilized During Treatment: Rolling walker  Activity Tolerance: Patient tolerated treatment well Patient left: in chair;with call bell/phone within reach;with family/visitor present   Time: 0923-0950 OT Time Calculation (min): 27 min Charges:  OT General Charges $OT Visit: 1 Procedure OT Evaluation $OT Eval Low Complexity: 1 Procedure OT Treatments $Self Care/Home Management : 8-22 mins  Chrys Racer , MS, OTR/L, CLT Pager: [phone]  01/28/2016, 11:13 AM

## 2016-01-28 NOTE — Discharge Summary (Signed)
Patient ID: Chris Welch MRN: [id number] DOB/AGE: October 09, 1945 70 y.o.  Admit date: 01/27/2016 Discharge date: 01/28/2016  Admission Diagnoses:  Principal Problem:   Primary osteoarthritis of left hip Active Problems:   Arthritis of left hip   Discharge Diagnoses:  Same  Past Medical History  Diagnosis Date  . Depression   . GERD (gastroesophageal reflux disease)   . Enlarged prostate   . 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 Ear infection ear infection lasting past 2 months, still ongoing, pt on antibiotics & drops. pt states "ear infection has eaten holes through bilateral ear drums,  I am hard of hearing"  . Vocal cord paralysis     Right  . Diverticulosis   . Diverticulitis   . Hyperlipidemia   . Hypertension   . Esophageal stricture   . Gastropathy     reactive  . Hard of hearing   . Pneumonia     x 3, last 2009 (01/27/2016)  . Arthritis     "hips, knees" (01/27/2016)  . Malignant carotid body tumor [facility])     Surgeries: Procedure(s): TOTAL HIP ARTHROPLASTY ANTERIOR APPROACH on 01/27/2016   Consultants:    Discharged Condition: Improved  Hospital Course: DARIUSZ JUNTUNEN is an 70 y.o. male who was admitted 01/27/2016 for operative treatment ofPrimary osteoarthritis of left hip. Patient has severe unremitting pain that affects sleep, daily activities, and work/hobbies. After pre-op clearance the patient was taken to the operating room on 01/27/2016 and underwent  Procedure(s): TOTAL HIP ARTHROPLASTY ANTERIOR APPROACH.    Patient was given perioperative antibiotics: Anti-infectives    Start     Dose/Rate Route Frequency Ordered Stop   01/27/16 0859  vancomycin (VANCOCIN) IVPB 1000 mg/200 mL premix     1,000 mg 200 mL/hr over 60 Minutes Intravenous On call to O.R. 01/27/16 0859 01/27/16 1049       Patient was given sequential compression devices, early ambulation, and chemoprophylaxis to prevent DVT.Patient was stable  to ambulate up and down the hallway on the date of surgery, reports it pain is less than it was preoperatively morning after surgery.  Patient benefited maximally from hospital stay and there were no complications.    Recent vital signs: Patient Vitals for the past 24 hrs:  BP Temp Temp src Pulse Resp SpO2 Height Weight  01/28/16 0604 91/64 mmHg 98.4 F (36.9 C) Oral 84 15 100 % - -  01/28/16 0054 (!) 92/59 mmHg 97.6 F (36.4 C) Oral 89 14 98 % - -  01/27/16 2100 95/72 mmHg 97.9 F (36.6 C) Axillary 95 - 100 % - -  01/27/16 1531 134/72 mmHg 97.1 F (36.2 C) Oral 70 11 100 % - -  01/27/16 1515 - 97.8 F (36.6 C) - 63 (!) 8 100 % - -  01/27/16 1500 111/73 mmHg - - 64 10 100 % - -  01/27/16 1445 100/61 mmHg - - 64 (!) 7 100 % - -  01/27/16 1430 114/71 mmHg - - 76 11 99 % - -  01/27/16 1415 108/69 mmHg - - 79 13 95 % - -  01/27/16 1405 104/72 mmHg 97.2 F (36.2 C) - 84 16 96 % - -  01/27/16 0926 (!) 160/93 mmHg 98.2 F (36.8 C) - 93 - 97 % 6' 0.5" (1.842 m) 97.977 kg (216 lb)     Recent laboratory studies:  Recent Labs  01/28/16 0227  WBC 10.8*  HGB 11.0*  HCT 32.9*  PLT 184  NA 136  K 4.5  CL 101  CO2 29  BUN 17  CREATININE 1.16  GLUCOSE 142*  CALCIUM 8.7*     Discharge Medications:     Medication List    TAKE these medications        albuterol 108 (90 Base) MCG/ACT inhaler  Commonly known as:  PROVENTIL HFA;VENTOLIN HFA  Inhale 2 puffs into the lungs every 6 (six) hours as needed.     aspirin EC 325 MG tablet  Take 1 tablet (325 mg total) by mouth 2 (two) times daily.     buPROPion 150 MG 24 hr tablet  Commonly known as:  WELLBUTRIN XL  TAKE 3 TABLETS BY MOUTH DAILY.     fluticasone 50 MCG/ACT nasal spray  Commonly known as:  FLONASE  Place 2 sprays into both nostrils daily.     glycopyrrolate 1 MG tablet  Commonly known as:  ROBINUL  TAKE 1 TABLET BY MOUTH 3 TIMES DAILY     losartan 50 MG tablet  Commonly known as:  COZAAR  Take 1 tablet (50 mg  total) by mouth daily.     meloxicam 15 MG tablet  Commonly known as:  MOBIC  Take 15 mg by mouth daily.     ofloxacin 0.3 % otic solution  Commonly known as:  FLOXIN  Place 5 drops into both ears daily.     omeprazole 40 MG capsule  Commonly known as:  PRILOSEC  TAKE 1 CAPSULE BY MOUTH 2 TIMES DAILY.     oxyCODONE-acetaminophen 5-325 MG tablet  Commonly known as:  ROXICET  Take 1 tablet by mouth every 4 (four) hours as needed.     pravastatin 20 MG tablet  Commonly known as:  PRAVACHOL  Take 1 tablet (20 mg total) by mouth daily.     tamsulosin 0.4 MG Caps capsule  Commonly known as:  FLOMAX  TAKE 2 CAPSULES BY MOUTH ONCE DAILY     tiZANidine 2 MG tablet  Commonly known as:  ZANAFLEX  Take 1 tablet (2 mg total) by mouth every 6 (six) hours as needed for muscle spasms.     traMADol 50 MG tablet  Commonly known as:  ULTRAM  Take 1 tablet (50 mg total) by mouth every 8 (eight) hours as needed for pain.     traZODone 50 MG tablet  Commonly known as:  DESYREL  TAKE 1 TABLET BY MOUTH AT BEDTIME     VIAGRA 100 MG tablet  Generic drug:  sildenafil  TAKE 1/2-1 TABLET BY MOUTH DAILY AS NEEDED FOR ERECTILE DYSFUNCTION        Diagnostic Studies: Dg Chest 2 View  01/15/2016  CLINICAL DATA:  Preop evaluation for left hip arthroplasty. EXAM: CHEST  2 VIEW COMPARISON:  03/09/2013 FINDINGS: The cardiomediastinal contours are normal. The lungs are clear. Pulmonary vasculature is normal. No consolidation, pleural effusion, or pneumothorax. No acute osseous abnormalities are seen. IMPRESSION: No active cardiopulmonary disease. Electronically Signed   By: Jeb Levering M.D.   On: 01/15/2016 13:47    Disposition: 01-Home or Self Care      Discharge Instructions    Call MD / Call 911    Complete by:  As directed   If you experience chest pain or shortness of breath, CALL 911 and be transported to the hospital emergency room.  If you develope a fever above 101 F, pus (white  drainage) or increased drainage or redness at the wound, or calf pain, call  your surgeon's office.     Change dressing    Complete by:  As directed   Do not change dressing unless it has more than 50% drainage     Constipation Prevention    Complete by:  As directed   Drink plenty of fluids.  Prune juice may be helpful.  You may use a stool softener, such as Colace (over the counter) 100 mg twice a day.  Use MiraLax (over the counter) for constipation as needed.     Diet - low sodium heart healthy    Complete by:  As directed      Driving restrictions    Complete by:  As directed   No driving for 2 weeks     Increase activity slowly as tolerated    Complete by:  As directed            Follow-up Information    Follow up with Kerin Salen, MD In 2 weeks.   Specialty:  Orthopedic Surgery   Contact information:   [place] [address] [phone]       Follow up with Kerin Salen, MD In 2 weeks.   Specialty:  Orthopedic Surgery   Contact information:   [place] [address] [phone]        Signed: Kerin Salen 01/28/2016, 7:26 AM

## 2016-01-28 NOTE — Progress Notes (Signed)
Physical Therapy Progress Note Pt with continued excellent gait able to transition to cane and perform flight of stairs. Pt with HEP for home and education for mobility, transfers and progression. Pt safe for return home with family.    01/28/16 1152  PT Visit Information  Last PT Received On 01/28/16  Assistance Needed +1  History of Present Illness 70 yo admitted for Lt anterior THA. PMHx: HTN, BPH, vocal cord paralysis  Precautions  Precautions None  Restrictions  LLE Weight Bearing WBAT  Pain Assessment  Pain Score 6  Pain Location (left hip)  Pain Descriptors / Indicators Aching  Pain Intervention(s) Monitored during session;Repositioned;Limited activity within patient's tolerance  Cognition  Arousal/Alertness Awake/alert  Behavior During Therapy WFL for tasks assessed/performed  Overall Cognitive Status Within Functional Limits for tasks assessed  Bed Mobility  Overal bed mobility (in chair on arrival)  Transfers  Overall transfer level Modified independent  Ambulation/Gait  Ambulation/Gait assistance Supervision  Ambulation Distance (Feet) 550 Feet  Assistive device Rolling walker (2 wheeled);Straight cane  Gait Pattern/deviations Step-through pattern;Decreased stride length  General Gait Details (cues for posture with RW. sequence with cane use)  Stairs Yes  Stairs assistance Supervision  Stair Management Step to pattern;One rail Right  Number of Stairs 11  General stair comments cues for sequence for flight  Balance  Overall balance assessment No apparent balance deficits (not formally assessed)  Exercises  Exercises Total Joint  Total Joint Exercises  Hip ABduction/ADduction AROM;15 reps;Standing;Left  Knee Flexion AROM;Left;15 reps;Standing  Standing Hip Extension AROM;Left;15 reps;Standing  Marching in Standing AROM;Left;15 reps;Standing  PT - End of Session  Equipment Utilized During Treatment Gait belt  Activity Tolerance Patient tolerated treatment  well;No increased pain  Patient left in chair;with call bell/phone within reach;with family/visitor present  Nurse Communication Mobility status  PT - Assessment/Plan  PT Plan Current plan remains appropriate  Follow Up Recommendations Home health PT  PT equipment Cane  PT Goal Progression  Progress towards PT goals Progressing toward goals  PT Time Calculation  PT Start Time (ACUTE ONLY) 1135  PT Stop Time (ACUTE ONLY) 1200  PT Time Calculation (min) (ACUTE ONLY) 25 min  PT General Charges  $$ ACUTE PT VISIT 1 Procedure  PT Treatments  $Gait Training 8-22 mins  $Therapeutic Exercise 8-22 mins   Elwyn Reach, [place]

## 2016-01-29 DIAGNOSIS — I10 Essential (primary) hypertension: Secondary | ICD-10-CM | POA: Diagnosis not present

## 2016-01-29 DIAGNOSIS — Z96642 Presence of left artificial hip joint: Secondary | ICD-10-CM | POA: Diagnosis not present

## 2016-01-29 DIAGNOSIS — Z471 Aftercare following joint replacement surgery: Secondary | ICD-10-CM | POA: Diagnosis not present

## 2016-02-01 DIAGNOSIS — Z471 Aftercare following joint replacement surgery: Secondary | ICD-10-CM | POA: Diagnosis not present

## 2016-02-01 DIAGNOSIS — I10 Essential (primary) hypertension: Secondary | ICD-10-CM | POA: Diagnosis not present

## 2016-02-01 DIAGNOSIS — Z96642 Presence of left artificial hip joint: Secondary | ICD-10-CM | POA: Diagnosis not present

## 2016-02-03 DIAGNOSIS — Z96642 Presence of left artificial hip joint: Secondary | ICD-10-CM | POA: Diagnosis not present

## 2016-02-03 DIAGNOSIS — Z471 Aftercare following joint replacement surgery: Secondary | ICD-10-CM | POA: Diagnosis not present

## 2016-02-03 DIAGNOSIS — I10 Essential (primary) hypertension: Secondary | ICD-10-CM | POA: Diagnosis not present

## 2016-02-05 DIAGNOSIS — Z96642 Presence of left artificial hip joint: Secondary | ICD-10-CM | POA: Diagnosis not present

## 2016-02-05 DIAGNOSIS — I10 Essential (primary) hypertension: Secondary | ICD-10-CM | POA: Diagnosis not present

## 2016-02-05 DIAGNOSIS — Z471 Aftercare following joint replacement surgery: Secondary | ICD-10-CM | POA: Diagnosis not present

## 2016-02-08 DIAGNOSIS — Z96642 Presence of left artificial hip joint: Secondary | ICD-10-CM | POA: Diagnosis not present

## 2016-02-08 DIAGNOSIS — I10 Essential (primary) hypertension: Secondary | ICD-10-CM | POA: Diagnosis not present

## 2016-02-08 DIAGNOSIS — Z471 Aftercare following joint replacement surgery: Secondary | ICD-10-CM | POA: Diagnosis not present

## 2016-02-09 DIAGNOSIS — M1612 Unilateral primary osteoarthritis, left hip: Secondary | ICD-10-CM | POA: Diagnosis not present

## 2016-02-09 MED FILL — tiZANidine HCL 2 MG TABS: 2 | 20 days supply | Qty: 60 | Fill #0

## 2016-02-10 DIAGNOSIS — Z96642 Presence of left artificial hip joint: Secondary | ICD-10-CM | POA: Diagnosis not present

## 2016-02-10 DIAGNOSIS — Z471 Aftercare following joint replacement surgery: Secondary | ICD-10-CM | POA: Diagnosis not present

## 2016-02-10 DIAGNOSIS — I10 Essential (primary) hypertension: Secondary | ICD-10-CM | POA: Diagnosis not present

## 2016-02-16 DIAGNOSIS — Z471 Aftercare following joint replacement surgery: Secondary | ICD-10-CM | POA: Diagnosis not present

## 2016-02-16 DIAGNOSIS — I10 Essential (primary) hypertension: Secondary | ICD-10-CM | POA: Diagnosis not present

## 2016-02-16 DIAGNOSIS — Z96642 Presence of left artificial hip joint: Secondary | ICD-10-CM | POA: Diagnosis not present

## 2016-02-16 MED FILL — OXYCODONE/APAP 5-325: 5-325 | 30 days supply | Qty: 60 | Fill #0

## 2016-02-18 DIAGNOSIS — I10 Essential (primary) hypertension: Secondary | ICD-10-CM | POA: Diagnosis not present

## 2016-02-18 DIAGNOSIS — Z471 Aftercare following joint replacement surgery: Secondary | ICD-10-CM | POA: Diagnosis not present

## 2016-02-18 DIAGNOSIS — Z96642 Presence of left artificial hip joint: Secondary | ICD-10-CM | POA: Diagnosis not present

## 2016-03-03 MED FILL — traMADol HCL 50 MG TABS: 50 | 30 days supply | Qty: 60 | Fill #0

## 2016-03-08 MED FILL — PRAVASTATIN NA 20 MG TAB: 20 | 90 days supply | Qty: 90 | Fill #0

## 2016-03-08 MED FILL — MELOXICAM 15 MG TABLET: 15 | 30 days supply | Qty: 30 | Fill #0

## 2016-03-09 MED FILL — TAMSULOSIN HCL 0.4 MG CAP: 0.4 | 90 days supply | Qty: 180 | Fill #0

## 2016-03-18 MED FILL — PREVIDENT 5000 BOOSTER PLUS: 1.1 | 30 days supply | Qty: 100 | Fill #0

## 2016-03-18 MED FILL — CLINDAMYCIN HCL 300 MG CAPS: 300 | 10 days supply | Qty: 40 | Fill #0

## 2016-03-18 MED FILL — CLINDAMYCIN HCL 150 MG CAP: 150 | 4 days supply | Qty: 16 | Fill #0

## 2016-03-21 MED FILL — HYDROCODON-APAP 5-325: 5-325 | 2 days supply | Qty: 24 | Fill #0

## 2016-03-24 ENCOUNTER — Other Ambulatory Visit: Payer: Self-pay | Admitting: Internal Medicine

## 2016-03-24 MED FILL — LOSARTAN POTASSIUM 50 MG TA: 50 | 30 days supply | Qty: 30 | Fill #0

## 2016-04-26 MED FILL — CLINDAMYCIN HCL 300 MG CAPS: 300 | 10 days supply | Qty: 40 | Fill #0

## 2016-04-26 MED FILL — MELOXICAM 15 MG TABLET: 15 | 30 days supply | Qty: 30 | Fill #1

## 2016-05-03 ENCOUNTER — Other Ambulatory Visit: Payer: Self-pay | Admitting: Emergency Medicine

## 2016-05-03 MED FILL — LOSARTAN POTASSIUM 50 MG TA: 50 | 30 days supply | Qty: 30 | Fill #1

## 2016-05-03 MED FILL — OMEPRAZOLE DR 40 MG CAPSULE: 40 | 90 days supply | Qty: 180 | Fill #0

## 2016-05-05 ENCOUNTER — Other Ambulatory Visit: Payer: Self-pay | Admitting: Emergency Medicine

## 2016-05-06 ENCOUNTER — Telehealth: Payer: Self-pay

## 2016-05-06 NOTE — Telephone Encounter (Signed)
Patient request a refill of Omerprazole 40 MG, Bupropion 150 MG, Losartan 50 MG, and Glycoprrolate 1 MG. [facility] [employer].

## 2016-05-10 NOTE — Telephone Encounter (Signed)
Pt is taking glycoprrolate

## 2016-05-10 NOTE — Telephone Encounter (Signed)
Is the patient still taking glycoprrolate?

## 2016-05-12 MED ORDER — BUPROPION HCL ER (XL) 150 MG PO TB24: 450.0000 mg | ORAL_TABLET | Freq: Every day | ORAL | 0 refills | Status: DC

## 2016-05-12 MED ORDER — GLYCOPYRROLATE 1 MG PO TABS: 1.0000 mg | ORAL_TABLET | Freq: Three times a day (TID) | ORAL | 0 refills | Status: DC

## 2016-05-12 MED FILL — GLYCOPYRROLATE 1 MG TABLET: 1 | 30 days supply | Qty: 90 | Fill #0

## 2016-05-12 MED FILL — BUPROPION HCL XL 150 MG TAB: 150 | 30 days supply | Qty: 90 | Fill #0

## 2016-05-12 NOTE — Telephone Encounter (Signed)
Sent in RFs of bupropion and glycoprrolate, but there are remaining rfs of the other two meds written by other MDs on file at [facility] outpt that [facility] should be able to fill for him. LMOM advising all of the above.

## 2016-06-14 MED FILL — MELOXICAM 15 MG TABLET: 15 | 30 days supply | Qty: 30 | Fill #0

## 2016-06-14 MED FILL — LOSARTAN POTASSIUM 50 MG TA: 50 | 30 days supply | Qty: 30 | Fill #2

## 2016-06-17 ENCOUNTER — Other Ambulatory Visit: Payer: Self-pay | Admitting: *Deleted

## 2016-06-17 ENCOUNTER — Encounter: Payer: Self-pay | Admitting: *Deleted

## 2016-06-17 NOTE — Patient Outreach (Signed)
First high cost outreach call related to tight hip replacement in May 2017 completed. Spoke with patient via cell phone. He states his surgery went well and he was playing golf 3 weeks after his surgery and he is pain free. Care management needs assessed and no needs identified.  With patient's permission , will mail [facility] CM brochure with description of services and 24 hour Nurse Line magnet to patient should future needs arise. Barrington Ellison RN,CCM,CDE [place] Management Coordinator [facility] Office Phone [phone] Office Fax [phone]

## 2016-06-25 ENCOUNTER — Other Ambulatory Visit: Payer: Self-pay | Admitting: Emergency Medicine

## 2016-06-25 ENCOUNTER — Other Ambulatory Visit: Payer: Self-pay | Admitting: Physician Assistant

## 2016-06-30 NOTE — Telephone Encounter (Signed)
03/2016 last ov

## 2016-06-30 NOTE — Telephone Encounter (Signed)
01/2016 last ov and labs 03/08/13 last refill?  Using as needed for chronic knee pain "a rx. Lasts a long time"

## 2016-07-05 MED ORDER — BUPROPION HCL ER (XL) 150 MG PO TB24: 450.0000 mg | ORAL_TABLET | Freq: Every day | ORAL | 3 refills | Status: AC

## 2016-07-05 MED ORDER — TRAMADOL HCL 50 MG PO TABS: 50.0000 mg | ORAL_TABLET | Freq: Three times a day (TID) | ORAL | 0 refills | Status: DC | PRN

## 2016-07-05 MED FILL — traMADol HCL 50 MG TABS: 50 | 10 days supply | Qty: 30 | Fill #0

## 2016-07-05 MED FILL — BUPROPION HCL XL 150 MG TAB: 150 | 30 days supply | Qty: 90 | Fill #0

## 2016-07-05 NOTE — Telephone Encounter (Signed)
Meds ordered this encounter  Medications  . buPROPion (WELLBUTRIN XL) 150 MG 24 hr tablet    Sig: Take 3 tablets (450 mg total) by mouth daily.    Dispense:  270 tablet    Refill:  3

## 2016-07-05 NOTE — Telephone Encounter (Signed)
Rx printed at 104. Will bring to 102 after clinic.  Meds ordered this encounter  Medications  . traMADol (ULTRAM) 50 MG tablet    Sig: Take 1 tablet (50 mg total) by mouth every 8 (eight) hours as needed.    Dispense:  30 tablet    Refill:  0

## 2016-07-05 NOTE — Telephone Encounter (Signed)
Faxed tramadol Rx

## 2016-09-22 DIAGNOSIS — M79671 Pain in right foot: Secondary | ICD-10-CM | POA: Diagnosis not present

## 2016-09-23 MED FILL — CLINDAMYCIN HCL 150 MG CAP: 150 | 10 days supply | Qty: 30 | Fill #0

## 2016-09-28 DIAGNOSIS — H60333 Swimmer's ear, bilateral: Secondary | ICD-10-CM | POA: Diagnosis not present

## 2016-09-30 MED FILL — NEOMYCIN-POLYMYXIN-HC EAR S: 3.5-10000-1 | 22 days supply | Qty: 10 | Fill #0

## 2016-10-10 MED FILL — buPROPion HCL ER (XL) 150 M: 150 | 30 days supply | Qty: 90 | Fill #1

## 2016-10-13 DIAGNOSIS — M1711 Unilateral primary osteoarthritis, right knee: Secondary | ICD-10-CM | POA: Diagnosis not present

## 2016-10-13 DIAGNOSIS — M79671 Pain in right foot: Secondary | ICD-10-CM | POA: Diagnosis not present

## 2016-11-21 DIAGNOSIS — M545 Low back pain: Secondary | ICD-10-CM | POA: Diagnosis not present

## 2016-12-14 ENCOUNTER — Ambulatory Visit (HOSPITAL_COMMUNITY)
Admission: EM | Admit: 2016-12-14 | Discharge: 2016-12-14 | Disposition: A | Discharge status: Home / Self Care | Payer: Medicare Other | Attending: Family Medicine | Admitting: Family Medicine

## 2016-12-14 ENCOUNTER — Encounter (HOSPITAL_COMMUNITY): Payer: Self-pay | Admitting: Emergency Medicine

## 2016-12-14 DIAGNOSIS — R197 Diarrhea, unspecified: Secondary | ICD-10-CM

## 2016-12-14 DIAGNOSIS — K529 Noninfective gastroenteritis and colitis, unspecified: Secondary | ICD-10-CM

## 2016-12-14 MED ORDER — CIPROFLOXACIN HCL 250 MG PO TABS: 250.0000 mg | ORAL_TABLET | Freq: Two times a day (BID) | ORAL | 0 refills | Status: DC

## 2016-12-14 NOTE — ED Provider Notes (Signed)
CSN: [id number]     Arrival date & time 12/14/16  1937 History   First MD Initiated Contact with Patient 12/14/16 2022     Chief Complaint  Patient presents with  . Diarrhea   (Consider location/radiation/quality/duration/timing/severity/associated sxs/prior Treatment) Patient c/o Gastroenteritis and diarrhea.   The history is provided by the patient.  Diarrhea  Severity:  Moderate Onset quality:  Sudden Duration:  4 days Timing:  Constant Progression:  Worsening Relieved by:  Nothing Worsened by:  Nothing Ineffective treatments:  None tried   Past Medical History:  Diagnosis Date  . Arthritis    "hips, knees" (01/27/2016)  . Depression   . Diverticulitis   . Diverticulosis   . Ear infection ear infection lasting past 2 months, still ongoing, pt on antibiotics & drops. pt states "ear infection has eaten holes through bilateral ear drums,  I am hard of hearing"  . Enlarged prostate   . Esophageal stricture   . Gastropathy    reactive  . GERD (gastroesophageal reflux disease)   . Hard of hearing   . Hyperlipidemia   . Hypertension   . Malignant carotid body tumor ([place])   . Pneumonia    x 3, last 2009 (01/27/2016)  . 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 Vocal cord paralysis    Right   Past Surgical History:  Procedure Laterality Date  . APPENDECTOMY    . BACK SURGERY    . CAROTID BODY TUMOR EXCISION    . CERVICAL PARAGANGLIOMA EXCISION Right 12/2006  . CHOLECYSTECTOMY OPEN    . DEEP NECK LYMPH NODE BIOPSY / EXCISION    . ESOPHAGOGASTRODUODENOSCOPY (EGD) WITH ESOPHAGEAL DILATION    . INGUINAL HERNIA REPAIR Right 1954  . JOINT REPLACEMENT    . KNEE ARTHROSCOPY Right   . LUMBAR LAMINECTOMY/DECOMPRESSION MICRODISCECTOMY Left 03/09/2013   Procedure: LUMBAR LAMINECTOMY/DECOMPRESSION MICRODISCECTOMY 1 LEVEL;  Surgeon: Eustace Moore, MD;  Location: [facility];  Service: Neurosurgery;  Laterality: Left;  Left lumbar three-four  extra-foraminal microdiscectomy  . PATELLA ARTHROPLASTY Right    has had 5 surgeries right knee  . TOTAL HIP ARTHROPLASTY Left 01/27/2016  . TOTAL HIP ARTHROPLASTY Left 01/27/2016   Procedure: TOTAL HIP ARTHROPLASTY ANTERIOR APPROACH;  Surgeon: Frederik Pear, MD;  Location: [facility];  Service: Orthopedics;  Laterality: Left;  [name] Kitchen VOCAL CORD IMPLANT     Family History  Problem Relation Age of Onset  . Arthritis Brother     back  . Lung cancer Brother 44     (dx to death: 30 days)  . Cancer Brother     rare cancer-unknown type  . Heart disease Mother   . Emphysema Father    Social History  Substance Use Topics  . Smoking status: Never Smoker  . Smokeless tobacco: Never Used  . Alcohol use 1.8 oz/week    3 Cans of beer per week    Review of Systems  Constitutional: Negative.   HENT: Negative.   Eyes: Negative.   Respiratory: Negative.   Gastrointestinal: Positive for diarrhea.  Endocrine: Negative.   Genitourinary: Negative.   Musculoskeletal: Negative.   Skin: Negative.   Allergic/Immunologic: Negative.   Neurological: Negative.   Hematological: Negative.   Psychiatric/Behavioral: Negative.     Allergies  Hydrocodone and Penicillins  Home Medications   Prior to Admission medications   Medication Sig Start Date End Date Taking? Authorizing Provider  albuterol (PROVENTIL HFA;VENTOLIN HFA) 108 (90 BASE) MCG/ACT inhaler Inhale 2  puffs into the lungs every 6 (six) hours as needed. 08/30/14   Darlyne Russian, MD  aspirin EC 325 MG tablet Take 1 tablet (325 mg total) by mouth 2 (two) times daily. 01/27/16   Leighton Parody, PA-C  buPROPion (WELLBUTRIN XL) 150 MG 24 hr tablet Take 3 tablets (450 mg total) by mouth daily. 07/05/16   Chelle Jeffery, PA-C  ciprofloxacin (CIPRO) 250 MG tablet Take 1 tablet (250 mg total) by mouth every 12 (twelve) hours. 12/14/16   Lysbeth Penner, FNP  fluticasone (FLONASE) 50 MCG/ACT nasal spray Place 2 sprays into both nostrils daily. 11/26/13   Chelle  Jeffery, PA-C  glycopyrrolate (ROBINUL) 1 MG tablet Take 1 tablet (1 mg total) by mouth 3 (three) times daily. 05/12/16   Darlyne Russian, MD  losartan (COZAAR) 50 MG tablet TAKE 1 TABLET BY MOUTH ONCE DAILY 03/24/16   Jerline Pain, MD  meloxicam (MOBIC) 15 MG tablet Take 15 mg by mouth daily.  12/16/15   Historical Provider, MD  ofloxacin (FLOXIN) 0.3 % otic solution Place 5 drops into both ears daily. 05/07/14   Darlyne Russian, MD  omeprazole (PRILOSEC) 40 MG capsule TAKE 1 CAPSULE BY MOUTH 2 TIMES DAILY. 01/05/16   Nelida Meuse III, MD  oxyCODONE-acetaminophen (ROXICET) 5-325 MG tablet Take 1 tablet by mouth every 4 (four) hours as needed. 01/27/16   Leighton Parody, PA-C  pravastatin (PRAVACHOL) 20 MG tablet Take 1 tablet (20 mg total) by mouth daily. 06/09/15   Jerline Pain, MD  tamsulosin (FLOMAX) 0.4 MG CAPS capsule TAKE 2 CAPSULES BY MOUTH ONCE DAILY 07/24/15   Chelle Jeffery, PA-C  tiZANidine (ZANAFLEX) 2 MG tablet Take 1 tablet (2 mg total) by mouth every 6 (six) hours as needed for muscle spasms. 01/27/16   Leighton Parody, PA-C  traMADol (ULTRAM) 50 MG tablet Take 1 tablet (50 mg total) by mouth every 8 (eight) hours as needed. 07/05/16   Chelle Jeffery, PA-C  traZODone (DESYREL) 50 MG tablet TAKE 1 TABLET BY MOUTH AT BEDTIME 08/22/14   Darlyne Russian, MD  VIAGRA 100 MG tablet TAKE 1/2-1 TABLET BY MOUTH DAILY AS NEEDED FOR ERECTILE DYSFUNCTION 10/01/13   Harrison Mons, PA-C   Meds Ordered and Administered this Visit  Medications - No data to display  BP 133/85 (BP Location: Right Arm)   Pulse 88   Temp 98.4 F (36.9 C) (Oral)   Resp 18   SpO2 97%  No data found.   Physical Exam  Constitutional: He is oriented to person, place, and time. He appears well-developed and well-nourished.  HENT:  Head: Normocephalic.  Right Ear: External ear normal.  Left Ear: External ear normal.  Mouth/Throat: Oropharynx is clear and moist.  Eyes: Conjunctivae and EOM are normal. Pupils are equal, round,  and reactive to light.  Neck: Normal range of motion. Neck supple.  Cardiovascular: Normal rate, regular rhythm and normal heart sounds.   Pulmonary/Chest: Effort normal.  Abdominal: Soft. Bowel sounds are normal.  Musculoskeletal: Normal range of motion.  Neurological: He is alert and oriented to person, place, and time.  Nursing note and vitals reviewed.   Urgent Care Course     Procedures (including critical care time)  Labs Review Labs Reviewed - No data to display  Imaging Review No results found.   Visual Acuity Review  Right Eye Distance:   Left Eye Distance:   Bilateral Distance:    Right Eye Near:   Left Eye  Near:    Bilateral Near:         MDM   1. Diarrhea, unspecified type   2. Gastroenteritis   cipro 250mg  one po bid x 3 days #6 Push po fluids, rest, tylenol and motrin otc prn as directed for fever, arthralgias, and myalgias.  Follow up prn if sx's continue or persist.    Lysbeth Penner, FNP 12/14/16 2057

## 2016-12-14 NOTE — ED Triage Notes (Signed)
The patient presented to the [facility] with a complaint of diarrhea x 4 days.

## 2017-01-19 ENCOUNTER — Ambulatory Visit: Payer: 59 | Admitting: Family Medicine

## 2017-02-28 ENCOUNTER — Telehealth: Payer: Self-pay | Admitting: Behavioral Health

## 2017-02-28 NOTE — Telephone Encounter (Signed)
Unable to reach patient at time of Pre-Visit Call.  Left message for patient to return call when available.    

## 2017-03-01 ENCOUNTER — Encounter: Payer: Self-pay | Admitting: Family Medicine

## 2017-03-01 ENCOUNTER — Ambulatory Visit (INDEPENDENT_AMBULATORY_CARE_PROVIDER_SITE_OTHER): Payer: Medicare Other | Admitting: Family Medicine

## 2017-03-01 VITALS — BP 118/82 | HR 87 | Temp 98.0°F | Ht 75.0 in | Wt 216.0 lb

## 2017-03-01 DIAGNOSIS — L989 Disorder of the skin and subcutaneous tissue, unspecified: Secondary | ICD-10-CM | POA: Diagnosis not present

## 2017-03-01 DIAGNOSIS — Z130 Encounter for screening for diseases of the blood and blood-forming organs and certain disorders involving the immune mechanism: Secondary | ICD-10-CM

## 2017-03-01 DIAGNOSIS — I10 Essential (primary) hypertension: Secondary | ICD-10-CM | POA: Diagnosis not present

## 2017-03-01 DIAGNOSIS — R351 Nocturia: Secondary | ICD-10-CM

## 2017-03-01 DIAGNOSIS — Z5181 Encounter for therapeutic drug level monitoring: Secondary | ICD-10-CM

## 2017-03-01 DIAGNOSIS — R600 Localized edema: Secondary | ICD-10-CM

## 2017-03-01 DIAGNOSIS — N401 Enlarged prostate with lower urinary tract symptoms: Secondary | ICD-10-CM

## 2017-03-01 DIAGNOSIS — R7309 Other abnormal glucose: Secondary | ICD-10-CM | POA: Diagnosis not present

## 2017-03-01 DIAGNOSIS — R972 Elevated prostate specific antigen [PSA]: Secondary | ICD-10-CM

## 2017-03-01 DIAGNOSIS — Z131 Encounter for screening for diabetes mellitus: Secondary | ICD-10-CM | POA: Diagnosis not present

## 2017-03-01 DIAGNOSIS — R0601 Orthopnea: Secondary | ICD-10-CM

## 2017-03-01 DIAGNOSIS — E782 Mixed hyperlipidemia: Secondary | ICD-10-CM

## 2017-03-01 LAB — COMPREHENSIVE METABOLIC PANEL
ALT: 13 U/L (ref 0–53)
AST: 13 U/L (ref 0–37)
Albumin: 4 g/dL (ref 3.5–5.2)
Alkaline Phosphatase: 77 U/L (ref 39–117)
BUN: 14 mg/dL (ref 6–23)
CO2: 29 mEq/L (ref 19–32)
Calcium: 9 mg/dL (ref 8.4–10.5)
Chloride: 102 mEq/L (ref 96–112)
Creatinine, Ser: 1.02 mg/dL (ref 0.40–1.50)
GFR: 76.64 mL/min (ref >60.00)
Glucose, Bld: 93 mg/dL (ref 70–99)
Potassium: 4.1 mEq/L (ref 3.5–5.1)
Sodium: 137 mEq/L (ref 135–145)
Total Bilirubin: 1 mg/dL (ref 0.2–1.2)
Total Protein: 6.8 g/dL (ref 6.0–8.3)

## 2017-03-01 LAB — LIPID PANEL
Cholesterol: 149 mg/dL (ref 0–200)
HDL: 39.6 mg/dL (ref >39.00)
LDL Cholesterol: 93 mg/dL (ref 0–99)
NonHDL: 109.69
Total CHOL/HDL Ratio: 4
Triglycerides: 85 mg/dL (ref 0.0–149.0)
VLDL: 17 mg/dL (ref 0.0–40.0)

## 2017-03-01 LAB — BRAIN NATRIURETIC PEPTIDE: Pro B Natriuretic peptide (BNP): 59 pg/mL (ref 0.0–100.0)

## 2017-03-01 LAB — CBC
HCT: 37.2 % — ABNORMAL LOW (ref 39.0–52.0)
Hemoglobin: 12.4 g/dL — ABNORMAL LOW (ref 13.0–17.0)
MCHC: 33.4 g/dL (ref 30.0–36.0)
MCV: 88.4 fl (ref 78.0–100.0)
Platelets: 189 10*3/uL (ref 150.0–400.0)
RBC: 4.21 Mil/uL — ABNORMAL LOW (ref 4.22–5.81)
RDW: 14 % (ref 11.5–15.5)
WBC: 6.7 10*3/uL (ref 4.0–10.5)

## 2017-03-01 LAB — PSA: PSA: 1.39 ng/mL (ref 0.10–4.00)

## 2017-03-01 LAB — HEMOGLOBIN A1C: Hgb A1c MFr Bld: 5.5 % (ref 4.6–6.5)

## 2017-03-01 NOTE — Patient Instructions (Signed)
It was a pleasure to see you today- glad to see you doing well!  I will be in touch with your labs, and we will get you in with dermatology to look at the area on your left am  We will look for any cause of the swelling in your ankles.  Compression socks (put them on first thin in the am) can help to prevent swelling from developing during the day

## 2017-03-01 NOTE — Progress Notes (Addendum)
[place] at [facility] [address], [place], [place] [address] [phone]  Date:  03/01/2017   Name:  Chris Welch   DOB:  02/25/1946   MRN:  [id number]  PCP:  Darlyne Russian, MD    Chief Complaint: Establish Care (Pt here to est care . )   History of Present Illness:  Chris Welch is a 71 y.o. very pleasant male patient who presents with the following:  Here today as a new patient to [place], although I have seen him at [facility], most recently about 3 years ago.  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  They are not sure however if this may actually be due to GERD.  His ankles are not painful or uncomfortable He is able to pass urine ok So far today he ate toast, with a little bit of butter He had a microdiscectomy 2014, his back is doing "great" and his hip is also doing "great" following his replacement He does do PSA testing - will get for him today  He has had both of his pnuemonia vaccines  Skin lesion on his LEFT arm- it has been there for about 2 years.  It does not bleed but seems to be getting larger and more raised- his wife is concerned about this    Patient Active Problem List   Diagnosis Date Noted  . Primary  osteoarthritis of left hip 01/27/2016  . Arthritis of left hip 01/27/2016  . Degenerative joint disease (DJD) of hip 01/05/2016  . Atypical chest pain 04/22/2014  . Gastric reflux with aspiration 03/04/2014  . Carpal tunnel syndrome 11/26/2013  . HTN (hypertension) 11/26/2013  . AR (allergic rhinitis) 11/26/2013  . Low sodium levels 03/13/2013  . BPH associated with nocturia 08/21/2012  . Hyperlipidemia 08/21/2012  . Recurrent aspiration bronchitis/pneumonia ([place]) 10/05/2011  . Diverticulosis 10/05/2011  . Hearing loss of both ears 10/05/2011  . Depression 10/05/2011  . 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 Arthritis    "hips, knees" (01/27/2016)  . Depression   . Diverticulitis   . Diverticulosis   . Ear infection ear infection lasting past 2 months, still ongoing, pt on antibiotics & drops. pt states "ear infection has eaten holes through bilateral ear drums,  I am hard of hearing"  . Enlarged prostate   . Esophageal stricture   . Gastropathy    reactive  . GERD (gastroesophageal reflux disease)   . Hard of hearing   . Hyperlipidemia   . Hypertension   . Malignant carotid body tumor ([place])   . Pneumonia  x 3, last 2009 (01/27/2016)  . 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 Vocal cord paralysis    Right    Past Surgical History:  Procedure Laterality Date  . APPENDECTOMY    . BACK SURGERY    . CAROTID BODY TUMOR EXCISION    . CERVICAL PARAGANGLIOMA EXCISION Right 12/2006  . CHOLECYSTECTOMY OPEN    . DEEP NECK LYMPH NODE BIOPSY / EXCISION    . ESOPHAGOGASTRODUODENOSCOPY (EGD) WITH ESOPHAGEAL DILATION    . INGUINAL HERNIA REPAIR Right 1954  . JOINT REPLACEMENT    . KNEE ARTHROSCOPY Right   . LUMBAR LAMINECTOMY/DECOMPRESSION MICRODISCECTOMY Left 03/09/2013   Procedure: LUMBAR LAMINECTOMY/DECOMPRESSION MICRODISCECTOMY 1 LEVEL;  Surgeon: Eustace Moore, MD;  Location: [facility];  Service:  Neurosurgery;  Laterality: Left;  Left lumbar three-four extra-foraminal microdiscectomy  . PATELLA ARTHROPLASTY Right    has had 5 surgeries right knee  . TOTAL HIP ARTHROPLASTY Left 01/27/2016  . TOTAL HIP ARTHROPLASTY Left 01/27/2016   Procedure: TOTAL HIP ARTHROPLASTY ANTERIOR APPROACH;  Surgeon: Frederik Pear, MD;  Location: [facility];  Service: Orthopedics;  Laterality: Left;  [name] Kitchen VOCAL CORD IMPLANT      Social History  Substance Use Topics  . Smoking status: Never Smoker  . Smokeless tobacco: Never Used  . Alcohol use 1.8 oz/week    3 Cans of beer per week    Family History  Problem Relation Age of Onset  . Arthritis Brother        back  . Lung cancer Brother 86        (dx to death: 30 days)  . Cancer Brother        rare cancer-unknown type  . Heart disease Mother   . Emphysema Father     Allergies  Allergen Reactions  . Hydrocodone Nausea And Vomiting    "can not take on an empty stomach"  . Penicillins Other (See Comments) and Rash    Ulcers on eyes    Medication list has been reviewed and updated.  Current Outpatient Prescriptions on File Prior to Visit  Medication Sig Dispense Refill  . albuterol (PROVENTIL HFA;VENTOLIN HFA) 108 (90 BASE) MCG/ACT inhaler Inhale 2 puffs into the lungs every 6 (six) hours as needed. 1 Inhaler 4  . aspirin EC 325 MG tablet Take 1 tablet (325 mg total) by mouth 2 (two) times daily. 30 tablet 0  . buPROPion (WELLBUTRIN XL) 150 MG 24 hr tablet Take 3 tablets (450 mg total) by mouth daily. 270 tablet 3  . fluticasone (FLONASE) 50 MCG/ACT nasal spray Place 2 sprays into both nostrils daily. 48 g 4  . glycopyrrolate (ROBINUL) 1 MG tablet Take 1 tablet (1 mg total) by mouth 3 (three) times daily. 270 tablet 0  . losartan (COZAAR) 50 MG tablet TAKE 1 TABLET BY MOUTH ONCE DAILY 30 tablet 5  . meloxicam (MOBIC) 15 MG tablet Take 15 mg by mouth daily.   2  . ofloxacin (FLOXIN) 0.3 % otic solution Place 5 drops into both ears daily. 10 mL 0  .  omeprazole (PRILOSEC) 40 MG capsule TAKE 1 CAPSULE BY MOUTH 2 TIMES DAILY. 60 capsule 11  . pravastatin (PRAVACHOL) 20 MG tablet Take 1 tablet (20 mg total) by mouth daily. 90 tablet 3  . tamsulosin (FLOMAX) 0.4 MG CAPS capsule TAKE 2 CAPSULES BY MOUTH ONCE DAILY 60 capsule PRN  . traZODone (DESYREL) 50 MG tablet TAKE 1 TABLET BY MOUTH AT BEDTIME  30 tablet 1  . VIAGRA 100 MG tablet TAKE 1/2-1 TABLET BY MOUTH DAILY AS NEEDED FOR ERECTILE DYSFUNCTION 5 tablet 11   No current facility-administered medications on file prior to visit.     Review of Systems:  As per HPI- otherwise negative. No CP, fever, chills, cough, ST   Physical Examination: Vitals:   03/01/17 1147  BP: 118/82  Pulse: 87  Temp: 98 F (36.7 C)   Vitals:   03/01/17 1147  Weight: 216 lb (98 kg)  Height: 6\' 3"  (1.905 m)   Body mass index is 27 kg/m. Ideal Body Weight: Weight in (lb) to have BMI = 25: 199.6  GEN: WDWN, NAD, Non-toxic, A & O x 3, overweight, looks well HEENT: Atraumatic, Normocephalic. Neck supple. No masses, No LAD. Bilateral TM wnl, oropharynx normal.  PEERL,EOMI.   Ears and Nose: No external deformity. CV: RRR, No M/G/R. No JVD. No thrill. No extra heart sounds. PULM: CTA B, no wheezes, crackles, rhonchi. No retractions. No resp. distress. No accessory muscle use. ABD: S, NT, ND EXTR: No c/c/trace edema of both ankles NEURO Normal gait.  PSYCH: Normally interactive. Conversant. Not depressed or anxious appearing.  Calm demeanor.  He has a dime sized scaly, raised patch on his left forearm   Assessment and Plan: Essential hypertension  Mixed hyperlipidemia - Plan: Lipid panel  Rising PSA level - Plan: PSA  BPH associated with nocturia - Plan: PSA  Skin lesion of left arm - Plan: Ambulatory referral to Dermatology  Pedal edema - Plan: B Nat Peptide  Orthopnea - Plan: B Nat Peptide  Screening for deficiency anemia - Plan: CBC  Screening for diabetes mellitus - Plan: Comprehensive  metabolic panel, Hemoglobin A1c  Elevated glucose - Plan: Hemoglobin A1c  Medication monitoring encounter - Plan: CBC, Comprehensive metabolic panel  Here today to re-establish care BP is under control Referral to derm Will do basic labs to look for any other cause of pedal edema but suspect this is due to venous insuf Will plan further follow- up pending labs.   Signed Lamar Blinks, MD  Received labs Results for orders placed or performed in visit on 03/01/17  CBC  Result Value Ref Range   WBC 6.7 4.0 - 10.5 K/uL   RBC 4.21 (L) 4.22 - 5.81 Mil/uL   Platelets 189.0 150.0 - 400.0 K/uL   Hemoglobin 12.4 (L) 13.0 - 17.0 g/dL   HCT 37.2 (L) 39.0 - 52.0 %   MCV 88.4 78.0 - 100.0 fl   MCHC 33.4 30.0 - 36.0 g/dL   RDW 14.0 11.5 - 15.5 %  Comprehensive metabolic panel  Result Value Ref Range   Sodium 137 135 - 145 mEq/L   Potassium 4.1 3.5 - 5.1 mEq/L   Chloride 102 96 - 112 mEq/L   CO2 29 19 - 32 mEq/L   Glucose, Bld 93 70 - 99 mg/dL   BUN 14 6 - 23 mg/dL   Creatinine, Ser 1.02 0.40 - 1.50 mg/dL   Total Bilirubin 1.0 0.2 - 1.2 mg/dL   Alkaline Phosphatase 77 39 - 117 U/L   AST 13 0 - 37 U/L   ALT 13 0 - 53 U/L   Total Protein 6.8 6.0 - 8.3 g/dL   Albumin 4.0 3.5 - 5.2 g/dL   Calcium 9.0 8.4 - 10.5 mg/dL   GFR 76.64 >60.00 mL/min  Lipid panel  Result Value Ref Range   Cholesterol 149 0 - 200 mg/dL   Triglycerides 85.0 0.0 -  149.0 mg/dL   HDL 39.60 >39.00 mg/dL   VLDL 17.0 0.0 - 40.0 mg/dL   LDL Cholesterol 93 0 - 99 mg/dL   Total CHOL/HDL Ratio 4    NonHDL 109.69   Hemoglobin A1c  Result Value Ref Range   Hgb A1c MFr Bld 5.5 4.6 - 6.5 %  PSA  Result Value Ref Range   PSA 1.39 0.10 - 4.00 ng/mL  B Nat Peptide  Result Value Ref Range   Pro B Natriuretic peptide (BNP) 59.0 0.0 - 100.0 pg/mL   Lab Results  Component Value Date   PSA 1.39 03/01/2017   PSA 1.75 01/05/2016   PSA 2.83 10/21/2014

## 2017-03-10 DIAGNOSIS — L570 Actinic keratosis: Secondary | ICD-10-CM | POA: Diagnosis not present

## 2017-03-10 DIAGNOSIS — D692 Other nonthrombocytopenic purpura: Secondary | ICD-10-CM | POA: Diagnosis not present

## 2017-03-10 DIAGNOSIS — L720 Epidermal cyst: Secondary | ICD-10-CM | POA: Diagnosis not present

## 2017-03-10 DIAGNOSIS — L821 Other seborrheic keratosis: Secondary | ICD-10-CM | POA: Diagnosis not present

## 2017-03-10 DIAGNOSIS — Z85828 Personal history of other malignant neoplasm of skin: Secondary | ICD-10-CM | POA: Diagnosis not present

## 2017-05-24 DIAGNOSIS — H43813 Vitreous degeneration, bilateral: Secondary | ICD-10-CM | POA: Diagnosis not present

## 2017-05-24 DIAGNOSIS — H02831 Dermatochalasis of right upper eyelid: Secondary | ICD-10-CM | POA: Diagnosis not present

## 2017-05-24 DIAGNOSIS — H02834 Dermatochalasis of left upper eyelid: Secondary | ICD-10-CM | POA: Diagnosis not present

## 2017-05-24 DIAGNOSIS — H2513 Age-related nuclear cataract, bilateral: Secondary | ICD-10-CM | POA: Diagnosis not present

## 2017-06-23 ENCOUNTER — Ambulatory Visit (INDEPENDENT_AMBULATORY_CARE_PROVIDER_SITE_OTHER): Payer: Medicare Other

## 2017-06-23 DIAGNOSIS — Z23 Encounter for immunization: Secondary | ICD-10-CM | POA: Diagnosis not present

## 2017-08-23 DIAGNOSIS — M1711 Unilateral primary osteoarthritis, right knee: Secondary | ICD-10-CM | POA: Diagnosis not present

## 2017-08-23 DIAGNOSIS — M1712 Unilateral primary osteoarthritis, left knee: Secondary | ICD-10-CM | POA: Diagnosis not present

## 2017-09-18 DIAGNOSIS — M545 Low back pain: Secondary | ICD-10-CM | POA: Diagnosis not present

## 2017-09-18 DIAGNOSIS — M25572 Pain in left ankle and joints of left foot: Secondary | ICD-10-CM | POA: Diagnosis not present

## 2017-09-18 DIAGNOSIS — M25571 Pain in right ankle and joints of right foot: Secondary | ICD-10-CM | POA: Diagnosis not present

## 2017-09-29 MED FILL — ACETAMINOPHEN/COD #3 TABLET: 300-30 | 3 days supply | Qty: 20 | Fill #0

## 2017-09-29 MED FILL — CLINDAMYCIN HCL 300 MG CAPS: 300 | 10 days supply | Qty: 40 | Fill #0

## 2017-12-01 ENCOUNTER — Telehealth: Payer: Self-pay | Admitting: Cardiology

## 2017-12-01 NOTE — Telephone Encounter (Signed)
New message     Pt c/o BP issue: STAT if pt c/o blurred vision, one-sided weakness or slurred speech  1. What are your last 5 BP readings? 185/102  2. Are you having any other symptoms (ex. Dizziness, headache, blurred vision, passed out)? no  3. What is your BP issue? Patient feels BP uncontrolled

## 2017-12-01 NOTE — Telephone Encounter (Signed)
Spoke with pt and he said he will reach out to [place] as they control all of his medication.  Pt appreciative for call.

## 2017-12-01 NOTE — Telephone Encounter (Signed)
Please discuss with your PCP who has seen you most recently.  Thanks Candee Furbish, MD

## 2017-12-01 NOTE — Telephone Encounter (Signed)
Pt takes Losartan 50mg  QD in the AM and checks BP later in the morning and then again in the afternoon.  BP's are consistently 160-185/80-107.  Pt denies any sx.  These BP readings are over the last week and a half.  Denies increased salt in diet.  Advised I would send message to Dr. Marlou Porch for review.

## 2017-12-04 ENCOUNTER — Encounter (HOSPITAL_COMMUNITY): Payer: Self-pay | Admitting: *Deleted

## 2017-12-04 ENCOUNTER — Other Ambulatory Visit: Payer: Self-pay

## 2017-12-04 ENCOUNTER — Emergency Department (HOSPITAL_COMMUNITY): Payer: Medicare Other

## 2017-12-04 ENCOUNTER — Emergency Department (HOSPITAL_COMMUNITY)
Admission: EM | Admit: 2017-12-04 | Discharge: 2017-12-04 | Disposition: A | Discharge status: Home / Self Care | Payer: Medicare Other | Attending: Emergency Medicine | Admitting: Emergency Medicine

## 2017-12-04 DIAGNOSIS — Z7902 Long term (current) use of antithrombotics/antiplatelets: Secondary | ICD-10-CM | POA: Diagnosis not present

## 2017-12-04 DIAGNOSIS — Z7982 Long term (current) use of aspirin: Secondary | ICD-10-CM | POA: Diagnosis not present

## 2017-12-04 DIAGNOSIS — I10 Essential (primary) hypertension: Secondary | ICD-10-CM | POA: Diagnosis not present

## 2017-12-04 DIAGNOSIS — Z79899 Other long term (current) drug therapy: Secondary | ICD-10-CM | POA: Diagnosis not present

## 2017-12-04 DIAGNOSIS — R079 Chest pain, unspecified: Secondary | ICD-10-CM | POA: Diagnosis not present

## 2017-12-04 DIAGNOSIS — R0602 Shortness of breath: Secondary | ICD-10-CM | POA: Diagnosis not present

## 2017-12-04 DIAGNOSIS — R05 Cough: Secondary | ICD-10-CM | POA: Insufficient documentation

## 2017-12-04 DIAGNOSIS — Z96642 Presence of left artificial hip joint: Secondary | ICD-10-CM | POA: Diagnosis not present

## 2017-12-04 DIAGNOSIS — Z96651 Presence of right artificial knee joint: Secondary | ICD-10-CM | POA: Diagnosis not present

## 2017-12-04 DIAGNOSIS — R0789 Other chest pain: Secondary | ICD-10-CM | POA: Diagnosis not present

## 2017-12-04 LAB — BASIC METABOLIC PANEL
Anion gap: 10 (ref 5–15)
BUN: 14 mg/dL (ref 6–20)
CO2: 24 mmol/L (ref 22–32)
Calcium: 8.9 mg/dL (ref 8.9–10.3)
Chloride: 102 mmol/L (ref 101–111)
Creatinine, Ser: 1.13 mg/dL (ref 0.61–1.24)
GFR calc Af Amer: >60 mL/min (ref >60)
GFR calc non Af Amer: >60 mL/min (ref >60)
Glucose, Bld: 108 mg/dL — ABNORMAL HIGH (ref 65–99)
Potassium: 4.3 mmol/L (ref 3.5–5.1)
Sodium: 136 mmol/L (ref 135–145)

## 2017-12-04 LAB — CBC
HCT: 40.6 % (ref 39.0–52.0)
Hemoglobin: 13.4 g/dL (ref 13.0–17.0)
MCH: 30 pg (ref 26.0–34.0)
MCHC: 33 g/dL (ref 30.0–36.0)
MCV: 90.8 fL (ref 78.0–100.0)
Platelets: 179 10*3/uL (ref 150–400)
RBC: 4.47 MIL/uL (ref 4.22–5.81)
RDW: 14 % (ref 11.5–15.5)
WBC: 6.1 10*3/uL (ref 4.0–10.5)

## 2017-12-04 LAB — TROPONIN I
Troponin I: <0.03 ng/mL (ref <0.03)
Troponin I: <0.03 ng/mL (ref <0.03)

## 2017-12-04 NOTE — ED Provider Notes (Signed)
[place] [facility] EMERGENCY DEPARTMENT Provider Note   CSN: [id number] Arrival date & time: 12/04/17  6967     History   Chief Complaint Chief Complaint  Patient presents with  . Chest Pain    HPI Chris Welch is a 72 y.o. male with a history of GERD, hypertension, and hyperlipidemia who presents the emergency department today complaining of chest pain that started this morning at 0330.  Patient states that he is a light sleeper, does not believe the pain specifically woke him from sleep.  States that he is having a "tightness" discomfort to the left side of his chest.  Initially rated this a 7 out of 10 in severity, states the pain has improved to a 4 out of 10 at present.  States this morning he took aspirin as well as his prescribed blood pressure medication.  Pain had no specific alleviating or aggravating factors.  Triage noted some shortness of breath-patient denies shortness of breath to me.  States he has had some dry cough recently.  Denies nausea, vomiting, diaphoresis, dyspnea, leg pain/swelling, hemoptysis, recent surgery/trauma, recent long travel, hormone use, or personal hx of DVT/PE.  He does have a history of a malignant carotid body tumor which was excised a few years ago, no active treatment.  Patient is currently being followed at the [facility] for possible multiple myeloma-plan for biopsy in 2 days.   HPI  Past Medical History:  Diagnosis Date  . Arthritis    "hips, knees" (01/27/2016)  . Depression   . Diverticulitis   . Diverticulosis   . Ear infection ear infection lasting past 2 months, still ongoing, pt on antibiotics & drops. pt states "ear infection has eaten holes through bilateral ear drums,  I am hard of hearing"  . Enlarged prostate   . Esophageal stricture   . Gastropathy    reactive  . GERD (gastroesophageal reflux disease)   . Hard of hearing   . Hyperlipidemia   . Hypertension   . Malignant carotid body tumor ([place])   . Pneumonia      x 3, last 2009 (01/27/2016)  . 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 Vocal cord paralysis    Right    Patient Active Problem List   Diagnosis Date Noted  . Primary osteoarthritis of left hip 01/27/2016  . Arthritis of left hip 01/27/2016  . Degenerative joint disease (DJD) of hip 01/05/2016  . Atypical chest pain 04/22/2014  . Gastric reflux with aspiration 03/04/2014  . Carpal tunnel syndrome 11/26/2013  . HTN (hypertension) 11/26/2013  . AR (allergic rhinitis) 11/26/2013  . Low sodium levels 03/13/2013  . BPH associated with nocturia 08/21/2012  . Hyperlipidemia 08/21/2012  . Recurrent aspiration bronchitis/pneumonia ([place]) 10/05/2011  . Diverticulosis 10/05/2011  . Hearing loss of both ears 10/05/2011  . Depression 10/05/2011  . 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 APPENDECTOMY    . BACK SURGERY    . CAROTID BODY TUMOR EXCISION    . CERVICAL PARAGANGLIOMA EXCISION Right 12/2006  . CHOLECYSTECTOMY OPEN    . DEEP NECK LYMPH NODE BIOPSY / EXCISION    . ESOPHAGOGASTRODUODENOSCOPY (EGD) WITH ESOPHAGEAL DILATION    . INGUINAL HERNIA REPAIR Right 1954  . JOINT REPLACEMENT    . KNEE ARTHROSCOPY Right   . LUMBAR LAMINECTOMY/DECOMPRESSION MICRODISCECTOMY Left 03/09/2013   Procedure: LUMBAR LAMINECTOMY/DECOMPRESSION MICRODISCECTOMY 1 LEVEL;  Surgeon: Eustace Moore,  MD;  Location: [facility];  Service: Neurosurgery;  Laterality: Left;  Left lumbar three-four extra-foraminal microdiscectomy  . PATELLA ARTHROPLASTY Right    has had 5 surgeries right knee  . TOTAL HIP ARTHROPLASTY Left 01/27/2016  . TOTAL HIP ARTHROPLASTY Left 01/27/2016   Procedure: TOTAL HIP ARTHROPLASTY ANTERIOR APPROACH;  Surgeon: Frederik Pear, MD;  Location: [facility];  Service: Orthopedics;  Laterality: Left;  [name] Kitchen VOCAL CORD IMPLANT         Home Medications    Prior to Admission medications   Medication Sig Start Date End  Date Taking? Authorizing Provider  albuterol (PROVENTIL HFA;VENTOLIN HFA) 108 (90 BASE) MCG/ACT inhaler Inhale 2 puffs into the lungs every 6 (six) hours as needed. 08/30/14   Darlyne Russian, MD  aspirin EC 325 MG tablet Take 1 tablet (325 mg total) by mouth 2 (two) times daily. 01/27/16   Leighton Parody, PA-C  buPROPion (WELLBUTRIN XL) 150 MG 24 hr tablet Take 3 tablets (450 mg total) by mouth daily. 07/05/16   Jeffery, Chelle, PA-C  fluticasone (FLONASE) 50 MCG/ACT nasal spray Place 2 sprays into both nostrils daily. 11/26/13   Harrison Mons, PA-C  glycopyrrolate (ROBINUL) 1 MG tablet Take 1 tablet (1 mg total) by mouth 3 (three) times daily. 05/12/16   Darlyne Russian, MD  losartan (COZAAR) 50 MG tablet TAKE 1 TABLET BY MOUTH ONCE DAILY 03/24/16   Jerline Pain, MD  meloxicam (MOBIC) 15 MG tablet Take 15 mg by mouth daily.  12/16/15   [provider]  ofloxacin (FLOXIN) 0.3 % otic solution Place 5 drops into both ears daily. 05/07/14   Darlyne Russian, MD  omeprazole (PRILOSEC) 40 MG capsule TAKE 1 CAPSULE BY MOUTH 2 TIMES DAILY. 01/05/16   Doran Stabler, MD  pravastatin (PRAVACHOL) 20 MG tablet Take 1 tablet (20 mg total) by mouth daily. 06/09/15   Jerline Pain, MD  tamsulosin (FLOMAX) 0.4 MG CAPS capsule TAKE 2 CAPSULES BY MOUTH ONCE DAILY 07/24/15   Harrison Mons, PA-C  traZODone (DESYREL) 50 MG tablet TAKE 1 TABLET BY MOUTH AT BEDTIME 08/22/14   Darlyne Russian, MD  VIAGRA 100 MG tablet TAKE 1/2-1 TABLET BY MOUTH DAILY AS NEEDED FOR ERECTILE DYSFUNCTION 10/01/13   Harrison Mons, PA-C    Family History Family History  Problem Relation Age of Onset  . Arthritis Brother        back  . Lung cancer Brother 61        (dx to death: 30 days)  . Cancer Brother        rare cancer-unknown type  . Heart disease Mother   . Emphysema Father     Social History Social History   Tobacco Use  . Smoking status: Never Smoker  . Smokeless tobacco: Never Used  Substance Use Topics  .  Alcohol use: Yes    Alcohol/week: 1.8 oz    Types: 3 Cans of beer per week  . Drug use: No     Allergies   Hydrocodone and Penicillins   Review of Systems Review of Systems  Constitutional: Negative for chills and fever.  Respiratory: Positive for cough. Negative for shortness of breath.   Cardiovascular: Positive for chest pain. Negative for leg swelling.  Gastrointestinal: Negative for nausea and vomiting.  Musculoskeletal: Negative for myalgias (legs).  Neurological: Negative for dizziness, syncope and light-headedness.  All other systems reviewed and are negative.   Physical Exam Updated Vital Signs BP (!) 143/99  Pulse (!) 103   Temp 98.4 F (36.9 C)   Resp (!) 22   Ht _0  (1.905 m)   Wt 96.2 kg (212 lb)   SpO2 97%   BMI 26.50 kg/m   Physical Exam  Constitutional: He appears well-developed and well-nourished. No distress.  HENT:  Head: Normocephalic and atraumatic.  Eyes: Conjunctivae are normal. Right eye exhibits no discharge. Left eye exhibits no discharge.  Cardiovascular: Normal rate and regular rhythm.  No murmur heard. Pulses:      Radial pulses are 2+ on the right side, and 2+ on the left side.  Pulmonary/Chest: Breath sounds normal. No respiratory distress. He has no wheezes. He has no rales.  Abdominal: Soft. He exhibits no distension. There is no tenderness.  Musculoskeletal:       Right lower leg: He exhibits no tenderness and no edema.       Left lower leg: He exhibits no tenderness and no edema.  Neurological: He is alert.  Clear speech.   Skin: Skin is warm and dry. No rash noted.  Psychiatric: He has a normal mood and affect. His behavior is normal.  Nursing note and vitals reviewed.   ED Treatments / Results  Labs Results for orders placed or performed during the hospital encounter of 90/30/09  Basic metabolic panel  Result Value Ref Range   Sodium 136 135 - 145 mmol/L   Potassium 4.3 3.5 - 5.1 mmol/L   Chloride 102 101 - 111  mmol/L   CO2 24 22 - 32 mmol/L   Glucose, Bld 108 (H) 65 - 99 mg/dL   BUN 14 6 - 20 mg/dL   Creatinine, Ser 1.13 0.61 - 1.24 mg/dL   Calcium 8.9 8.9 - 10.3 mg/dL   GFR calc non Af Amer >60 >60 mL/min   GFR calc Af Amer >60 >60 mL/min   Anion gap 10 5 - 15  CBC  Result Value Ref Range   WBC 6.1 4.0 - 10.5 K/uL   RBC 4.47 4.22 - 5.81 MIL/uL   Hemoglobin 13.4 13.0 - 17.0 g/dL   HCT 40.6 39.0 - 52.0 %   MCV 90.8 78.0 - 100.0 fL   MCH 30.0 26.0 - 34.0 pg   MCHC 33.0 30.0 - 36.0 g/dL   RDW 14.0 11.5 - 15.5 %   Platelets 179 150 - 400 K/uL  Troponin I  Result Value Ref Range   Troponin I <0.03 <0.03 ng/mL  Troponin I  Result Value Ref Range   Troponin I <0.03 <0.03 ng/mL   EKG   EKG Interpretation  Date/Time:  Monday December 04 2017 06:42:00 EDT Ventricular Rate:  97 PR Interval:  184 QRS Duration: 66 QT Interval:  342 QTC Calculation: 434 R Axis:   54 Text Interpretation:  Normal sinus rhythm Anteroseptal infarct , age undetermined Abnormal ECG No significant change was found Confirmed by Jola Schmidt [phone]) on 12/04/2017 9:49:25 AM       Radiology Dg Chest 2 View  Result Date: 12/04/2017 CLINICAL DATA:  Chest pain and shortness of breath. EXAM: CHEST - 2 VIEW COMPARISON:  01/15/2016 FINDINGS: The cardiac silhouette, mediastinal and hilar contours are within normal limits and stable. The lungs are clear. No pleural effusion. The bony thorax is intact. IMPRESSION: Normal chest x-ray. Electronically Signed   By: Marijo Sanes M.D.   On: 12/04/2017 07:17    Procedures Procedures (including critical care time)  Medications Ordered in ED Medications - No data to display  Initial Impression / Assessment and Plan / ED Course  I have reviewed the triage vital signs and the nursing notes.  Pertinent labs & imaging results that were available during my care of the patient were reviewed by me and considered in my medical decision making (see chart for details).   Patient  presents with complaint of chest pain that is improved at present. Patient is nontoxic appearing, in no apparent distress, initial mild tachycardia normalized, patient with persistently elevated blood pressure- no indication of HTN emergency- patient aware of need for recheck. Work-up initiated including EKG, CXR, and lab work.   Lab work grossly unremarkable. Of note there is no leukocytosis, anemia, or significant electrolyte abnormality. CXR negative. EKG and repeat EKG without significant change from previous, troponin negative x 2, doubt ACS. He has no leg pain/swelling, hemoptysis, recent surgery/trauma, recent long travel, hormone use, or personal hx of DVT/PE, doubt pulmonary embolism. No widening of mediastinum on CXR, 2+ symmetric pulses, pain is not a tearing sensation- doubt dissection.   Unclear definitive etiology of patient's pain at this time. Will discharge patient home with cardiology follow up. I discussed results, treatment plan, need for cardiology follow-up, and return precautions with the patient and his wife. Provided opportunity for questions, patient and his wife confirmed understanding and are in agreement with plan.   Findings and plan of care discussed with supervising physician Dr. Venora Maples who personally evaluated this patient and is in agreement with plan.   Final Clinical Impressions(s) / ED Diagnoses   Final diagnoses:  Chest pain, unspecified type    ED Discharge Orders    None       Leafy Kindle 12/04/17 1515    Jola Schmidt, MD 12/04/17 1539

## 2017-12-04 NOTE — ED Triage Notes (Signed)
The pt was not feeling well yesterday. He did not sleep well all night.  Around 0500 he has some chest pressure and some sob.  His symptoms are still present  He had 324mg  aspirin this am

## 2017-12-04 NOTE — ED Notes (Signed)
[facility] possible [facility]oma   Supposed to go for a biopsy soon

## 2017-12-04 NOTE — Discharge Instructions (Signed)
You were seen in the emergency department today for chest pain. The enzyme we used to check your heart and your EKG both looked good and reassuring. There were no significant electrolyte abnormalities, anemia, or signs of infection. Your chest x-ray did not show any abnormalities.   We are unsure of the exact cause of your chest pain. With this being said it is important to follow up with your cardiologist as well as your primary care doctor for re-evaluation within 1 week. Return to the emergency department for any new or worsening symptoms including, but not limited to worsening pain, difficulty breathing, lightheadedness, passing out, or any other concerns that you may have.

## 2017-12-04 NOTE — ED Notes (Signed)
ED Provider at bedside. 

## 2017-12-20 DIAGNOSIS — D692 Other nonthrombocytopenic purpura: Secondary | ICD-10-CM | POA: Diagnosis not present

## 2017-12-20 DIAGNOSIS — L570 Actinic keratosis: Secondary | ICD-10-CM | POA: Diagnosis not present

## 2017-12-20 DIAGNOSIS — Z85828 Personal history of other malignant neoplasm of skin: Secondary | ICD-10-CM | POA: Diagnosis not present

## 2017-12-20 DIAGNOSIS — L821 Other seborrheic keratosis: Secondary | ICD-10-CM | POA: Diagnosis not present

## 2017-12-22 ENCOUNTER — Encounter: Payer: Self-pay | Admitting: Cardiology

## 2017-12-22 ENCOUNTER — Ambulatory Visit (INDEPENDENT_AMBULATORY_CARE_PROVIDER_SITE_OTHER): Payer: Medicare Other | Admitting: Cardiology

## 2017-12-22 VITALS — BP 102/64 | HR 71 | Ht 75.0 in | Wt 220.6 lb

## 2017-12-22 DIAGNOSIS — I10 Essential (primary) hypertension: Secondary | ICD-10-CM

## 2017-12-22 DIAGNOSIS — E7800 Pure hypercholesterolemia, unspecified: Secondary | ICD-10-CM | POA: Diagnosis not present

## 2017-12-22 NOTE — Patient Instructions (Signed)
Medication Instructions:  Your physician recommends that you continue on your current medications as directed. Please refer to the Current Medication list given to you today.    Labwork: -None  Testing/Procedures: -None  Follow-Up: Your physician recommends that you keep your scheduled  follow-up appointment in 3 months with Truitt Merle, NP. Your physician wants you to follow-up in: 1 year with Dr.Skains.  You will receive a reminder letter in the mail two months in advance. If you don't receive a letter, please call our office to schedule the follow-up appointment.   Any Other Special Instructions Will Be Listed Below (If Applicable). If bp consistently stays above 180 please call office @ [phone].  If you take bp and it is above 180 sit down take again in 10 minutes and you may take and extra dose of (50 mg ) Losartan.    If you need a refill on your cardiac medications before your next appointment, please call your pharmacy.

## 2017-12-22 NOTE — Progress Notes (Signed)
[facility]. [address] [place], [place]  [address] Phone: [phone] Fax:  [phone]  Date:  12/22/2017   ID:  JOSETH WEIGEL, DOB [birth year], MRN [id number]  PCP:  Darreld Mclean, MD   History of Present Illness: Chris Welch is a 72 y.o. male seen originally in December of 2012 for atypical chest pain.  [name] Kitchen Episode of hypotension [place] the [place]. His blood pressure was as low as 75 systolic. He ended up stopping his HCTZ 12.5 mg and his losartan 50 mg. He drink extra Gatorade. The next day he went to his family doctor and was found to have a urinary tract infection. He is still completing the antibody. He feels better. When his blood pressure was this low, he felt as though he was about to faint. No chest pain. No shortness of breath. He did not have any change in urinary flow. No dysuria.      on my original visit with him,  had chest pain, central with stabbing in his back. Duration usually 1-69min. On vacation when he felt. Had eaten breakfast, going up in elevator and this pain came up suddenly. Hurt all the way to room. Finally stopped. 5 minutes later happened again. Had light episodes at the park. No diaphoresis. No SOB.   No early CAD family history.   HTN better control. Takes at home, 165/110 at one point. Better now. +Hyperlipidemia, non smoker.   He went under stress testing in [place] 6/15 which was low risk (same in August of 2012). No evidence of ischemia.   He states that he does have occasional GERD especially after eating hot and spicy foods. TUMS seem to help. This is nonexertional discomfort.. Dr. Olevia Perches.   12/22/17-had lengthy discussion with him about labile blood pressures.  He showed me a couple readings that were quite high in the 564P-329 systolic.  His last week of readings however have been in the normal range.  We revisited that he had had hypotension in the past.  No chest pain, neurologic sequela I, no shortness of breath.   He was just anxious about this.  Wt Readings from Last 3 Encounters:  12/22/17 220 lb 9.6 oz (100.1 kg)  12/04/17 212 lb (96.2 kg)  03/01/17 216 lb (98 kg)     Past Medical History:  Diagnosis Date  . Arthritis    "hips, knees" (01/27/2016)  . Depression   . Diverticulitis   . Diverticulosis   . Ear infection ear infection lasting past 2 months, still ongoing, pt on antibiotics & drops. pt states "ear infection has eaten holes through bilateral ear drums,  I am hard of hearing"  . Enlarged prostate   . Esophageal stricture   . Gastropathy    reactive  . GERD (gastroesophageal reflux disease)   . Hard of hearing   . Hyperlipidemia   . Hypertension   . 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 Pneumonia    x 3, last 2009 (01/27/2016)  . 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 Vocal cord paralysis    Right    Past Surgical History:  Procedure Laterality Date  . APPENDECTOMY    . BACK SURGERY    . CAROTID BODY TUMOR EXCISION    . CERVICAL PARAGANGLIOMA EXCISION Right 12/2006  . CHOLECYSTECTOMY OPEN    . DEEP NECK LYMPH NODE BIOPSY / EXCISION    . ESOPHAGOGASTRODUODENOSCOPY (EGD)  WITH ESOPHAGEAL DILATION    . INGUINAL HERNIA REPAIR Right 1954  . JOINT REPLACEMENT    . KNEE ARTHROSCOPY Right   . LUMBAR LAMINECTOMY/DECOMPRESSION MICRODISCECTOMY Left 03/09/2013   Procedure: LUMBAR LAMINECTOMY/DECOMPRESSION MICRODISCECTOMY 1 LEVEL;  Surgeon: Eustace Moore, MD;  Location: [facility];  Service: Neurosurgery;  Laterality: Left;  Left lumbar three-four extra-foraminal microdiscectomy  . PATELLA ARTHROPLASTY Right    has had 5 surgeries right knee  . TOTAL HIP ARTHROPLASTY Left 01/27/2016  . TOTAL HIP ARTHROPLASTY Left 01/27/2016   Procedure: TOTAL HIP ARTHROPLASTY ANTERIOR APPROACH;  Surgeon: Frederik Pear, MD;  Location: [facility];  Service: Orthopedics;  Laterality: Left;  [name] Kitchen VOCAL CORD IMPLANT      Current Outpatient Medications  Medication Sig Dispense  Refill  . albuterol (PROVENTIL HFA;VENTOLIN HFA) 108 (90 BASE) MCG/ACT inhaler Inhale 2 puffs into the lungs every 6 (six) hours as needed. 1 Inhaler 4  . aspirin EC 81 MG tablet Take 81 mg by mouth daily.    [name] Kitchen buPROPion (WELLBUTRIN XL) 150 MG 24 hr tablet Take 3 tablets (450 mg total) by mouth daily. 270 tablet 3  . fluticasone (FLONASE) 50 MCG/ACT nasal spray Place 2 sprays into both nostrils daily. 48 g 4  . glycopyrrolate (ROBINUL) 1 MG tablet Take 1 tablet (1 mg total) by mouth 3 (three) times daily. 270 tablet 0  . losartan (COZAAR) 50 MG tablet TAKE 1 TABLET BY MOUTH ONCE DAILY 30 tablet 5  . meloxicam (MOBIC) 15 MG tablet Take 15 mg by mouth daily.   2  . omeprazole (PRILOSEC) 40 MG capsule TAKE 1 CAPSULE BY MOUTH 2 TIMES DAILY. 60 capsule 11  . pravastatin (PRAVACHOL) 20 MG tablet Take 1 tablet (20 mg total) by mouth daily. 90 tablet 3  . prazosin (MINIPRESS) 2 MG capsule Take 2 mg by mouth at bedtime.    . traZODone (DESYREL) 50 MG tablet TAKE 1 TABLET BY MOUTH AT BEDTIME 30 tablet 1  . VIAGRA 100 MG tablet TAKE 1/2-1 TABLET BY MOUTH DAILY AS NEEDED FOR ERECTILE DYSFUNCTION 5 tablet 11   No current facility-administered medications for this visit.     Allergies:    Allergies  Allergen Reactions  . Hydrocodone Nausea And Vomiting    "can not take on an empty stomach"  . Penicillins Other (See Comments) and Rash    Ulcers on eyes    Social History:  The patient  reports that he has never smoked. He has never used smokeless tobacco. He reports that he drinks about 1.8 oz of alcohol per week. He reports that he does not use drugs.   Family History  Problem Relation Age of Onset  . Arthritis Brother        back  . Lung cancer Brother 98        (dx to death: 30 days)  . Cancer Brother        rare cancer-unknown type  . Heart disease Mother   . Emphysema Father     ROS:  Please see the history of present illness.  All other review of systems negative. PHYSICAL EXAM: VS:   BP 102/64   Pulse 71   Ht 6\' 3"  (1.905 m)   Wt 220 lb 9.6 oz (100.1 kg)   SpO2 95%   BMI 27.57 kg/m  GEN: Well nourished, well developed, in no acute distress  HEENT: normal  Neck: no JVD, carotid bruits, or masses Cardiac: RRR; no murmurs, rubs, or gallops,no edema  Respiratory:  clear to auscultation bilaterally, normal work of breathing GI: soft, nontender, nondistended, + BS MS: no deformity or atrophy  Skin: warm and dry, no rash Neuro:  Alert and Oriented x 3, Strength and sensation are intact Psych: euthymic mood, full affect   EKG:  04/22/14-normal sinus rhythm, 79, septal Q waves    Nuclear stress test 6/15 (done in [place])--low risk, no ischemia  ASSESSMENT AND PLAN:  Hypertension/hypotension -Has been labile at times.  He showed me a few blood pressure readings in the 190s over 110s.  Sometimes he gets anxious with this.  He has had no symptoms with these.  His last week and a half have been within the normal range, 100 110 for instance systolic.  I told him to take his blood pressure at different times during the day.  If he does have a very high reading greater than 712 systolic I asked him to sit at the table, wait 10 more minutes, recheck his blood pressure.  If it still high wait another 2 minutes recheck the blood pressure see where it is.  I think there tends to be some anxiety surrounding these numbers.  If his blood pressure is greater than 180 consistently after those readings, I have instructed him to take an extra 50 mg of the losartan as needed.  He will call us if he begins to take this on a more regular basis.  Once again, he has had blood pressures as low as 75 systolic on the golf course previously and we have to be careful about hypotension.  We discussed autonomic dysfunction as well.  He also noted that he was drinking some more Gatorade during these episodes.  This could have increased his salt loading and increased his hypertension.  He is no longer on HCTZ  this was stopped years ago.  He does take Minipress for his prostate and blood pressure.  Hyperlipidemia-his LDL off of medication was 176. On medication was 96. Continue with pravastatin. I would like to recheck this if not done by Dr. Everlene Farrier.  3 months with Cecille Rubin, 12 month follow up me.  Signed, Candee Furbish, MD [facility]  12/22/2017 10:27 AM

## 2018-01-08 ENCOUNTER — Encounter (INDEPENDENT_AMBULATORY_CARE_PROVIDER_SITE_OTHER): Payer: Self-pay | Admitting: Ophthalmology

## 2018-01-08 ENCOUNTER — Ambulatory Visit (INDEPENDENT_AMBULATORY_CARE_PROVIDER_SITE_OTHER): Payer: Medicare Other | Admitting: Ophthalmology

## 2018-01-08 DIAGNOSIS — H43812 Vitreous degeneration, left eye: Secondary | ICD-10-CM | POA: Diagnosis not present

## 2018-01-08 DIAGNOSIS — H25813 Combined forms of age-related cataract, bilateral: Secondary | ICD-10-CM | POA: Diagnosis not present

## 2018-01-08 DIAGNOSIS — H43813 Vitreous degeneration, bilateral: Secondary | ICD-10-CM | POA: Diagnosis not present

## 2018-01-08 DIAGNOSIS — H02834 Dermatochalasis of left upper eyelid: Secondary | ICD-10-CM | POA: Diagnosis not present

## 2018-01-08 DIAGNOSIS — H02831 Dermatochalasis of right upper eyelid: Secondary | ICD-10-CM | POA: Diagnosis not present

## 2018-01-08 DIAGNOSIS — H4312 Vitreous hemorrhage, left eye: Secondary | ICD-10-CM | POA: Diagnosis not present

## 2018-01-08 DIAGNOSIS — H3581 Retinal edema: Secondary | ICD-10-CM

## 2018-01-08 DIAGNOSIS — H2513 Age-related nuclear cataract, bilateral: Secondary | ICD-10-CM | POA: Diagnosis not present

## 2018-01-08 NOTE — Progress Notes (Signed)
[facility] Note  01/08/2018     CHIEF COMPLAINT Patient presents for Retina Evaluation   HISTORY OF PRESENT ILLNESS: Chris Welch is a 72 y.o. male who presents to the clinic today for:   HPI    Retina Evaluation    In both eyes.  This started 2 days ago.  Associated Symptoms Floaters.  Negative for Blind Spot, Glare, Shoulder/Hip pain, Jaw Claudication, Photophobia, Distortion, Fatigue, Flashes, Pain, Trauma, Fever, Weight Loss, Scalp Tenderness and Redness.  Context:  distance vision, mid-range vision and near vision.  Treatments tried include no treatments.  I, the attending physician,  performed the HPI with the patient and updated documentation appropriately.          Comments    Referral of Dr. Shirleen Schirmer for retina evaluation; poss. Vit. Hem. Patient states on Saturday (01/06/18) he noticed and bunch of bubbles and floaters os, they where constant until today , they have become less. Denies flashes,distotion and ocular pain. Denies Vit's/gtt's       Last edited by Bernarda Caffey, MD on 01/08/2018  3:28 PM. (History)    Pt states he saw Dr. Katy Fitch due to having floaters x Saturday; pt denies any flashes; Pt reports he sees Dr. Katy Fitch on a routine basis; pt denies any flashes of light, denies wavy VA; pt denies having any ocular surgeries, denies any ocular issues; Pt denies any recent trauma, denies any head injuries; Pt states he is on 81 mg ASA; pt states he has been seeing the floaters in the top of [place] and states they then appeared to be "going across the floor"; Pt endorses having "coughing spells" pt does not recall coughing at the time he began to see floaters;   Referring physician: Warden Fillers, MD [address] [place], [place] [address]  HISTORICAL INFORMATION:   Selected notes from the MEDICAL RECORD NUMBER Referred by Dr. Shirleen Schirmer for concern of VH and possible tear OS;  LEE-  Ocular Hx-  PMH- arthritis,     CURRENT  MEDICATIONS: No current outpatient medications on file. (Ophthalmic Drugs)   No current facility-administered medications for this visit.  (Ophthalmic Drugs)   Current Outpatient Medications (Other)  Medication Sig  . albuterol (PROVENTIL HFA;VENTOLIN HFA) 108 (90 BASE) MCG/ACT inhaler Inhale 2 puffs into the lungs every 6 (six) hours as needed.  [name] Kitchen aspirin EC 81 MG tablet Take 81 mg by mouth daily.  [name] Kitchen buPROPion (WELLBUTRIN XL) 150 MG 24 hr tablet Take 3 tablets (450 mg total) by mouth daily.  . fluticasone (FLONASE) 50 MCG/ACT nasal spray Place 2 sprays into both nostrils daily.  [name] Kitchen glycopyrrolate (ROBINUL) 1 MG tablet Take 1 tablet (1 mg total) by mouth 3 (three) times daily.  [name] Kitchen losartan (COZAAR) 50 MG tablet TAKE 1 TABLET BY MOUTH ONCE DAILY  . meloxicam (MOBIC) 15 MG tablet Take 15 mg by mouth daily.   [name] Kitchen omeprazole (PRILOSEC) 40 MG capsule TAKE 1 CAPSULE BY MOUTH 2 TIMES DAILY.  . pravastatin (PRAVACHOL) 20 MG tablet Take 1 tablet (20 mg total) by mouth daily.  . prazosin (MINIPRESS) 2 MG capsule Take 2 mg by mouth at bedtime.  . traZODone (DESYREL) 50 MG tablet TAKE 1 TABLET BY MOUTH AT BEDTIME  . VIAGRA 100 MG tablet TAKE 1/2-1 TABLET BY MOUTH DAILY AS NEEDED FOR ERECTILE DYSFUNCTION   No current facility-administered medications for this visit.  (Other)      REVIEW OF SYSTEMS: ROS  Positive for: Eyes   Negative for: Constitutional, Gastrointestinal, Neurological, Skin, Genitourinary, Musculoskeletal, HENT, Endocrine, Cardiovascular, Respiratory, Psychiatric, Allergic/Imm, Heme/Lymph   Last edited by Zenovia Jordan, LPN on 8/52/7782  4:23 PM. (History)       ALLERGIES Allergies  Allergen Reactions  . Hydrocodone Nausea And Vomiting    "can not take on an empty stomach"  . Penicillins Other (See Comments) and Rash    Ulcers on eyes    PAST MEDICAL HISTORY Past Medical History:  Diagnosis Date  . Arthritis    "hips, knees" (01/27/2016)  . Depression   .  Diverticulitis   . Diverticulosis   . Ear infection ear infection lasting past 2 months, still ongoing, pt on antibiotics & drops. pt states "ear infection has eaten holes through bilateral ear drums,  I am hard of hearing"  . Enlarged prostate   . Esophageal stricture   . Gastropathy    reactive  . GERD (gastroesophageal reflux disease)   . Hard of hearing   . Hyperlipidemia   . Hypertension   . 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 Pneumonia    x 3, last 2009 (01/27/2016)  . 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 Vocal cord paralysis    Right   Past Surgical History:  Procedure Laterality Date  . APPENDECTOMY    . BACK SURGERY    . CAROTID BODY TUMOR EXCISION    . CERVICAL PARAGANGLIOMA EXCISION Right 12/2006  . CHOLECYSTECTOMY OPEN    . DEEP NECK LYMPH NODE BIOPSY / EXCISION    . ESOPHAGOGASTRODUODENOSCOPY (EGD) WITH ESOPHAGEAL DILATION    . INGUINAL HERNIA REPAIR Right 1954  . JOINT REPLACEMENT    . KNEE ARTHROSCOPY Right   . LUMBAR LAMINECTOMY/DECOMPRESSION MICRODISCECTOMY Left 03/09/2013   Procedure: LUMBAR LAMINECTOMY/DECOMPRESSION MICRODISCECTOMY 1 LEVEL;  Surgeon: Eustace Moore, MD;  Location: [facility];  Service: Neurosurgery;  Laterality: Left;  Left lumbar three-four extra-foraminal microdiscectomy  . PATELLA ARTHROPLASTY Right    has had 5 surgeries right knee  . TOTAL HIP ARTHROPLASTY Left 01/27/2016  . TOTAL HIP ARTHROPLASTY Left 01/27/2016   Procedure: TOTAL HIP ARTHROPLASTY ANTERIOR APPROACH;  Surgeon: Frederik Pear, MD;  Location: [facility];  Service: Orthopedics;  Laterality: Left;  [name] Kitchen VOCAL CORD IMPLANT      FAMILY HISTORY Family History  Problem Relation Age of Onset  . Arthritis Brother        back  . Lung cancer Brother 60        (dx to death: 30 days)  . Cancer Brother        rare cancer-unknown type  . Heart disease Mother   . Emphysema Father     SOCIAL HISTORY Social History   Tobacco Use  . Smoking status:  Never Smoker  . Smokeless tobacco: Never Used  Substance Use Topics  . Alcohol use: Yes    Alcohol/week: 1.8 oz    Types: 3 Cans of beer per week  . Drug use: No         OPHTHALMIC EXAM:  Base Eye Exam    Visual Acuity (Snellen - Linear)      Right Left   Dist cc 20/20 - 20/50 -   Dist ph cc  20/30 +2   Correction:  Glasses       Tonometry (Tonopen, 2:56 PM)      Right Left   Pressure 14 12       Pupils  Dark Light Shape React APD   Right 5 4 Round Slow None   Left 5 5 Round Minimal None       Visual Fields (Counting fingers)      Left Right    Full Full       Extraocular Movement      Right Left    Full, Ortho Full, Ortho       Neuro/Psych    Oriented x3:  Yes   Mood/Affect:  Normal       Dilation    Both eyes:  1.0% Mydriacyl, 2.5% Phenylephrine @ 2:56 PM        Slit Lamp and Fundus Exam    Slit Lamp Exam      Right Left   Lids/Lashes Dermatochalasis - upper lid, Telangiectasia vessels Dermatochalasis - upper lid, Telangiectasia vessels, Ptosis   Conjunctiva/Sclera White and quiet White and quiet   Cornea Mild Arcus, otherwise clear Mild Arcus, otherwise clear   Anterior Chamber Deep and quiet Deep and quiet   Iris Round and moderately dilated to 19mm Round and dilated to 3mm   Lens 2+ Nuclear sclerosis, 2+ Cortical cataract 2+ Nuclear sclerosis, 2+ Cortical cataract   Vitreous Vitreous syneresis Vitreous syneresis, mild central vitreous Hemorrhage       Fundus Exam      Right Left   Disc Pink and Sharp Pink and Sharp   C/D Ratio 0.4 0.3   Macula Mild Retinal pigment epithelial mottling, No heme or edema Flat, No heme or edema   Vessels Mild Vascular attenuation, otherwise normal Mild Vascular attenuation, otherwise normal   Periphery Attached Attached, punctate IRH 1030 to 1100, pre-retina heme settling inferiorly at 0600        Refraction    Wearing Rx      Sphere Cylinder Axis Add   Right -0.50 +1.75 177 +2.50   Left [place] +1.00  054 +2.50   Age:  8 mos.   Type:  PAL       Manifest Refraction      Sphere Cylinder Axis Dist VA   Right -0.50 +1.75 173 20/20-   Left -0.50 +1.00 038 20/30+2          IMAGING AND PROCEDURES  Imaging and Procedures for 01/08/18  OCT, Retina - OU - Both Eyes       Right Eye Quality was good. Central Foveal Thickness: 301. Progression has no prior data. Findings include normal foveal contour, no IRF, no SRF.   Left Eye Quality was good. Central Foveal Thickness: 293. Progression has no prior data. Findings include normal foveal contour, no IRF, no SRF (Vitreous opacities en face).   Notes *Images captured and stored on drive  Diagnosis / Impression:  NFP, No IRF/SRF OU  Clinical management:  See below  Abbreviations: NFP - Normal foveal profile. CME - cystoid macular edema. PED - pigment epithelial detachment. IRF - intraretinal fluid. SRF - subretinal fluid. EZ - ellipsoid zone. ERM - epiretinal membrane. ORA - outer retinal atrophy. ORT - outer retinal tubulation. SRHM - subretinal hyper-reflective material                  ASSESSMENT/PLAN:    ICD-10-CM   1. PVD (posterior vitreous detachment), left H43.812   2. Vitreous hemorrhage of left eye (HCC) H43.12   3. Retinal edema H35.81 OCT, Retina - OU - Both Eyes  4. Combined forms of age-related cataract of both eyes H25.813     1,2. Hemorrhagic PVD OS-  -  Discussed findings and prognosis - No RT or RD on 360 scleral depressed exam - Reviewed s/s of RT/RD - Strict return precautions for any such RT/RD signs/symptoms - VH precautions reviewed -- minimize activities, keep head elevated, avoid ASA/NSAIDs/blood thinners as able - will follow very closely - F/U Friday at 0830 for repeat exam  3. No retinal edema on exam or OCT  4. 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 - discussed diagnosis and  progression - not yet visually significant - monitor for now   Ophthalmic Meds Ordered this visit:  No orders of the defined types were placed in this encounter.      Return in about 4 days (around 01/12/2018) for F/U hemorrhagic PVD OS, DFE, OCT.  There are no Patient Instructions on file for this visit.   Explained the diagnoses, plan, and follow up with the patient and they expressed understanding.  Patient expressed understanding of the importance of proper follow up care.   This document serves as a record of services personally performed by Gardiner Sleeper, MD, PhD. It was created on their behalf by Catha Brow, [place], a certified ophthalmic assistant. The creation of this record is the provider's dictation and/or activities during the visit.  Electronically signed by: Catha Brow, [place]  01/08/18 5:20 PM    Gardiner Sleeper, M.D., Ph.D. Diseases & Surgery of the Retina and [place] 01/08/18  I have reviewed the above documentation for accuracy and completeness, and I agree with the above. Gardiner Sleeper, M.D., Ph.D. 01/09/18 5:49 PM     Abbreviations: M myopia (nearsighted); A astigmatism; H hyperopia (farsighted); P presbyopia; Mrx spectacle prescription;  CTL contact lenses; OD right eye; OS left eye; OU both eyes  XT exotropia; ET esotropia; PEK punctate epithelial keratitis; PEE punctate epithelial erosions; DES dry eye syndrome; MGD meibomian gland dysfunction; ATs artificial tears; PFAT's preservative free artificial tears; [place] nuclear sclerotic cataract; PSC posterior subcapsular cataract; ERM epi-retinal membrane; PVD posterior vitreous detachment; RD retinal detachment; DM diabetes mellitus; DR diabetic retinopathy; NPDR non-proliferative diabetic retinopathy; PDR proliferative diabetic retinopathy; CSME clinically significant macular edema; DME diabetic macular edema; dbh dot blot hemorrhages; CWS cotton wool spot; POAG primary  open angle glaucoma; C/D cup-to-disc ratio; HVF humphrey visual field; GVF goldmann visual field; OCT optical coherence tomography; IOP intraocular pressure; BRVO Branch retinal vein occlusion; CRVO central retinal vein occlusion; CRAO central retinal artery occlusion; BRAO branch retinal artery occlusion; RT retinal tear; SB scleral buckle; PPV pars plana vitrectomy; VH Vitreous hemorrhage; PRP panretinal laser photocoagulation; IVK intravitreal kenalog; VMT vitreomacular traction; MH Macular hole;  NVD neovascularization of the disc; NVE neovascularization elsewhere; AREDS age related eye disease study; ARMD age related macular degeneration; POAG primary open angle glaucoma; EBMD epithelial/anterior basement membrane dystrophy; ACIOL anterior chamber intraocular lens; IOL intraocular lens; PCIOL posterior chamber intraocular lens; Phaco/IOL phacoemulsification with intraocular lens placement; [place] photorefractive keratectomy; LASIK laser assisted in situ keratomileusis; HTN hypertension; DM diabetes mellitus; COPD chronic obstructive pulmonary disease

## 2018-01-09 ENCOUNTER — Encounter (INDEPENDENT_AMBULATORY_CARE_PROVIDER_SITE_OTHER): Payer: Self-pay | Admitting: Ophthalmology

## 2018-01-11 NOTE — Progress Notes (Signed)
[facility] Note  01/12/2018     CHIEF COMPLAINT Patient presents for Retina Follow Up   HISTORY OF PRESENT ILLNESS: Chris Welch is a 72 y.o. male who presents to the clinic today for:   HPI    Retina Follow Up    Patient presents with  Retinal Break/Detachment.  In left eye.  This started 4 days ago.  Severity is mild.  Since onset it is gradually improving.  I, the attending physician,  performed the HPI with the patient and updated documentation appropriately.          Comments    F/U Hemorrhagic PVD OS. Patient states his vision is gradually improving,floaters are becoming less everyday. Denies flashes and ocular pain. Denies vit's/gtt's       Last edited by Bernarda Caffey, MD on 01/12/2018  8:44 AM. (History)       Referring physician: Darreld Mclean, MD [address] [place], [place] [address]  HISTORICAL INFORMATION:   Selected notes from the MEDICAL RECORD NUMBER Referred by Dr. Shirleen Schirmer for concern of VH and possible tear OS;  LEE-  Ocular Hx-  PMH- arthritis,     CURRENT MEDICATIONS: No current outpatient medications on file. (Ophthalmic Drugs)   No current facility-administered medications for this visit.  (Ophthalmic Drugs)   Current Outpatient Medications (Other)  Medication Sig  . albuterol (PROVENTIL HFA;VENTOLIN HFA) 108 (90 BASE) MCG/ACT inhaler Inhale 2 puffs into the lungs every 6 (six) hours as needed.  [name] Kitchen aspirin EC 81 MG tablet Take 81 mg by mouth daily.  [name] Kitchen buPROPion (WELLBUTRIN XL) 150 MG 24 hr tablet Take 3 tablets (450 mg total) by mouth daily.  . fluticasone (FLONASE) 50 MCG/ACT nasal spray Place 2 sprays into both nostrils daily.  [name] Kitchen glycopyrrolate (ROBINUL) 1 MG tablet Take 1 tablet (1 mg total) by mouth 3 (three) times daily.  [name] Kitchen losartan (COZAAR) 50 MG tablet TAKE 1 TABLET BY MOUTH ONCE DAILY  . meloxicam (MOBIC) 15 MG tablet Take 15 mg by mouth daily.   [name] Kitchen omeprazole (PRILOSEC) 40 MG  capsule TAKE 1 CAPSULE BY MOUTH 2 TIMES DAILY.  . pravastatin (PRAVACHOL) 20 MG tablet Take 1 tablet (20 mg total) by mouth daily.  . prazosin (MINIPRESS) 2 MG capsule Take 2 mg by mouth at bedtime.  . traZODone (DESYREL) 50 MG tablet TAKE 1 TABLET BY MOUTH AT BEDTIME  . VIAGRA 100 MG tablet TAKE 1/2-1 TABLET BY MOUTH DAILY AS NEEDED FOR ERECTILE DYSFUNCTION   No current facility-administered medications for this visit.  (Other)      REVIEW OF SYSTEMS: ROS    Positive for: Eyes   Negative for: Constitutional, Gastrointestinal, Neurological, Skin, Genitourinary, Musculoskeletal, HENT, Endocrine, Cardiovascular, Respiratory, Psychiatric, Allergic/Imm, Heme/Lymph   Last edited by Zenovia Jordan, LPN on 7/90/2409  7:35 AM. (History)       ALLERGIES Allergies  Allergen Reactions  . Hydrocodone Nausea And Vomiting    "can not take on an empty stomach"  . Penicillins Other (See Comments) and Rash    Ulcers on eyes    PAST MEDICAL HISTORY Past Medical History:  Diagnosis Date  . Arthritis    "hips, knees" (01/27/2016)  . Depression   . Diverticulitis   . Diverticulosis   . Ear infection ear infection lasting past 2 months, still ongoing, pt on antibiotics & drops. pt states "ear infection has eaten holes through bilateral ear drums,  I am hard of  hearing"  . Enlarged prostate   . Esophageal stricture   . Gastropathy    reactive  . GERD (gastroesophageal reflux disease)   . Hard of hearing   . Hyperlipidemia   . Hypertension   . 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 Pneumonia    x 3, last 2009 (01/27/2016)  . 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 Vocal cord paralysis    Right   Past Surgical History:  Procedure Laterality Date  . APPENDECTOMY    . BACK SURGERY    . CAROTID BODY TUMOR EXCISION    . CERVICAL PARAGANGLIOMA EXCISION Right 12/2006  . CHOLECYSTECTOMY OPEN    . DEEP NECK LYMPH NODE BIOPSY / EXCISION    .  ESOPHAGOGASTRODUODENOSCOPY (EGD) WITH ESOPHAGEAL DILATION    . INGUINAL HERNIA REPAIR Right 1954  . JOINT REPLACEMENT    . KNEE ARTHROSCOPY Right   . LUMBAR LAMINECTOMY/DECOMPRESSION MICRODISCECTOMY Left 03/09/2013   Procedure: LUMBAR LAMINECTOMY/DECOMPRESSION MICRODISCECTOMY 1 LEVEL;  Surgeon: Eustace Moore, MD;  Location: [facility];  Service: Neurosurgery;  Laterality: Left;  Left lumbar three-four extra-foraminal microdiscectomy  . PATELLA ARTHROPLASTY Right    has had 5 surgeries right knee  . TOTAL HIP ARTHROPLASTY Left 01/27/2016  . TOTAL HIP ARTHROPLASTY Left 01/27/2016   Procedure: TOTAL HIP ARTHROPLASTY ANTERIOR APPROACH;  Surgeon: Frederik Pear, MD;  Location: [facility];  Service: Orthopedics;  Laterality: Left;  [name] Kitchen VOCAL CORD IMPLANT      FAMILY HISTORY Family History  Problem Relation Age of Onset  . Arthritis Brother        back  . Lung cancer Brother 66        (dx to death: 30 days)  . Cancer Brother        rare cancer-unknown type  . Heart disease Mother   . Emphysema Father     SOCIAL HISTORY Social History   Tobacco Use  . Smoking status: Never Smoker  . Smokeless tobacco: Never Used  Substance Use Topics  . Alcohol use: Yes    Alcohol/week: 1.8 oz    Types: 3 Cans of beer per week  . Drug use: No         OPHTHALMIC EXAM:  Base Eye Exam    Visual Acuity (Snellen - Linear)      Right Left   Dist cc 20/25 +1 20/25 -2   Dist ph cc 20/20 -2 NI   Correction:  Glasses       Tonometry (Tonopen, 8:31 AM)      Right Left   Pressure 12 12       Pupils      Dark Light Shape React APD   Right 3 2.5 Round Minimal None   Left 3 2.5 Round Minimal None       Visual Fields (Counting fingers)      Left Right    Full Full       Extraocular Movement      Right Left    Full, Ortho Full, Ortho       Neuro/Psych    Oriented x3:  Yes   Mood/Affect:  Normal       Dilation    Left eye:  1.0% Mydriacyl, 2.5% Phenylephrine @ 8:31 AM        Slit Lamp  and Fundus Exam    Slit Lamp Exam      Right Left   Lids/Lashes Dermatochalasis - upper lid,  Telangiectasia vessels Dermatochalasis - upper lid, Telangiectasia vessels, Ptosis   Conjunctiva/Sclera White and quiet White and quiet   Cornea Mild Arcus, otherwise clear Mild Arcus, otherwise clear   Anterior Chamber Deep and quiet Deep and quiet   Iris Round and moderately dilated to 88mm Round and dilated to 74mm   Lens 2+ Nuclear sclerosis, 2+ Cortical cataract 2+ Nuclear sclerosis, 2+ Cortical cataract   Vitreous Vitreous syneresis Posterior vitreous detachment, Vitreous syneresis, mild central vitreous Hemorrhage -- improving and settling inferiorly        Fundus Exam      Right Left   Disc  Pink and Sharp   C/D Ratio 0.4 0.3   Macula  Flat, No heme or edema   Vessels  Mild Vascular attenuation, otherwise normal   Periphery  Attached, punctate IRH 1030 to 1100, inferior pre-retinal heme improving, dot heme at 1200          IMAGING AND PROCEDURES  Imaging and Procedures for 01/12/18           ASSESSMENT/PLAN:    ICD-10-CM   1. PVD (posterior vitreous detachment), left H43.812   2. Vitreous hemorrhage of left eye (HCC) H43.12   3. Retinal edema H35.81   4. Combined forms of age-related cataract of both eyes H25.813     1,2. Hemorrhagic PVD OS-  - Symptoms improving - No RT or RD on repeat 360 peripheral exam - Reviewed s/s of RT/RD - Strict return precautions for any such RT/RD signs/symptoms - VH precautions reviewed -- minimize activities, keep head elevated, avoid ASA/NSAIDs/blood thinners as able - F/U 3 wks  3. No retinal edema on exam or OCT  4. 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 - discussed diagnosis and progression - not yet visually significant - monitor for now   Ophthalmic Meds Ordered this visit:  No orders of the defined types were placed in this  encounter.      Return in about 3 weeks (around 02/02/2018) for Dilated Exam, OCT; f/u hemorrhagic PVD.  There are no Patient Instructions on file for this visit.   Explained the diagnoses, plan, and follow up with the patient and they expressed understanding.  Patient expressed understanding of the importance of proper follow up care.   This document serves as a record of services personally performed by Gardiner Sleeper, MD, PhD. It was created on their behalf by Catha Brow, [place], a certified ophthalmic assistant. The creation of this record is the provider's dictation and/or activities during the visit.  Electronically signed by: Catha Brow, [place]  01/12/18 9:51 AM   Gardiner Sleeper, M.D., Ph.D. Diseases & Surgery of the Retina and [place] 01/12/18  I have reviewed the above documentation for accuracy and completeness, and I agree with the above. Gardiner Sleeper, M.D., Ph.D. 01/12/18 9:51 AM    Abbreviations: M myopia (nearsighted); A astigmatism; H hyperopia (farsighted); P presbyopia; Mrx spectacle prescription;  CTL contact lenses; OD right eye; OS left eye; OU both eyes  XT exotropia; ET esotropia; PEK punctate epithelial keratitis; PEE punctate epithelial erosions; DES dry eye syndrome; MGD meibomian gland dysfunction; ATs artificial tears; PFAT's preservative free artificial tears; [place] nuclear sclerotic cataract; PSC posterior subcapsular cataract; ERM epi-retinal membrane; PVD posterior vitreous detachment; RD retinal detachment; DM diabetes mellitus; DR diabetic retinopathy; NPDR non-proliferative diabetic retinopathy; PDR proliferative diabetic retinopathy; CSME clinically significant  macular edema; DME diabetic macular edema; dbh dot blot hemorrhages; CWS cotton wool spot; POAG primary open angle glaucoma; C/D cup-to-disc ratio; HVF humphrey visual field; GVF goldmann visual field; OCT optical coherence tomography; IOP intraocular  pressure; BRVO Branch retinal vein occlusion; CRVO central retinal vein occlusion; CRAO central retinal artery occlusion; BRAO branch retinal artery occlusion; RT retinal tear; SB scleral buckle; PPV pars plana vitrectomy; VH Vitreous hemorrhage; PRP panretinal laser photocoagulation; IVK intravitreal kenalog; VMT vitreomacular traction; MH Macular hole;  NVD neovascularization of the disc; NVE neovascularization elsewhere; AREDS age related eye disease study; ARMD age related macular degeneration; POAG primary open angle glaucoma; EBMD epithelial/anterior basement membrane dystrophy; ACIOL anterior chamber intraocular lens; IOL intraocular lens; PCIOL posterior chamber intraocular lens; Phaco/IOL phacoemulsification with intraocular lens placement; [place] photorefractive keratectomy; LASIK laser assisted in situ keratomileusis; HTN hypertension; DM diabetes mellitus; COPD chronic obstructive pulmonary disease

## 2018-01-12 ENCOUNTER — Encounter (INDEPENDENT_AMBULATORY_CARE_PROVIDER_SITE_OTHER): Payer: Self-pay | Admitting: Ophthalmology

## 2018-01-12 ENCOUNTER — Ambulatory Visit (INDEPENDENT_AMBULATORY_CARE_PROVIDER_SITE_OTHER): Payer: Medicare Other | Admitting: Ophthalmology

## 2018-01-12 DIAGNOSIS — H25813 Combined forms of age-related cataract, bilateral: Secondary | ICD-10-CM | POA: Diagnosis not present

## 2018-01-12 DIAGNOSIS — H3581 Retinal edema: Secondary | ICD-10-CM

## 2018-01-12 DIAGNOSIS — H4312 Vitreous hemorrhage, left eye: Secondary | ICD-10-CM | POA: Diagnosis not present

## 2018-01-12 DIAGNOSIS — H43812 Vitreous degeneration, left eye: Secondary | ICD-10-CM

## 2018-01-21 ENCOUNTER — Encounter: Payer: Self-pay | Admitting: Gastroenterology

## 2018-02-01 NOTE — Progress Notes (Addendum)
[facility] Note  02/02/2018     CHIEF COMPLAINT Patient presents for Retina Follow Up   HISTORY OF PRESENT ILLNESS: Chris Welch is a 72 y.o. male who presents to the clinic today for:   HPI    Retina Follow Up    Patient presents with  PVD.  In left eye.  Severity is moderate.  Duration of 3 weeks.  Since onset it is stable.  I, the attending physician,  performed the HPI with the patient and updated documentation appropriately.          Comments    Pt presents for PVD OS F/U, pt states VA has been good, but he can still see a "cloud" in his left eye, pt states he also sees a few floaters OS, but no flashes, pain or wavy vision, pt denies the use of gtts,        Last edited by Bernarda Caffey, MD on 02/02/2018  9:03 AM. (History)       Referring physician: Darreld Mclean, MD [address] [place], [place] [address]  HISTORICAL INFORMATION:   Selected notes from the MEDICAL RECORD NUMBER Referred by Dr. Shirleen Schirmer for concern of VH and possible tear OS;  LEE-  Ocular Hx-  PMH- arthritis,     CURRENT MEDICATIONS: No current outpatient medications on file. (Ophthalmic Drugs)   No current facility-administered medications for this visit.  (Ophthalmic Drugs)   Current Outpatient Medications (Other)  Medication Sig  . albuterol (PROVENTIL HFA;VENTOLIN HFA) 108 (90 BASE) MCG/ACT inhaler Inhale 2 puffs into the lungs every 6 (six) hours as needed.  [name] Kitchen aspirin EC 81 MG tablet Take 81 mg by mouth daily.  [name] Kitchen buPROPion (WELLBUTRIN XL) 150 MG 24 hr tablet Take 3 tablets (450 mg total) by mouth daily.  . fluticasone (FLONASE) 50 MCG/ACT nasal spray Place 2 sprays into both nostrils daily.  [name] Kitchen glycopyrrolate (ROBINUL) 1 MG tablet Take 1 tablet (1 mg total) by mouth 3 (three) times daily.  [name] Kitchen losartan (COZAAR) 50 MG tablet TAKE 1 TABLET BY MOUTH ONCE DAILY  . meloxicam (MOBIC) 15 MG tablet Take 15 mg by mouth daily.   [name] Kitchen omeprazole  (PRILOSEC) 40 MG capsule TAKE 1 CAPSULE BY MOUTH 2 TIMES DAILY.  . pravastatin (PRAVACHOL) 20 MG tablet Take 1 tablet (20 mg total) by mouth daily.  . prazosin (MINIPRESS) 2 MG capsule Take 2 mg by mouth at bedtime.  . traZODone (DESYREL) 50 MG tablet TAKE 1 TABLET BY MOUTH AT BEDTIME  . VIAGRA 100 MG tablet TAKE 1/2-1 TABLET BY MOUTH DAILY AS NEEDED FOR ERECTILE DYSFUNCTION   No current facility-administered medications for this visit.  (Other)      REVIEW OF SYSTEMS: ROS    Positive for: Cardiovascular, Eyes   Negative for: Constitutional, Gastrointestinal, Neurological, Skin, Genitourinary, Musculoskeletal, HENT, Endocrine, Respiratory, Psychiatric, Allergic/Imm, Heme/Lymph   Last edited by Debbrah Alar, COT on 02/02/2018  8:25 AM. (History)       ALLERGIES Allergies  Allergen Reactions  . Hydrocodone Nausea And Vomiting    "can not take on an empty stomach"  . Penicillins Other (See Comments) and Rash    Ulcers on eyes    PAST MEDICAL HISTORY Past Medical History:  Diagnosis Date  . Arthritis    "hips, knees" (01/27/2016)  . Depression   . Diverticulitis   . Diverticulosis   . Ear infection ear infection lasting past 2 months, still  ongoing, pt on antibiotics & drops. pt states "ear infection has eaten holes through bilateral ear drums,  I am hard of hearing"  . Enlarged prostate   . Esophageal stricture   . Gastropathy    reactive  . GERD (gastroesophageal reflux disease)   . Hard of hearing   . Hyperlipidemia   . Hypertension   . 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 Pneumonia    x 3, last 2009 (01/27/2016)  . 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 Vocal cord paralysis    Right   Past Surgical History:  Procedure Laterality Date  . APPENDECTOMY    . BACK SURGERY    . CAROTID BODY TUMOR EXCISION    . CERVICAL PARAGANGLIOMA EXCISION Right 12/2006  . CHOLECYSTECTOMY OPEN    . DEEP NECK LYMPH NODE BIOPSY / EXCISION    .  ESOPHAGOGASTRODUODENOSCOPY (EGD) WITH ESOPHAGEAL DILATION    . INGUINAL HERNIA REPAIR Right 1954  . JOINT REPLACEMENT    . KNEE ARTHROSCOPY Right   . LUMBAR LAMINECTOMY/DECOMPRESSION MICRODISCECTOMY Left 03/09/2013   Procedure: LUMBAR LAMINECTOMY/DECOMPRESSION MICRODISCECTOMY 1 LEVEL;  Surgeon: Eustace Moore, MD;  Location: [facility];  Service: Neurosurgery;  Laterality: Left;  Left lumbar three-four extra-foraminal microdiscectomy  . PATELLA ARTHROPLASTY Right    has had 5 surgeries right knee  . TOTAL HIP ARTHROPLASTY Left 01/27/2016  . TOTAL HIP ARTHROPLASTY Left 01/27/2016   Procedure: TOTAL HIP ARTHROPLASTY ANTERIOR APPROACH;  Surgeon: Frederik Pear, MD;  Location: [facility];  Service: Orthopedics;  Laterality: Left;  [name] Kitchen VOCAL CORD IMPLANT      FAMILY HISTORY Family History  Problem Relation Age of Onset  . Arthritis Brother        back  . Lung cancer Brother 57        (dx to death: 30 days)  . Cancer Brother        rare cancer-unknown type  . Heart disease Mother   . Emphysema Father     SOCIAL HISTORY Social History   Tobacco Use  . Smoking status: Never Smoker  . Smokeless tobacco: Never Used  Substance Use Topics  . Alcohol use: Yes    Alcohol/week: 1.8 oz    Types: 3 Cans of beer per week  . Drug use: No         OPHTHALMIC EXAM:  Base Eye Exam    Visual Acuity (Snellen - Linear)      Right Left   Dist cc 20/25 -1 20/30 -1   Dist ph cc 20/20 -1 20/20 -2   Correction:  Glasses       Tonometry (Tonopen, 8:31 AM)      Right Left   Pressure 11 17       Pupils      Dark Light Shape React APD   Right 2 1 Round Sluggish None   Left 2 1 Round Sluggish None       Visual Fields      Left Right    Full Full       Extraocular Movement      Right Left    Full, Ortho Full, Ortho       Neuro/Psych    Oriented x3:  Yes   Mood/Affect:  Normal       Dilation    Left eye:  1.0% Mydriacyl, 2.5% Phenylephrine @ 8:31 AM        Slit Lamp and Fundus Exam  Slit Lamp Exam      Right Left   Lids/Lashes Dermatochalasis - upper lid, Telangiectasia vessels Dermatochalasis - upper lid, Telangiectasia vessels, Ptosis   Conjunctiva/Sclera White and quiet White and quiet   Cornea Mild Arcus, otherwise clear Mild Arcus, otherwise clear   Anterior Chamber Deep and quiet Deep and quiet   Iris Round and moderately dilated to 51mm Round and dilated to 2mm   Lens 2+ Nuclear sclerosis, 2+ Cortical cataract 2+ Nuclear sclerosis, 2+ Cortical cataract   Vitreous Vitreous syneresis Posterior vitreous detachment, Vitreous syneresis, mild VH improving and settling inferiorly        Fundus Exam      Right Left   Disc  Pink and Sharp   C/D Ratio 0.4 0.3   Macula  Flat, No heme or edema   Vessels  Mild Vascular attenuation, otherwise normal   Periphery  Attached, inferior pre-retinal heme resolved          IMAGING AND PROCEDURES  Imaging and Procedures for 01/12/18  OCT, Retina - OU - Both Eyes       Right Eye Quality was good. Central Foveal Thickness: 289. Progression has been stable. Findings include normal foveal contour, no IRF, no SRF.   Left Eye Quality was good. Central Foveal Thickness: 283. Progression has been stable. Findings include normal foveal contour, no IRF, no SRF (Vitreous opacities en face improved / clearing).   Notes *Images captured and stored on drive  Diagnosis / Impression:  NFP, No IRF/SRF OU  Clinical management:  See below  Abbreviations: NFP - Normal foveal profile. CME - cystoid macular edema. PED - pigment epithelial detachment. IRF - intraretinal fluid. SRF - subretinal fluid. EZ - ellipsoid zone. ERM - epiretinal membrane. ORA - outer retinal atrophy. ORT - outer retinal tubulation. SRHM - subretinal hyper-reflective material                  ASSESSMENT/PLAN:    ICD-10-CM   1. PVD (posterior vitreous detachment), left H43.812   2. Vitreous hemorrhage of left eye (HCC) H43.12   3. Retinal edema  H35.81 OCT, Retina - OU - Both Eyes  4. Combined forms of age-related cataract of both eyes H25.813     1,2. Hemorrhagic PVD OS-  - Symptoms improving --  - VH clearing and settling inferiorly -- no central VH - No RT or RD on repeat 360 peripheral exam - Reviewed s/s of RT/RD - Strict return precautions for any such RT/RD signs/symptoms - VH precautions reviewed -- minimize activities, keep head elevated, avoid ASA/NSAIDs/blood thinners as able - F/U 6 wks  3. No retinal edema on exam or OCT  4. 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 - discussed diagnosis and progression - not yet visually significant - monitor for now   Ophthalmic Meds Ordered this visit:  No orders of the defined types were placed in this encounter.      Return in about 6 weeks (around 03/16/2018) for f/u hemorrhagic PVD; Dilated Exam, OCT.  There are no Patient Instructions on file for this visit.   Explained the diagnoses, plan, and follow up with the patient and they expressed understanding.  Patient expressed understanding of the importance of proper follow up care.   This document serves as a record of services personally performed by Gardiner Sleeper, MD, PhD. It was created on their behalf by Catha Brow, [place], a  certified ophthalmic assistant. The creation of this record is the provider's dictation and/or activities during the visit.  Electronically signed by: Catha Brow, [place]  05.16.19 9:14 AM    Gardiner Sleeper, M.D., Ph.D. Diseases & Surgery of the Retina and [place] 02/02/18  I have reviewed the above documentation for accuracy and completeness, and I agree with the above. Gardiner Sleeper, M.D., Ph.D. 02/02/18 9:14 AM    Abbreviations: M myopia (nearsighted); A astigmatism; H hyperopia (farsighted); P presbyopia; Mrx spectacle prescription;  CTL  contact lenses; OD right eye; OS left eye; OU both eyes  XT exotropia; ET esotropia; PEK punctate epithelial keratitis; PEE punctate epithelial erosions; DES dry eye syndrome; MGD meibomian gland dysfunction; ATs artificial tears; PFAT's preservative free artificial tears; [place] nuclear sclerotic cataract; PSC posterior subcapsular cataract; ERM epi-retinal membrane; PVD posterior vitreous detachment; RD retinal detachment; DM diabetes mellitus; DR diabetic retinopathy; NPDR non-proliferative diabetic retinopathy; PDR proliferative diabetic retinopathy; CSME clinically significant macular edema; DME diabetic macular edema; dbh dot blot hemorrhages; CWS cotton wool spot; POAG primary open angle glaucoma; C/D cup-to-disc ratio; HVF humphrey visual field; GVF goldmann visual field; OCT optical coherence tomography; IOP intraocular pressure; BRVO Branch retinal vein occlusion; CRVO central retinal vein occlusion; CRAO central retinal artery occlusion; BRAO branch retinal artery occlusion; RT retinal tear; SB scleral buckle; PPV pars plana vitrectomy; VH Vitreous hemorrhage; PRP panretinal laser photocoagulation; IVK intravitreal kenalog; VMT vitreomacular traction; MH Macular hole;  NVD neovascularization of the disc; NVE neovascularization elsewhere; AREDS age related eye disease study; ARMD age related macular degeneration; POAG primary open angle glaucoma; EBMD epithelial/anterior basement membrane dystrophy; ACIOL anterior chamber intraocular lens; IOL intraocular lens; PCIOL posterior chamber intraocular lens; Phaco/IOL phacoemulsification with intraocular lens placement; [place] photorefractive keratectomy; LASIK laser assisted in situ keratomileusis; HTN hypertension; DM diabetes mellitus; COPD chronic obstructive pulmonary disease

## 2018-02-02 ENCOUNTER — Ambulatory Visit (INDEPENDENT_AMBULATORY_CARE_PROVIDER_SITE_OTHER): Payer: Medicare Other | Admitting: Ophthalmology

## 2018-02-02 ENCOUNTER — Encounter (INDEPENDENT_AMBULATORY_CARE_PROVIDER_SITE_OTHER): Payer: Self-pay | Admitting: Ophthalmology

## 2018-02-02 DIAGNOSIS — H43812 Vitreous degeneration, left eye: Secondary | ICD-10-CM | POA: Diagnosis not present

## 2018-02-02 DIAGNOSIS — H4312 Vitreous hemorrhage, left eye: Secondary | ICD-10-CM

## 2018-02-02 DIAGNOSIS — H3581 Retinal edema: Secondary | ICD-10-CM

## 2018-02-02 DIAGNOSIS — H25813 Combined forms of age-related cataract, bilateral: Secondary | ICD-10-CM

## 2018-03-27 NOTE — Progress Notes (Addendum)
[facility] Note  03/30/2018     CHIEF COMPLAINT Patient presents for Retina Follow Up   HISTORY OF PRESENT ILLNESS: Chris Welch is a 72 y.o. male who presents to the clinic today for:   HPI    Retina Follow Up    Patient presents with  PVD.  In left eye.  Severity is moderate.  Duration of 7 weeks.  Since onset it is stable.  I, the attending physician,  performed the HPI with the patient and updated documentation appropriately.          Comments    Pt presents for PVD OS f/u, pt states VA has been pretty good, pt states floaters are still there, but denies flashes, pain or wavy vision, pt denies the use of gtts       Last edited by Bernarda Caffey, MD on 03/30/2018  8:52 AM. (History)       Referring physician: Darreld Mclean, MD [address] [place], [place] [address]  HISTORICAL INFORMATION:   Selected notes from the MEDICAL RECORD NUMBER Referred by Dr. Shirleen Schirmer for concern of VH and possible tear OS;  LEE-  Ocular Hx-  PMH- arthritis,     CURRENT MEDICATIONS: No current outpatient medications on file. (Ophthalmic Drugs)   No current facility-administered medications for this visit.  (Ophthalmic Drugs)   Current Outpatient Medications (Other)  Medication Sig  . albuterol (PROVENTIL HFA;VENTOLIN HFA) 108 (90 BASE) MCG/ACT inhaler Inhale 2 puffs into the lungs every 6 (six) hours as needed.  [name] Kitchen aspirin EC 81 MG tablet Take 81 mg by mouth daily.  [name] Kitchen buPROPion (WELLBUTRIN XL) 150 MG 24 hr tablet Take 3 tablets (450 mg total) by mouth daily.  . fluticasone (FLONASE) 50 MCG/ACT nasal spray Place 2 sprays into both nostrils daily.  [name] Kitchen glycopyrrolate (ROBINUL) 1 MG tablet Take 1 tablet (1 mg total) by mouth 3 (three) times daily.  [name] Kitchen losartan (COZAAR) 50 MG tablet TAKE 1 TABLET BY MOUTH ONCE DAILY  . meloxicam (MOBIC) 15 MG tablet Take 15 mg by mouth daily.   [name] Kitchen omeprazole (PRILOSEC) 40 MG capsule TAKE 1 CAPSULE BY  MOUTH 2 TIMES DAILY.  . pravastatin (PRAVACHOL) 20 MG tablet Take 1 tablet (20 mg total) by mouth daily.  . prazosin (MINIPRESS) 2 MG capsule Take 2 mg by mouth at bedtime.  . traZODone (DESYREL) 50 MG tablet TAKE 1 TABLET BY MOUTH AT BEDTIME  . VIAGRA 100 MG tablet TAKE 1/2-1 TABLET BY MOUTH DAILY AS NEEDED FOR ERECTILE DYSFUNCTION   No current facility-administered medications for this visit.  (Other)      REVIEW OF SYSTEMS: ROS    Positive for: Musculoskeletal, Eyes   Negative for: Constitutional, Gastrointestinal, Neurological, Skin, Genitourinary, HENT, Endocrine, Cardiovascular, Respiratory, Psychiatric, Allergic/Imm, Heme/Lymph   Last edited by Debbrah Alar, COT on 03/30/2018  8:32 AM. (History)       ALLERGIES Allergies  Allergen Reactions  . Hydrocodone Nausea And Vomiting    "can not take on an empty stomach"  . Penicillins Other (See Comments) and Rash    Ulcers on eyes    PAST MEDICAL HISTORY Past Medical History:  Diagnosis Date  . Arthritis    "hips, knees" (01/27/2016)  . Depression   . Diverticulitis   . Diverticulosis   . Ear infection ear infection lasting past 2 months, still ongoing, pt on antibiotics & drops. pt states "ear infection has eaten holes through  bilateral ear drums,  I am hard of hearing"  . Enlarged prostate   . Esophageal stricture   . Gastropathy    reactive  . GERD (gastroesophageal reflux disease)   . Hard of hearing   . Hyperlipidemia   . Hypertension   . Malignant carotid body tumor ([facility] [place])   . Pneumonia    x 3, last 2009 (01/27/2016)  . 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 Vocal cord paralysis    Right   Past Surgical History:  Procedure Laterality Date  . APPENDECTOMY    . BACK SURGERY    . CAROTID BODY TUMOR EXCISION    . CERVICAL PARAGANGLIOMA EXCISION Right 12/2006  . CHOLECYSTECTOMY OPEN    . DEEP NECK LYMPH NODE BIOPSY / EXCISION    . ESOPHAGOGASTRODUODENOSCOPY (EGD) WITH  ESOPHAGEAL DILATION    . INGUINAL HERNIA REPAIR Right 1954  . JOINT REPLACEMENT    . KNEE ARTHROSCOPY Right   . LUMBAR LAMINECTOMY/DECOMPRESSION MICRODISCECTOMY Left 03/09/2013   Procedure: LUMBAR LAMINECTOMY/DECOMPRESSION MICRODISCECTOMY 1 LEVEL;  Surgeon: Eustace Moore, MD;  Location: [facility];  Service: Neurosurgery;  Laterality: Left;  Left lumbar three-four extra-foraminal microdiscectomy  . PATELLA ARTHROPLASTY Right    has had 5 surgeries right knee  . TOTAL HIP ARTHROPLASTY Left 01/27/2016  . TOTAL HIP ARTHROPLASTY Left 01/27/2016   Procedure: TOTAL HIP ARTHROPLASTY ANTERIOR APPROACH;  Surgeon: Frederik Pear, MD;  Location: [facility];  Service: Orthopedics;  Laterality: Left;  [name] Kitchen VOCAL CORD IMPLANT      FAMILY HISTORY Family History  Problem Relation Age of Onset  . Arthritis Brother        back  . Lung cancer Brother 61        (dx to death: 30 days)  . Cancer Brother        rare cancer-unknown type  . Heart disease Mother   . Emphysema Father     SOCIAL HISTORY Social History   Tobacco Use  . Smoking status: Never Smoker  . Smokeless tobacco: Never Used  Substance Use Topics  . Alcohol use: Yes    Alcohol/week: 1.8 oz    Types: 3 Cans of beer per week  . Drug use: No         OPHTHALMIC EXAM:  Base Eye Exam    Visual Acuity (Snellen - Linear)      Right Left   Dist cc 20/30 -1 20/25   Dist ph cc 20/20 -1 20/25   Correction:  Glasses       Tonometry (Tonopen, 8:37 AM)      Right Left   Pressure 21 15       Pupils      Dark Light Shape React APD   Right 2 1.5 Round Slow None   Left 2 1.5 Round Slow None       Visual Fields (Counting fingers)      Left Right    Full Full       Extraocular Movement      Right Left    Full, Ortho Full, Ortho       Neuro/Psych    Oriented x3:  Yes   Mood/Affect:  Normal       Dilation    Left eye:  1.0% Mydriacyl, 2.5% Phenylephrine @ 8:41 AM        Slit Lamp and Fundus Exam    Slit Lamp Exam       Right Left  Lids/Lashes Dermatochalasis - upper lid, Telangiectasia vessels Dermatochalasis - upper lid, Telangiectasia vessels, Ptosis   Conjunctiva/Sclera White and quiet White and quiet   Cornea Mild Arcus, otherwise clear Mild Arcus, otherwise clear   Anterior Chamber Deep and quiet Deep and quiet   Iris Round and moderately dilated to 36mm Round and dilated to 77mm   Lens 2+ Nuclear sclerosis, 2+ Cortical cataract 2+ Nuclear sclerosis, 2+ Cortical cataract   Vitreous Vitreous syneresis Posterior vitreous detachment, Vitreous syneresis, heme cleared, Weiss ring       Fundus Exam      Right Left   Disc  Pink and Sharp   C/D Ratio 0.4 0.3   Macula  Flat, No heme or edema   Vessels  Mild Vascular attenuation, otherwise normal   Periphery  Attached, inferior pre-retinal heme resolved          IMAGING AND PROCEDURES  Imaging and Procedures for 01/12/18  OCT, Retina - OU - Both Eyes       Right Eye Quality was good. Central Foveal Thickness: 290. Progression has been stable. Findings include normal foveal contour, no IRF, no SRF.   Left Eye Quality was good. Central Foveal Thickness: 282. Progression has been stable. Findings include normal foveal contour, no IRF, no SRF.   Notes *Images captured and stored on drive  Diagnosis / Impression:  NFP, No IRF/SRF OU  Clinical management:  See below  Abbreviations: NFP - Normal foveal profile. CME - cystoid macular edema. PED - pigment epithelial detachment. IRF - intraretinal fluid. SRF - subretinal fluid. EZ - ellipsoid zone. ERM - epiretinal membrane. ORA - outer retinal atrophy. ORT - outer retinal tubulation. SRHM - subretinal hyper-reflective material                  ASSESSMENT/PLAN:    ICD-10-CM   1. PVD (posterior vitreous detachment), left H43.812 OCT, Retina - OU - Both Eyes  2. Vitreous hemorrhage of left eye (HCC) H43.12   3. Retinal edema H35.81 OCT, Retina - OU - Both Eyes  4. Combined forms of  age-related cataract of both eyes H25.813     1,2. Hemorrhagic PVD OS-  - Symptoms improved - VH cleared -- no central VH - No RT or RD on repeat 360 peripheral exam - Reviewed s/s of RT/RD - Strict return precautions for any such RT/RD signs/symptoms - VH precautions reviewed -- minimize activities, keep head elevated, avoid ASA/NSAIDs/blood thinners as able - F/U PRN  3. No retinal edema on exam or OCT  4. 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 - discussed diagnosis and progression - not yet visually significant - monitor for now   Ophthalmic Meds Ordered this visit:  No orders of the defined types were placed in this encounter.      Return if symptoms worsen or fail to improve.  There are no Patient Instructions on file for this visit.   Explained the diagnoses, plan, and follow up with the patient and they expressed understanding.  Patient expressed understanding of the importance of proper follow up care.   This document serves as a record of services personally performed by Gardiner Sleeper, MD, PhD. It was created on their behalf by Catha Brow, [place], a certified ophthalmic assistant. The creation of this record is the provider's dictation and/or activities during the visit.  Electronically signed by: Catha Brow, [place]  07.09.19 8:10 AM  Gardiner Sleeper, M.D., Ph.D. Diseases & Surgery of the Retina and Vitreous [facility]  I have reviewed the above documentation for accuracy and completeness, and I agree with the above. Gardiner Sleeper, M.D., Ph.D. 04/02/18 8:10 AM    Abbreviations: M myopia (nearsighted); A astigmatism; H hyperopia (farsighted); P presbyopia; Mrx spectacle prescription;  CTL contact lenses; OD right eye; OS left eye; OU both eyes  XT exotropia; ET esotropia; PEK punctate epithelial keratitis; PEE punctate  epithelial erosions; DES dry eye syndrome; MGD meibomian gland dysfunction; ATs artificial tears; PFAT's preservative free artificial tears; [place] nuclear sclerotic cataract; PSC posterior subcapsular cataract; ERM epi-retinal membrane; PVD posterior vitreous detachment; RD retinal detachment; DM diabetes mellitus; DR diabetic retinopathy; NPDR non-proliferative diabetic retinopathy; PDR proliferative diabetic retinopathy; CSME clinically significant macular edema; DME diabetic macular edema; dbh dot blot hemorrhages; CWS cotton wool spot; POAG primary open angle glaucoma; C/D cup-to-disc ratio; HVF humphrey visual field; GVF goldmann visual field; OCT optical coherence tomography; IOP intraocular pressure; BRVO Branch retinal vein occlusion; CRVO central retinal vein occlusion; CRAO central retinal artery occlusion; BRAO branch retinal artery occlusion; RT retinal tear; SB scleral buckle; PPV pars plana vitrectomy; VH Vitreous hemorrhage; PRP panretinal laser photocoagulation; IVK intravitreal kenalog; VMT vitreomacular traction; MH Macular hole;  NVD neovascularization of the disc; NVE neovascularization elsewhere; AREDS age related eye disease study; ARMD age related macular degeneration; POAG primary open angle glaucoma; EBMD epithelial/anterior basement membrane dystrophy; ACIOL anterior chamber intraocular lens; IOL intraocular lens; PCIOL posterior chamber intraocular lens; Phaco/IOL phacoemulsification with intraocular lens placement; [place] photorefractive keratectomy; LASIK laser assisted in situ keratomileusis; HTN hypertension; DM diabetes mellitus; COPD chronic obstructive pulmonary disease

## 2018-03-30 ENCOUNTER — Encounter (INDEPENDENT_AMBULATORY_CARE_PROVIDER_SITE_OTHER): Payer: Self-pay | Admitting: Ophthalmology

## 2018-03-30 ENCOUNTER — Ambulatory Visit (INDEPENDENT_AMBULATORY_CARE_PROVIDER_SITE_OTHER): Payer: Medicare Other | Admitting: Ophthalmology

## 2018-03-30 DIAGNOSIS — H43812 Vitreous degeneration, left eye: Secondary | ICD-10-CM | POA: Diagnosis not present

## 2018-03-30 DIAGNOSIS — H25813 Combined forms of age-related cataract, bilateral: Secondary | ICD-10-CM

## 2018-03-30 DIAGNOSIS — H3581 Retinal edema: Secondary | ICD-10-CM

## 2018-03-30 DIAGNOSIS — H4312 Vitreous hemorrhage, left eye: Secondary | ICD-10-CM

## 2018-04-03 ENCOUNTER — Ambulatory Visit (INDEPENDENT_AMBULATORY_CARE_PROVIDER_SITE_OTHER): Payer: Medicare Other | Admitting: Nurse Practitioner

## 2018-04-03 ENCOUNTER — Encounter: Payer: Self-pay | Admitting: Nurse Practitioner

## 2018-04-03 VITALS — BP 122/78 | HR 77 | Ht 75.0 in | Wt 223.4 lb

## 2018-04-03 DIAGNOSIS — E7800 Pure hypercholesterolemia, unspecified: Secondary | ICD-10-CM | POA: Diagnosis not present

## 2018-04-03 DIAGNOSIS — I10 Essential (primary) hypertension: Secondary | ICD-10-CM | POA: Diagnosis not present

## 2018-04-03 NOTE — Progress Notes (Signed)
CARDIOLOGY OFFICE NOTE  Date:  04/03/2018    Chris Welch Date of Birth: 1946-09-18 Medical Record #[id number]  PCP:  Darreld Mclean, MD  Cardiologist:  [facility]    Chief Complaint  Patient presents with  . Hypertension    3 month check - seen for Dr. Marlou Porch    History of Present Illness: Chris Welch is a 72 y.o. male who presents today for a 3 month check. Seen for Dr. Marlou Porch.   He has a history of HTN - labile. He has had hypotension on the golf [place] in the setting of a UTI. Also with HLD. 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   Last seen in April - labile readings - some degree of anxiety but overall felt to be doing well.    Comes in today. Here alone. Just back from a 5 week trip out [place] to have had a good time. He is doing well. BP doing pretty well since last visit. Looks great here. His readings reviewed off his phone. No chest pain. Not short of breath. He feels like he is doing well. He continues to play golf. He is staying active. Labs checked by PCP and the [place]. He feels like he is doing well.   Past Medical History:  Diagnosis Date  . Arthritis    "hips, knees" (01/27/2016)  . Depression   . Diverticulitis   . Diverticulosis   . Ear infection ear infection lasting past 2 months, still ongoing, pt on antibiotics & drops. pt states "ear infection has eaten holes through bilateral ear drums,  I am hard of hearing"  . Enlarged prostate   . Esophageal stricture   . Gastropathy    reactive  . GERD (gastroesophageal reflux disease)   . Hard of hearing   . Hyperlipidemia   . Hypertension   . Malignant carotid body tumor ([place])   . Pneumonia    x 3, last 2009 (01/27/2016)  . 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 Vocal cord paralysis    Right    Past Surgical History:  Procedure Laterality Date  . APPENDECTOMY    .  BACK SURGERY    . CAROTID BODY TUMOR EXCISION    . CERVICAL PARAGANGLIOMA EXCISION Right 12/2006  . CHOLECYSTECTOMY OPEN    . DEEP NECK LYMPH NODE BIOPSY / EXCISION    . ESOPHAGOGASTRODUODENOSCOPY (EGD) WITH ESOPHAGEAL DILATION    . INGUINAL HERNIA REPAIR Right 1954  . JOINT REPLACEMENT    . KNEE ARTHROSCOPY Right   . LUMBAR LAMINECTOMY/DECOMPRESSION MICRODISCECTOMY Left 03/09/2013   Procedure: LUMBAR LAMINECTOMY/DECOMPRESSION MICRODISCECTOMY 1 LEVEL;  Surgeon: Eustace Moore, MD;  Location: [facility];  Service: Neurosurgery;  Laterality: Left;  Left lumbar three-four extra-foraminal microdiscectomy  . PATELLA ARTHROPLASTY Right    has had 5 surgeries right knee  . TOTAL HIP ARTHROPLASTY Left 01/27/2016  . TOTAL HIP ARTHROPLASTY Left 01/27/2016   Procedure: TOTAL HIP ARTHROPLASTY ANTERIOR APPROACH;  Surgeon: Frederik Pear, MD;  Location: [facility];  Service: Orthopedics;  Laterality: Left;  [name] Kitchen VOCAL CORD IMPLANT       Medications: Current Meds  Medication Sig  . albuterol (PROVENTIL HFA;VENTOLIN HFA) 108 (90 Base) MCG/ACT inhaler Inhale 2 puffs into the lungs every 6 (six) hours as needed for wheezing or shortness of breath.  . [DISCONTINUED] albuterol (  PROVENTIL HFA;VENTOLIN HFA) 108 (90 BASE) MCG/ACT inhaler Inhale 2 puffs into the lungs every 6 (six) hours as needed. (Patient taking differently: Inhale 2 puffs into the lungs every 6 (six) hours as needed for wheezing. )     Allergies: Allergies  Allergen Reactions  . Hydrocodone Nausea And Vomiting    "can not take on an empty stomach"  . Penicillins Other (See Comments) and Rash    Ulcers on eyes    Social History: The patient  reports that he has never smoked. He has never used smokeless tobacco. He reports that he drinks about 1.8 oz of alcohol per week. He reports that he does not use drugs.   Family History: The patient's family history includes Arthritis in his brother; Cancer in his brother; Emphysema in his father; Heart  disease in his mother; Lung cancer (age of onset: 71) in his brother.   Review of Systems: Please see the history of present illness.   Otherwise, the review of systems is positive for none.   All other systems are reviewed and negative.   Physical Exam: VS:  BP 122/78 (BP Location: Left Arm, Patient Position: Sitting, Cuff Size: Normal)   Pulse 77   Ht 6\' 3"  (1.905 m)   Wt 223 lb 6.4 oz (101.3 kg)   SpO2 95% Comment: at rest  BMI 27.92 kg/m  .  BMI Body mass index is 27.92 kg/m.  Wt Readings from Last 3 Encounters:  04/03/18 223 lb 6.4 oz (101.3 kg)  12/22/17 220 lb 9.6 oz (100.1 kg)  12/04/17 212 lb (96.2 kg)    General: Pleasant. Well developed, well nourished and in no acute distress.   HEENT: Normal.  Neck: Supple, no JVD, carotid bruits, or masses noted.  Cardiac: Regular rate and rhythm. No murmurs, rubs, or gallops. No edema.  Respiratory:  Lungs are clear to auscultation bilaterally with normal work of breathing.  GI: Soft and nontender.  MS: No deformity or atrophy. Gait and ROM intact.  Skin: Warm and dry. Color is normal.  Neuro:  Strength and sensation are intact and no gross focal deficits noted.  Psych: Alert, appropriate and with normal affect.   LABORATORY DATA:  EKG:  EKG is not ordered today.   Lab Results  Component Value Date   WBC 6.1 12/04/2017   HGB 13.4 12/04/2017   HCT 40.6 12/04/2017   PLT 179 12/04/2017   GLUCOSE 108 (H) 12/04/2017   CHOL 149 03/01/2017   TRIG 85.0 03/01/2017   HDL 39.60 03/01/2017   LDLCALC 93 03/01/2017   ALT 13 03/01/2017   AST 13 03/01/2017   NA 136 12/04/2017   K 4.3 12/04/2017   CL 102 12/04/2017   CREATININE 1.13 12/04/2017   BUN 14 12/04/2017   CO2 24 12/04/2017   TSH 1.67 01/05/2016   PSA 1.39 03/01/2017   INR 1.09 01/15/2016   HGBA1C 5.5 03/01/2017     BNP (last 3 results) No results for input(s): BNP in the last 8760 hours.  ProBNP (last 3 results) No results for input(s): PROBNP in the last  8760 hours.   Other Studies Reviewed Today:   Assessment/Plan:  1. Hypertension/hypotension - he has excellent control here today and at home. No changes made today. He looks to be doing well.   2. HLD - on statin - [facility].     Current medicines are reviewed with the patient today.  The patient does not have concerns regarding medicines  other than what has been noted above.  The following changes have been made:  See above.  Labs/ tests ordered today include:   No orders of the defined types were placed in this encounter.    Disposition:   FU with Dr. Marlou Porch as planned next April.    Patient is agreeable to this plan and will call if any problems develop in the interim.   SignedTruitt Merle, NP  04/03/2018 9:02 AM  [facility] [address] [place], [place]  [address] Phone: [phone] Fax: [phone]

## 2018-04-03 NOTE — Patient Instructions (Addendum)
We will be checking the following labs today - NONE   Medication Instructions:    Continue with your current medicines.     Testing/Procedures To Be Arranged:  N/A  Follow-Up:   See Dr. Marlou Porch in April of 2020    Other Special Instructions:   N/A    If you need a refill on your cardiac medications before your next appointment, please call your pharmacy.   Call the [place] office at [phone] if you have any questions, problems or concerns.

## 2018-04-14 ENCOUNTER — Other Ambulatory Visit: Payer: Self-pay | Admitting: Emergency Medicine

## 2018-04-17 ENCOUNTER — Other Ambulatory Visit: Payer: Self-pay | Admitting: Emergency Medicine

## 2018-04-19 ENCOUNTER — Encounter: Payer: Self-pay | Admitting: Family Medicine

## 2018-04-20 MED ORDER — GLYCOPYRROLATE 1 MG PO TABS: 1.0000 mg | ORAL_TABLET | Freq: Three times a day (TID) | ORAL | 3 refills | Status: AC

## 2018-04-20 MED FILL — GLYCOPYRROLATE 1 MG TABLET: 1 | 30 days supply | Qty: 90 | Fill #0

## 2018-05-22 NOTE — Progress Notes (Addendum)
[place] at [facility] [place], [place], [place] [address] [phone]  Date:  05/24/2018   Name:  Chris Welch   DOB:  1946-02-14   MRN:  [id number]  PCP:  Darreld Mclean, MD    Chief Complaint: Annual Exam (fatigue, tested for imbus-bone marrow biposy at [facility] hospital)   History of Present Illness:  Chris Welch is a 72 y.o. 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  They will see him in 6 months  He actually does get most of his refills through the [place]  Pt notes that he may feel tired just when he first gets up in the am, over the last 6 months He has always been an early riser- however he is only sleeping from about 11:30- 4pm He thinks he might be able to get to bed a bit earlier Although he feels tired when he first gets up in the am, he is not tired all day His wife notes that he does not really snore.  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  They are not sure however if this may actually be due to GERD.  His ankles are  not painful or uncomfortable He is able to pass urine ok So far today he ate toast, with a little bit of butter He had a microdiscectomy 2014, his back is doing "great" and his hip is also doing "great" following his replacement  Per recent cardiology visit in July: Assessment/Plan: 1. Hypertension/hypotension - he has excellent control here today and at home. No changes made today. He looks to be doing well.  2. HLD - on statin - [facility] and [facility]   He is also seeing ophthalmology on a regular basis- it looks like he had a posterior vitreous detachment   1,2. Hemorrhagic PVD OS-  - Symptoms improved - VH cleared -- no central VH - No RT or RD on repeat 360 peripheral exam - Reviewed s/s of RT/RD - Strict return precautions for any such RT/RD signs/symptoms - VH precautions reviewed -- minimize activities, keep head elevated, avoid ASA/NSAIDs/blood thinners as able - F/U PRN 3. No retinal edema on exam or OCT 4. 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 - discussed diagnosis and progression -  not yet visually significant - monitor for now  Labs: ordered those due- he is fasting today  Colon: UTD- due next year  Immun: UTD, mention shingrix, flu due  lossartan 50  prilosec Trazodone qhs viagra prn Robinul- this is to control excessive salivation  mobic minipres pravachol wellbutrin  asa Patient Active Problem List   Diagnosis Date Noted  . Degenerative joint disease (DJD) of hip 01/05/2016  . Atypical chest pain 04/22/2014  . Gastric reflux with aspiration 03/04/2014  . Carpal tunnel syndrome 11/26/2013  . HTN (hypertension) 11/26/2013  . AR (allergic rhinitis) 11/26/2013  . Low sodium levels 03/13/2013  . BPH associated with nocturia 08/21/2012  . Hyperlipidemia 08/21/2012  . Recurrent aspiration bronchitis/pneumonia ([place]) 10/05/2011  . Diverticulosis  10/05/2011  . Hearing loss of both ears 10/05/2011  . Depression 10/05/2011  . 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 Arthritis    "hips, knees" (01/27/2016)  . Depression   . Diverticulitis   . Diverticulosis   . Ear infection ear infection lasting past 2 months, still ongoing, pt on antibiotics & drops. pt states "ear infection has eaten holes through bilateral ear drums,  I am hard of hearing"  . Enlarged prostate   . Esophageal stricture   . Gastropathy    reactive  . GERD (gastroesophageal reflux disease)   . Hard of hearing   . Hyperlipidemia   . Hypertension   . Malignant carotid body tumor ([place])   . Pneumonia    x 3, last 2009 (01/27/2016)  . 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 Vocal cord paralysis    Right    Past Surgical History:  Procedure Laterality Date  . APPENDECTOMY    . BACK SURGERY    . CAROTID BODY TUMOR EXCISION    . CERVICAL PARAGANGLIOMA EXCISION Right 12/2006  . CHOLECYSTECTOMY OPEN    . DEEP NECK LYMPH NODE BIOPSY / EXCISION    . ESOPHAGOGASTRODUODENOSCOPY (EGD) WITH ESOPHAGEAL DILATION    . INGUINAL HERNIA REPAIR Right 1954  . JOINT REPLACEMENT    . KNEE ARTHROSCOPY Right   . LUMBAR LAMINECTOMY/DECOMPRESSION MICRODISCECTOMY Left 03/09/2013   Procedure: LUMBAR LAMINECTOMY/DECOMPRESSION MICRODISCECTOMY 1 LEVEL;  Surgeon: Eustace Moore, MD;  Location: [place] [facility];  Service: Neurosurgery;  Laterality: Left;  Left lumbar three-four extra-foraminal microdiscectomy  . PATELLA ARTHROPLASTY Right    has had 5 surgeries right knee  . TOTAL HIP ARTHROPLASTY Left 01/27/2016  . TOTAL HIP ARTHROPLASTY Left 01/27/2016   Procedure: TOTAL HIP ARTHROPLASTY ANTERIOR APPROACH;  Surgeon: Frederik Pear, MD;  Location: [facility];  Service: Orthopedics;  Laterality: Left;  [name] Kitchen VOCAL CORD IMPLANT      Social History   Tobacco Use  . Smoking status: Never Smoker  . Smokeless tobacco: Never Used   Substance Use Topics  . Alcohol use: Yes    Alcohol/week: 3.0 standard drinks    Types: 3 Cans of beer per week  . Drug use: No    Family History  Problem Relation Age of Onset  . Arthritis Brother        back  . Lung cancer Brother 63        (dx to death: 30 days)  . Cancer Brother        rare cancer-unknown type  . Heart disease Mother   . Emphysema Father     Allergies  Allergen Reactions  . Hydrocodone Nausea And  Vomiting    "can not take on an empty stomach"  . Penicillins Other (See Comments) and Rash    Ulcers on eyes    Medication list has been reviewed and updated.  Current Outpatient Medications on File Prior to Visit  Medication Sig Dispense Refill  . albuterol (PROVENTIL HFA;VENTOLIN HFA) 108 (90 Base) MCG/ACT inhaler Inhale 2 puffs into the lungs every 6 (six) hours as needed for wheezing or shortness of breath.    [name] Kitchen aspirin EC 81 MG tablet Take 81 mg by mouth daily.    [name] Kitchen buPROPion (WELLBUTRIN XL) 150 MG 24 hr tablet Take 3 tablets (450 mg total) by mouth daily. 270 tablet 3  . fluticasone (FLONASE) 50 MCG/ACT nasal spray Place 2 sprays into both nostrils daily. 48 g 4  . glycopyrrolate (ROBINUL) 1 MG tablet Take 1 tablet (1 mg total) by mouth 3 (three) times daily. 270 tablet 3  . losartan (COZAAR) 50 MG tablet TAKE 1 TABLET BY MOUTH ONCE DAILY 30 tablet 5  . meloxicam (MOBIC) 15 MG tablet Take 15 mg by mouth daily.   2  . omeprazole (PRILOSEC) 40 MG capsule TAKE 1 CAPSULE BY MOUTH 2 TIMES DAILY. 60 capsule 11  . pravastatin (PRAVACHOL) 20 MG tablet Take 1 tablet (20 mg total) by mouth daily. 90 tablet 3  . prazosin (MINIPRESS) 2 MG capsule Take 2 mg by mouth at bedtime.    . traZODone (DESYREL) 50 MG tablet TAKE 1 TABLET BY MOUTH AT BEDTIME 30 tablet 1  . VIAGRA 100 MG tablet TAKE 1/2-1 TABLET BY MOUTH DAILY AS NEEDED FOR ERECTILE DYSFUNCTION 5 tablet 11   No current facility-administered medications on file prior to visit.     Review of Systems:  As  per HPI- otherwise negative. No CP or SOB No skin changes No genital changes or concerns    Physical Examination: Vitals:   05/24/18 0907  BP: 132/88  Pulse: 72  Resp: 16  Temp: 97.6 F (36.4 C)  SpO2: 98%   Vitals:   05/24/18 0907  Weight: 225 lb (102.1 kg)  Height: 6\' 3"  (1.905 m)   Body mass index is 28.12 kg/m. Ideal Body Weight: Weight in (lb) to have BMI = 25: 199.6  GEN: WDWN, NAD, Non-toxic, A & O x 3, overweight, looks well  Mildly slurred speech which is baseline for pt  HEENT: Atraumatic, Normocephalic. Neck supple. No masses, No LAD.  Bilateral TM wnl. PEERL,EOMI.    Due to his operation he has asymmetry in her posterior oropharynx and tongue is not midline  Ears and Nose: No external deformity. CV: RRR, No M/G/R. No JVD. No thrill. No extra heart sounds. PULM: CTA B, no wheezes, crackles, rhonchi. No retractions. No resp. distress. No accessory muscle use. ABD: S, NT, ND. No rebound. No HSM. EXTR: No c/c.  Minimal edeam of both ankles which is baseline and not bothersome to him  NEURO Normal gait.  PSYCH: Normally interactive. Conversant. Not depressed or anxious appearing.  Calm demeanor.    Assessment and Plan: Essential hypertension - Plan: Comprehensive metabolic panel  Mixed hyperlipidemia - Plan: Lipid panel  BPH associated with nocturia - Plan: PSA  Need for influenza vaccination - Plan: Flu vaccine HIGH DOSE PF (Fluzone High dose)  MGUS (monoclonal gammopathy of unknown significance)  Following up today BP is under control  MGUS is being managed by the [facility] Will plan further follow- up pending labs. Flu shot given today    Signed Jessica Copland,  MD  Received his labs, message to pt  Results for orders placed or performed in visit on 05/24/18  Comprehensive metabolic panel  Result Value Ref Range   Sodium 140 135 - 145 mEq/L   Potassium 4.8 3.5 - 5.1 mEq/L   Chloride 103 96 - 112 mEq/L   CO2 32 19 - 32 mEq/L   Glucose, Bld 91 70 -  99 mg/dL   BUN 15 6 - 23 mg/dL   Creatinine, Ser 1.06 0.40 - 1.50 mg/dL   Total Bilirubin 1.0 0.2 - 1.2 mg/dL   Alkaline Phosphatase 90 39 - 117 U/L   AST 19 0 - 37 U/L   ALT 24 0 - 53 U/L   Total Protein 7.1 6.0 - 8.3 g/dL   Albumin 4.0 3.5 - 5.2 g/dL   Calcium 8.9 8.4 - 10.5 mg/dL   GFR 73.06 >60.00 mL/min  PSA  Result Value Ref Range   PSA 0.87 0.10 - 4.00 ng/mL  Lipid panel  Result Value Ref Range   Cholesterol 132 0 - 200 mg/dL   Triglycerides 65.0 0.0 - 149.0 mg/dL   HDL 45.90 >39.00 mg/dL   VLDL 13.0 0.0 - 40.0 mg/dL   LDL Cholesterol 73 0 - 99 mg/dL   Total CHOL/HDL Ratio 3    NonHDL 86.13    Your labs look good Metabolic profile is normal Cholesterol is very good- continue pravachol PSA is stable- actually lower than last year which is reassuring. Lab Results      Component                Value               Date                      PSA                      0.87                05/24/2018                PSA                      1.39                03/01/2017                PSA                      1.75                01/05/2016            Please see me in about 6 months for a recheck and take care JC

## 2018-05-24 ENCOUNTER — Encounter: Payer: Self-pay | Admitting: Family Medicine

## 2018-05-24 ENCOUNTER — Ambulatory Visit (INDEPENDENT_AMBULATORY_CARE_PROVIDER_SITE_OTHER): Payer: Medicare Other | Admitting: Family Medicine

## 2018-05-24 VITALS — BP 132/88 | HR 72 | Temp 97.6°F | Resp 16 | Ht 75.0 in | Wt 225.0 lb

## 2018-05-24 DIAGNOSIS — D472 Monoclonal gammopathy: Secondary | ICD-10-CM

## 2018-05-24 DIAGNOSIS — Z23 Encounter for immunization: Secondary | ICD-10-CM

## 2018-05-24 DIAGNOSIS — N401 Enlarged prostate with lower urinary tract symptoms: Secondary | ICD-10-CM

## 2018-05-24 DIAGNOSIS — E782 Mixed hyperlipidemia: Secondary | ICD-10-CM

## 2018-05-24 DIAGNOSIS — R351 Nocturia: Secondary | ICD-10-CM

## 2018-05-24 DIAGNOSIS — I10 Essential (primary) hypertension: Secondary | ICD-10-CM

## 2018-05-24 LAB — COMPREHENSIVE METABOLIC PANEL
ALT: 24 U/L (ref 0–53)
AST: 19 U/L (ref 0–37)
Albumin: 4 g/dL (ref 3.5–5.2)
Alkaline Phosphatase: 90 U/L (ref 39–117)
BUN: 15 mg/dL (ref 6–23)
CO2: 32 mEq/L (ref 19–32)
Calcium: 8.9 mg/dL (ref 8.4–10.5)
Chloride: 103 mEq/L (ref 96–112)
Creatinine, Ser: 1.06 mg/dL (ref 0.40–1.50)
GFR: 73.06 mL/min (ref >60.00)
Glucose, Bld: 91 mg/dL (ref 70–99)
Potassium: 4.8 mEq/L (ref 3.5–5.1)
Sodium: 140 mEq/L (ref 135–145)
Total Bilirubin: 1 mg/dL (ref 0.2–1.2)
Total Protein: 7.1 g/dL (ref 6.0–8.3)

## 2018-05-24 LAB — LIPID PANEL
Cholesterol: 132 mg/dL (ref 0–200)
HDL: 45.9 mg/dL (ref >39.00)
LDL Cholesterol: 73 mg/dL (ref 0–99)
NonHDL: 86.13
Total CHOL/HDL Ratio: 3
Triglycerides: 65 mg/dL (ref 0.0–149.0)
VLDL: 13 mg/dL (ref 0.0–40.0)

## 2018-05-24 LAB — PSA: PSA: 0.87 ng/mL (ref 0.10–4.00)

## 2018-05-24 NOTE — Patient Instructions (Addendum)
Good to see you again today!  Let me know of any medication refills that you may need You got your flu shot today Mention the shingles vaccine at your next [facility] appointment- they may be able to give it to you for a reduced cost  I will be in touch with your results asap    Health Maintenance, Male A healthy lifestyle and preventive care is important for your health and wellness. Ask your health care provider about what schedule of regular examinations is right for you. What should I know about weight and diet? Eat a Healthy Diet  Eat plenty of vegetables, fruits, whole grains, low-fat dairy products, and lean protein.  Do not eat a lot of foods high in solid fats, added sugars, or salt.  Maintain a Healthy Weight Regular exercise can help you achieve or maintain a healthy weight. You should:  Do at least 150 minutes of exercise each week. The exercise should increase your heart rate and make you sweat (moderate-intensity exercise).  Do strength-training exercises at least twice a week.  Watch Your Levels of Cholesterol and Blood Lipids  Have your blood tested for lipids and cholesterol every 5 years starting at 72 years of age. If you are at high risk for heart disease, you should start having your blood tested when you are 72 years old. You may need to have your cholesterol levels checked more often if: ? Your lipid or cholesterol levels are high. ? You are older than 72 years of age. ? You are at high risk for heart disease.  What should I know about cancer screening? Many types of cancers can be detected early and may often be prevented. Lung Cancer  You should be screened every year for lung cancer if: ? You are a current smoker who has smoked for at least 30 years. ? You are a former smoker who has quit within the past 15 years.  Talk to your health care provider about your screening options, when you should start screening, and how often you should be  screened.  Colorectal Cancer  Routine colorectal cancer screening usually begins at 72 years of age and should be repeated every 5-10 years until you are 72 years old. You may need to be screened more often if early forms of precancerous polyps or small growths are found. Your health care provider may recommend screening at an earlier age if you have risk factors for colon cancer.  Your health care provider may recommend using home test kits to check for hidden blood in the stool.  A small camera at the end of a tube can be used to examine your colon (sigmoidoscopy or colonoscopy). This checks for the earliest forms of colorectal cancer.  Prostate and Testicular Cancer  Depending on your age and overall health, your health care provider may do certain tests to screen for prostate and testicular cancer.  Talk to your health care provider about any symptoms or concerns you have about testicular or prostate cancer.  Skin Cancer  Check your skin from head to toe regularly.  Tell your health care provider about any new moles or changes in moles, especially if: ? There is a change in a mole's size, shape, or color. ? You have a mole that is larger than a pencil eraser.  Always use sunscreen. Apply sunscreen liberally and repeat throughout the day.  Protect yourself by wearing long sleeves, pants, a wide-brimmed hat, and sunglasses when outside.  What should I know  about heart disease, diabetes, and high blood pressure?  If you are 61-66 years of age, have your blood pressure checked every 3-5 years. If you are 1 years of age or older, have your blood pressure checked every year. You should have your blood pressure measured twice-once when you are at a hospital or clinic, and once when you are not at a hospital or clinic. Record the average of the two measurements. To check your blood pressure when you are not at a hospital or clinic, you can use: ? An automated blood pressure machine at a  pharmacy. ? A home blood pressure monitor.  Talk to your health care provider about your target blood pressure.  If you are between 36-42 years old, ask your health care provider if you should take aspirin to prevent heart disease.  Have regular diabetes screenings by checking your fasting blood sugar level. ? If you are at a normal weight and have a low risk for diabetes, have this test once every three years after the age of 53. ? If you are overweight and have a high risk for diabetes, consider being tested at a younger age or more often.  A one-time screening for abdominal aortic aneurysm (AAA) by ultrasound is recommended for men aged 37-75 years who are current or former smokers. What should I know about preventing infection? Hepatitis B If you have a higher risk for hepatitis B, you should be screened for this virus. Talk with your health care provider to find out if you are at risk for hepatitis B infection. Hepatitis C Blood testing is recommended for:  Everyone born from [birth year] through 1965.  Anyone with known risk factors for hepatitis C.  Sexually Transmitted Diseases (STDs)  You should be screened each year for STDs including gonorrhea and chlamydia if: ? You are sexually active and are younger than 72 years of age. ? You are older than 72 years of age and your health care provider tells you that you are at risk for this type of infection. ? Your sexual activity has changed since you were last screened and you are at an increased risk for chlamydia or gonorrhea. Ask your health care provider if you are at risk.  Talk with your health care provider about whether you are at high risk of being infected with HIV. Your health care provider may recommend a prescription medicine to help prevent HIV infection.  What else can I do?  Schedule regular health, dental, and eye exams.  Stay current with your vaccines (immunizations).  Do not use any tobacco products, such as  cigarettes, chewing tobacco, and e-cigarettes. If you need help quitting, ask your health care provider.  Limit alcohol intake to no more than 2 drinks per day. One drink equals 12 ounces of beer, 5 ounces of wine, or 1 ounces of hard liquor.  Do not use street drugs.  Do not share needles.  Ask your health care provider for help if you need support or information about quitting drugs.  Tell your health care provider if you often feel depressed.  Tell your health care provider if you have ever been abused or do not feel safe at home. This information is not intended to replace advice given to you by your health care provider. Make sure you discuss any questions you have with your health care provider. Document Released: 03/03/2008 Document Revised: 05/04/2016 Document Reviewed: 06/09/2015 Elsevier Interactive Patient Education  [employer].

## 2018-05-25 ENCOUNTER — Encounter: Payer: Self-pay | Admitting: Family Medicine

## 2018-07-18 DIAGNOSIS — D692 Other nonthrombocytopenic purpura: Secondary | ICD-10-CM | POA: Diagnosis not present

## 2018-07-18 DIAGNOSIS — D2271 Melanocytic nevi of right lower limb, including hip: Secondary | ICD-10-CM | POA: Diagnosis not present

## 2018-07-18 DIAGNOSIS — D2262 Melanocytic nevi of left upper limb, including shoulder: Secondary | ICD-10-CM | POA: Diagnosis not present

## 2018-07-18 DIAGNOSIS — L853 Xerosis cutis: Secondary | ICD-10-CM | POA: Diagnosis not present

## 2018-07-18 DIAGNOSIS — Z85828 Personal history of other malignant neoplasm of skin: Secondary | ICD-10-CM | POA: Diagnosis not present

## 2018-07-18 DIAGNOSIS — L821 Other seborrheic keratosis: Secondary | ICD-10-CM | POA: Diagnosis not present

## 2018-07-18 DIAGNOSIS — D485 Neoplasm of uncertain behavior of skin: Secondary | ICD-10-CM | POA: Diagnosis not present

## 2018-07-18 DIAGNOSIS — D225 Melanocytic nevi of trunk: Secondary | ICD-10-CM | POA: Diagnosis not present

## 2018-07-18 DIAGNOSIS — L570 Actinic keratosis: Secondary | ICD-10-CM | POA: Diagnosis not present

## 2018-08-29 DIAGNOSIS — L7682 Other postprocedural complications of skin and subcutaneous tissue: Secondary | ICD-10-CM | POA: Diagnosis not present

## 2018-08-29 DIAGNOSIS — D485 Neoplasm of uncertain behavior of skin: Secondary | ICD-10-CM | POA: Diagnosis not present

## 2018-09-10 ENCOUNTER — Telehealth: Payer: Self-pay | Admitting: *Deleted

## 2018-09-10 NOTE — Telephone Encounter (Signed)
Received Dermatopathology Report results from [facility]; forwarded to provider/SLS 12/23

## 2018-10-05 DIAGNOSIS — M1712 Unilateral primary osteoarthritis, left knee: Secondary | ICD-10-CM | POA: Diagnosis not present

## 2018-10-05 DIAGNOSIS — M1711 Unilateral primary osteoarthritis, right knee: Secondary | ICD-10-CM | POA: Diagnosis not present

## 2018-10-05 MED FILL — traMADol HCL 50 MG TABS: 50 | 8 days supply | Qty: 45 | Fill #0

## 2018-10-10 MED FILL — ACETAMINOPHEN/COD #3 TABLET: 300-30 | 4 days supply | Qty: 20 | Fill #0

## 2018-10-10 MED FILL — CLINDAMYCIN HCL 300 MG CAPS: 300 | 7 days supply | Qty: 28 | Fill #0

## 2018-11-13 NOTE — Progress Notes (Deleted)
[place] at [facility] [address], [place], [place] [address] [phone]  Date:  11/26/2018   Name:  Chris Welch   DOB:  07/27/1946   MRN:  [id number]  PCP:  Darreld Mclean, MD    Chief Complaint: No chief complaint on file.   History of Present Illness:  Chris Welch is a 73 y.o. very pleasant male patient who presents with the following:  Here today for 53-month follow-up visit I last saw him in September He has history of hypertension, hyperlipidemia, BPH.  Also, [facility] He is a patient of [place], and as of our last visit they were observing his disease He also had a benign carotid body tumor removed surgically in 2008.  This left him with some swallowing difficulty and he is prone to aspiration He uses Robinul to control excessive salivation His PSA was lower than previous on her September labs  He is now due for colonoscopy-last was in 2013 and he was given a 7-year recall Shingrix?  Albuterol as needed Aspirin 81 Wellbutrin Ropinul 1 mg 3 times daily Losartan 50 Omeprazole Pravachol Prazosin 2 mg at bedtime Trazodone 50 at bedtime Viagra as needed  Patient Active Problem List   Diagnosis Date Noted  . Degenerative joint disease (DJD) of hip 01/05/2016  . Atypical chest pain 04/22/2014  . Gastric reflux with aspiration 03/04/2014  . Carpal tunnel syndrome 11/26/2013  . HTN (hypertension) 11/26/2013  . AR (allergic rhinitis) 11/26/2013  . Low sodium levels 03/13/2013  . BPH associated with nocturia 08/21/2012  . Hyperlipidemia 08/21/2012  . Recurrent aspiration bronchitis/pneumonia ([place]) 10/05/2011  . Diverticulosis 10/05/2011  . Hearing loss of both ears 10/05/2011  . Depression 10/05/2011  . 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 Arthritis    "hips, knees" (01/27/2016)  . Depression   .  Diverticulitis   . Diverticulosis   . Ear infection ear infection lasting past 2 months, still ongoing, pt on antibiotics & drops. pt states "ear infection has eaten holes through bilateral ear drums,  I am hard of hearing"  . Enlarged prostate   . Esophageal stricture   . Gastropathy    reactive  . GERD (gastroesophageal reflux disease)   . Hard of hearing   . Hyperlipidemia   . Hypertension   . 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 Pneumonia    x 3, last 2009 (01/27/2016)  . 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 Vocal cord paralysis    Right    Past Surgical History:  Procedure Laterality Date  . APPENDECTOMY    . BACK SURGERY    . CAROTID BODY TUMOR EXCISION    . CERVICAL PARAGANGLIOMA EXCISION Right 12/2006  . CHOLECYSTECTOMY OPEN    . DEEP NECK LYMPH NODE BIOPSY / EXCISION    . ESOPHAGOGASTRODUODENOSCOPY (EGD) WITH ESOPHAGEAL DILATION    . INGUINAL HERNIA REPAIR Right 1954  . JOINT REPLACEMENT    . KNEE ARTHROSCOPY Right   . LUMBAR LAMINECTOMY/DECOMPRESSION MICRODISCECTOMY Left 03/09/2013   Procedure: LUMBAR LAMINECTOMY/DECOMPRESSION MICRODISCECTOMY 1 LEVEL;  Surgeon: Eustace Moore, MD;  Location: [place] [facility];  Service: Neurosurgery;  Laterality: Left;  Left lumbar three-four extra-foraminal microdiscectomy  . PATELLA ARTHROPLASTY Right    has had 5 surgeries right knee  . TOTAL HIP  ARTHROPLASTY Left 01/27/2016  . TOTAL HIP ARTHROPLASTY Left 01/27/2016   Procedure: TOTAL HIP ARTHROPLASTY ANTERIOR APPROACH;  Surgeon: Frederik Pear, MD;  Location: [facility];  Service: Orthopedics;  Laterality: Left;  [name] Kitchen VOCAL CORD IMPLANT      Social History   Tobacco Use  . Smoking status: Never Smoker  . Smokeless tobacco: Never Used  Substance Use Topics  . Alcohol use: Yes    Alcohol/week: 3.0 standard drinks    Types: 3 Cans of beer per week  . Drug use: No    Family History  Problem Relation Age of Onset  . Arthritis Brother        back  .  Lung cancer Brother 50        (dx to death: 30 days)  . Cancer Brother        rare cancer-unknown type  . Heart disease Mother   . Emphysema Father     Allergies  Allergen Reactions  . Hydrocodone Nausea And Vomiting    "can not take on an empty stomach"  . Penicillins Other (See Comments) and Rash    Ulcers on eyes    Medication list has been reviewed and updated.  Current Outpatient Medications on File Prior to Visit  Medication Sig Dispense Refill  . albuterol (PROVENTIL HFA;VENTOLIN HFA) 108 (90 Base) MCG/ACT inhaler Inhale 2 puffs into the lungs every 6 (six) hours as needed for wheezing or shortness of breath.    [name] Kitchen aspirin EC 81 MG tablet Take 81 mg by mouth daily.    [name] Kitchen buPROPion (WELLBUTRIN XL) 150 MG 24 hr tablet Take 3 tablets (450 mg total) by mouth daily. 270 tablet 3  . fluticasone (FLONASE) 50 MCG/ACT nasal spray Place 2 sprays into both nostrils daily. 48 g 4  . glycopyrrolate (ROBINUL) 1 MG tablet Take 1 tablet (1 mg total) by mouth 3 (three) times daily. 270 tablet 3  . losartan (COZAAR) 50 MG tablet TAKE 1 TABLET BY MOUTH ONCE DAILY 30 tablet 5  . meloxicam (MOBIC) 15 MG tablet Take 15 mg by mouth daily.   2  . omeprazole (PRILOSEC) 40 MG capsule TAKE 1 CAPSULE BY MOUTH 2 TIMES DAILY. 60 capsule 11  . pravastatin (PRAVACHOL) 20 MG tablet Take 1 tablet (20 mg total) by mouth daily. 90 tablet 3  . prazosin (MINIPRESS) 2 MG capsule Take 2 mg by mouth at bedtime.    . traZODone (DESYREL) 50 MG tablet TAKE 1 TABLET BY MOUTH AT BEDTIME 30 tablet 1  . VIAGRA 100 MG tablet TAKE 1/2-1 TABLET BY MOUTH DAILY AS NEEDED FOR ERECTILE DYSFUNCTION 5 tablet 11   No current facility-administered medications on file prior to visit.     Review of Systems:  ***  Physical Examination: There were no vitals filed for this visit. There were no vitals filed for this visit. There is no height or weight on file to calculate BMI. Ideal Body Weight:    ***  Assessment and  Plan: ***  Signed Lamar Blinks, MD

## 2018-11-22 ENCOUNTER — Ambulatory Visit: Payer: 59 | Admitting: Family Medicine

## 2018-11-26 ENCOUNTER — Ambulatory Visit: Payer: Medicare Other | Admitting: Family Medicine

## 2018-12-01 NOTE — Progress Notes (Deleted)
[place] [facility] [address] [place], [place] [address] [phone]  Date:  12/03/2018   Name:  Chris Welch   DOB:  05-20-1946   MRN:  [id number]  PCP:  Darreld Mclean, MD    Chief Complaint: No chief complaint on file.   History of Present Illness:  Chris Welch is a 73 y.o. 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) and associated aspiration pneumonia, BPH, osteoarthritis, MGUS which is managed by the [facility], hypertension, hyperlipidemia  He is followed by ophthalmology for posterior vitreous detachment Also followed by cardiology for labile hypertension Last seen by myself in September He enjoys playing golf  Colon cancer screening: Immunizations are up-to-date, but can suggest Shingrix Labs done in September, will get a BMP and CBC today PSA is up-to-date, has been stable Married to [place], who is also my patient  Wellbutrin robinul Losartan Pravachol Trazodone ?  Taking prazosin, meloxicam  Patient Active Problem List   Diagnosis Date Noted  . Degenerative joint disease (DJD) of hip 01/05/2016  . Atypical chest pain 04/22/2014  . Gastric reflux with aspiration 03/04/2014  . Carpal tunnel syndrome 11/26/2013  . HTN (hypertension) 11/26/2013  . AR (allergic rhinitis) 11/26/2013  . Low sodium levels 03/13/2013  . BPH associated with nocturia 08/21/2012  . Hyperlipidemia 08/21/2012  . Recurrent aspiration bronchitis/pneumonia ([place]) 10/05/2011  . Diverticulosis 10/05/2011  . Hearing loss of both ears 10/05/2011  . Depression 10/05/2011  . 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 Arthritis    "hips, knees" (01/27/2016)  . Depression   . Diverticulitis   . Diverticulosis   . Ear  infection ear infection lasting past 2 months, still ongoing, pt on antibiotics & drops. pt states "ear infection has eaten holes through bilateral ear drums,  I am hard of hearing"  . Enlarged prostate   . Esophageal stricture   . Gastropathy    reactive  . GERD (gastroesophageal reflux disease)   . Hard of hearing   . Hyperlipidemia   . Hypertension   . Malignant carotid body tumor ([place])   . Pneumonia    x 3, last 2009 (01/27/2016)  . 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 Vocal cord paralysis    Right    Past Surgical History:  Procedure Laterality Date  . APPENDECTOMY    . BACK SURGERY    . CAROTID BODY TUMOR EXCISION    . CERVICAL PARAGANGLIOMA EXCISION Right 12/2006  . CHOLECYSTECTOMY OPEN    . DEEP NECK LYMPH NODE BIOPSY / EXCISION    . ESOPHAGOGASTRODUODENOSCOPY (EGD) WITH ESOPHAGEAL DILATION    . INGUINAL HERNIA REPAIR Right 1954  . JOINT REPLACEMENT    . KNEE ARTHROSCOPY Right   . LUMBAR LAMINECTOMY/DECOMPRESSION MICRODISCECTOMY Left 03/09/2013   Procedure: LUMBAR LAMINECTOMY/DECOMPRESSION MICRODISCECTOMY 1 LEVEL;  Surgeon: Eustace Moore, MD;  Location: [place] [facility];  Service: Neurosurgery;  Laterality: Left;  Left lumbar three-four extra-foraminal microdiscectomy  . PATELLA ARTHROPLASTY Right    has had 5 surgeries right knee  . TOTAL HIP ARTHROPLASTY Left 01/27/2016  . TOTAL HIP ARTHROPLASTY Left 01/27/2016   Procedure: TOTAL HIP ARTHROPLASTY ANTERIOR APPROACH;  Surgeon: Frederik Pear, MD;  Location: [place];  Service: Orthopedics;  Laterality: Left;  [name] Kitchen VOCAL CORD IMPLANT      Social History   Tobacco Use  . Smoking status: Never Smoker  . Smokeless tobacco: Never Used  Substance Use Topics  . Alcohol use: Yes    Alcohol/week: 3.0 standard drinks    Types: 3 Cans of beer per week  . Drug use: No    Family History  Problem Relation Age of Onset  . Arthritis Brother        back  . Lung cancer Brother 54        (dx to death: 30  days)  . Cancer Brother        rare cancer-unknown type  . Heart disease Mother   . Emphysema Father     Allergies  Allergen Reactions  . Hydrocodone Nausea And Vomiting    "can not take on an empty stomach"  . Penicillins Other (See Comments) and Rash    Ulcers on eyes    Medication list has been reviewed and updated.  Current Outpatient Medications on File Prior to Visit  Medication Sig Dispense Refill  . albuterol (PROVENTIL HFA;VENTOLIN HFA) 108 (90 Base) MCG/ACT inhaler Inhale 2 puffs into the lungs every 6 (six) hours as needed for wheezing or shortness of breath.    [name] Kitchen aspirin EC 81 MG tablet Take 81 mg by mouth daily.    [name] Kitchen buPROPion (WELLBUTRIN XL) 150 MG 24 hr tablet Take 3 tablets (450 mg total) by mouth daily. 270 tablet 3  . fluticasone (FLONASE) 50 MCG/ACT nasal spray Place 2 sprays into both nostrils daily. 48 g 4  . glycopyrrolate (ROBINUL) 1 MG tablet Take 1 tablet (1 mg total) by mouth 3 (three) times daily. 270 tablet 3  . losartan (COZAAR) 50 MG tablet TAKE 1 TABLET BY MOUTH ONCE DAILY 30 tablet 5  . meloxicam (MOBIC) 15 MG tablet Take 15 mg by mouth daily.   2  . omeprazole (PRILOSEC) 40 MG capsule TAKE 1 CAPSULE BY MOUTH 2 TIMES DAILY. 60 capsule 11  . pravastatin (PRAVACHOL) 20 MG tablet Take 1 tablet (20 mg total) by mouth daily. 90 tablet 3  . prazosin (MINIPRESS) 2 MG capsule Take 2 mg by mouth at bedtime.    . traZODone (DESYREL) 50 MG tablet TAKE 1 TABLET BY MOUTH AT BEDTIME 30 tablet 1  . VIAGRA 100 MG tablet TAKE 1/2-1 TABLET BY MOUTH DAILY AS NEEDED FOR ERECTILE DYSFUNCTION 5 tablet 11   No current facility-administered medications on file prior to visit.     Review of Systems:  .aspoer   Physical Examination: There were no vitals filed for this visit. There were no vitals filed for this visit. There is no height or weight on file to calculate BMI. Ideal Body Weight:    GEN: WDWN, NAD, Non-toxic, A & O x 3 HEENT: Atraumatic, Normocephalic.  Neck supple. No masses, No LAD. Ears and Nose: No external deformity. CV: RRR, No M/G/R. No JVD. No thrill. No extra heart sounds. PULM: CTA B, no wheezes, crackles, rhonchi. No retractions. No resp. distress. No accessory muscle use. ABD: S, NT, ND, +BS. No rebound. No HSM. EXTR: No c/c/e NEURO Normal gait.  PSYCH: Normally interactive. Conversant. Not depressed or anxious appearing.  Calm demeanor.    Assessment and Plan: ***  Signed Lamar Blinks, MD

## 2018-12-03 ENCOUNTER — Ambulatory Visit: Payer: Medicare Other | Admitting: Family Medicine

## 2018-12-13 MED FILL — traMADol HCL 50 MG TABS: 50 | 8 days supply | Qty: 30 | Fill #0

## 2018-12-19 ENCOUNTER — Telehealth: Payer: Self-pay | Admitting: *Deleted

## 2018-12-19 MED ORDER — TRAMADOL HCL 50 MG PO TABS: 50.0000 mg | ORAL_TABLET | Freq: Four times a day (QID) | ORAL | 0 refills | Status: DC | PRN

## 2018-12-19 MED ORDER — FINASTERIDE 5 MG PO TABS: 5.0000 mg | ORAL_TABLET | Freq: Every day | ORAL | 0 refills | Status: DC

## 2018-12-19 NOTE — Telephone Encounter (Signed)
S/w pt and pt's wife, pt cannot hear very well. Spoke with the wife.   Pt took bp while on phone 112/73  HR 72.  Wt is 217 lb.  Only concern was fatigue.  Couple changes on med list, this is updated,  no refills needed at this time.   Pt was advised to read consent sent via mychart and will  Be calling 15 minutes prior to VT visit.  Will send to [facility].

## 2018-12-19 NOTE — Telephone Encounter (Signed)
S/w pt's wife pt was in shower will call back.

## 2018-12-19 NOTE — Telephone Encounter (Signed)
Noted that Ultram has been added to his med list - THIS WAS NOT PRESCRIBED BY ME.

## 2018-12-23 NOTE — Progress Notes (Signed)
Telehealth Visit     Virtual Visit via Phone Note   This visit type was conducted due to national recommendations for restrictions regarding the COVID-19 Pandemic (e.g. social distancing) in an effort to limit this patient's exposure and mitigate transmission in our community.  Due to his co-morbid illnesses, this patient is at least at moderate risk for complications without adequate follow up.  This format is felt to be most appropriate for this patient at this time.  All issues noted in this document were discussed and addressed.  A limited physical exam was performed with this format.  Please refer to the patient's chart for his consent to telehealth for [facility].   Evaluation Performed:  Follow-up visit  This visit type was conducted due to national recommendations for restrictions regarding the COVID-19 Pandemic (e.g. social distancing).  This format is felt to be most appropriate for this patient at this time.  All issues noted in this document were discussed and addressed.  No physical exam was performed (except for noted visual exam findings with Video Visits).  Please refer to the patient's chart (MyChart message for video visits and phone note for telephone visits) for the patient's consent to telehealth for [facility].  Date:  12/24/2018   ID:  Chris Welch, DOB [birth year], MRN [id number]  Patient Location:  Home  Provider location:   Home  PCP:  Copland, Gay Filler, MD  Cardiologist:  Marisa Cyphers Electrophysiologist:  None   Chief Complaint:  Follow up visit.   History of Present Illness:    Chris Welch is a 73 y.o. male who presents via audio/video conferencing for a telehealth visit today.  Seen for Dr. Marlou Porch.   He has a history of HTN - labile. He has had hypotension on the golf course in the setting of a UTI. Also with HLD. He has had a remote carotid tumor removed that resulted in a paralyzed vocal cord - he has chronic cough as a  result. Remote stress testing in 2015. Last seen by me back in July of 2019 - was doing well - BP was good. Staying active. Labs are typically done by [facility] and PCP.   The patient does not have symptoms concerning for COVID-19 infection (fever, chills, cough, or new shortness of breath).   Seen today via phone - his video would not work - so we switched over to telephone visit. He has consented to this visit. His wife was in on the conversation as well.  He has been doing ok. Tolerating his medicines. BP has been doing well. Checked last week and was ok. Other readings since then are good too.  HR is good. Not lightheaded or dizzy. He has been found to have MGUS since I last saw him - he is seeing oncology thru the [place] and has had a bone marrow biopsy - he is to see them at least once a year - on no active therapy other than observation. He is no longer on Mobic - concern for kidney - now on Ultram -has chronic knee pain. He is active in his yard - no golf due to the COVID but trying to get outside.  All of his meds are thru the [place]. Overall, he feels like he is doing well.   Past Medical History:  Diagnosis Date  . Arthritis    "hips, knees" (01/27/2016)  . Depression   . Diverticulitis   . Diverticulosis   . Ear infection ear infection lasting  past 2 months, still ongoing, pt on antibiotics & drops. pt states "ear infection has eaten holes through bilateral ear drums,  I am hard of hearing"  . Enlarged prostate   . Esophageal stricture   . Gastropathy    reactive  . GERD (gastroesophageal reflux disease)   . Hard of hearing   . Hyperlipidemia   . Hypertension   . Malignant carotid body tumor ([place])   . Pneumonia    x 3, last 2009 (01/27/2016)  . 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 Vocal cord paralysis    Right   Past Surgical History:  Procedure Laterality Date  . APPENDECTOMY    . BACK SURGERY    . CAROTID BODY TUMOR EXCISION    . CERVICAL  PARAGANGLIOMA EXCISION Right 12/2006  . CHOLECYSTECTOMY OPEN    . DEEP NECK LYMPH NODE BIOPSY / EXCISION    . ESOPHAGOGASTRODUODENOSCOPY (EGD) WITH ESOPHAGEAL DILATION    . INGUINAL HERNIA REPAIR Right 1954  . JOINT REPLACEMENT    . KNEE ARTHROSCOPY Right   . LUMBAR LAMINECTOMY/DECOMPRESSION MICRODISCECTOMY Left 03/09/2013   Procedure: LUMBAR LAMINECTOMY/DECOMPRESSION MICRODISCECTOMY 1 LEVEL;  Surgeon: Eustace Moore, MD;  Location: [place] [facility];  Service: Neurosurgery;  Laterality: Left;  Left lumbar three-four extra-foraminal microdiscectomy  . PATELLA ARTHROPLASTY Right    has had 5 surgeries right knee  . TOTAL HIP ARTHROPLASTY Left 01/27/2016  . TOTAL HIP ARTHROPLASTY Left 01/27/2016   Procedure: TOTAL HIP ARTHROPLASTY ANTERIOR APPROACH;  Surgeon: Frederik Pear, MD;  Location: [place];  Service: Orthopedics;  Laterality: Left;  [name] Kitchen VOCAL CORD IMPLANT       Current Meds  Medication Sig  . albuterol (PROVENTIL HFA;VENTOLIN HFA) 108 (90 Base) MCG/ACT inhaler Inhale 2 puffs into the lungs every 6 (six) hours as needed for wheezing or shortness of breath.  [name] Kitchen aspirin EC 81 MG tablet Take 81 mg by mouth daily.  [name] Kitchen buPROPion (WELLBUTRIN XL) 150 MG 24 hr tablet Take 3 tablets (450 mg total) by mouth daily.  . finasteride (PROSCAR) 5 MG tablet Take 1 tablet (5 mg total) by mouth daily.  . fluticasone (FLONASE) 50 MCG/ACT nasal spray Place 2 sprays into both nostrils daily.  [name] Kitchen glycopyrrolate (ROBINUL) 1 MG tablet Take 1 tablet (1 mg total) by mouth 3 (three) times daily.  [name] Kitchen losartan (COZAAR) 50 MG tablet TAKE 1 TABLET BY MOUTH ONCE DAILY  . omeprazole (PRILOSEC) 40 MG capsule TAKE 1 CAPSULE BY MOUTH 2 TIMES DAILY.  . pravastatin (PRAVACHOL) 20 MG tablet Take 1 tablet (20 mg total) by mouth daily.  . traMADol (ULTRAM) 50 MG tablet Take 1 tablet (50 mg total) by mouth every 6 (six) hours as needed for moderate pain.  . traZODone (DESYREL) 50 MG tablet TAKE 1 TABLET BY MOUTH AT BEDTIME  . VIAGRA 100 MG  tablet TAKE 1/2-1 TABLET BY MOUTH DAILY AS NEEDED FOR ERECTILE DYSFUNCTION     Allergies:   Hydrocodone and Penicillins   Social History   Tobacco Use  . Smoking status: Never Smoker  . Smokeless tobacco: Never Used  Substance Use Topics  . Alcohol use: Yes    Alcohol/week: 3.0 standard drinks    Types: 3 Cans of beer per week  . Drug use: No     Family Hx: The patient's family history includes Arthritis in his brother; Cancer in his brother; Emphysema in his father; Heart disease in his mother; Lung cancer (age of onset: 41)  in his brother.  ROS:   Please see the history of present illness.   All other systems reviewed are negative except for none.    Objective:    Vital Signs:  BP 112/73   Pulse 72   Wt 217 lb (98.4 kg)   BMI 27.12 kg/m    Wt Readings from Last 3 Encounters:  12/24/18 217 lb (98.4 kg)  05/24/18 225 lb (102.1 kg)  04/03/18 223 lb 6.4 oz (101.3 kg)    He is alert. He is in no acute distress. Not short of breath with conversation. Appropriate in responses.   Labs/Other Tests and Data Reviewed:    Lab Results  Component Value Date   WBC 6.1 12/04/2017   HGB 13.4 12/04/2017   HCT 40.6 12/04/2017   PLT 179 12/04/2017   GLUCOSE 91 05/24/2018   CHOL 132 05/24/2018   TRIG 65.0 05/24/2018   HDL 45.90 05/24/2018   LDLCALC 73 05/24/2018   ALT 24 05/24/2018   AST 19 05/24/2018   NA 140 05/24/2018   K 4.8 05/24/2018   CL 103 05/24/2018   CREATININE 1.06 05/24/2018   BUN 15 05/24/2018   CO2 32 05/24/2018   TSH 1.67 01/05/2016   PSA 0.87 05/24/2018   INR 1.09 01/15/2016   HGBA1C 5.5 03/01/2017     BNP (last 3 results) No results for input(s): BNP in the last 8760 hours.  ProBNP (last 3 results) No results for input(s): PROBNP in the last 8760 hours.    Prior CV studies:    The following studies were reviewed today:  N/A   ASSESSMENT & PLAN:    1. Hypertension - with past history of hypotension -  BP currently doing very well. He  will continue to monitor. No changes made today.   2. HLD - on statin therapy - his labs from September are noted. [facility]  3. MGUS - followed by oncology at the [facility].   4. COVID-19 Education: The signs and symptoms of COVID-19 were discussed with the patient and how to seek care for testing (follow up with PCP or arrange E-visit).  The importance of social distancing, staying at home and hand hygiene were discussed today.  Patient Risk:   After full review of this patient's clinical status, I feel that they are at least moderate risk at this time.  Time:   Today, I have spent 15 minutes with the patient with telehealth technology discussing the above issues.     Medication Adjustments/Labs and Tests Ordered: Current medicines are reviewed at length with the patient today.  Concerns regarding medicines are outlined above.   Tests Ordered: No orders of the defined types were placed in this encounter.   Medication Changes: No orders of the defined types were placed in this encounter.   Disposition:  FU with me in 6 months.   Patient is agreeable to this plan and will call if any problems develop in the interim.   Amie Critchley, NP  12/24/2018 7:56 AM    [place]

## 2018-12-24 ENCOUNTER — Other Ambulatory Visit: Payer: Self-pay

## 2018-12-24 ENCOUNTER — Telehealth (INDEPENDENT_AMBULATORY_CARE_PROVIDER_SITE_OTHER): Payer: Medicare Other | Admitting: Nurse Practitioner

## 2018-12-24 ENCOUNTER — Encounter: Payer: Self-pay | Admitting: Nurse Practitioner

## 2018-12-24 VITALS — BP 112/73 | HR 72 | Wt 217.0 lb

## 2018-12-24 DIAGNOSIS — I10 Essential (primary) hypertension: Secondary | ICD-10-CM | POA: Diagnosis not present

## 2018-12-24 DIAGNOSIS — E7800 Pure hypercholesterolemia, unspecified: Secondary | ICD-10-CM

## 2018-12-24 NOTE — Patient Instructions (Addendum)
After Visit Summary:  We will be checking the following labs today - NONE  Your labs from September look good!   Medication Instructions:    Continue with your current medicines.    If you need a refill on your cardiac medications before your next appointment, please call your pharmacy.     Testing/Procedures To Be Arranged:  N/A  Follow-Up:   We will see you back in 6 months - we will place an appointment in My Chart.    At [facility], you and your health needs are our priority.  As part of our continuing mission to provide you with exceptional heart care, we have created designated Provider Care Teams.  These Care Teams include your primary Cardiologist (physician) and Advanced Practice Providers (APPs -  Physician Assistants and Nurse Practitioners) who all work together to provide you with the care you need, when you need it.  Special Instructions:  . Stay safe, stay home and wash your hands for at least 20 seconds!  Call the [place] office at [phone] if you have any questions, problems or concerns.

## 2019-01-21 MED FILL — traMADol HCL 50 MG TABS: 50 | 7 days supply | Qty: 30 | Fill #0

## 2019-02-27 ENCOUNTER — Ambulatory Visit: Payer: Self-pay

## 2019-02-27 DIAGNOSIS — S68115A Complete traumatic metacarpophalangeal amputation of left ring finger, initial encounter: Secondary | ICD-10-CM | POA: Diagnosis not present

## 2019-02-27 MED FILL — SULFAMETHOXAZOLE-TMP DS TAB: 800-160 | 7 days supply | Qty: 14 | Fill #0

## 2019-02-27 MED FILL — OXYCODONE-ACETAMINOPHEN 5-3: 5-325 | 4 days supply | Qty: 24 | Fill #0

## 2019-02-27 NOTE — Telephone Encounter (Signed)
Incoming call from Patient's wife who states her husband has cut his finger on with wood working tool .  States minimal bleeding yet fingernail  Is involved.  Patient wife was wondering if she should go to office or the hospital.  Wife states that she will go to ED.

## 2019-03-06 DIAGNOSIS — S68115A Complete traumatic metacarpophalangeal amputation of left ring finger, initial encounter: Secondary | ICD-10-CM | POA: Diagnosis not present

## 2019-03-06 DIAGNOSIS — M7989 Other specified soft tissue disorders: Secondary | ICD-10-CM | POA: Diagnosis not present

## 2019-03-06 MED FILL — DOXYCYCLINE HYCLATE 100 MG: 100 | 7 days supply | Qty: 14 | Fill #0

## 2019-04-10 ENCOUNTER — Ambulatory Visit (INDEPENDENT_AMBULATORY_CARE_PROVIDER_SITE_OTHER): Payer: Medicare Other | Admitting: Ophthalmology

## 2019-04-10 ENCOUNTER — Encounter (INDEPENDENT_AMBULATORY_CARE_PROVIDER_SITE_OTHER): Payer: Self-pay | Admitting: Ophthalmology

## 2019-04-10 ENCOUNTER — Other Ambulatory Visit: Payer: Self-pay

## 2019-04-10 DIAGNOSIS — I10 Essential (primary) hypertension: Secondary | ICD-10-CM | POA: Diagnosis not present

## 2019-04-10 DIAGNOSIS — H4311 Vitreous hemorrhage, right eye: Secondary | ICD-10-CM | POA: Diagnosis not present

## 2019-04-10 DIAGNOSIS — H35033 Hypertensive retinopathy, bilateral: Secondary | ICD-10-CM | POA: Diagnosis not present

## 2019-04-10 DIAGNOSIS — H43813 Vitreous degeneration, bilateral: Secondary | ICD-10-CM | POA: Diagnosis not present

## 2019-04-10 DIAGNOSIS — H3581 Retinal edema: Secondary | ICD-10-CM

## 2019-04-10 DIAGNOSIS — H25813 Combined forms of age-related cataract, bilateral: Secondary | ICD-10-CM

## 2019-04-10 NOTE — Progress Notes (Addendum)
[facility] Note  04/10/2019     CHIEF COMPLAINT Patient presents for Retina Follow Up   HISTORY OF PRESENT ILLNESS: Chris Welch is a 73 y.o. male who presents to the clinic today for:   HPI    Retina Follow Up    Patient presents with  PVD.  In right eye.  This started 1 day ago.  Duration of 1 day.  Since onset it is gradually worsening.  I, the attending physician,  performed the HPI with the patient and updated documentation appropriately.          Comments    Patient states sudden onset of floater, spot in vision OD--started yesterday. No flashes. Had PVD OS last year with similar symptoms. Vision seems OK OU. Not on any eye gtts. On aspirin 81 mg daily, no other blood thinners.       Last edited by Bernarda Caffey, MD on 04/10/2019  1:06 PM. (History)       Referring physician: Warden Fillers, MD [address] [place],  [place] [address]  HISTORICAL INFORMATION:   Selected notes from the MEDICAL RECORD NUMBER Referred by Dr. Shirleen Schirmer for concern of VH and possible tear OS;  LEE-  Ocular Hx-  PMH- arthritis,     CURRENT MEDICATIONS: No current outpatient medications on file. (Ophthalmic Drugs)   No current facility-administered medications for this visit.  (Ophthalmic Drugs)   Current Outpatient Medications (Other)  Medication Sig  . albuterol (PROVENTIL HFA;VENTOLIN HFA) 108 (90 Base) MCG/ACT inhaler Inhale 2 puffs into the lungs every 6 (six) hours as needed for wheezing or shortness of breath.  [name] Kitchen aspirin EC 81 MG tablet Take 81 mg by mouth daily.  [name] Kitchen buPROPion (WELLBUTRIN XL) 150 MG 24 hr tablet Take 3 tablets (450 mg total) by mouth daily.  . finasteride (PROSCAR) 5 MG tablet Take 1 tablet (5 mg total) by mouth daily.  . fluticasone (FLONASE) 50 MCG/ACT nasal spray Place 2 sprays into both nostrils daily.  [name] Kitchen glycopyrrolate (ROBINUL) 1 MG tablet Take 1 tablet (1 mg total) by mouth 3 (three) times daily.  [name] Kitchen  losartan (COZAAR) 50 MG tablet TAKE 1 TABLET BY MOUTH ONCE DAILY  . omeprazole (PRILOSEC) 40 MG capsule TAKE 1 CAPSULE BY MOUTH 2 TIMES DAILY.  . pravastatin (PRAVACHOL) 20 MG tablet Take 1 tablet (20 mg total) by mouth daily.  . traMADol (ULTRAM) 50 MG tablet Take 1 tablet (50 mg total) by mouth every 6 (six) hours as needed for moderate pain.  . traZODone (DESYREL) 50 MG tablet TAKE 1 TABLET BY MOUTH AT BEDTIME  . VIAGRA 100 MG tablet TAKE 1/2-1 TABLET BY MOUTH DAILY AS NEEDED FOR ERECTILE DYSFUNCTION   No current facility-administered medications for this visit.  (Other)      REVIEW OF SYSTEMS: ROS    Positive for: Musculoskeletal, Eyes   Negative for: Constitutional, Gastrointestinal, Neurological, Skin, Genitourinary, HENT, Endocrine, Cardiovascular, Respiratory, Psychiatric, Allergic/Imm, Heme/Lymph   Last edited by Roselee Nova D on 04/10/2019 12:59 PM. (History)       ALLERGIES Allergies  Allergen Reactions  . Hydrocodone Nausea And Vomiting    "can not take on an empty stomach"  . Penicillins Other (See Comments) and Rash    Ulcers on eyes    PAST MEDICAL HISTORY Past Medical History:  Diagnosis Date  . Arthritis    "hips, knees" (01/27/2016)  . Depression   . Diverticulitis   . Diverticulosis   .  Ear infection ear infection lasting past 2 months, still ongoing, pt on antibiotics & drops. pt states "ear infection has eaten holes through bilateral ear drums,  I am hard of hearing"  . Enlarged prostate   . Esophageal stricture   . Gastropathy    reactive  . GERD (gastroesophageal reflux disease)   . Hard of hearing   . Hyperlipidemia   . Hypertension   . 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 Pneumonia    x 3, last 2009 (01/27/2016)  . 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 Vocal cord paralysis    Right   Past Surgical History:  Procedure Laterality Date  . APPENDECTOMY    . BACK SURGERY    . CAROTID BODY TUMOR EXCISION     . CERVICAL PARAGANGLIOMA EXCISION Right 12/2006  . CHOLECYSTECTOMY OPEN    . DEEP NECK LYMPH NODE BIOPSY / EXCISION    . ESOPHAGOGASTRODUODENOSCOPY (EGD) WITH ESOPHAGEAL DILATION    . INGUINAL HERNIA REPAIR Right 1954  . JOINT REPLACEMENT    . KNEE ARTHROSCOPY Right   . LUMBAR LAMINECTOMY/DECOMPRESSION MICRODISCECTOMY Left 03/09/2013   Procedure: LUMBAR LAMINECTOMY/DECOMPRESSION MICRODISCECTOMY 1 LEVEL;  Surgeon: Eustace Moore, MD;  Location: [facility];  Service: Neurosurgery;  Laterality: Left;  Left lumbar three-four extra-foraminal microdiscectomy  . PATELLA ARTHROPLASTY Right    has had 5 surgeries right knee  . TOTAL HIP ARTHROPLASTY Left 01/27/2016  . TOTAL HIP ARTHROPLASTY Left 01/27/2016   Procedure: TOTAL HIP ARTHROPLASTY ANTERIOR APPROACH;  Surgeon: Frederik Pear, MD;  Location: [facility];  Service: Orthopedics;  Laterality: Left;  [name] Kitchen VOCAL CORD IMPLANT      FAMILY HISTORY Family History  Problem Relation Age of Onset  . Arthritis Brother        back  . Lung cancer Brother 14        (dx to death: 30 days)  . Cancer Brother        rare cancer-unknown type  . Heart disease Mother   . Emphysema Father     SOCIAL HISTORY Social History   Tobacco Use  . Smoking status: Never Smoker  . Smokeless tobacco: Never Used  Substance Use Topics  . Alcohol use: Yes    Alcohol/week: 3.0 standard drinks    Types: 3 Cans of beer per week  . Drug use: No         OPHTHALMIC EXAM:  Base Eye Exam    Visual Acuity (Snellen - Linear)      Right Left   Dist cc 20/40 -2 20/25   Dist ph cc 20/40 NI   Correction: Glasses       Tonometry (Tonopen, 1:10 PM)      Right Left   Pressure 15 16       Pupils      Dark Light Shape React APD   Right 3 2 Round Brisk None   Left 3 2 Round Brisk None       Visual Fields (Counting fingers)      Left Right    Full Full       Extraocular Movement      Right Left    Full, Ortho Full, Ortho       Neuro/Psych    Oriented x3: Yes    Mood/Affect: Normal       Dilation    Both eyes: 1.0% Mydriacyl, 2.5% Phenylephrine @ 1:10 PM  Dilation #2    Both eyes: 10% phenyepherine, , 1.0% Mydriacyl @ 1:29 PM        Slit Lamp and Fundus Exam    Slit Lamp Exam      Right Left   Lids/Lashes Dermatochalasis - upper lid, Telangiectasia vessels Dermatochalasis - upper lid, Telangiectasia vessels, Ptosis   Conjunctiva/Sclera White and quiet White and quiet   Cornea Mild Arcus, otherwise clear Mild Arcus, otherwise clear   Anterior Chamber Deep and quiet Deep and quiet   Iris Round and moderately dilated to 5 mm Round and dilated to 49mm   Lens 3+ Nuclear sclerosis, 2-3+ Cortical cataract 2-3+ Nuclear sclerosis, 2-3+ Cortical cataract   Vitreous Vitreous syneresis, +RBC's in anterior vitreous, Posterior vitreous detachment with Mariel Kansky ring, mild diffuse VH Posterior vitreous detachment, , Weiss ring-settling inferiorly        Fundus Exam      Right Left   Disc pink and sharp Pink and Sharp   C/D Ratio 0.4 0.3   Macula Mild Retinal pigment epithelial mottling, No heme or edema, slightly hazy view Flat, No heme or edema   Vessels Mild Vascular attenuation, otherwise normal Mild Vascular attenuation, otherwise normal   Periphery Attached, no RT/RD, VR tuft 900 ORA Attached        Refraction    Wearing Rx      Sphere Cylinder Axis Add   Right -0.50 +1.75 177 +2.50   Left [place] +1.00 054 +2.50   Type: PAL       Manifest Refraction      Sphere Cylinder Axis Dist VA   Right -1.75 +1.50 175 20/30   Left -0.50 +1.00 055 20/25+2          IMAGING AND PROCEDURES  Imaging and Procedures for 01/12/18  OCT, Retina - OU - Both Eyes       Right Eye Quality was good. Central Foveal Thickness: 282. Progression has been stable. Findings include normal foveal contour, no IRF, no SRF (Interval release of VMA, mild vitreous opacities).   Left Eye Quality was good. Central Foveal Thickness: 280. Progression has been  stable. Findings include normal foveal contour, no IRF, no SRF.   Notes *Images captured and stored on drive  Diagnosis / Impression:  NFP, No IRF/SRF OU Interval development of PVD with vitreous opacities OD  Clinical management:  See below  Abbreviations: NFP - Normal foveal profile. CME - cystoid macular edema. PED - pigment epithelial detachment. IRF - intraretinal fluid. SRF - subretinal fluid. EZ - ellipsoid zone. ERM - epiretinal membrane. ORA - outer retinal atrophy. ORT - outer retinal tubulation. SRHM - subretinal hyper-reflective material                  ASSESSMENT/PLAN:    ICD-10-CM   1. PVD (posterior vitreous detachment), bilateral  H43.813   2. Vitreous hemorrhage of right eye ([place])  H43.11   3. Retinal edema  H35.81 OCT, Retina - OU - Both Eyes  4. Essential hypertension  I10   5. Hypertensive retinopathy of both eyes  H35.033   6. Combined forms of age-related cataract of both eyes  H25.813     1,2. 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 No retinal edema on exam or OCT  4,5. Hypertensive retinopathy OU - discussed importance of tight BP control - monitor  6. 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 - discussed diagnosis and progression - not yet visually significant - monitor for now   Ophthalmic Meds Ordered this visit:  No orders of the defined types were placed in this encounter.      Return 2-3 weeks, for Dilated exam.  There are no Patient Instructions on file for this visit.   Explained the  diagnoses, plan, and follow up with the patient and they expressed understanding.  Patient expressed understanding of the importance of proper follow up care.    This document serves as a record of services personally performed by Gardiner Sleeper, MD, PhD. It was created on their behalf by Roselee Nova, COMT. The creation of this record is the provider's dictation and/or activities during the visit.  Electronically signed by: Roselee Nova, COMT 04/10/19 1:59 PM   Gardiner Sleeper, M.D., Ph.D. Diseases & Surgery of the Retina and Vitreous [facility]  I have reviewed the above documentation for accuracy and completeness, and I agree with the above. Gardiner Sleeper, M.D., Ph.D. 04/02/18 1:59 PM   Abbreviations: M myopia (nearsighted); A astigmatism; H hyperopia (farsighted); P presbyopia; Mrx spectacle prescription;  CTL contact lenses; OD right eye; OS left eye; OU both eyes  XT exotropia; ET esotropia; PEK punctate epithelial keratitis; PEE punctate epithelial erosions; DES dry eye syndrome; MGD meibomian gland dysfunction; ATs artificial tears; PFAT's preservative free artificial tears; [place] nuclear sclerotic cataract; PSC posterior subcapsular cataract; ERM epi-retinal membrane; PVD posterior vitreous detachment; RD retinal detachment; DM diabetes mellitus; DR diabetic retinopathy; NPDR non-proliferative diabetic retinopathy; PDR proliferative diabetic retinopathy; CSME clinically significant macular edema; DME diabetic macular edema; dbh dot blot hemorrhages; CWS cotton wool spot; POAG primary open angle glaucoma; C/D cup-to-disc ratio; HVF humphrey visual field; GVF goldmann visual field; OCT optical coherence tomography; IOP intraocular pressure; BRVO Branch retinal vein occlusion; CRVO central retinal vein occlusion; CRAO central retinal artery occlusion; BRAO branch retinal artery occlusion; RT retinal tear; SB scleral buckle; PPV pars plana vitrectomy; VH Vitreous  hemorrhage; PRP panretinal laser photocoagulation; IVK intravitreal kenalog; VMT vitreomacular traction; MH Macular hole;  NVD neovascularization of the disc; NVE neovascularization elsewhere; AREDS age related eye disease study; ARMD age related macular degeneration; POAG primary open angle glaucoma; EBMD epithelial/anterior basement membrane dystrophy; ACIOL anterior chamber intraocular lens; IOL intraocular lens; PCIOL posterior chamber intraocular lens; Phaco/IOL phacoemulsification with intraocular lens placement; [place] photorefractive keratectomy; LASIK laser assisted in situ keratomileusis; HTN hypertension; DM diabetes mellitus; COPD chronic obstructive pulmonary disease

## 2019-04-24 ENCOUNTER — Encounter (INDEPENDENT_AMBULATORY_CARE_PROVIDER_SITE_OTHER): Payer: Medicare Other | Admitting: Ophthalmology

## 2019-05-13 ENCOUNTER — Telehealth: Payer: Self-pay | Admitting: *Deleted

## 2019-05-13 ENCOUNTER — Other Ambulatory Visit: Payer: Self-pay | Admitting: Orthopedic Surgery

## 2019-05-13 NOTE — Telephone Encounter (Signed)
Pt notified, he will call to discuss ASA hold time Called requesting practice, they will CB if fax is not received

## 2019-05-13 NOTE — Telephone Encounter (Signed)
   [facility] Pre-operative Risk Assessment    Request for surgical clearance:  1. What type of surgery is being performed? REMOVAL OF NAIL HORNS ON LEFT RING FINGER   2. When is this surgery scheduled? 07/30/19   3. What type of clearance is required (medical clearance vs. Pharmacy clearance to hold med vs. Both)? MEDICAL  4. Are there any medications that need to be held prior to surgery and how long? ASA   5. Practice name and name of physician performing surgery? [facility]; DR. Fredna Dow   6. What is your office phone number [phone]    7.   What is your office fax number [phone]  8.   Anesthesia type (None, local, MAC, general) ? IV REGIONAL FOREARM BLOCK   Chris Welch 05/13/2019, 3:23 PM  _________________________________________________________________   (provider comments below)

## 2019-05-13 NOTE — Telephone Encounter (Signed)
   Primary Cardiologist: Candee Furbish, MD  Chart reviewed as part of pre-operative protocol coverage. Patient was contacted 05/13/2019 in reference to pre-operative risk assessment for pending surgery as outlined below.  Chris Welch was last seen on 12/24/18 by Truitt Merle NP.  Since that day, Chris Welch has done well. He does not have a history of CAD and can complete more than 4.0 METS without anginal symptoms. Generally, we do not hold ASA during the perioperative periods. However, if necessary, may hold 5-7 days.    Therefore, based on ACC/AHA guidelines, the patient would be at acceptable risk for the planned procedure without further cardiovascular testing.   I will route this recommendation to the requesting party via Epic fax function and remove from pre-op pool.  Please call with questions.  Russell, PA 05/13/2019, 4:23 PM

## 2019-05-18 NOTE — Progress Notes (Addendum)
[facility], [place], [place] [address] [phone]  Date:  05/29/2019   Name:  Chris [facility]   DOB:  [birth year]   MRN:  [id number]  PCP:  Darreld Mclean, MD    Chief Complaint: Hypertension (wellness check)   History of Present Illness:  Chris [facility] is a 73 y.o. 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)with history of recurrent aspiration bronchitis/ pneumonia, [facility] his MGUS is stable, for now they are monitoring, follow-up appt next month   He had amputation of the distal phalanx of his left ring finger recently- this has healed up well, no pain He reports that his hand surgeon does plan to go back for another procedure next month, to remove a piece of fingernail which is growing into the skin  Last seen by myself about one year ago  He enjoys playing golf- he is playing some currently and enjoying it  Married to Gerrard who is also my patient   He is generally feeling well, he can do what he wants to do   Colon cancer screen appears to be due this year; he is a Financial controller pt, 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   This is been evaluated through the [place]  He is [facility] and wears hearing aids No CP or SOB with exercise     Patient Active Problem List   Diagnosis Date Noted  . Degenerative joint disease (DJD) of hip 01/05/2016  . Atypical chest pain 04/22/2014  . Gastric reflux with aspiration 03/04/2014  . Carpal tunnel  syndrome 11/26/2013  . HTN (hypertension) 11/26/2013  . AR (allergic rhinitis) 11/26/2013  . Low sodium levels 03/13/2013  . BPH associated with nocturia 08/21/2012  . Hyperlipidemia 08/21/2012  . Recurrent aspiration bronchitis/pneumonia ([place]) 10/05/2011  . Diverticulosis 10/05/2011  . Hearing loss of both ears 10/05/2011  . Depression 10/05/2011  . 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 Arthritis    "hips, knees" (01/27/2016)  . Depression   . Diverticulitis   . Diverticulosis   . Ear infection ear infection lasting past 2 months, still ongoing, pt on antibiotics & drops. pt states "ear infection has eaten holes through bilateral ear drums,  I am hard of hearing"  . Enlarged prostate   . Esophageal stricture   . Gastropathy    reactive  . GERD (gastroesophageal reflux disease)   . Hard of hearing   . Hyperlipidemia   . Hypertension   . 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 Pneumonia    x 3, last 2009 (01/27/2016)  . 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 Vocal cord paralysis    Right    Past Surgical History:  Procedure Laterality Date  . APPENDECTOMY    . BACK SURGERY    . CAROTID BODY  TUMOR EXCISION    . CERVICAL PARAGANGLIOMA EXCISION Right 12/2006  . CHOLECYSTECTOMY OPEN    . DEEP NECK LYMPH NODE BIOPSY / EXCISION    . ESOPHAGOGASTRODUODENOSCOPY (EGD) WITH ESOPHAGEAL DILATION    . INGUINAL HERNIA REPAIR Right 1954  . JOINT REPLACEMENT    . KNEE ARTHROSCOPY Right   . LUMBAR LAMINECTOMY/DECOMPRESSION MICRODISCECTOMY Left 03/09/2013   Procedure: LUMBAR LAMINECTOMY/DECOMPRESSION MICRODISCECTOMY 1 LEVEL;  Surgeon: Eustace Moore, MD;  Location: [facility];  Service: Neurosurgery;  Laterality: Left;  Left lumbar three-four extra-foraminal microdiscectomy  . PATELLA ARTHROPLASTY Right    has had 5 surgeries right knee  . TOTAL HIP ARTHROPLASTY Left 01/27/2016  . TOTAL HIP ARTHROPLASTY Left  01/27/2016   Procedure: TOTAL HIP ARTHROPLASTY ANTERIOR APPROACH;  Surgeon: Frederik Pear, MD;  Location: [facility];  Service: Orthopedics;  Laterality: Left;  [name] Kitchen VOCAL CORD IMPLANT      Social History   Tobacco Use  . Smoking status: Never Smoker  . Smokeless tobacco: Never Used  Substance Use Topics  . Alcohol use: Yes    Alcohol/week: 3.0 standard drinks    Types: 3 Cans of beer per week  . Drug use: No    Family History  Problem Relation Age of Onset  . Arthritis Brother        back  . Lung cancer Brother 70        (dx to death: 30 days)  . Cancer Brother        rare cancer-unknown type  . Heart disease Mother   . Emphysema Father     Allergies  Allergen Reactions  . Hydrocodone Nausea And Vomiting    "can not take on an empty stomach"  . Penicillins Other (See Comments) and Rash    Ulcers on eyes    Medication list has been reviewed and updated.  Current Outpatient Medications on File Prior to Visit  Medication Sig Dispense Refill  . albuterol (PROVENTIL HFA;VENTOLIN HFA) 108 (90 Base) MCG/ACT inhaler Inhale 2 puffs into the lungs every 6 (six) hours as needed for wheezing or shortness of breath.    [name] Kitchen aspirin EC 81 MG tablet Take 81 mg by mouth daily.    [name] Kitchen buPROPion (WELLBUTRIN XL) 150 MG 24 hr tablet Take 3 tablets (450 mg total) by mouth daily. 270 tablet 3  . finasteride (PROSCAR) 5 MG tablet Take 1 tablet (5 mg total) by mouth daily. 30 tablet 0  . fluticasone (FLONASE) 50 MCG/ACT nasal spray Place 2 sprays into both nostrils daily. 48 g 4  . glycopyrrolate (ROBINUL) 1 MG tablet Take 1 tablet (1 mg total) by mouth 3 (three) times daily. 270 tablet 3  . losartan (COZAAR) 50 MG tablet TAKE 1 TABLET BY MOUTH ONCE DAILY 30 tablet 5  . omeprazole (PRILOSEC) 40 MG capsule TAKE 1 CAPSULE BY MOUTH 2 TIMES DAILY. 60 capsule 11  . pravastatin (PRAVACHOL) 20 MG tablet Take 1 tablet (20 mg total) by mouth daily. 90 tablet 3  . traMADol (ULTRAM) 50 MG tablet Take 1 tablet (50 mg  total) by mouth every 6 (six) hours as needed for moderate pain. 30 tablet 0  . traZODone (DESYREL) 50 MG tablet TAKE 1 TABLET BY MOUTH AT BEDTIME 30 tablet 1  . VIAGRA 100 MG tablet TAKE 1/2-1 TABLET BY MOUTH DAILY AS NEEDED FOR ERECTILE DYSFUNCTION 5 tablet 11   No current facility-administered medications on file prior to visit.     Review of Systems:  As per HPI-  otherwise negative. No CP or SOB with exercise He is HOH- this is worse as he cannot read lips due to mask wearing    Physical Examination: Vitals:   05/29/19 0838  BP: 118/70  Pulse: 68  Resp: 16  Temp: (!) 96.5 F (35.8 C)  SpO2: 98%   Vitals:   05/29/19 0838  Weight: 213 lb (96.6 kg)  Height: 6\' 3"  (1.905 m)   Body mass index is 26.62 kg/m. Ideal Body Weight: Weight in (lb) to have BMI = 25: 199.6  GEN: WDWN, NAD, Non-toxic, A & O x 3, mild overweight, looks well HEENT: Atraumatic, Normocephalic. Neck supple. No masses, No LAD. PEERL, hard of hearing Ears and Nose: No external deformity. CV: RRR, No M/G/R. No JVD. No thrill. No extra heart sounds. PULM: CTA B, no wheezes, crackles, rhonchi. No retractions. No resp. distress. No accessory muscle use. ABD: S, NT, ND, +BS. No rebound. No HSM. EXTR: No c/c/e.  Recent amputation of distal portion of left ring finger.  Has healed nicely NEURO Normal gait.  PSYCH: Normally interactive. Conversant. Not depressed or anxious appearing.  Calm demeanor.    Assessment and Plan:   ICD-10-CM   1. Essential hypertension  I10 CBC    Comprehensive metabolic panel  2. Mixed hyperlipidemia  E78.2 Lipid panel  3. MGUS (monoclonal gammopathy of unknown significance)  D47.2   4. BPH associated with nocturia  N40.1 PSA   R35.1   5. Vocal cord paralysis  J38.00   6. Recurrent aspiration bronchitis/pneumonia (HCC)  J69.0   7. Screening for diabetes mellitus  Z13.1 Hemoglobin A1c  8. Screening for colon cancer  Z12.11 Ambulatory referral to Gastroenterology  9. Elevated  glucose  R73.09 Hemoglobin A1c   Following up for a routine visit today. Flu shot given Routine labs as above [facility] gastroenterology for his colon cancer screening Will plan further follow- up pending labs.    Follow-up: No follow-ups on file.  No orders of the defined types were placed in this encounter.  Orders Placed This Encounter  Procedures  . CBC  . Comprehensive metabolic panel  . Hemoglobin A1c  . PSA  . Lipid panel  . Ambulatory referral to Gastroenterology    Signed Lamar Blinks, MD  Received message from GI: Good morning Dr. Lorelei Pont, Dr. Loletha Carrow has reviewed pt's chart--as per new guidelines, pt is not due for recall colonoscopy until 2022 (recall in Epic.)   Thank you for the referral.   -advised pt of same    Received his labs, message to pt  Results for orders placed or performed in visit on 05/29/19  CBC  Result Value Ref Range   WBC 6.4 4.0 - 10.5 K/uL   RBC 4.14 (L) 4.22 - 5.81 Mil/uL   Platelets 173.0 150.0 - 400.0 K/uL   Hemoglobin 12.4 (L) 13.0 - 17.0 g/dL   HCT 37.1 (L) 39.0 - 52.0 %   MCV 89.8 78.0 - 100.0 fl   MCHC 33.3 30.0 - 36.0 g/dL   RDW 13.8 11.5 - 15.5 %  Comprehensive metabolic panel  Result Value Ref Range   Sodium 138 135 - 145 mEq/L   Potassium 4.0 3.5 - 5.1 mEq/L   Chloride 102 96 - 112 mEq/L   CO2 30 19 - 32 mEq/L   Glucose, Bld 89 70 - 99 mg/dL   BUN 16 6 - 23 mg/dL   Creatinine, Ser 1.13 0.40 - 1.50  mg/dL   Total Bilirubin 1.3 (H) 0.2 - 1.2 mg/dL   Alkaline Phosphatase 76 39 - 117 U/L   AST 11 0 - 37 U/L   ALT 12 0 - 53 U/L   Total Protein 7.1 6.0 - 8.3 g/dL   Albumin 3.9 3.5 - 5.2 g/dL   Calcium 8.8 8.4 - 10.5 mg/dL   GFR 63.66 >60.00 mL/min  Hemoglobin A1c  Result Value Ref Range   Hgb A1c MFr Bld 5.5 4.6 - 6.5 %  PSA  Result Value Ref Range   PSA 1.31 0.10 - 4.00 ng/mL  Lipid panel  Result Value Ref Range   Cholesterol 158 0 - 200 mg/dL    Triglycerides 79.0 0.0 - 149.0 mg/dL   HDL 38.20 (L) >39.00 mg/dL   VLDL 15.8 0.0 - 40.0 mg/dL   LDL Cholesterol 104 (H) 0 - 99 mg/dL   Total CHOL/HDL Ratio 4    NonHDL 119.54    The 10-year ASCVD risk score Mikey Bussing DC Jr., et al., 2013) is: 21%   Values used to calculate the score:     Age: 12 years     Sex: Male     Is Non-Hispanic African American: No     Diabetic: No     Tobacco smoker: No     Systolic Blood Pressure: 123456 mmHg     Is BP treated: Yes     HDL Cholesterol: 38.2 mg/dL     Total Cholesterol: 158 mg/dL Lab Results  Component Value Date   PSA 1.31 05/29/2019   PSA 0.87 05/24/2018   PSA 1.39 03/01/2017   2017 1.75

## 2019-05-20 NOTE — Progress Notes (Addendum)
[facility] Note  05/22/2019     CHIEF COMPLAINT Patient presents for Retina Follow Up   HISTORY OF PRESENT ILLNESS: Chris Welch is a 73 y.o. male who presents to the clinic today for:   HPI    Retina Follow Up    Patient presents with  PVD.  In right eye.  This started weeks ago.  Severity is moderate.  Duration of weeks.  Since onset it is gradually improving.  I, the attending physician,  performed the HPI with the patient and updated documentation appropriately.          Comments    Patient states right eye is improving slowly.  Patient has new floaters OD that he states he did not have the last time he was here.  Patient denies flashes of light.  Patient denies floaters or flashes of light OS.  Patient denies eye pain or discomfort.       Last edited by Bernarda Caffey, MD on 05/22/2019 10:42 PM. (History)    pt states he is due to follow up bc he cut the tip of his finger off with a saw, pt states his right eye is still a little bit foggier than his left eye, he states he still has a few floaters, but overall his vision is improved   Referring physician: Warden Fillers, MD [address] [place],  [place] [address]  HISTORICAL INFORMATION:   Selected notes from the MEDICAL RECORD NUMBER Referred by Dr. Shirleen Schirmer for concern of VH and possible tear OS;  LEE-  Ocular Hx-  PMH- arthritis,     CURRENT MEDICATIONS: No current outpatient medications on file. (Ophthalmic Drugs)   No current facility-administered medications for this visit.  (Ophthalmic Drugs)   Current Outpatient Medications (Other)  Medication Sig  . albuterol (PROVENTIL HFA;VENTOLIN HFA) 108 (90 Base) MCG/ACT inhaler Inhale 2 puffs into the lungs every 6 (six) hours as needed for wheezing or shortness of breath.  [name] Kitchen aspirin EC 81 MG tablet Take 81 mg by mouth daily.  [name] Kitchen buPROPion (WELLBUTRIN XL) 150 MG 24 hr tablet Take 3 tablets (450 mg total) by mouth daily.   . finasteride (PROSCAR) 5 MG tablet Take 1 tablet (5 mg total) by mouth daily.  . fluticasone (FLONASE) 50 MCG/ACT nasal spray Place 2 sprays into both nostrils daily.  [name] Kitchen glycopyrrolate (ROBINUL) 1 MG tablet Take 1 tablet (1 mg total) by mouth 3 (three) times daily.  [name] Kitchen losartan (COZAAR) 50 MG tablet TAKE 1 TABLET BY MOUTH ONCE DAILY  . omeprazole (PRILOSEC) 40 MG capsule TAKE 1 CAPSULE BY MOUTH 2 TIMES DAILY.  . pravastatin (PRAVACHOL) 20 MG tablet Take 1 tablet (20 mg total) by mouth daily.  . traMADol (ULTRAM) 50 MG tablet Take 1 tablet (50 mg total) by mouth every 6 (six) hours as needed for moderate pain.  . traZODone (DESYREL) 50 MG tablet TAKE 1 TABLET BY MOUTH AT BEDTIME  . VIAGRA 100 MG tablet TAKE 1/2-1 TABLET BY MOUTH DAILY AS NEEDED FOR ERECTILE DYSFUNCTION   No current facility-administered medications for this visit.  (Other)      REVIEW OF SYSTEMS: ROS    Positive for: Musculoskeletal, Eyes   Negative for: Constitutional, Gastrointestinal, Neurological, Skin, Genitourinary, HENT, Endocrine, Cardiovascular, Respiratory, Psychiatric, Allergic/Imm, Heme/Lymph   Last edited by Doneen Poisson on 05/22/2019  8:44 AM. (History)       ALLERGIES Allergies  Allergen Reactions  . Hydrocodone Nausea  And Vomiting    "can not take on an empty stomach"  . Penicillins Other (See Comments) and Rash    Ulcers on eyes    PAST MEDICAL HISTORY Past Medical History:  Diagnosis Date  . Arthritis    "hips, knees" (01/27/2016)  . Depression   . Diverticulitis   . Diverticulosis   . Ear infection ear infection lasting past 2 months, still ongoing, pt on antibiotics & drops. pt states "ear infection has eaten holes through bilateral ear drums,  I am hard of hearing"  . Enlarged prostate   . Esophageal stricture   . Gastropathy    reactive  . GERD (gastroesophageal reflux disease)   . Hard of hearing   . Hyperlipidemia   . Hypertension   . Malignant carotid body tumor ([place])   .  Pneumonia    x 3, last 2009 (01/27/2016)  . 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 Vocal cord paralysis    Right   Past Surgical History:  Procedure Laterality Date  . APPENDECTOMY    . BACK SURGERY    . CAROTID BODY TUMOR EXCISION    . CERVICAL PARAGANGLIOMA EXCISION Right 12/2006  . CHOLECYSTECTOMY OPEN    . DEEP NECK LYMPH NODE BIOPSY / EXCISION    . ESOPHAGOGASTRODUODENOSCOPY (EGD) WITH ESOPHAGEAL DILATION    . INGUINAL HERNIA REPAIR Right 1954  . JOINT REPLACEMENT    . KNEE ARTHROSCOPY Right   . LUMBAR LAMINECTOMY/DECOMPRESSION MICRODISCECTOMY Left 03/09/2013   Procedure: LUMBAR LAMINECTOMY/DECOMPRESSION MICRODISCECTOMY 1 LEVEL;  Surgeon: Eustace Moore, MD;  Location: [facility];  Service: Neurosurgery;  Laterality: Left;  Left lumbar three-four extra-foraminal microdiscectomy  . PATELLA ARTHROPLASTY Right    has had 5 surgeries right knee  . TOTAL HIP ARTHROPLASTY Left 01/27/2016  . TOTAL HIP ARTHROPLASTY Left 01/27/2016   Procedure: TOTAL HIP ARTHROPLASTY ANTERIOR APPROACH;  Surgeon: Frederik Pear, MD;  Location: [facility];  Service: Orthopedics;  Laterality: Left;  [name] Kitchen VOCAL CORD IMPLANT      FAMILY HISTORY Family History  Problem Relation Age of Onset  . Arthritis Brother        back  . Lung cancer Brother 2        (dx to death: 30 days)  . Cancer Brother        rare cancer-unknown type  . Heart disease Mother   . Emphysema Father     SOCIAL HISTORY Social History   Tobacco Use  . Smoking status: Never Smoker  . Smokeless tobacco: Never Used  Substance Use Topics  . Alcohol use: Yes    Alcohol/week: 3.0 standard drinks    Types: 3 Cans of beer per week  . Drug use: No         OPHTHALMIC EXAM:  Base Eye Exam    Visual Acuity (Snellen - Linear)      Right Left   Dist cc 20/40 -1 20/40   Dist ph cc 20/25 20/25   Correction: Glasses       Tonometry (Tonopen, 8:48 AM)      Right Left   Pressure 14 13       Pupils       Dark Light Shape React APD   Right 2 1 Round Minimal 0   Left 2 1 Round Minimal 0       Visual Fields      Left Right    Full Full  Extraocular Movement      Right Left    Full Full       Neuro/Psych    Oriented x3: Yes   Mood/Affect: Normal       Dilation    Both eyes: 1.0% Mydriacyl, 2.5% Phenylephrine @ 8:48 AM        Slit Lamp and Fundus Exam    Slit Lamp Exam      Right Left   Lids/Lashes Dermatochalasis - upper lid Dermatochalasis - upper lid, Telangiectasia vessels, Ptosis   Conjunctiva/Sclera Nasal and temporal Pinguecula Temporal Pinguecula   Cornea Mild Arcus, otherwise clear Mild Arcus, 1+ Punctate epithelial erosions   Anterior Chamber Deep and quiet Deep and quiet   Iris Round and poorly dilated to 4.5 mm Round and moderately dilated to 5.60mm   Lens 3+ Nuclear sclerosis, 2-3+ Cortical cataract 2-3+ Nuclear sclerosis, 2-3+ Cortical cataract   Vitreous Vitreous syneresis, Posterior vitreous detachment with Mariel Kansky ring, VH vastly improved, just trace vitreous condensations inferiorly Posterior vitreous detachment, Weiss ring-settling inferiorly        Fundus Exam      Right Left   Disc pink and sharp Pink and Sharp   C/D Ratio 0.4 0.3   Macula Flat, blunted foveal reflex, Mild Retinal pigment epithelial mottling, No heme or edema, slightly hazy view - improved Flat, Blunted foveal reflex, mild RPE mottling and clumping, No heme or edema   Vessels Mild Vascular attenuation, otherwise normal Mild Vascular attenuation, mild Tortuousity   Periphery Attached, no RT/RD, VR tuft 900 ORA -- no tear or SRF on scleral depressed exam Attached        Refraction    Wearing Rx      Sphere Cylinder Axis Add   Right -0.50 +1.75 177 +2.50   Left [place] +1.00 054 +2.50   Type: PAL          IMAGING AND PROCEDURES  Imaging and Procedures for 01/12/18  OCT, Retina - OU - Both Eyes       Right Eye Quality was good. Central Foveal Thickness: 281. Progression  has improved. Findings include normal foveal contour, no IRF, no SRF (Interval improvement of vitreous opacities).   Left Eye Quality was good. Central Foveal Thickness: 280. Progression has been stable. Findings include normal foveal contour, no IRF, no SRF.   Notes *Images captured and stored on drive  Diagnosis / Impression:  NFP, No IRF/SRF OU OD: Interval improvement of vitreous opacities  Clinical management:  See below  Abbreviations: NFP - Normal foveal profile. CME - cystoid macular edema. PED - pigment epithelial detachment. IRF - intraretinal fluid. SRF - subretinal fluid. EZ - ellipsoid zone. ERM - epiretinal membrane. ORA - outer retinal atrophy. ORT - outer retinal tubulation. SRHM - subretinal hyper-reflective material                  ASSESSMENT/PLAN:    ICD-10-CM   1. PVD (posterior vitreous detachment), bilateral  H43.813   2. Vitreous hemorrhage of right eye ([place])  H43.11   3. Cystic retinal tuft of right eye  H35.461   4. Retinal edema  H35.81 OCT, Retina - OU - Both Eyes  5. Essential hypertension  I10   6. Hypertensive retinopathy of both eyes  H35.033   7. Combined forms of age-related cataract of both eyes  H25.813     1-3. 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 No retinal edema on exam or OCT   5,6. Hypertensive retinopathy OU  - discussed importance of tight BP control  - monitor  7. Combined form age related cataract OU-   - The symptoms of cataract, surgical options, and treatments and risks were discussed with  patient.  - discussed diagnosis and progression  - not yet visually significant  - monitor for now   Ophthalmic Meds Ordered this visit:  No orders of the defined types were placed in this encounter.      Return in about 1 year (around 05/21/2020) for f/u VR tuft OD, 0900.  There are no Patient Instructions on file for this visit.   Explained the diagnoses, plan, and follow up with the patient and they expressed understanding.  Patient expressed understanding of the importance of proper follow up care.    This document serves as a record of services personally performed by Gardiner Sleeper, MD, PhD. It was created on their behalf by Roselee Nova, COMT. The creation of this record is the provider's dictation and/or activities during the visit.  Electronically signed by: Roselee Nova, COMT 05/22/19 10:55 PM   This document serves as a record of services personally performed by Gardiner Sleeper, MD, PhD. It was created on their behalf by Ernest Mallick, OA, an ophthalmic assistant. The creation of this record is the provider's dictation and/or activities during the visit.    Electronically signed by: Ernest Mallick, OA  09.02.2020 10:55 PM     Gardiner Sleeper, M.D., Ph.D. Diseases & Surgery of the Retina and Vitreous [facility]  I have reviewed the above documentation for accuracy and completeness, and I agree with the above. Gardiner Sleeper, M.D., Ph.D. 05/22/19 10:55 PM    Abbreviations: M myopia (nearsighted); A astigmatism; H hyperopia (farsighted); P presbyopia; Mrx spectacle prescription;  CTL contact lenses; OD right eye; OS left eye; OU both eyes  XT exotropia; ET esotropia; PEK punctate epithelial keratitis; PEE punctate epithelial erosions; DES dry eye syndrome; MGD meibomian gland dysfunction; ATs artificial tears; PFAT's preservative free artificial tears; [place] nuclear sclerotic cataract; PSC posterior subcapsular cataract; ERM epi-retinal membrane; PVD  posterior vitreous detachment; RD retinal detachment; DM diabetes mellitus; DR diabetic retinopathy; NPDR non-proliferative diabetic retinopathy; PDR proliferative diabetic retinopathy; CSME clinically significant macular edema; DME diabetic macular edema; dbh dot blot hemorrhages; CWS cotton wool spot; POAG primary open angle glaucoma; C/D cup-to-disc ratio; HVF humphrey visual field; GVF goldmann visual field; OCT optical coherence tomography; IOP intraocular pressure; BRVO Branch retinal vein occlusion; CRVO central retinal vein occlusion; CRAO central retinal artery occlusion; BRAO branch retinal artery occlusion; RT retinal tear; SB scleral buckle; PPV pars plana vitrectomy; VH Vitreous hemorrhage; PRP panretinal laser photocoagulation; IVK intravitreal kenalog; VMT vitreomacular traction; MH Macular hole;  NVD neovascularization of the disc; NVE neovascularization elsewhere; AREDS age related eye disease study; ARMD age related macular degeneration; POAG primary open angle glaucoma; EBMD epithelial/anterior basement membrane dystrophy; ACIOL anterior chamber intraocular lens; IOL intraocular lens; PCIOL posterior chamber intraocular lens; Phaco/IOL phacoemulsification with intraocular lens  placement; [place] photorefractive keratectomy; LASIK laser assisted in situ keratomileusis; HTN hypertension; DM diabetes mellitus; COPD chronic obstructive pulmonary disease

## 2019-05-22 ENCOUNTER — Other Ambulatory Visit: Payer: Self-pay

## 2019-05-22 ENCOUNTER — Encounter (INDEPENDENT_AMBULATORY_CARE_PROVIDER_SITE_OTHER): Payer: Self-pay | Admitting: Ophthalmology

## 2019-05-22 ENCOUNTER — Ambulatory Visit (INDEPENDENT_AMBULATORY_CARE_PROVIDER_SITE_OTHER): Payer: Medicare Other | Admitting: Ophthalmology

## 2019-05-22 DIAGNOSIS — H4311 Vitreous hemorrhage, right eye: Secondary | ICD-10-CM | POA: Diagnosis not present

## 2019-05-22 DIAGNOSIS — H43813 Vitreous degeneration, bilateral: Secondary | ICD-10-CM | POA: Diagnosis not present

## 2019-05-22 DIAGNOSIS — I10 Essential (primary) hypertension: Secondary | ICD-10-CM | POA: Diagnosis not present

## 2019-05-22 DIAGNOSIS — H35461 Secondary vitreoretinal degeneration, right eye: Secondary | ICD-10-CM | POA: Diagnosis not present

## 2019-05-22 DIAGNOSIS — H35033 Hypertensive retinopathy, bilateral: Secondary | ICD-10-CM

## 2019-05-22 DIAGNOSIS — H3581 Retinal edema: Secondary | ICD-10-CM

## 2019-05-22 DIAGNOSIS — H25813 Combined forms of age-related cataract, bilateral: Secondary | ICD-10-CM

## 2019-05-29 ENCOUNTER — Other Ambulatory Visit: Payer: Self-pay

## 2019-05-29 ENCOUNTER — Ambulatory Visit (INDEPENDENT_AMBULATORY_CARE_PROVIDER_SITE_OTHER): Payer: Medicare Other | Admitting: Family Medicine

## 2019-05-29 ENCOUNTER — Encounter: Payer: Self-pay | Admitting: Family Medicine

## 2019-05-29 VITALS — BP 118/70 | HR 68 | Temp 96.5°F | Resp 16 | Ht 75.0 in | Wt 213.0 lb

## 2019-05-29 DIAGNOSIS — J3800 Paralysis of vocal cords and larynx, unspecified: Secondary | ICD-10-CM

## 2019-05-29 DIAGNOSIS — N401 Enlarged prostate with lower urinary tract symptoms: Secondary | ICD-10-CM | POA: Diagnosis not present

## 2019-05-29 DIAGNOSIS — Z23 Encounter for immunization: Secondary | ICD-10-CM

## 2019-05-29 DIAGNOSIS — J690 Pneumonitis due to inhalation of food and vomit: Secondary | ICD-10-CM | POA: Diagnosis not present

## 2019-05-29 DIAGNOSIS — I10 Essential (primary) hypertension: Secondary | ICD-10-CM

## 2019-05-29 DIAGNOSIS — E782 Mixed hyperlipidemia: Secondary | ICD-10-CM | POA: Diagnosis not present

## 2019-05-29 DIAGNOSIS — Z131 Encounter for screening for diabetes mellitus: Secondary | ICD-10-CM | POA: Diagnosis not present

## 2019-05-29 DIAGNOSIS — R7309 Other abnormal glucose: Secondary | ICD-10-CM

## 2019-05-29 DIAGNOSIS — Z1211 Encounter for screening for malignant neoplasm of colon: Secondary | ICD-10-CM | POA: Diagnosis not present

## 2019-05-29 DIAGNOSIS — R351 Nocturia: Secondary | ICD-10-CM | POA: Diagnosis not present

## 2019-05-29 DIAGNOSIS — D472 Monoclonal gammopathy: Secondary | ICD-10-CM | POA: Diagnosis not present

## 2019-05-29 DIAGNOSIS — IMO0002 Reserved for concepts with insufficient information to code with codable children: Secondary | ICD-10-CM

## 2019-05-29 LAB — CBC
HCT: 37.1 % — ABNORMAL LOW (ref 39.0–52.0)
Hemoglobin: 12.4 g/dL — ABNORMAL LOW (ref 13.0–17.0)
MCHC: 33.3 g/dL (ref 30.0–36.0)
MCV: 89.8 fl (ref 78.0–100.0)
Platelets: 173 10*3/uL (ref 150.0–400.0)
RBC: 4.14 Mil/uL — ABNORMAL LOW (ref 4.22–5.81)
RDW: 13.8 % (ref 11.5–15.5)
WBC: 6.4 10*3/uL (ref 4.0–10.5)

## 2019-05-29 LAB — LIPID PANEL
Cholesterol: 158 mg/dL (ref 0–200)
HDL: 38.2 mg/dL — ABNORMAL LOW (ref >39.00)
LDL Cholesterol: 104 mg/dL — ABNORMAL HIGH (ref 0–99)
NonHDL: 119.54
Total CHOL/HDL Ratio: 4
Triglycerides: 79 mg/dL (ref 0.0–149.0)
VLDL: 15.8 mg/dL (ref 0.0–40.0)

## 2019-05-29 LAB — COMPREHENSIVE METABOLIC PANEL
ALT: 12 U/L (ref 0–53)
AST: 11 U/L (ref 0–37)
Albumin: 3.9 g/dL (ref 3.5–5.2)
Alkaline Phosphatase: 76 U/L (ref 39–117)
BUN: 16 mg/dL (ref 6–23)
CO2: 30 mEq/L (ref 19–32)
Calcium: 8.8 mg/dL (ref 8.4–10.5)
Chloride: 102 mEq/L (ref 96–112)
Creatinine, Ser: 1.13 mg/dL (ref 0.40–1.50)
GFR: 63.66 mL/min (ref >60.00)
Glucose, Bld: 89 mg/dL (ref 70–99)
Potassium: 4 mEq/L (ref 3.5–5.1)
Sodium: 138 mEq/L (ref 135–145)
Total Bilirubin: 1.3 mg/dL — ABNORMAL HIGH (ref 0.2–1.2)
Total Protein: 7.1 g/dL (ref 6.0–8.3)

## 2019-05-29 LAB — PSA: PSA: 1.31 ng/mL (ref 0.10–4.00)

## 2019-05-29 LAB — HEMOGLOBIN A1C: Hgb A1c MFr Bld: 5.5 % (ref 4.6–6.5)

## 2019-05-29 NOTE — Patient Instructions (Signed)
Great to see you again today- I will be in touch with your labs asap You got your flu shot today I would recommend that you get the shingles vaccine at your drug store at your convenience  Please let me know what you find out from your upcoming sleep study I will refer you back to [place] for colon cancer screening    Health Maintenance After Age 73 After age 30, you are at a higher risk for certain long-term diseases and infections as well as injuries from falls. Falls are a major cause of broken bones and head injuries in people who are older than age 68. Getting regular preventive care can help to keep you healthy and well. Preventive care includes getting regular testing and making lifestyle changes as recommended by your health care provider. Talk with your health care provider about:  Which screenings and tests you should have. A screening is a test that checks for a disease when you have no symptoms.  A diet and exercise plan that is right for you. What should I know about screenings and tests to prevent falls? Screening and testing are the best ways to find a health problem early. Early diagnosis and treatment give you the best chance of managing medical conditions that are common after age 69. Certain conditions and lifestyle choices may make you more likely to have a fall. Your health care provider may recommend:  Regular vision checks. Poor vision and conditions such as cataracts can make you more likely to have a fall. If you wear glasses, make sure to get your prescription updated if your vision changes.  Medicine review. Work with your health care provider to regularly review all of the medicines you are taking, including over-the-counter medicines. Ask your health care provider about any side effects that may make you more likely to have a fall. Tell your health care provider if any medicines that you take make you feel dizzy or sleepy.  Osteoporosis screening. Osteoporosis is  a condition that causes the bones to get weaker. This can make the bones weak and cause them to break more easily.  Blood pressure screening. Blood pressure changes and medicines to control blood pressure can make you feel dizzy.  Strength and balance checks. Your health care provider may recommend certain tests to check your strength and balance while standing, walking, or changing positions.  Foot health exam. Foot pain and numbness, as well as not wearing proper footwear, can make you more likely to have a fall.  Depression screening. You may be more likely to have a fall if you have a fear of falling, feel emotionally low, or feel unable to do activities that you used to do.  Alcohol use screening. Using too much alcohol can affect your balance and may make you more likely to have a fall. What actions can I take to lower my risk of falls? General instructions  Talk with your health care provider about your risks for falling. Tell your health care provider if: ? You fall. Be sure to tell your health care provider about all falls, even ones that seem minor. ? You feel dizzy, sleepy, or off-balance.  Take over-the-counter and prescription medicines only as told by your health care provider. These include any supplements.  Eat a healthy diet and maintain a healthy weight. A healthy diet includes low-fat dairy products, low-fat (lean) meats, and fiber from whole grains, beans, and lots of fruits and vegetables. Home safety  Remove any tripping  hazards, such as rugs, cords, and clutter.  Install safety equipment such as grab bars in bathrooms and safety rails on stairs.  Keep rooms and walkways well-lit. Activity   Follow a regular exercise program to stay fit. This will help you maintain your balance. Ask your health care provider what types of exercise are appropriate for you.  If you need a cane or walker, use it as recommended by your health care provider.  Wear supportive shoes  that have nonskid soles. Lifestyle  Do not drink alcohol if your health care provider tells you not to drink.  If you drink alcohol, limit how much you have: ? 0-1 drink a day for women. ? 0-2 drinks a day for men.  Be aware of how much alcohol is in your drink. In the [place] one drink equals one typical bottle of beer (12 oz), one-half glass of wine (5 oz), or one shot of hard liquor (1 oz).  Do not use any products that contain nicotine or tobacco, such as cigarettes and e-cigarettes. If you need help quitting, ask your health care provider. Summary  Having a healthy lifestyle and getting preventive care can help to protect your health and wellness after age 65.  Screening and testing are the best way to find a health problem early and help you avoid having a fall. Early diagnosis and treatment give you the best chance for managing medical conditions that are more common for people who are older than age 78.  Falls are a major cause of broken bones and head injuries in people who are older than age 40. Take precautions to prevent a fall at home.  Work with your health care provider to learn what changes you can make to improve your health and wellness and to prevent falls. This information is not intended to replace advice given to you by your health care provider. Make sure you discuss any questions you have with your health care provider. Document Released: 07/19/2017 Document Revised: 12/27/2018 Document Reviewed: 07/19/2017 Elsevier Patient Education  2020 [employer].

## 2019-07-02 ENCOUNTER — Ambulatory Visit: Payer: 59 | Admitting: Nurse Practitioner

## 2019-07-12 ENCOUNTER — Ambulatory Visit (INDEPENDENT_AMBULATORY_CARE_PROVIDER_SITE_OTHER): Payer: Medicare Other | Admitting: Cardiology

## 2019-07-12 ENCOUNTER — Encounter: Payer: Self-pay | Admitting: Cardiology

## 2019-07-12 ENCOUNTER — Other Ambulatory Visit: Payer: Self-pay

## 2019-07-12 VITALS — BP 124/70 | HR 62 | Ht 75.0 in | Wt 211.0 lb

## 2019-07-12 DIAGNOSIS — E7800 Pure hypercholesterolemia, unspecified: Secondary | ICD-10-CM | POA: Diagnosis not present

## 2019-07-12 DIAGNOSIS — I10 Essential (primary) hypertension: Secondary | ICD-10-CM

## 2019-07-12 NOTE — Patient Instructions (Signed)
Medication Instructions:  The current medical regimen is effective;  continue present plan and medications.  *If you need a refill on your cardiac medications before your next appointment, please call your pharmacy*  Follow-Up: At [facility], you and your health needs are our priority.  As part of our continuing mission to provide you with exceptional heart care, we have created designated Provider Care Teams.  These Care Teams include your primary Cardiologist (physician) and Advanced Practice Providers (APPs -  Physician Assistants and Nurse Practitioners) who all work together to provide you with the care you need, when you need it.  Your next appointment:   12 months  The format for your next appointment:   In Person  Provider:   You may see Mark Skains, MD or one of the following Advanced Practice Providers on your designated Care Team:    Lori Gerhardt, NP  Laura Ingold, NP  Jill McDaniel, NP   Thank you for choosing [facility]!!      

## 2019-07-12 NOTE — Progress Notes (Signed)
Cardiology Office Note:    Date:  07/12/2019   ID:  Chris Welch, DOB 02-26-1946, MRN [id number]  PCP:  Darreld Mclean, MD  Cardiologist:  Candee Furbish, MD  Electrophysiologist:  None   Referring MD: Darreld Mclean, MD     History of Present Illness:    Chris Welch is a 72 y.o. male here for the follow-up of labile hypertension.  Last seen by Truitt Merle.  Doing well.  No fevers chills nausea vomiting syncope.  Enjoys golf.  Told me about [facility].  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  Chronic cough.  Overall he is been doing quite well.  Blood pressure under good control.  Enjoys riding his tricycle motorcycle.  No fevers chills nausea vomiting syncope bleeding.  Past Medical History:  Diagnosis Date  . Arthritis    "hips, knees" (01/27/2016)  . Depression   . Diverticulitis   . Diverticulosis   . Ear infection ear infection lasting past 2 months, still ongoing, pt on antibiotics & drops. pt states "ear infection has eaten holes through bilateral ear drums,  I am hard of hearing"  . Enlarged prostate   . Esophageal stricture   . Gastropathy    reactive  . GERD (gastroesophageal reflux disease)   . Hard of hearing   . Hyperlipidemia   . Hypertension   . 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 Pneumonia    x 3, last 2009 (01/27/2016)  . 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 Vocal cord paralysis    Right    Past Surgical History:  Procedure Laterality Date  . APPENDECTOMY    . BACK SURGERY    . CAROTID BODY TUMOR EXCISION    . CERVICAL PARAGANGLIOMA EXCISION Right 12/2006  . CHOLECYSTECTOMY OPEN    . DEEP NECK LYMPH NODE BIOPSY / EXCISION    . ESOPHAGOGASTRODUODENOSCOPY (EGD) WITH ESOPHAGEAL DILATION    . INGUINAL HERNIA REPAIR Right 1954  . JOINT REPLACEMENT    . KNEE ARTHROSCOPY Right   . LUMBAR LAMINECTOMY/DECOMPRESSION MICRODISCECTOMY Left 03/09/2013   Procedure: LUMBAR  LAMINECTOMY/DECOMPRESSION MICRODISCECTOMY 1 LEVEL;  Surgeon: Eustace Moore, MD;  Location: [place] [facility];  Service: Neurosurgery;  Laterality: Left;  Left lumbar three-four extra-foraminal microdiscectomy  . PATELLA ARTHROPLASTY Right    has had 5 surgeries right knee  . TOTAL HIP ARTHROPLASTY Left 01/27/2016  . TOTAL HIP ARTHROPLASTY Left 01/27/2016   Procedure: TOTAL HIP ARTHROPLASTY ANTERIOR APPROACH;  Surgeon: Frederik Pear, MD;  Location: [place];  Service: Orthopedics;  Laterality: Left;  [name] Kitchen VOCAL CORD IMPLANT      Current Medications: Current Meds  Medication Sig  . albuterol (PROVENTIL HFA;VENTOLIN HFA) 108 (90 Base) MCG/ACT inhaler Inhale 2 puffs into the lungs every 6 (six) hours as needed for wheezing or shortness of breath.  [name] Kitchen aspirin EC 81 MG tablet Take 81 mg by mouth daily.  [name] Kitchen buPROPion (WELLBUTRIN XL) 150 MG 24 hr tablet Take 3 tablets (450 mg total) by mouth daily.  . finasteride (PROSCAR) 5 MG tablet Take 1 tablet (5 mg total) by mouth daily.  . fluticasone (FLONASE) 50 MCG/ACT nasal spray Place 2 sprays into both nostrils daily.  [name] Kitchen glycopyrrolate (ROBINUL) 1 MG tablet Take 1 tablet (1 mg total) by mouth 3 (three) times daily.  [name] Kitchen losartan (COZAAR) 50 MG tablet TAKE 1 TABLET BY MOUTH ONCE DAILY  . omeprazole (PRILOSEC) 40 MG capsule TAKE  1 CAPSULE BY MOUTH 2 TIMES DAILY.  . pravastatin (PRAVACHOL) 20 MG tablet Take 1 tablet (20 mg total) by mouth daily.  . traMADol (ULTRAM) 50 MG tablet Take 1 tablet (50 mg total) by mouth every 6 (six) hours as needed for moderate pain.  . traZODone (DESYREL) 50 MG tablet TAKE 1 TABLET BY MOUTH AT BEDTIME  . VIAGRA 100 MG tablet TAKE 1/2-1 TABLET BY MOUTH DAILY AS NEEDED FOR ERECTILE DYSFUNCTION     Allergies:   Hydrocodone and Penicillins   Social History   Socioeconomic History  . Marital status: Married    Spouse name: Marlowe Kays Hinesley  . Number of children: 4  . Years of education: 57  . Highest education level: Not on file   Occupational History  . Occupation: Machinist-retired 2015    Employer: [employer]    Comment: [employer]  . Occupation: light work at his [employer]  Social Needs  . Financial resource strain: Not on file  . Food insecurity    Worry: Not on file    Inability: Not on file  . Transportation needs    Medical: Not on file    Non-medical: Not on file  Tobacco Use  . Smoking status: Never Smoker  . Smokeless tobacco: Never Used  Substance and Sexual Activity  . Alcohol use: Yes    Alcohol/week: 3.0 standard drinks    Types: 3 Cans of beer per week  . Drug use: No  . Sexual activity: Not on file  Lifestyle  . Physical activity    Days per week: Not on file    Minutes per session: Not on file  . Stress: Not on file  Relationships  . Social Herbalist on phone: Not on file    Gets together: Not on file    Attends religious service: Not on file    Active member of club or organization: Not on file    Attends meetings of clubs or organizations: Not on file    Relationship status: Not on file  Other Topics Concern  . Not on file  Social History Narrative   Lives with his wife.  Three children are grown.  Younger daughter will graduate from [employer] in 01/2013.  Both sons and oldest daughter are in [place]. Exercises regularly.   His wife retired 12/2015 and is enjoying spending time in the yard, and with their family.     Family History: The patient's family history includes Arthritis in his brother; Cancer in his brother; Emphysema in his father; Heart disease in his mother; Lung cancer (age of onset: 86) in his brother.  ROS:   Please see the history of present illness.    No fevers chills nausea vomiting syncope all other systems reviewed and are negative.  EKGs/Labs/Other Studies Reviewed:    The following studies were reviewed today: No new studies  EKG:  EKG is  ordered today.  The ekg ordered today demonstrates 07/12/2019-sinus rhythm 62 no other  abnormalities.  Recent Labs: 05/29/2019: ALT 12; BUN 16; Creatinine, Ser 1.13; Hemoglobin 12.4; Platelets 173.0; Potassium 4.0; Sodium 138  Recent Lipid Panel    Component Value Date/Time   CHOL 158 05/29/2019 0911   TRIG 79.0 05/29/2019 0911   HDL 38.20 (L) 05/29/2019 0911   CHOLHDL 4 05/29/2019 0911   VLDL 15.8 05/29/2019 0911   LDLCALC 104 (H) 05/29/2019 0911    Physical Exam:    VS:  BP 124/70   Pulse 62  Ht 6\' 3"  (1.905 m)   Wt 211 lb (95.7 kg)   BMI 26.37 kg/m     Wt Readings from Last 3 Encounters:  07/12/19 211 lb (95.7 kg)  05/29/19 213 lb (96.6 kg)  12/24/18 217 lb (98.4 kg)     GEN:  Well nourished, well developed in no acute distress HEENT: Normal NECK: No JVD; No carotid bruits LYMPHATICS: No lymphadenopathy CARDIAC: RRR, no murmurs, rubs, gallops RESPIRATORY:  Clear to auscultation without rales, wheezing or rhonchi  ABDOMEN: Soft, non-tender, non-distended MUSCULOSKELETAL:  No edema; No deformity  SKIN: Warm and dry NEUROLOGIC:  Alert and oriented x 3 PSYCHIATRIC:  Normal affect   ASSESSMENT:    1. Essential hypertension   2. Pure hypercholesterolemia    PLAN:    In order of problems listed above:  Essential hypertension -Labile.  Overall reasonably controlled.  Sometimes anxiety plays a component.  No changes made.  Hyperlipidemia -Statin.  Lab work checked by PCP.   Medication Adjustments/Labs and Tests Ordered: Current medicines are reviewed at length with the patient today.  Concerns regarding medicines are outlined above.  Orders Placed This Encounter  Procedures  . EKG 12-Lead   No orders of the defined types were placed in this encounter.   Patient Instructions  Medication Instructions:  The current medical regimen is effective;  continue present plan and medications.  *If you need a refill on your cardiac medications before your next appointment, please call your pharmacy*  Follow-Up: At [facility], you and your  health needs are our priority.  As part of our continuing mission to provide you with exceptional heart care, we have created designated Provider Care Teams.  These Care Teams include your primary Cardiologist (physician) and Advanced Practice Providers (APPs -  Physician Assistants and Nurse Practitioners) who all work together to provide you with the care you need, when you need it.  Your next appointment:   12 months  The format for your next appointment:   In Person  Provider:   You may see Candee Furbish, MD or one of the following Advanced Practice Providers on your designated Care Team:    Truitt Merle, NP  Cecilie Kicks, NP  Kathyrn Drown, NP   Thank you for choosing [facility]!!         Signed, Candee Furbish, MD  07/12/2019 2:01 PM    [place]

## 2019-07-17 ENCOUNTER — Other Ambulatory Visit: Payer: Self-pay | Admitting: Orthopedic Surgery

## 2019-07-23 ENCOUNTER — Encounter (HOSPITAL_BASED_OUTPATIENT_CLINIC_OR_DEPARTMENT_OTHER): Payer: Self-pay | Admitting: *Deleted

## 2019-07-23 ENCOUNTER — Other Ambulatory Visit: Payer: Self-pay

## 2019-07-23 NOTE — Progress Notes (Signed)
Spoke with wife connie, reminded her that the patient is required to have a Covid test prior to surgery. She verbalized understanding and I will try to have someone from [facility] call back at 4pm today as requested to do pre op call.

## 2019-07-26 ENCOUNTER — Other Ambulatory Visit (HOSPITAL_COMMUNITY): Admission: RE | Admit: 2019-07-26 | Payer: Medicare Other | Source: Ambulatory Visit

## 2019-07-27 ENCOUNTER — Other Ambulatory Visit (HOSPITAL_COMMUNITY)
Admission: RE | Admit: 2019-07-27 | Discharge: 2019-07-27 | Disposition: A | Discharge status: Home / Self Care | Payer: Medicare Other | Source: Ambulatory Visit | Attending: Orthopedic Surgery | Admitting: Orthopedic Surgery

## 2019-07-27 DIAGNOSIS — Z20828 Contact with and (suspected) exposure to other viral communicable diseases: Secondary | ICD-10-CM | POA: Diagnosis not present

## 2019-07-27 DIAGNOSIS — Z01812 Encounter for preprocedural laboratory examination: Secondary | ICD-10-CM | POA: Diagnosis not present

## 2019-07-28 LAB — SARS CORONAVIRUS 2 (TAT 6-24 HRS): SARS Coronavirus 2: NEGATIVE

## 2019-07-30 ENCOUNTER — Encounter (HOSPITAL_BASED_OUTPATIENT_CLINIC_OR_DEPARTMENT_OTHER)
Admission: RE | Disposition: A | Discharge status: Home / Self Care | Payer: Self-pay | Source: Home / Self Care | Attending: Orthopedic Surgery

## 2019-07-30 ENCOUNTER — Other Ambulatory Visit: Payer: Self-pay

## 2019-07-30 ENCOUNTER — Ambulatory Visit (HOSPITAL_BASED_OUTPATIENT_CLINIC_OR_DEPARTMENT_OTHER): Payer: Medicare Other | Admitting: Certified Registered"

## 2019-07-30 ENCOUNTER — Encounter (HOSPITAL_BASED_OUTPATIENT_CLINIC_OR_DEPARTMENT_OTHER): Payer: Self-pay

## 2019-07-30 ENCOUNTER — Ambulatory Visit (HOSPITAL_BASED_OUTPATIENT_CLINIC_OR_DEPARTMENT_OTHER)
Admission: RE | Admit: 2019-07-30 | Discharge: 2019-07-30 | Disposition: A | Discharge status: Home / Self Care | Payer: Medicare Other | Attending: Orthopedic Surgery | Admitting: Orthopedic Surgery

## 2019-07-30 DIAGNOSIS — Z8261 Family history of arthritis: Secondary | ICD-10-CM | POA: Insufficient documentation

## 2019-07-30 DIAGNOSIS — Z8249 Family history of ischemic heart disease and other diseases of the circulatory system: Secondary | ICD-10-CM | POA: Diagnosis not present

## 2019-07-30 DIAGNOSIS — I10 Essential (primary) hypertension: Secondary | ICD-10-CM | POA: Diagnosis not present

## 2019-07-30 DIAGNOSIS — Z880 Allergy status to penicillin: Secondary | ICD-10-CM | POA: Diagnosis not present

## 2019-07-30 DIAGNOSIS — M160 Bilateral primary osteoarthritis of hip: Secondary | ICD-10-CM | POA: Diagnosis not present

## 2019-07-30 DIAGNOSIS — Z9049 Acquired absence of other specified parts of digestive tract: Secondary | ICD-10-CM | POA: Diagnosis not present

## 2019-07-30 DIAGNOSIS — F419 Anxiety disorder, unspecified: Secondary | ICD-10-CM | POA: Insufficient documentation

## 2019-07-30 DIAGNOSIS — Z801 Family history of malignant neoplasm of trachea, bronchus and lung: Secondary | ICD-10-CM | POA: Insufficient documentation

## 2019-07-30 DIAGNOSIS — J3800 Paralysis of vocal cords and larynx, unspecified: Secondary | ICD-10-CM | POA: Diagnosis not present

## 2019-07-30 DIAGNOSIS — Z8589 Personal history of malignant neoplasm of other organs and systems: Secondary | ICD-10-CM | POA: Insufficient documentation

## 2019-07-30 DIAGNOSIS — F329 Major depressive disorder, single episode, unspecified: Secondary | ICD-10-CM | POA: Diagnosis not present

## 2019-07-30 DIAGNOSIS — E785 Hyperlipidemia, unspecified: Secondary | ICD-10-CM | POA: Diagnosis not present

## 2019-07-30 DIAGNOSIS — Z825 Family history of asthma and other chronic lower respiratory diseases: Secondary | ICD-10-CM | POA: Diagnosis not present

## 2019-07-30 DIAGNOSIS — K219 Gastro-esophageal reflux disease without esophagitis: Secondary | ICD-10-CM | POA: Diagnosis not present

## 2019-07-30 DIAGNOSIS — N400 Enlarged prostate without lower urinary tract symptoms: Secondary | ICD-10-CM | POA: Diagnosis not present

## 2019-07-30 DIAGNOSIS — Z885 Allergy status to narcotic agent status: Secondary | ICD-10-CM | POA: Insufficient documentation

## 2019-07-30 DIAGNOSIS — L602 Onychogryphosis: Secondary | ICD-10-CM | POA: Diagnosis not present

## 2019-07-30 DIAGNOSIS — F418 Other specified anxiety disorders: Secondary | ICD-10-CM | POA: Diagnosis not present

## 2019-07-30 DIAGNOSIS — Z9689 Presence of other specified functional implants: Secondary | ICD-10-CM | POA: Insufficient documentation

## 2019-07-30 DIAGNOSIS — Z96642 Presence of left artificial hip joint: Secondary | ICD-10-CM | POA: Insufficient documentation

## 2019-07-30 DIAGNOSIS — Z809 Family history of malignant neoplasm, unspecified: Secondary | ICD-10-CM | POA: Insufficient documentation

## 2019-07-30 DIAGNOSIS — M170 Bilateral primary osteoarthritis of knee: Secondary | ICD-10-CM | POA: Diagnosis not present

## 2019-07-30 HISTORY — PX: DIGIT NAIL REMOVAL: SHX5052

## 2019-07-30 HISTORY — DX: Anxiety disorder, unspecified: F41.9

## 2019-07-30 HISTORY — DX: Other complications of anesthesia, initial encounter: T88.59XA

## 2019-07-30 SURGERY — REMOVAL, DIGIT NAIL
Anesthesia: Regional | Site: Finger | Laterality: Left

## 2019-07-30 MED ORDER — BUPIVACAINE HCL (PF) 0.25 % IJ SOLN
INTRAMUSCULAR | Status: DC | PRN
  Administered 2019-07-30: 10 mL

## 2019-07-30 MED ORDER — ONDANSETRON HCL 4 MG/2ML IJ SOLN
INTRAMUSCULAR | Status: AC
  Filled 2019-07-30: qty 12

## 2019-07-30 MED ORDER — PROPOFOL 500 MG/50ML IV EMUL
INTRAVENOUS | Status: DC | PRN
  Administered 2019-07-30: 100 ug/kg/min via INTRAVENOUS

## 2019-07-30 MED ORDER — ONDANSETRON HCL 4 MG/2ML IJ SOLN
INTRAMUSCULAR | Status: DC | PRN
  Administered 2019-07-30: 4 mg via INTRAVENOUS

## 2019-07-30 MED ORDER — OXYCODONE HCL 5 MG/5ML PO SOLN: 5.0000 mg | Freq: Once | ORAL | Status: AC | PRN

## 2019-07-30 MED ORDER — VANCOMYCIN HCL IN DEXTROSE 1-5 GM/200ML-% IV SOLN
1000.0000 mg | INTRAVENOUS | Status: AC
  Administered 2019-07-30: 1000 mg via INTRAVENOUS

## 2019-07-30 MED ORDER — BUPIVACAINE HCL (PF) 0.25 % IJ SOLN
INTRAMUSCULAR | Status: AC
  Filled 2019-07-30: qty 30

## 2019-07-30 MED ORDER — LIDOCAINE HCL (PF) 0.5 % IJ SOLN
INTRAMUSCULAR | Status: DC | PRN
  Administered 2019-07-30: 30 mL via INTRAVENOUS

## 2019-07-30 MED ORDER — FENTANYL CITRATE (PF) 100 MCG/2ML IJ SOLN
INTRAMUSCULAR | Status: AC
  Filled 2019-07-30: qty 2

## 2019-07-30 MED ORDER — CHLORHEXIDINE GLUCONATE 4 % EX LIQD: 60.0000 mL | Freq: Once | CUTANEOUS | Status: DC

## 2019-07-30 MED ORDER — TRAMADOL HCL 50 MG PO TABS: 50.0000 mg | ORAL_TABLET | Freq: Four times a day (QID) | ORAL | 0 refills | Status: DC | PRN

## 2019-07-30 MED ORDER — ONDANSETRON HCL 4 MG/2ML IJ SOLN: 4.0000 mg | Freq: Once | INTRAMUSCULAR | Status: DC | PRN

## 2019-07-30 MED ORDER — VANCOMYCIN HCL IN DEXTROSE 1-5 GM/200ML-% IV SOLN
INTRAVENOUS | Status: AC
  Filled 2019-07-30: qty 200

## 2019-07-30 MED ORDER — LACTATED RINGERS IV SOLN
INTRAVENOUS | Status: DC
  Administered 2019-07-30: 08:00:00 via INTRAVENOUS

## 2019-07-30 MED ORDER — LIDOCAINE 2% (20 MG/ML) 5 ML SYRINGE
INTRAMUSCULAR | Status: AC
  Filled 2019-07-30: qty 20

## 2019-07-30 MED ORDER — FENTANYL CITRATE (PF) 100 MCG/2ML IJ SOLN
50.0000 ug | INTRAMUSCULAR | Status: DC | PRN
  Administered 2019-07-30: 50 ug via INTRAVENOUS

## 2019-07-30 MED ORDER — OXYCODONE HCL 5 MG PO TABS
ORAL_TABLET | ORAL | Status: AC
  Filled 2019-07-30: qty 1

## 2019-07-30 MED ORDER — DEXAMETHASONE SODIUM PHOSPHATE 10 MG/ML IJ SOLN
INTRAMUSCULAR | Status: AC
  Filled 2019-07-30: qty 1

## 2019-07-30 MED ORDER — MIDAZOLAM HCL 2 MG/2ML IJ SOLN: 1.0000 mg | INTRAMUSCULAR | Status: DC | PRN

## 2019-07-30 MED ORDER — FENTANYL CITRATE (PF) 100 MCG/2ML IJ SOLN
25.0000 ug | INTRAMUSCULAR | Status: DC | PRN
  Administered 2019-07-30 (×2): 50 ug via INTRAVENOUS

## 2019-07-30 MED ORDER — OXYCODONE HCL 5 MG PO TABS
5.0000 mg | ORAL_TABLET | Freq: Once | ORAL | Status: AC | PRN
  Administered 2019-07-30: 5 mg via ORAL

## 2019-07-30 MED FILL — traMADol HCL 50 MG TABS: 50 | 5 days supply | Qty: 20 | Fill #0

## 2019-07-30 SURGICAL SUPPLY — 36 items
APL PRP STRL LF DISP 70% ISPRP (MISCELLANEOUS) ×1
BLADE MINI RND TIP GREEN BEAV (BLADE) ×1 IMPLANT
BLADE SURG 15 STRL LF DISP TIS (BLADE) ×1 IMPLANT
BLADE SURG 15 STRL SS (BLADE) ×2
BNDG CMPR 9X4 STRL LF SNTH (GAUZE/BANDAGES/DRESSINGS) ×1
BNDG COHESIVE 1X5 TAN STRL LF (GAUZE/BANDAGES/DRESSINGS) ×1 IMPLANT
BNDG ESMARK 4X9 LF (GAUZE/BANDAGES/DRESSINGS) ×1 IMPLANT
CHLORAPREP W/TINT 26 (MISCELLANEOUS) ×2 IMPLANT
CORD BIPOLAR FORCEPS 12FT (ELECTRODE) ×2 IMPLANT
COVER BACK TABLE REUSABLE LG (DRAPES) ×2 IMPLANT
COVER MAYO STAND REUSABLE (DRAPES) ×2 IMPLANT
COVER WAND RF STERILE (DRAPES) IMPLANT
CUFF TOURN SGL QUICK 18X4 (TOURNIQUET CUFF) ×1 IMPLANT
DRAPE EXTREMITY T 121X128X90 (DISPOSABLE) ×2 IMPLANT
GAUZE SPONGE 4X4 12PLY STRL (GAUZE/BANDAGES/DRESSINGS) ×2 IMPLANT
GAUZE XEROFORM 1X8 LF (GAUZE/BANDAGES/DRESSINGS) ×2 IMPLANT
GLOVE BIO SURGEON STRL SZ 6.5 (GLOVE) ×2 IMPLANT
GLOVE SURG ORTHO 8.0 STRL STRW (GLOVE) ×2 IMPLANT
GOWN STRL REUS W/ TWL LRG LVL3 (GOWN DISPOSABLE) ×1 IMPLANT
GOWN STRL REUS W/TWL LRG LVL3 (GOWN DISPOSABLE) ×2
GOWN STRL REUS W/TWL XL LVL3 (GOWN DISPOSABLE) ×2 IMPLANT
NDL PRECISIONGLIDE 27X1.5 (NEEDLE) IMPLANT
NEEDLE PRECISIONGLIDE 27X1.5 (NEEDLE) ×2 IMPLANT
NS IRRIG 1000ML POUR BTL (IV SOLUTION) ×2 IMPLANT
PACK BASIN DAY SURGERY FS (CUSTOM PROCEDURE TRAY) ×2 IMPLANT
PADDING CAST ABS 4INX4YD NS (CAST SUPPLIES) ×1
PADDING CAST ABS COTTON 4X4 ST (CAST SUPPLIES) ×1 IMPLANT
SPLINT FINGER 3.25 BULB 911905 (SOFTGOODS) ×1 IMPLANT
STOCKINETTE 4X48 STRL (DRAPES) ×2 IMPLANT
SUT CHROMIC 4 0 PS 2 18 (SUTURE) ×1 IMPLANT
SUT CHROMIC 6 0 G 1 (SUTURE) IMPLANT
SUT ETHILON 4 0 PS 2 18 (SUTURE) ×2 IMPLANT
SYR BULB 3OZ (MISCELLANEOUS) ×2 IMPLANT
SYR CONTROL 10ML LL (SYRINGE) ×1 IMPLANT
TOWEL GREEN STERILE FF (TOWEL DISPOSABLE) ×4 IMPLANT
UNDERPAD 30X36 HEAVY ABSORB (UNDERPADS AND DIAPERS) ×2 IMPLANT

## 2019-07-30 NOTE — Anesthesia Preprocedure Evaluation (Addendum)
Anesthesia Evaluation  Patient identified by MRN, date of birth, ID band Patient awake    Reviewed: Allergy & Precautions, NPO status , Patient's Chart, lab work & pertinent test results  History of Anesthesia Complications Negative for: history of anesthetic complications  Airway Mallampati: II  TM Distance: >3 FB Neck ROM: Full    Dental  (+) Teeth Intact   Pulmonary neg pulmonary ROS,    Pulmonary exam normal        Cardiovascular hypertension, Pt. on medications Normal cardiovascular exam     Neuro/Psych PSYCHIATRIC DISORDERS Anxiety Depression negative neurological ROS     GI/Hepatic Neg liver ROS, GERD  Medicated,  Endo/Other  negative endocrine ROS  Renal/GU negative Renal ROS  negative genitourinary   Musculoskeletal negative musculoskeletal ROS (+)   Abdominal   Peds  Hematology negative hematology ROS (+)   Anesthesia Other Findings Right vocal cord paralysis  Reproductive/Obstetrics                            Anesthesia Physical Anesthesia Plan  ASA: II  Anesthesia Plan: Bier Block and Bier Block-LIDOCAINE ONLY   Post-op Pain Management:    Induction: Intravenous  PONV Risk Score and Plan: 1 and Treatment may vary due to age or medical condition and Propofol infusion  Airway Management Planned: Natural Airway, Nasal Cannula and Simple Face Mask  Additional Equipment: None  Intra-op Plan:   Post-operative Plan:   Informed Consent: I have reviewed the patients History and Physical, chart, labs and discussed the procedure including the risks, benefits and alternatives for the proposed anesthesia with the patient or authorized representative who has indicated his/her understanding and acceptance.       Plan Discussed with:   Anesthesia Plan Comments:        Anesthesia Quick Evaluation

## 2019-07-30 NOTE — Anesthesia Procedure Notes (Signed)
Procedure Name: MAC Date/Time: 07/30/2019 8:44 AM Performed by: Signe Colt, CRNA Pre-anesthesia Checklist: Patient identified, Emergency Drugs available, Suction available, Patient being monitored and Timeout performed Patient Re-evaluated:Patient Re-evaluated prior to induction Oxygen Delivery Method: Simple face mask

## 2019-07-30 NOTE — H&P (Signed)
Chris Welch is an 73 y.o. male.   Chief Complaint: Nail horns  HPI: Chris Welch is a 73 year old male who sustained a joiner planer injury to his left ring finger.  The injury occurred on 02/27/2019.  He underwent a revision of the amputation at that time.  Attempted ablation of the nail matrix was performed.  He has done well following this however has developed small nail horns which are aggravating for him.  He has no prior history of injury.  Past medical history is negative for diabetes thyroid problems and gout.  He has history of arthritis.  Past Medical History:  Diagnosis Date  . Anxiety   . Arthritis    "hips, knees" (01/27/2016)  . Complication of anesthesia    bad dreams  . Depression   . Diverticulitis   . Diverticulosis   . Ear infection ear infection lasting past 2 months, still ongoing, pt on antibiotics & drops. pt states "ear infection has eaten holes through bilateral ear drums,  I am hard of hearing"  . Enlarged prostate   . Esophageal stricture   . Gastropathy    reactive  . GERD (gastroesophageal reflux disease)   . Hard of hearing   . Hyperlipidemia   . Hypertension   . 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 Pneumonia    x 3, last 2009 (01/27/2016)  . 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 Vocal cord paralysis    Right    Past Surgical History:  Procedure Laterality Date  . APPENDECTOMY    . BACK SURGERY    . CAROTID BODY TUMOR EXCISION    . CERVICAL PARAGANGLIOMA EXCISION Right 12/2006  . CHOLECYSTECTOMY OPEN    . DEEP NECK LYMPH NODE BIOPSY / EXCISION    . ESOPHAGOGASTRODUODENOSCOPY (EGD) WITH ESOPHAGEAL DILATION    . INGUINAL HERNIA REPAIR Right 1954  . JOINT REPLACEMENT    . KNEE ARTHROSCOPY Right   . LUMBAR LAMINECTOMY/DECOMPRESSION MICRODISCECTOMY Left 03/09/2013   Procedure: LUMBAR LAMINECTOMY/DECOMPRESSION MICRODISCECTOMY 1 LEVEL;  Surgeon: Chris Moore, MD;  Location: [place] [facility];  Service: Neurosurgery;   Laterality: Left;  Left lumbar three-four extra-foraminal microdiscectomy  . PATELLA ARTHROPLASTY Right    has had 5 surgeries right knee  . TOTAL HIP ARTHROPLASTY Left 01/27/2016  . TOTAL HIP ARTHROPLASTY Left 01/27/2016   Procedure: TOTAL HIP ARTHROPLASTY ANTERIOR APPROACH;  Surgeon: Chris Pear, MD;  Location: [place];  Service: Orthopedics;  Laterality: Left;  [name] Kitchen VOCAL CORD IMPLANT      Family History  Problem Relation Age of Onset  . Arthritis Brother        back  . Lung cancer Brother 18        (dx to death: 30 days)  . Cancer Brother        rare cancer-unknown type  . Heart disease Mother   . Emphysema Father    Social History:  reports that he has never smoked. He has never used smokeless tobacco. He reports current alcohol use of about 3.0 standard drinks of alcohol per week. He reports that he does not use drugs.  Allergies:  Allergies  Allergen Reactions  . Hydrocodone Nausea And Vomiting    "can not take on an empty stomach"  . Penicillins Other (See Comments) and Rash    Ulcers on eyes    No medications prior to admission.    No results found for this or any previous visit (from the  past 48 hour(s)).  No results found.   Pertinent items are noted in HPI.  Height 6\' 3"  (1.905 m), weight 95 kg.  General appearance: alert, cooperative and appears stated age Head: Normocephalic, without obvious abnormality Neck: no JVD Resp: clear to auscultation bilaterally Cardio: regular rate and rhythm, S1, S2 normal, no murmur, click, rub or gallop GI: soft, non-tender; bowel sounds normal; no masses,  no organomegaly  Extremities: Left ring finger Pulses: 2+ and symmetric Skin: Skin color, texture, turgor normal. No rashes or lesions Neurologic: Grossly normal Incision/Wound: na  Assessment/Plan DX; nail horn regrowth.  PLAN: I would recommend a removal of the horn on his ulnar aspect. This will be scheduled as an outpatient under regional anesthesia. We will look  on the radial side to be certain that the nail matrix on that side is also been entirely removed. In agreement with this this will be scheduled as an outpatient under regional anesthesia.   Chris Welch 07/30/2019, 5:44 AM

## 2019-07-30 NOTE — Op Note (Signed)
NAME: Shawndre Peel Short MEDICAL RECORD NO: [id number] DATE OF BIRTH: 04/02/1946 FACILITY: [facility] LOCATION: [facility] PHYSICIAN: Wynonia Sours, MD   OPERATIVE REPORT   DATE OF PROCEDURE: 07/30/19    PREOPERATIVE DIAGNOSIS:   Nail horn left ring finger   POSTOPERATIVE DIAGNOSIS:   Same   PROCEDURE:   Excision nail horn left ring finger   SURGEON: Daryll Brod, M.D.   ASSISTANT: none   ANESTHESIA:  Bier block with sedation and Local   INTRAVENOUS FLUIDS:  Per anesthesia flow sheet.   ESTIMATED BLOOD LOSS:  Minimal.   COMPLICATIONS:  None.   SPECIMENS:  none   TOURNIQUET TIME:    Total Tourniquet Time Documented: Forearm (Left) - 22 minutes Total: Forearm (Left) - 22 minutes    DISPOSITION:  Stable to PACU.   INDICATIONS: Patient is a 73 year old male sustained a router injury to his left ring finger and underwent a revision amputation approximately 4 months ago has developed the nail horn which is painful for him.  Is elected to have this surgically excised.  Pre-peripostoperative course are discussed along with risks and complications.  He is aware there is no guarantee to the surgery possibility of infection recurrence injury to arteries nerves tendons incomplete relief symptoms dystrophy.  Preoperative area the patient is seen extremity marked by both patient and surgeon antibiotic given  OPERATIVE COURSE: Patient is brought to the operating room where form based IV regional anesthetic was carried out without difficulty under the direction the anesthesia department.  He was prepped and draped using ChloraPrep in the supine position with the left arm free.  A 3-minute dry time was allowed timeout taken confirming patient procedure.  A metacarpal block was given quarter percent bupivacaine without epinephrine approximately 8 to 9 cc was used.  The nail horn was then excised and with a elliptical incision taking care to remove any soft tissue about the nail  horn.  This is down to the bone.  The area was then cauterized with bipolar.  A small horn on the radial aspect of the digit was also removed and cauterized.  The wound was copious irrigated with saline.  The skin was closed interrupted 4-0 chromic sutures.  A sterile compressive dressing splint to the finger was applied.  Deflation of the tourniquet remaining fingers pink.  Was taken to the recovery room for observation in satisfactory condition.  He will be discharged home to return [place] 1 week on Tylenol ibuprofen for pain with Ultram as a backup for breakthrough.   Daryll Brod, MD Electronically signed, 07/30/19

## 2019-07-30 NOTE — Anesthesia Postprocedure Evaluation (Signed)
Anesthesia Post Note  Patient: Chris Welch  Procedure(s) Performed: REMOVAL OF NAIL HORNS LEFT RING FINGER (Left Finger)     Patient location during evaluation: PACU Anesthesia Type: Bier Block Level of consciousness: awake and alert Pain management: pain level controlled Vital Signs Assessment: post-procedure vital signs reviewed and stable Respiratory status: spontaneous breathing, nonlabored ventilation and respiratory function stable Cardiovascular status: blood pressure returned to baseline and stable Postop Assessment: no apparent nausea or vomiting Anesthetic complications: no    Last Vitals:  Vitals:   07/30/19 0945 07/30/19 1000  BP: 109/74 120/76  Pulse: (!) 54 71  Resp: (!) 21 16  Temp:  36.5 C  SpO2: 96% 97%    Last Pain:  Vitals:   07/30/19 1000  TempSrc:   PainSc: 4                  Lidia Collum

## 2019-07-30 NOTE — Transfer of Care (Signed)
Immediate Anesthesia Transfer of Care Note  Patient: Chris Welch  Procedure(s) Performed: REMOVAL OF NAIL HORNS LEFT RING FINGER (Left Finger)  Patient Location: PACU  Anesthesia Type:Bier block  Level of Consciousness: awake, drowsy and patient cooperative  Airway & Oxygen Therapy: Patient Spontanous Breathing and Patient connected to face mask oxygen  Post-op Assessment: Report given to RN and Post -op Vital signs reviewed and stable  Post vital signs: Reviewed and stable  Last Vitals:  Vitals Value Taken Time  BP    Temp    Pulse    Resp    SpO2      Last Pain:  Vitals:   07/30/19 0733  TempSrc: Oral  PainSc: 7       Patients Stated Pain Goal: 5 ([id number])  Complications: No apparent anesthesia complications

## 2019-07-30 NOTE — Discharge Instructions (Addendum)

## 2019-07-30 NOTE — Anesthesia Procedure Notes (Signed)
Anesthesia Regional Block: Bier block (IV Regional)   Pre-Anesthetic Checklist: ,, timeout performed, Correct Patient, Correct Site, Correct Laterality, Correct Procedure,, site marked, surgical consent,, at surgeon's request  Laterality: Left     Needles:  Injection technique: Single-shot  Needle Type: Other      Needle Gauge: 20     Additional Needles:   Procedures:,,,,, intact distal pulses, Esmarch exsanguination, single tourniquet utilized,  Narrative:   Performed by: Personally       

## 2019-07-30 NOTE — Brief Op Note (Signed)
07/30/2019  9:05 AM  PATIENT:  Chris Welch  73 y.o. male  PRE-OPERATIVE DIAGNOSIS:  NAIL HORN LEFT RING FINGER  POST-OPERATIVE DIAGNOSIS:  NAIL HORN LEFT RING FINGER  PROCEDURE:  Procedure(s) with comments: REMOVAL OF NAIL HORNS LEFT RING FINGER (Left) - IV REGIONAL FOREARM BLOCK  SURGEON:  Surgeon(s) and Role:    * Daryll Brod, MD - Primary  PHYSICIAN ASSISTANT:   ASSISTANTS: none   ANESTHESIA:   local, regional and IV sedation  EBL:  57ml  BLOOD ADMINISTERED:none  DRAINS: none   LOCAL MEDICATIONS USED:  BUPIVICAINE   SPECIMEN:  No Specimen  DISPOSITION OF SPECIMEN:  N/A  COUNTS:  YES  TOURNIQUET:   Total Tourniquet Time Documented: Forearm (Left) - 22 minutes Total: Forearm (Left) - 22 minutes   DICTATION: .Dragon Dictation  PLAN OF CARE: Discharge to home after PACU  PATIENT DISPOSITION:  PACU - hemodynamically stable.

## 2019-07-31 ENCOUNTER — Encounter (HOSPITAL_BASED_OUTPATIENT_CLINIC_OR_DEPARTMENT_OTHER): Payer: Self-pay | Admitting: Orthopedic Surgery

## 2019-08-02 DIAGNOSIS — D225 Melanocytic nevi of trunk: Secondary | ICD-10-CM | POA: Diagnosis not present

## 2019-08-02 DIAGNOSIS — L738 Other specified follicular disorders: Secondary | ICD-10-CM | POA: Diagnosis not present

## 2019-08-02 DIAGNOSIS — L821 Other seborrheic keratosis: Secondary | ICD-10-CM | POA: Diagnosis not present

## 2019-08-02 DIAGNOSIS — D2271 Melanocytic nevi of right lower limb, including hip: Secondary | ICD-10-CM | POA: Diagnosis not present

## 2019-08-02 DIAGNOSIS — L814 Other melanin hyperpigmentation: Secondary | ICD-10-CM | POA: Diagnosis not present

## 2019-08-02 DIAGNOSIS — L570 Actinic keratosis: Secondary | ICD-10-CM | POA: Diagnosis not present

## 2019-08-02 DIAGNOSIS — D692 Other nonthrombocytopenic purpura: Secondary | ICD-10-CM | POA: Diagnosis not present

## 2019-08-02 DIAGNOSIS — D1801 Hemangioma of skin and subcutaneous tissue: Secondary | ICD-10-CM | POA: Diagnosis not present

## 2019-08-02 DIAGNOSIS — Z85828 Personal history of other malignant neoplasm of skin: Secondary | ICD-10-CM | POA: Diagnosis not present

## 2019-09-11 DIAGNOSIS — H2513 Age-related nuclear cataract, bilateral: Secondary | ICD-10-CM | POA: Diagnosis not present

## 2019-09-11 DIAGNOSIS — H02834 Dermatochalasis of left upper eyelid: Secondary | ICD-10-CM | POA: Diagnosis not present

## 2019-09-11 DIAGNOSIS — H02831 Dermatochalasis of right upper eyelid: Secondary | ICD-10-CM | POA: Diagnosis not present

## 2019-09-11 DIAGNOSIS — H43813 Vitreous degeneration, bilateral: Secondary | ICD-10-CM | POA: Diagnosis not present

## 2019-10-23 ENCOUNTER — Other Ambulatory Visit: Payer: Self-pay | Admitting: Orthopedic Surgery

## 2019-10-23 DIAGNOSIS — L609 Nail disorder, unspecified: Secondary | ICD-10-CM | POA: Diagnosis not present

## 2019-10-23 DIAGNOSIS — S68115A Complete traumatic metacarpophalangeal amputation of left ring finger, initial encounter: Secondary | ICD-10-CM

## 2019-11-01 ENCOUNTER — Inpatient Hospital Stay: Admission: RE | Admit: 2019-11-01 | Payer: Medicare Other | Source: Ambulatory Visit

## 2019-11-03 ENCOUNTER — Ambulatory Visit: Payer: Medicare Other | Attending: Internal Medicine

## 2019-11-03 DIAGNOSIS — Z23 Encounter for immunization: Secondary | ICD-10-CM | POA: Insufficient documentation

## 2019-11-03 NOTE — Progress Notes (Signed)
   Covid-19 Vaccination Clinic  Name:  Chris Welch    MRN: [id number] DOB: 1946/02/05  11/03/2019  [name] was observed post Covid-19 immunization for 15 minutes without incidence. He was provided with Vaccine Information Sheet and instruction to access the V-Safe system.   [name] was instructed to call 911 with any severe reactions post vaccine: [name] Kitchen Difficulty breathing  . Swelling of your face and throat  . A fast heartbeat  . A bad rash all over your body  . Dizziness and weakness    Immunizations Administered    Name Date Dose VIS Date Route   Pfizer COVID-19 Vaccine 11/03/2019  8:31 AM 0.3 mL 08/30/2019 Intramuscular   Manufacturer: [place]   Lot: [id number]   [place]: [id number]

## 2019-11-25 ENCOUNTER — Ambulatory Visit: Payer: Medicare Other | Attending: Internal Medicine

## 2019-11-25 DIAGNOSIS — Z23 Encounter for immunization: Secondary | ICD-10-CM | POA: Insufficient documentation

## 2019-11-25 NOTE — Progress Notes (Signed)
   Covid-19 Vaccination Clinic  Name:  Chris Welch    MRN: [id number] DOB: 09-11-1946  11/25/2019  Chris Welch was observed post Covid-19 immunization for 15 minutes without incident. He was provided with Vaccine Information Sheet and instruction to access the V-Safe system.   Chris Welch was instructed to call 911 with any severe reactions post vaccine: [name] Kitchen Difficulty breathing  . Swelling of face and throat  . A fast heartbeat  . A bad rash all over body  . Dizziness and weakness   Immunizations Administered    Name Date Dose VIS Date Route   Pfizer COVID-19 Vaccine 11/25/2019  8:25 AM 0.3 mL 08/30/2019 Intramuscular   Manufacturer: [place]   Lot: [id number]   [place]: [id number]

## 2019-11-29 ENCOUNTER — Other Ambulatory Visit: Payer: Self-pay | Admitting: Orthopedic Surgery

## 2019-11-29 ENCOUNTER — Ambulatory Visit
Admission: RE | Admit: 2019-11-29 | Discharge: 2019-11-29 | Disposition: A | Discharge status: Home / Self Care | Payer: Medicare Other | Source: Ambulatory Visit | Attending: Orthopedic Surgery | Admitting: Orthopedic Surgery

## 2019-11-29 ENCOUNTER — Other Ambulatory Visit: Payer: Self-pay

## 2019-11-29 DIAGNOSIS — S68115A Complete traumatic metacarpophalangeal amputation of left ring finger, initial encounter: Secondary | ICD-10-CM

## 2019-11-29 DIAGNOSIS — T8789 Other complications of amputation stump: Secondary | ICD-10-CM | POA: Diagnosis not present

## 2019-12-09 DIAGNOSIS — S68115A Complete traumatic metacarpophalangeal amputation of left ring finger, initial encounter: Secondary | ICD-10-CM | POA: Diagnosis not present

## 2019-12-10 ENCOUNTER — Other Ambulatory Visit: Payer: Self-pay

## 2019-12-10 ENCOUNTER — Telehealth: Payer: Self-pay | Admitting: *Deleted

## 2019-12-10 ENCOUNTER — Other Ambulatory Visit: Payer: Self-pay | Admitting: Orthopedic Surgery

## 2019-12-10 ENCOUNTER — Encounter (HOSPITAL_BASED_OUTPATIENT_CLINIC_OR_DEPARTMENT_OTHER): Payer: Self-pay | Admitting: Orthopedic Surgery

## 2019-12-10 NOTE — Telephone Encounter (Signed)
Left message to call back and ask to speak with pre-op team. 

## 2019-12-10 NOTE — Telephone Encounter (Signed)
   [facility] Pre-operative Risk Assessment    Request for surgical clearance:  1. What type of surgery is being performed? EXCISION OF CYST LEFT RING FINGER, EXCISION OF NAIL HORN REMNANT   2. When is this surgery scheduled? 12/17/19   3. What type of clearance is required (medical clearance vs. Pharmacy clearance to hold med vs. Both)? MEDICAL  4. Are there any medications that need to be held prior to surgery and how long? ASA   5. Practice name and name of physician performing surgery? [facility] [facility]; DR. [place]   6. What is your office phone number [phone]    7.   What is your office fax number [phone]  8.   Anesthesia type (None, local, MAC, general) ? IV REGIONAL FOREARM BLOCK   Julaine Hua 12/10/2019, 11:43 AM  _________________________________________________________________   (provider comments below)

## 2019-12-13 ENCOUNTER — Other Ambulatory Visit (HOSPITAL_COMMUNITY): Payer: Medicare Other

## 2019-12-13 NOTE — Telephone Encounter (Signed)
Patient returning call.

## 2019-12-13 NOTE — Progress Notes (Signed)

## 2019-12-13 NOTE — Telephone Encounter (Signed)
   Primary Cardiologist: Candee Furbish, MD  Chart reviewed as part of pre-operative protocol coverage. Patient was contacted 12/13/2019 in reference to pre-operative risk assessment for pending surgery as outlined below.  Chris Welch was last seen on 07/12/2019 by Dr. Marlou Porch.  Since that day, Chris Welch has done well with no new cardiac coplaints. He has no known history of CAD, MI or cardiac interventions.  It is OK to hold aspirin as needed.   Therefore, based on ACC/AHA guidelines, the patient would be at acceptable risk for the planned procedure without further cardiovascular testing.   I will route this recommendation to the requesting party via Epic fax function and remove from pre-op pool.  Please call with questions.  Daune Perch, NP 12/13/2019, 11:26 AM

## 2019-12-13 NOTE — Telephone Encounter (Signed)
Chris Welch. I have someone from the [facility] calling about the status of the clearance for Chris Welch MRN: [id number]. Would you be able to take the call?  I returned call to the [facility] and confirmed that we had sent over clearance at 11:26 am 12/13/19 today.

## 2019-12-14 ENCOUNTER — Other Ambulatory Visit (HOSPITAL_COMMUNITY)
Admission: RE | Admit: 2019-12-14 | Discharge: 2019-12-14 | Disposition: A | Discharge status: Home / Self Care | Payer: Medicare Other | Source: Ambulatory Visit | Attending: Orthopedic Surgery | Admitting: Orthopedic Surgery

## 2019-12-14 DIAGNOSIS — Z20822 Contact with and (suspected) exposure to covid-19: Secondary | ICD-10-CM | POA: Insufficient documentation

## 2019-12-14 DIAGNOSIS — Z01812 Encounter for preprocedural laboratory examination: Secondary | ICD-10-CM | POA: Insufficient documentation

## 2019-12-14 LAB — SARS CORONAVIRUS 2 (TAT 6-24 HRS): SARS Coronavirus 2: NEGATIVE

## 2019-12-17 ENCOUNTER — Ambulatory Visit (HOSPITAL_BASED_OUTPATIENT_CLINIC_OR_DEPARTMENT_OTHER): Payer: Medicare Other | Admitting: Anesthesiology

## 2019-12-17 ENCOUNTER — Encounter (HOSPITAL_BASED_OUTPATIENT_CLINIC_OR_DEPARTMENT_OTHER)
Admission: RE | Disposition: A | Discharge status: Home / Self Care | Payer: Self-pay | Source: Home / Self Care | Attending: Orthopedic Surgery

## 2019-12-17 ENCOUNTER — Other Ambulatory Visit: Payer: Self-pay

## 2019-12-17 ENCOUNTER — Encounter (HOSPITAL_BASED_OUTPATIENT_CLINIC_OR_DEPARTMENT_OTHER): Payer: Self-pay | Admitting: Orthopedic Surgery

## 2019-12-17 ENCOUNTER — Ambulatory Visit (HOSPITAL_BASED_OUTPATIENT_CLINIC_OR_DEPARTMENT_OTHER)
Admission: RE | Admit: 2019-12-17 | Discharge: 2019-12-17 | Disposition: A | Discharge status: Home / Self Care | Payer: Medicare Other | Attending: Orthopedic Surgery | Admitting: Orthopedic Surgery

## 2019-12-17 DIAGNOSIS — Z79899 Other long term (current) drug therapy: Secondary | ICD-10-CM | POA: Insufficient documentation

## 2019-12-17 DIAGNOSIS — L602 Onychogryphosis: Secondary | ICD-10-CM | POA: Diagnosis not present

## 2019-12-17 DIAGNOSIS — Z96642 Presence of left artificial hip joint: Secondary | ICD-10-CM | POA: Diagnosis not present

## 2019-12-17 DIAGNOSIS — M170 Bilateral primary osteoarthritis of knee: Secondary | ICD-10-CM | POA: Diagnosis not present

## 2019-12-17 DIAGNOSIS — K219 Gastro-esophageal reflux disease without esophagitis: Secondary | ICD-10-CM | POA: Diagnosis not present

## 2019-12-17 DIAGNOSIS — Z885 Allergy status to narcotic agent status: Secondary | ICD-10-CM | POA: Insufficient documentation

## 2019-12-17 DIAGNOSIS — L720 Epidermal cyst: Secondary | ICD-10-CM | POA: Insufficient documentation

## 2019-12-17 DIAGNOSIS — Z89022 Acquired absence of left finger(s): Secondary | ICD-10-CM | POA: Diagnosis not present

## 2019-12-17 DIAGNOSIS — F419 Anxiety disorder, unspecified: Secondary | ICD-10-CM | POA: Diagnosis not present

## 2019-12-17 DIAGNOSIS — Z880 Allergy status to penicillin: Secondary | ICD-10-CM | POA: Diagnosis not present

## 2019-12-17 DIAGNOSIS — E785 Hyperlipidemia, unspecified: Secondary | ICD-10-CM | POA: Insufficient documentation

## 2019-12-17 DIAGNOSIS — F329 Major depressive disorder, single episode, unspecified: Secondary | ICD-10-CM | POA: Diagnosis not present

## 2019-12-17 DIAGNOSIS — Z7982 Long term (current) use of aspirin: Secondary | ICD-10-CM | POA: Insufficient documentation

## 2019-12-17 DIAGNOSIS — I10 Essential (primary) hypertension: Secondary | ICD-10-CM | POA: Diagnosis not present

## 2019-12-17 DIAGNOSIS — M160 Bilateral primary osteoarthritis of hip: Secondary | ICD-10-CM | POA: Insufficient documentation

## 2019-12-17 HISTORY — PX: CYST EXCISION: SHX5701

## 2019-12-17 SURGERY — CYST REMOVAL
Anesthesia: Regional | Site: Hand | Laterality: Left

## 2019-12-17 MED ORDER — VANCOMYCIN HCL IN DEXTROSE 1-5 GM/200ML-% IV SOLN
1000.0000 mg | INTRAVENOUS | Status: AC
  Administered 2019-12-17: 1000 mg via INTRAVENOUS

## 2019-12-17 MED ORDER — VANCOMYCIN HCL IN DEXTROSE 1-5 GM/200ML-% IV SOLN
INTRAVENOUS | Status: AC
  Filled 2019-12-17: qty 200

## 2019-12-17 MED ORDER — FENTANYL CITRATE (PF) 100 MCG/2ML IJ SOLN
INTRAMUSCULAR | Status: DC | PRN
  Administered 2019-12-17 (×2): 50 ug via INTRAVENOUS

## 2019-12-17 MED ORDER — LACTATED RINGERS IV SOLN: INTRAVENOUS | Status: DC

## 2019-12-17 MED ORDER — CHLORHEXIDINE GLUCONATE 4 % EX LIQD: 60.0000 mL | Freq: Once | CUTANEOUS | Status: DC

## 2019-12-17 MED ORDER — BUPIVACAINE HCL (PF) 0.25 % IJ SOLN
INTRAMUSCULAR | Status: DC | PRN
  Administered 2019-12-17: 10 mL

## 2019-12-17 MED ORDER — FENTANYL CITRATE (PF) 100 MCG/2ML IJ SOLN: 50.0000 ug | INTRAMUSCULAR | Status: DC | PRN

## 2019-12-17 MED ORDER — FENTANYL CITRATE (PF) 100 MCG/2ML IJ SOLN: 25.0000 ug | INTRAMUSCULAR | Status: DC | PRN

## 2019-12-17 MED ORDER — FENTANYL CITRATE (PF) 100 MCG/2ML IJ SOLN
INTRAMUSCULAR | Status: AC
  Filled 2019-12-17: qty 2

## 2019-12-17 MED ORDER — PROPOFOL 500 MG/50ML IV EMUL
INTRAVENOUS | Status: DC | PRN
  Administered 2019-12-17: 25 ug/kg/min via INTRAVENOUS

## 2019-12-17 MED ORDER — MIDAZOLAM HCL 2 MG/2ML IJ SOLN: 1.0000 mg | INTRAMUSCULAR | Status: DC | PRN

## 2019-12-17 MED ORDER — TRAMADOL HCL 50 MG PO TABS: 50.0000 mg | ORAL_TABLET | Freq: Four times a day (QID) | ORAL | 0 refills | Status: DC | PRN

## 2019-12-17 MED ORDER — LIDOCAINE HCL (PF) 2 % IJ SOLN
INTRAMUSCULAR | Status: DC | PRN
  Administered 2019-12-17: 35 mg via INTRADERMAL

## 2019-12-17 MED ORDER — ONDANSETRON HCL 4 MG/2ML IJ SOLN
INTRAMUSCULAR | Status: AC
  Filled 2019-12-17: qty 2

## 2019-12-17 MED ORDER — ONDANSETRON HCL 4 MG/2ML IJ SOLN: 4.0000 mg | Freq: Once | INTRAMUSCULAR | Status: DC | PRN

## 2019-12-17 MED ORDER — ONDANSETRON HCL 4 MG/2ML IJ SOLN
INTRAMUSCULAR | Status: DC | PRN
  Administered 2019-12-17: 4 mg via INTRAVENOUS

## 2019-12-17 SURGICAL SUPPLY — 50 items
APL PRP STRL LF DISP 70% ISPRP (MISCELLANEOUS) ×1
BLADE MINI RND TIP GREEN BEAV (BLADE) ×1 IMPLANT
BLADE SURG 15 STRL LF DISP TIS (BLADE) ×1 IMPLANT
BLADE SURG 15 STRL SS (BLADE) ×2
BNDG CMPR 9X4 STRL LF SNTH (GAUZE/BANDAGES/DRESSINGS)
BNDG COHESIVE 1X5 TAN STRL LF (GAUZE/BANDAGES/DRESSINGS) IMPLANT
BNDG COHESIVE 2X5 TAN STRL LF (GAUZE/BANDAGES/DRESSINGS) IMPLANT
BNDG COHESIVE 3X5 TAN STRL LF (GAUZE/BANDAGES/DRESSINGS) IMPLANT
BNDG ESMARK 4X9 LF (GAUZE/BANDAGES/DRESSINGS) IMPLANT
BNDG GAUZE ELAST 4 BULKY (GAUZE/BANDAGES/DRESSINGS) IMPLANT
CHLORAPREP W/TINT 26 (MISCELLANEOUS) ×2 IMPLANT
CORD BIPOLAR FORCEPS 12FT (ELECTRODE) ×2 IMPLANT
COVER BACK TABLE 60X90IN (DRAPES) ×2 IMPLANT
COVER MAYO STAND STRL (DRAPES) ×2 IMPLANT
COVER WAND RF STERILE (DRAPES) IMPLANT
CUFF TOURN SGL QUICK 18X4 (TOURNIQUET CUFF) IMPLANT
DECANTER SPIKE VIAL GLASS SM (MISCELLANEOUS) IMPLANT
DRAIN PENROSE 1/2X12 LTX STRL (WOUND CARE) IMPLANT
DRAPE EXTREMITY T 121X128X90 (DISPOSABLE) ×2 IMPLANT
DRAPE SURG 17X23 STRL (DRAPES) ×2 IMPLANT
GAUZE SPONGE 4X4 12PLY STRL (GAUZE/BANDAGES/DRESSINGS) ×2 IMPLANT
GAUZE XEROFORM 1X8 LF (GAUZE/BANDAGES/DRESSINGS) ×2 IMPLANT
GLOVE BIO SURGEON STRL SZ 6.5 (GLOVE) ×2 IMPLANT
GLOVE BIOGEL PI IND STRL 7.0 (GLOVE) IMPLANT
GLOVE BIOGEL PI IND STRL 8.5 (GLOVE) ×1 IMPLANT
GLOVE BIOGEL PI INDICATOR 7.0 (GLOVE) ×3
GLOVE BIOGEL PI INDICATOR 8.5 (GLOVE) ×1
GLOVE SURG ORTHO 8.0 STRL STRW (GLOVE) ×2 IMPLANT
GOWN STRL REUS W/ TWL LRG LVL3 (GOWN DISPOSABLE) ×1 IMPLANT
GOWN STRL REUS W/TWL LRG LVL3 (GOWN DISPOSABLE) ×2
GOWN STRL REUS W/TWL XL LVL3 (GOWN DISPOSABLE) ×2 IMPLANT
NDL PRECISIONGLIDE 27X1.5 (NEEDLE) IMPLANT
NEEDLE PRECISIONGLIDE 27X1.5 (NEEDLE) IMPLANT
NS IRRIG 1000ML POUR BTL (IV SOLUTION) ×2 IMPLANT
PACK BASIN DAY SURGERY FS (CUSTOM PROCEDURE TRAY) ×2 IMPLANT
PAD CAST 3X4 CTTN HI CHSV (CAST SUPPLIES) IMPLANT
PADDING CAST ABS 3INX4YD NS (CAST SUPPLIES)
PADDING CAST ABS 4INX4YD NS (CAST SUPPLIES) ×1
PADDING CAST ABS COTTON 3X4 (CAST SUPPLIES) IMPLANT
PADDING CAST ABS COTTON 4X4 ST (CAST SUPPLIES) ×1 IMPLANT
PADDING CAST COTTON 3X4 STRL (CAST SUPPLIES)
SPLINT PLASTER CAST XFAST 3X15 (CAST SUPPLIES) IMPLANT
SPLINT PLASTER XTRA FASTSET 3X (CAST SUPPLIES)
STOCKINETTE 4X48 STRL (DRAPES) ×2 IMPLANT
SUT ETHILON 4 0 PS 2 18 (SUTURE) ×2 IMPLANT
SUT VIC AB 4-0 P2 18 (SUTURE) IMPLANT
SYR BULB 3OZ (MISCELLANEOUS) ×2 IMPLANT
SYR CONTROL 10ML LL (SYRINGE) IMPLANT
TOWEL GREEN STERILE FF (TOWEL DISPOSABLE) ×3 IMPLANT
UNDERPAD 30X36 HEAVY ABSORB (UNDERPADS AND DIAPERS) ×2 IMPLANT

## 2019-12-17 NOTE — Anesthesia Postprocedure Evaluation (Signed)
Anesthesia Post Note  Patient: Chris Welch  Procedure(s) Performed: CYST EXCISION; EXCISION NAIL HORN REMNANT LEFT RING FINGER (Left Hand)     Patient location during evaluation: PACU Anesthesia Type: Bier Block Level of consciousness: awake and alert Pain management: pain level controlled Vital Signs Assessment: post-procedure vital signs reviewed and stable Respiratory status: spontaneous breathing, nonlabored ventilation, respiratory function stable and patient connected to nasal cannula oxygen Cardiovascular status: stable and blood pressure returned to baseline Postop Assessment: no apparent nausea or vomiting Anesthetic complications: no    Last Vitals:  Vitals:   12/17/19 1255 12/17/19 1309  BP: 134/85 (!) 152/99  Pulse: (!) 55 74  Resp: (!) 9 18  Temp:  36.7 C  SpO2: 99% 98%    Last Pain:  Vitals:   12/17/19 1309  TempSrc:   PainSc: 0-No pain                 Nolon Nations

## 2019-12-17 NOTE — Anesthesia Preprocedure Evaluation (Signed)
Anesthesia Evaluation  Patient identified by MRN, date of birth, ID band Patient awake    Reviewed: Allergy & Precautions, NPO status , Patient's Chart, lab work & pertinent test results  History of Anesthesia Complications Negative for: history of anesthetic complications  Airway Mallampati: II  TM Distance: >3 FB Neck ROM: Full    Dental  (+) Teeth Intact, Dental Advisory Given   Pulmonary neg pulmonary ROS,    Pulmonary exam normal        Cardiovascular hypertension, Pt. on medications Normal cardiovascular exam     Neuro/Psych PSYCHIATRIC DISORDERS Anxiety Depression  Neuromuscular disease    GI/Hepatic negative GI ROS, Neg liver ROS, GERD  Medicated,  Endo/Other  negative endocrine ROS  Renal/GU negative Renal ROS     Musculoskeletal  (+) Arthritis ,   Abdominal   Peds  Hematology negative hematology ROS (+)   Anesthesia Other Findings Right vocal cord paralysis  Reproductive/Obstetrics                             Anesthesia Physical  Anesthesia Plan  ASA: II  Anesthesia Plan: Bier Block and Bier Block-LIDOCAINE ONLY   Post-op Pain Management:    Induction: Intravenous  PONV Risk Score and Plan: 1 and Treatment may vary due to age or medical condition and Propofol infusion  Airway Management Planned: Natural Airway, Nasal Cannula and Simple Face Mask  Additional Equipment: None  Intra-op Plan:   Post-operative Plan:   Informed Consent: I have reviewed the patients History and Physical, chart, labs and discussed the procedure including the risks, benefits and alternatives for the proposed anesthesia with the patient or authorized representative who has indicated his/her understanding and acceptance.     Dental advisory given  Plan Discussed with: CRNA  Anesthesia Plan Comments:         Anesthesia Quick Evaluation

## 2019-12-17 NOTE — H&P (Signed)
Chris Welch is an 74 y.o. male.   Chief Complaint::   left ring finger mass      HPI:  Chris Welch is post  amputation left ring finger. He had had pain at the tip and nail horns removed in the past. he was sent for ultrasound at that time for examination of the mass on the radial aspect of the fingertip. This particularly painful for him if he hits it. The ultrasound reveals a mass is cystic in nature on the radial aspect tip of his ring finger left hand.  Past Medical History:  Diagnosis Date  . Anxiety   . Arthritis    "hips, knees" (01/27/2016)  . Complication of anesthesia    bad dreams  . Depression   . Diverticulitis   . Diverticulosis   . Ear infection ear infection lasting past 2 months, still ongoing, pt on antibiotics & drops. pt states "ear infection has eaten holes through bilateral ear drums,  I am hard of hearing"  . Enlarged prostate   . Esophageal stricture   . Gastropathy    reactive  . GERD (gastroesophageal reflux disease)   . Hard of hearing   . Hyperlipidemia   . Hypertension   . 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 Pneumonia    x 3, last 2009 (01/27/2016)  . 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 Vocal cord paralysis    Right    Past Surgical History:  Procedure Laterality Date  . APPENDECTOMY    . BACK SURGERY    . CAROTID BODY TUMOR EXCISION    . CERVICAL PARAGANGLIOMA EXCISION Right 12/2006  . CHOLECYSTECTOMY OPEN    . DEEP NECK LYMPH NODE BIOPSY / EXCISION    . DIGIT NAIL REMOVAL Left 07/30/2019   Procedure: REMOVAL OF NAIL HORNS LEFT RING FINGER;  Surgeon: Daryll Brod, MD;  Location: [facility];  Service: Orthopedics;  Laterality: Left;  IV REGIONAL FOREARM BLOCK  . ESOPHAGOGASTRODUODENOSCOPY (EGD) WITH ESOPHAGEAL DILATION    . INGUINAL HERNIA REPAIR Right 1954  . JOINT REPLACEMENT    . KNEE ARTHROSCOPY Right   . LUMBAR LAMINECTOMY/DECOMPRESSION MICRODISCECTOMY Left 03/09/2013   Procedure: LUMBAR LAMINECTOMY/DECOMPRESSION MICRODISCECTOMY 1 LEVEL;  Surgeon: Eustace Moore, MD;  Location: [facility];  Service: Neurosurgery;  Laterality: Left;  Left lumbar three-four extra-foraminal microdiscectomy  . PATELLA ARTHROPLASTY Right    has had 5 surgeries right knee  . TOTAL HIP ARTHROPLASTY Left 01/27/2016  . TOTAL HIP ARTHROPLASTY Left 01/27/2016   Procedure: TOTAL HIP ARTHROPLASTY ANTERIOR APPROACH;  Surgeon: Frederik Pear, MD;  Location: [facility];  Service: Orthopedics;  Laterality: Left;  [name] Kitchen VOCAL CORD IMPLANT      Family History  Problem Relation Age of Onset  . Arthritis Brother        back  . Lung cancer Brother 45        (dx to death: 30 days)  . Cancer Brother        rare cancer-unknown type  . Heart disease Mother   . Emphysema Father    Social History:  reports that he has never smoked. He has never used smokeless tobacco. He reports current alcohol use of about 3.0 standard drinks of alcohol per week. He reports that he does not use drugs.  Allergies:  Allergies  Allergen Reactions  . Hydrocodone Nausea And Vomiting    "can not take on an empty stomach"  . Penicillins Other (  See Comments) and Rash    Ulcers on eyes    Medications Prior to Admission  Medication Sig Dispense Refill  . albuterol (PROVENTIL HFA;VENTOLIN HFA) 108 (90 Base) MCG/ACT inhaler Inhale 2 puffs into the lungs every 6 (six) hours as needed for wheezing or shortness of breath.    [name] Kitchen aspirin EC 81 MG tablet Take 81 mg by mouth daily.    [name] Kitchen buPROPion (WELLBUTRIN XL) 150 MG 24 hr tablet Take 3 tablets (450 mg total) by mouth daily. 270 tablet 3  . finasteride (PROSCAR) 5 MG tablet Take 1 tablet (5 mg total) by mouth daily. 30 tablet 0  . fluticasone (FLONASE) 50 MCG/ACT nasal spray Place 2 sprays into both nostrils daily. 48 g 4  . glycopyrrolate (ROBINUL) 1 MG tablet Take 1 tablet (1 mg total) by mouth 3 (three) times daily. 270 tablet 3  . losartan (COZAAR) 50 MG tablet TAKE 1 TABLET BY  MOUTH ONCE DAILY 30 tablet 5  . omeprazole (PRILOSEC) 40 MG capsule TAKE 1 CAPSULE BY MOUTH 2 TIMES DAILY. 60 capsule 11  . pravastatin (PRAVACHOL) 20 MG tablet Take 1 tablet (20 mg total) by mouth daily. 90 tablet 3  . traZODone (DESYREL) 50 MG tablet TAKE 1 TABLET BY MOUTH AT BEDTIME 30 tablet 1  . VIAGRA 100 MG tablet TAKE 1/2-1 TABLET BY MOUTH DAILY AS NEEDED FOR ERECTILE DYSFUNCTION 5 tablet 11    No results found for this or any previous visit (from the past 48 hour(s)).  No results found.   Pertinent items are noted in HPI.  Blood pressure 136/83, pulse 65, temperature 97.9 F (36.6 C), temperature source Oral, resp. rate 16, height 6\' 3"  (1.905 m), weight 102.2 kg, SpO2 100 %.  General appearance: alert, cooperative and appears stated age Head: Normocephalic, without obvious abnormality Neck: no JVD Resp: clear to auscultation bilaterally Cardio: regular rate and rhythm, S1, S2 normal, no murmur, click, rub or gallop GI: soft, non-tender; bowel sounds normal; no masses,  no organomegaly Extremities: Mass left ring finger tip Pulses: 2+ and symmetric Skin: Skin color, texture, turgor normal. No rashes or lesions Neurologic: Grossly normal Incision/Wound: na  Assessment/Plan Assessment:  1. Amputation of left ring finger    Plan: We will schedule him for removal of the cyst debridement of the nail horn. This will be scheduled in outpatient under regional anesthesia left ring finger. Aware of the potential for recurrence of any epidermal inclusion cyst.      Daryll Brod 12/17/2019, 11:02 AM

## 2019-12-17 NOTE — Brief Op Note (Signed)
12/17/2019  12:04 PM  PATIENT:  Chris Welch  74 y.o. male  PRE-OPERATIVE DIAGNOSIS:  CYST LEFT RING FINGER  POST-OPERATIVE DIAGNOSIS:  CYST LEFT RING FINGER  PROCEDURE:  Procedure(s) with comments: CYST EXCISION; EXCISION NAIL HORN REMNANT LEFT RING FINGER (Left) - IV REGIONAL  SURGEON:  Surgeon(s) and Role:    * Daryll Brod, MD - Primary  PHYSICIAN ASSISTANT:   ASSISTANTS: none   ANESTHESIA:   local, regional and IV sedation  EBL:  94ml  BLOOD ADMINISTERED:none  DRAINS: none   LOCAL MEDICATIONS USED:  BUPIVICAINE   SPECIMEN:  Excision  DISPOSITION OF SPECIMEN:  PATHOLOGY  COUNTS:  YES  TOURNIQUET:   Total Tourniquet Time Documented: Forearm (Left) - 30 minutes Total: Forearm (Left) - 30 minutes   DICTATION: .Dragon Dictation  PLAN OF CARE: Discharge to home after PACU  PATIENT DISPOSITION:  PACU - hemodynamically stable.

## 2019-12-17 NOTE — Transfer of Care (Signed)
Immediate Anesthesia Transfer of Care Note  Patient: Chris Welch  Procedure(s) Performed: CYST EXCISION; EXCISION NAIL HORN REMNANT LEFT RING FINGER (Left Hand)  Patient Location: PACU  Anesthesia Type:MAC and Bier block  Level of Consciousness: awake and alert   Airway & Oxygen Therapy: Patient Spontanous Breathing and Patient connected to face mask oxygen  Post-op Assessment: Report given to RN and Post -op Vital signs reviewed and stable  Post vital signs: Reviewed and stable  Last Vitals:  Vitals Value Taken Time  BP 111/85 12/17/19 1204  Temp    Pulse 58 12/17/19 1205  Resp 7 12/17/19 1205  SpO2 97 % 12/17/19 1205  Vitals shown include unvalidated device data.  Last Pain:  Vitals:   12/17/19 0904  TempSrc: Oral  PainSc: 0-No pain      Patients Stated Pain Goal: 3 (73/71/06 2694)  Complications: No apparent anesthesia complications

## 2019-12-17 NOTE — Op Note (Signed)
NAME: Chris Welch MEDICAL RECORD NO: [id number] DATE OF BIRTH: 06-19-1946 [facility] LOCATION: [facility] PHYSICIAN: Wynonia Sours, MD   OPERATIVE REPORT   DATE OF PROCEDURE: 12/17/19    PREOPERATIVE DIAGNOSIS:   Nail horn and epidermal inclusion cyst left ring finger   POSTOPERATIVE DIAGNOSIS:   Same   PROCEDURE:  Excision nail horn and epidermal inclusion cyst left ring finger   SURGEON: Daryll Brod, M.D.   ASSISTANT: none   ANESTHESIA:  Bier block with sedation and Local   INTRAVENOUS FLUIDS:  Per anesthesia flow sheet.   ESTIMATED BLOOD LOSS:  Minimal.   COMPLICATIONS:  None.   SPECIMENS:   Epidermal inclusion cyst   TOURNIQUET TIME:    Total Tourniquet Time Documented: Forearm (Left) - 30 minutes Total: Forearm (Left) - 30 minutes    DISPOSITION:  Stable to PACU.   INDICATIONS: Patient is a 74 year old male who sustained an amputation of his left ring finger.  He has had a painful area on the radial aspect which has enlarged and is indicative of an epidermal inclusion cyst ultrasound reveals that there is a small radial side.mass present.  He has a small nail horn on the ulnar side.  He is desires having this removed he is well aware of risks and complications.  In preoperative area the patient is seen extremity marked by both patient and surgeon antibiotic given OPERATIVE COURSE: Patient is brought to the operating room where form based IV regional anesthetic was carried out without difficulty.  He was prepped using ChloraPrep in the supine position with left arm free.  3-minute dry time was allowed timeout taken to confirm patient procedure.  A metacarpal block was given quarter percent bupivacaine without epinephrine approximately 10 cc was used.  Nail horn was approached first.  An elliptical incision was made about this carried down on either side of the small horn.  This was removed in toto.  The base was cauterized with bipolar.   The wound was irrigated and closed interrupted 4-0 nylon sutures.  A separate incision was then made on the radial side of the digit.  The area of very small pointing was incised a white toothpaste fluid exuded.  The incision was extended radially and ulnarly.  The cyst was identified with blunt sharp dissection it was isolated removed and sent to pathology.  There appeared to be at least 2 epidermal inclusion cyst present.  The wound was copious irrigated with saline closed interrupted 4-0 nylon sutures.  A sterile compressive dressing and splint to the finger was applied.  Inflation the tourniquet remaining fingers pink.  He was taken to the recovery room for observation in satisfactory condition.  He will be discharged home to return [place] in 1week Tylenol ibuprofen for pain with Ultram for breakthrough.   Daryll Brod, MD Electronically signed, 12/17/19

## 2019-12-17 NOTE — Anesthesia Procedure Notes (Signed)
Anesthesia Regional Block: Bier block (IV Regional)   Pre-Anesthetic Checklist: ,, timeout performed, Correct Patient, Correct Site, Correct Laterality, Correct Procedure,, site marked, surgical consent,, at surgeon's request  Laterality: Left     Needles:  Injection technique: Single-shot  Needle Type: Other      Needle Gauge: 22     Additional Needles:   Procedures:,,,,, intact distal pulses, Esmarch exsanguination, single tourniquet utilized,  Narrative:  Start time: 12/17/2019 11:28 AM End time: 12/17/2019 11:29 AM Injection made incrementally with aspirations every 35 mL.  Performed by: Personally

## 2019-12-17 NOTE — Discharge Instructions (Addendum)
Regional Anesthesia Blocks  1. Numbness or the inability to move the "blocked" extremity may last from 3-48 hours after placement. The length of time depends on the medication injected and your individual response to the medication. If the numbness is not going away after 48 hours, call your surgeon.  2. The extremity that is blocked will need to be protected until the numbness is gone and the  Strength has returned. Because you cannot feel it, you will need to take extra care to avoid injury. Because it may be weak, you may have difficulty moving it or using it. You may not know what position it is in without looking at it while the block is in effect.  3. For blocks in the legs and feet, returning to weight bearing and walking needs to be done carefully. You will need to wait until the numbness is entirely gone and the strength has returned. You should be able to move your leg and foot normally before you try and bear weight or walk. You will need someone to be with you when you first try to ensure you do not fall and possibly risk injury.  4. Bruising and tenderness at the needle site are common side effects and will resolve in a few days.  5. Persistent numbness or new problems with movement should be communicated to the surgeon or the [facility] [phone] [place] [phone]).   [facility] Instructions Hand Surgery  Wound Care: Keep your hand elevated above the level of your heart.  Do not allow it to dangle by your side.  Keep the dressing dry and do not remove it unless your doctor advises you to do so.  He will usually change it at the time of your post-op visit.  Moving your fingers is advised to stimulate circulation but will depend on the site of your surgery.  If you have a splint applied, your doctor will advise you regarding movement.  Activity: Do not drive or operate machinery today.  Rest today and then you may return to your normal activity  and work as indicated by your physician.  Diet:  Drink liquids today or eat a light diet.  You may resume a regular diet tomorrow.    General expectations: Pain for two to three days. Fingers may become slightly swollen.  Call your doctor if any of the following occur: Severe pain not relieved by pain medication. Elevated temperature. Dressing soaked with blood. Inability to move fingers. White or bluish color to fingers.

## 2019-12-18 ENCOUNTER — Encounter: Payer: Self-pay | Admitting: *Deleted

## 2019-12-18 LAB — SURGICAL PATHOLOGY

## 2019-12-18 NOTE — Addendum Note (Signed)
Addendum  created 12/18/19 1229 by Tawni Millers, CRNA   Charge Capture section accepted

## 2019-12-25 MED FILL — traMADol HCL 50 MG TABS: 50 | 5 days supply | Qty: 20 | Fill #0

## 2020-02-13 NOTE — Progress Notes (Signed)
Pt's wife Marlowe Kays answered stating that pt was in shower and forgot about appt. AWV rescheduled for 02/21/20

## 2020-02-14 ENCOUNTER — Other Ambulatory Visit: Payer: Self-pay

## 2020-02-14 ENCOUNTER — Ambulatory Visit: Payer: Medicare Other | Admitting: *Deleted

## 2020-02-19 ENCOUNTER — Encounter: Payer: Self-pay | Admitting: Family

## 2020-02-20 ENCOUNTER — Encounter: Payer: Self-pay | Admitting: Gastroenterology

## 2020-02-21 ENCOUNTER — Ambulatory Visit: Payer: Medicare Other | Admitting: *Deleted

## 2020-02-27 ENCOUNTER — Encounter: Payer: Self-pay | Admitting: Nurse Practitioner

## 2020-03-04 NOTE — Progress Notes (Addendum)
I connected with Lejend today by telephone and verified that I am speaking with the correct person using two identifiers. Location patient: home Location provider: work Persons participating in the virtual visit: patient, Marine scientist.    I discussed the limitations, risks, security and privacy concerns of performing an evaluation and management service by telephone and the availability of in person appointments. I also discussed with the patient that there may be a patient responsible charge related to this service. The patient expressed understanding and verbally consented to this telephonic visit.    Interactive audio and video telecommunications were attempted between this provider and patient, however failed, due to patient having technical difficulties OR patient did not have access to video capability.  We continued and completed visit with audio only.  Some vital signs may be absent or patient reported.   Subjective:   Chris Welch is a 74 y.o. male who presents for Medicare Annual/Subsequent preventive examination.  Plays golf 3-4x/ week.   Review of Systems:  Cardiac Risk Factors include: advanced age (>83men, >57 women);male gender;hypertension     Objective:    Vitals: Unable to assess. This visit is enabled though telemedicine due to Covid 19.   Advanced Directives 03/06/2020 12/17/2019 12/10/2019 07/30/2019 07/23/2019 01/27/2016 01/15/2016  Does Patient Have a Medical Advance Directive? No No No Yes Yes No No  Type of Advance Directive - - Public librarian;Living will Living will;Healthcare Power of Attorney - -  Does patient want to make changes to medical advance directive? - - - No - Patient declined No - Patient declined - -  Copy of [place] in Chart? - - - No - copy requested - - -  Would patient like information on creating a medical advance directive? No - Patient declined No - Patient declined No - Patient declined - - No -  patient declined information No - patient declined information    Tobacco Social History   Tobacco Use  Smoking Status Never Smoker  Smokeless Tobacco Never Used     Counseling given: Not Answered   Clinical Intake: Pain : No/denies pain     Past Medical History:  Diagnosis Date  . Anxiety   . Arthritis    "hips, knees" (01/27/2016)  . Complication of anesthesia    bad dreams  . Depression   . Diverticulitis   . Diverticulosis   . Ear infection ear infection lasting past 2 months, still ongoing, pt on antibiotics & drops. pt states "ear infection has eaten holes through bilateral ear drums,  I am hard of hearing"  . Enlarged prostate   . Esophageal stricture   . Gastropathy    reactive  . GERD (gastroesophageal reflux disease)   . Hard of hearing   . Hyperlipidemia   . Hypertension   . 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 Pneumonia    x 3, last 2009 (01/27/2016)  . 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 Vocal cord paralysis    Right   Past Surgical History:  Procedure Laterality Date  . APPENDECTOMY    . BACK SURGERY    . CAROTID BODY TUMOR EXCISION    . CERVICAL PARAGANGLIOMA EXCISION Right 12/2006  . CHOLECYSTECTOMY OPEN    . CYST EXCISION Left 12/17/2019   Procedure: CYST EXCISION; EXCISION NAIL HORN REMNANT LEFT RING FINGER;  Surgeon: Daryll Brod, MD;  Location: [facility];  Service: Orthopedics;  Laterality: Left;  IV REGIONAL  . DEEP NECK LYMPH NODE BIOPSY / EXCISION    . DIGIT NAIL REMOVAL Left 07/30/2019   Procedure: REMOVAL OF NAIL HORNS LEFT RING FINGER;  Surgeon: Daryll Brod, MD;  Location: [facility];  Service: Orthopedics;  Laterality: Left;  IV REGIONAL FOREARM BLOCK  . ESOPHAGOGASTRODUODENOSCOPY (EGD) WITH ESOPHAGEAL DILATION    . INGUINAL HERNIA REPAIR Right 1954  . JOINT REPLACEMENT    . KNEE ARTHROSCOPY Right   . LUMBAR LAMINECTOMY/DECOMPRESSION MICRODISCECTOMY Left 03/09/2013    Procedure: LUMBAR LAMINECTOMY/DECOMPRESSION MICRODISCECTOMY 1 LEVEL;  Surgeon: Eustace Moore, MD;  Location: [facility];  Service: Neurosurgery;  Laterality: Left;  Left lumbar three-four extra-foraminal microdiscectomy  . PATELLA ARTHROPLASTY Right    has had 5 surgeries right knee  . TOTAL HIP ARTHROPLASTY Left 01/27/2016  . TOTAL HIP ARTHROPLASTY Left 01/27/2016   Procedure: TOTAL HIP ARTHROPLASTY ANTERIOR APPROACH;  Surgeon: Frederik Pear, MD;  Location: [facility];  Service: Orthopedics;  Laterality: Left;  [name] Kitchen VOCAL CORD IMPLANT     Family History  Problem Relation Age of Onset  . Arthritis Brother        back  . Lung cancer Brother 6        (dx to death: 30 days)  . Cancer Brother        rare cancer-unknown type  . Heart disease Mother   . Emphysema Father    Social History   Socioeconomic History  . Marital status: Married    Spouse name: Marlowe Kays Tolle  . Number of children: 4  . Years of education: 68  . Highest education level: Not on file  Occupational History  . Occupation: Machinist-retired 2015    Employer: [employer]    Comment: [employer]  . 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 Smoking status: Never Smoker  . Smokeless tobacco: Never Used  Vaping Use  . Vaping Use: Never used  Substance and Sexual Activity  . Alcohol use: Yes    Alcohol/week: 3.0 standard drinks    Types: 3 Cans of beer per week    Comment: social  . Drug use: No  . Sexual activity: Yes    Partners: Female  Other Topics Concern  . Not on file  Social History Narrative   Lives with his wife.  Three children are grown.  Younger daughter will graduate from [employer] in 01/2013.  Both sons and oldest daughter are in [place]. Exercises regularly.   His wife retired 12/2015 and is enjoying spending time in the yard, and with their family.   Social Determinants of Health   Financial Resource Strain: Low Risk   . Difficulty of Paying Living Expenses: Not hard at all  Food  Insecurity: No Food Insecurity  . Worried About Charity fundraiser in the Last Year: Never true  . Ran Out of Food in the Last Year: Never true  Transportation Needs: No Transportation Needs  . Lack of Transportation (Medical): No  . Lack of Transportation (Non-Medical): No  Physical Activity:   . Days of Exercise per Week:   . Minutes of Exercise per Session:   Stress:   . Feeling of Stress :   Social Connections:   . Frequency of Communication with Friends and Family:   . Frequency of Social Gatherings with Friends and Family:   . Attends Religious Services:   . Active Member of Clubs or Organizations:   . Attends Club or  Organization Meetings:   [name] Kitchen Marital Status:     Outpatient Encounter Medications as of 03/06/2020  Medication Sig  . albuterol (PROVENTIL HFA;VENTOLIN HFA) 108 (90 Base) MCG/ACT inhaler Inhale 2 puffs into the lungs every 6 (six) hours as needed for wheezing or shortness of breath.  [name] Kitchen aspirin EC 81 MG tablet Take 81 mg by mouth daily.  [name] Kitchen buPROPion (WELLBUTRIN XL) 150 MG 24 hr tablet Take 3 tablets (450 mg total) by mouth daily.  . finasteride (PROSCAR) 5 MG tablet Take 1 tablet (5 mg total) by mouth daily.  . fluticasone (FLONASE) 50 MCG/ACT nasal spray Place 2 sprays into both nostrils daily.  [name] Kitchen glycopyrrolate (ROBINUL) 1 MG tablet Take 1 tablet (1 mg total) by mouth 3 (three) times daily.  [name] Kitchen losartan (COZAAR) 50 MG tablet TAKE 1 TABLET BY MOUTH ONCE DAILY  . omeprazole (PRILOSEC) 40 MG capsule TAKE 1 CAPSULE BY MOUTH 2 TIMES DAILY.  . pravastatin (PRAVACHOL) 20 MG tablet Take 1 tablet (20 mg total) by mouth daily.  . traMADol (ULTRAM) 50 MG tablet Take 1 tablet (50 mg total) by mouth every 6 (six) hours as needed.  . traZODone (DESYREL) 50 MG tablet TAKE 1 TABLET BY MOUTH AT BEDTIME  . VIAGRA 100 MG tablet TAKE 1/2-1 TABLET BY MOUTH DAILY AS NEEDED FOR ERECTILE DYSFUNCTION  . tamsulosin (FLOMAX) 0.4 MG CAPS capsule Take by mouth.   No facility-administered  encounter medications on file as of 03/06/2020.    Activities of Daily Living In your present state of health, do you have any difficulty performing the following activities: 03/06/2020 12/17/2019  Hearing? Y N  Comment wears hearing aids. -  Vision? N N  Difficulty concentrating or making decisions? N N  Walking or climbing stairs? N N  Dressing or bathing? N N  Doing errands, shopping? N -  Preparing Food and eating ? N -  Using the Toilet? N -  In the past six months, have you accidently leaked urine? N -  Do you have problems with loss of bowel control? N -  Managing your Medications? N -  Managing your Finances? N -  Housekeeping or managing your Housekeeping? N -  Some recent data might be hidden    Patient Care Team: Copland, Gay Filler, MD as PCP - General (Family Medicine) Jerline Pain, MD as PCP - Cardiology (Cardiology) Jerline Pain, MD as Consulting Physician (Cardiology)   Assessment:   This is a routine wellness examination for Kaycen.  Exercise Activities and Dietary recommendations Current Exercise Habits: The patient does not participate in regular exercise at present, Exercise limited by: None identified Diet (meal preparation, eat out, water intake, caffeinated beverages, dairy products, fruits and vegetables): well balanced      Goals Addressed            This Visit's Progress   . Maintain healthy active lifestyle.         Fall Risk: Fall Risk  03/06/2020 05/24/2018 01/05/2016 07/08/2014 11/26/2013  Falls in the past year? 0 No No No Yes  Number falls in past yr: 0 - - - -  Injury with Fall? 0 - - - -  Risk for fall due to : - - - - (No Data)  Risk for fall due to: Comment - - - - pt fell during the time that he had back surgery  Follow up Education provided;Falls prevention discussed - - - -    FALL RISK PREVENTION PERTAINING TO THE HOME:  Any stairs in or around the home? Yes  If so, are there any without handrails? Yes   Home free of loose  throw rugs in walkways, pet beds, electrical cords, etc? yes Adequate lighting in your home to reduce risk of falls? Yes   ASSISTIVE DEVICES UTILIZED TO PREVENT FALLS:  Life alert? No  Use of a cane, walker or w/c? No  Grab bars in the bathroom? No  Shower chair or bench in shower? No  Elevated toilet seat or a handicapped toilet? No   TIMED UP AND GO:  Was the test performed? No .   Depression Screen PHQ 2/9 Scores 03/06/2020 01/05/2016 06/04/2015 11/26/2013  PHQ - 2 Score 1 2 0 4  PHQ- 9 Score - 16 - 4    Cognitive Function Ad8 score reviewed for issues:  Issues making decisions:no  Less interest in hobbies / activities:no  Repeats questions, stories (family complaining):no  Trouble using ordinary gadgets (microwave, computer, phone):no  Forgets the month or year: no  Mismanaging finances: no  Remembering appts:no  Daily problems with thinking and/or memory:no Ad8 score is=0          Immunization History  Administered Date(s) Administered  . Fluad Quad(high Dose 65+) 05/29/2019  . Influenza Split 07/08/2011, 07/09/2012  . Influenza, High Dose Seasonal PF 06/23/2017, 05/24/2018  . Influenza,inj,Quad PF,6+ Mos 05/28/2013, 07/08/2014, 07/23/2015  . PFIZER SARS-COV-2 Vaccination 11/03/2019, 11/25/2019  . Pneumococcal Conjugate-13 10/21/2014  . Pneumococcal Polysaccharide-23 04/17/2012  . Tdap 04/17/2012  . Zoster 01/25/2011    Qualifies for Shingles Vaccine? Yes  Zostavax completed 01/25/11. Due for Shingrix. Education has been provided regarding the importance of this vaccine. Pt has been advised to call insurance company to determine out of pocket expense. Advised may also receive vaccine at local pharmacy or Health Dept. Verbalized acceptance and understanding.  Tdap: done 04/17/12  Flu Vaccine: last 05/29/19  Pneumococcal Vaccine: done 04/17/17  Covid-19 Vaccine: Completed vaccines 11/03/19 and 11/25/19  Screening Tests Health Maintenance  Topic Date Due  .  INFLUENZA VACCINE  04/19/2020  . COLONOSCOPY  11/17/2021  . TETANUS/TDAP  04/17/2022  . COVID-19 Vaccine  Completed  . Hepatitis C Screening  Completed  . PNA vac Low Risk Adult  Completed   Cancer Screenings  Colorectal Screening: Completed 02/24/11. Repeat every 10 years       Plan:  I have personally reviewed and addressed the Medicare Annual Wellness questionnaire and have noted the following in the patient's chart:  A. Medical and social history B. Use of alcohol, tobacco or illicit drugs  C. Current medications and supplements D. Functional ability and status E.  Nutritional status F.  Physical activity G. Advance directives H. List of other physicians I.  Hospitalizations, surgeries, and ER visits in previous 12 months J.  [place] such as hearing and vision if needed, cognitive and depression L. Referrals and appointments   In addition, I have reviewed and discussed with patient certain preventive protocols, quality metrics, and best practice recommendations. A written personalized care plan for preventive services as well as general preventive health recommendations were provided to patient.   Please schedule your next medicare wellness visit with me in 1 yr.  Continue to eat heart healthy diet (full of fruits, vegetables, whole grains, lean protein, water--limit salt, fat, and sugar intake) and increase physical activity as tolerated.  Continue doing brain stimulating activities (puzzles, reading, adult coloring books, staying active) to keep memory sharp.   Due to this being a  telephonic visit, the after visit summary with patients personalized plan was offered to patient via mail or my-chart. Patient would like to access on my-chart.   Signed,   Shela Nevin, RN  03/06/2020 Nurse Health Advisor   Nurse Notes:

## 2020-03-06 ENCOUNTER — Encounter: Payer: Self-pay | Admitting: *Deleted

## 2020-03-06 ENCOUNTER — Other Ambulatory Visit: Payer: Self-pay

## 2020-03-06 ENCOUNTER — Ambulatory Visit (INDEPENDENT_AMBULATORY_CARE_PROVIDER_SITE_OTHER): Payer: Medicare Other | Admitting: *Deleted

## 2020-03-06 DIAGNOSIS — Z0000 Encounter for general adult medical examination without abnormal findings: Secondary | ICD-10-CM | POA: Diagnosis not present

## 2020-03-06 NOTE — Patient Instructions (Signed)
Please schedule your next medicare wellness visit with me in 1 yr.  Continue to eat heart healthy diet (full of fruits, vegetables, whole grains, lean protein, water--limit salt, fat, and sugar intake) and increase physical activity as tolerated.  Continue doing brain stimulating activities (puzzles, reading, adult coloring books, staying active) to keep memory sharp.    Chris Welch , Thank you for taking time to come for your Medicare Wellness Visit. I appreciate your ongoing commitment to your health goals. Please review the following plan we discussed and let me know if I can assist you in the future.   These are the goals we discussed: Goals    . Maintain healthy active lifestyle.       This is a list of the screening recommended for you and due dates:  Health Maintenance  Topic Date Due  . Flu Shot  04/19/2020  . Colon Cancer Screening  11/17/2021  . Tetanus Vaccine  04/17/2022  . COVID-19 Vaccine  Completed  .  Hepatitis C: One time screening is recommended by Center for Disease Control  (CDC) for  adults born from [birth year] through 1965.   Completed  . Pneumonia vaccines  Completed    Preventive Care 66 Years and Older, Male Preventive care refers to lifestyle choices and visits with your health care provider that can promote health and wellness. This includes:  A yearly physical exam. This is also called an annual well check.  Regular dental and eye exams.  Immunizations.  Screening for certain conditions.  Healthy lifestyle choices, such as diet and exercise. What can I expect for my preventive care visit? Physical exam Your health care provider will check:  Height and weight. These may be used to calculate body mass index (BMI), which is a measurement that tells if you are at a healthy weight.  Heart rate and blood pressure.  Your skin for abnormal spots. Counseling Your health care provider may ask you questions about:  Alcohol, tobacco, and drug  use.  Emotional well-being.  Home and relationship well-being.  Sexual activity.  Eating habits.  History of falls.  Memory and ability to understand (cognition).  Work and work Statistician. What immunizations do I need?  Influenza (flu) vaccine  This is recommended every year. Tetanus, diphtheria, and pertussis (Tdap) vaccine  You may need a Td booster every 10 years. Varicella (chickenpox) vaccine  You may need this vaccine if you have not already been vaccinated. Zoster (shingles) vaccine  You may need this after age 2. Pneumococcal conjugate (PCV13) vaccine  One dose is recommended after age 25. Pneumococcal polysaccharide (PPSV23) vaccine  One dose is recommended after age 72. Measles, mumps, and rubella (MMR) vaccine  You may need at least one dose of MMR if you were born in 1957 or later. You may also need a second dose. Meningococcal conjugate (MenACWY) vaccine  You may need this if you have certain conditions. Hepatitis A vaccine  You may need this if you have certain conditions or if you travel or work in places where you may be exposed to hepatitis A. Hepatitis B vaccine  You may need this if you have certain conditions or if you travel or work in places where you may be exposed to hepatitis B. Haemophilus influenzae type b (Hib) vaccine  You may need this if you have certain conditions. You may receive vaccines as individual doses or as more than one vaccine together in one shot (combination vaccines). Talk with your health care provider about  the risks and benefits of combination vaccines. What tests do I need? Blood tests  Lipid and cholesterol levels. These may be checked every 5 years, or more frequently depending on your overall health.  Hepatitis C test.  Hepatitis B test. Screening  Lung cancer screening. You may have this screening every year starting at age 96 if you have a 30-pack-year history of smoking and currently smoke or have  quit within the past 15 years.  Colorectal cancer screening. All adults should have this screening starting at age 52 and continuing until age 32. Your health care provider may recommend screening at age 20 if you are at increased risk. You will have tests every 1-10 years, depending on your results and the type of screening test.  Prostate cancer screening. Recommendations will vary depending on your family history and other risks.  Diabetes screening. This is done by checking your blood sugar (glucose) after you have not eaten for a while (fasting). You may have this done every 1-3 years.  Abdominal aortic aneurysm (AAA) screening. You may need this if you are a current or former smoker.  Sexually transmitted disease (STD) testing. Follow these instructions at home: Eating and drinking  Eat a diet that includes fresh fruits and vegetables, whole grains, lean protein, and low-fat dairy products. Limit your intake of foods with high amounts of sugar, saturated fats, and salt.  Take vitamin and mineral supplements as recommended by your health care provider.  Do not drink alcohol if your health care provider tells you not to drink.  If you drink alcohol: ? Limit how much you have to 0-2 drinks a day. ? Be aware of how much alcohol is in your drink. In the [place] one drink equals one 12 oz bottle of beer (355 mL), one 5 oz glass of wine (148 mL), or one 1 oz glass of hard liquor (44 mL). Lifestyle  Take daily care of your teeth and gums.  Stay active. Exercise for at least 30 minutes on 5 or more days each week.  Do not use any products that contain nicotine or tobacco, such as cigarettes, e-cigarettes, and chewing tobacco. If you need help quitting, ask your health care provider.  If you are sexually active, practice safe sex. Use a condom or other form of protection to prevent STIs (sexually transmitted infections).  Talk with your health care provider about taking a low-dose aspirin  or statin. What's next?  Visit your health care provider once a year for a well check visit.  Ask your health care provider how often you should have your eyes and teeth checked.  Stay up to date on all vaccines. This information is not intended to replace advice given to you by your health care provider. Make sure you discuss any questions you have with your health care provider. Document Revised: 08/30/2018 Document Reviewed: 08/30/2018 Elsevier Patient Education  2020 [employer].

## 2020-03-31 NOTE — Progress Notes (Signed)
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 Past cholecystectomy, appendectomy and left total hip replacement in 2017. He was referred to our office by Dr. Lorelei Pont to schedule an EGD and colonoscopy due to labs at the [facility] clinic showed iron deficiency anemia.  Labs 12/18/2019: Hemoglobin 11.9.  Hematocrit 35.2.  MCV 88.  Platelet 161.  Iron 75. Iron saturation 22.  TIBC 339.  Ferritin 22.  Transferrin 242.  He was started on Ferrous Sulfate 325mg  one tab every M-W-F approximately 6 weeks ago. He reports feeling well. No fatigue. He golfs 4 days weekly. No upper or lower abdominal pain. His GERD symptoms are well controlled on Omeprazole 20 mg daily. He rarely has heartburn. He has infrequent difficulty swallowing since 2008 which he attributes to having right vocal chord paralysis. Approximately once monthly, food gets caught in his throat area, he coughs and the stuck food comes out. He typically chews his food thoroughly and eats slowly. His most recent EGD was 03/26/2014 by Dr. Olevia Perches done in the [facility] which showed grade II reflux esophagitis and a non-obstructing distal esophageal stricture which was dilated and gastritis. His most recent colonoscopy was 02/24/2011 which identified a 51mm or adenomatous colon polyp and diverticulosis to the sigmoid and descending colon.  No family history of esophageal, gastric or colorectal cancer.  EGD 03/26/2014 by Dr. Olevia Perches: 1. Grade 2 reflux esophagitis. Status post biopsies 2. Nonobstructing distal esophageal stricture. Status post post-dilation to 17 mm without  fluoroscopic guidance 3. Mild antral gastritis. Status post biopsies to r/o H.Pylori  02/24/2011 Colonoscopy by Dr. Olevia Perches: A 50mm tubular adenomatous polyps at 25cm Diverticulosis sigmoid and descending colon  Colonoscopy 12/14/2007 by Dr. Collene Mares: 1.  Pandiverticulosis with large sigmoid colon diverticula. 2.  1 small sessile polyp in the rectosigmoid colon. 3.  Sessile polyp distal right colon. 4.  Cecal base and proximal right colonic mucosa not completely visualized.    Past Medical History:  Diagnosis Date  . Anxiety   . Arthritis    "hips, knees" (01/27/2016)  . Complication of anesthesia    bad dreams  . Depression   . Diverticulitis   . Diverticulosis   . Ear infection ear infection lasting past 2 months, still ongoing, pt on antibiotics & drops. pt states "ear infection has eaten holes through bilateral ear drums,  I am hard of hearing"  . Enlarged prostate   . Esophageal stricture   . Gastropathy    reactive  . GERD (gastroesophageal reflux disease)   . Hard of hearing   . Hyperlipidemia   . Hypertension   . Malignant carotid body tumor ([place])   . Pneumonia    x 3, last 2009 (01/27/2016)  . 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 Vocal cord paralysis    Right   Past Surgical History:  Procedure Laterality Date  . APPENDECTOMY    . BACK SURGERY    . CAROTID BODY TUMOR EXCISION    . CERVICAL PARAGANGLIOMA EXCISION Right 12/2006  . CHOLECYSTECTOMY OPEN    . CYST EXCISION Left 12/17/2019   Procedure: CYST EXCISION; EXCISION NAIL  HORN REMNANT LEFT RING FINGER;  Surgeon: Daryll Brod, MD;  Location: [place];  Service: Orthopedics;  Laterality: Left;  IV REGIONAL  . DEEP NECK LYMPH NODE BIOPSY / EXCISION    . DIGIT NAIL REMOVAL Left 07/30/2019   Procedure: REMOVAL OF NAIL HORNS LEFT RING FINGER;  Surgeon: Daryll Brod, MD;  Location: [place];  Service: Orthopedics;  Laterality: Left;  IV REGIONAL FOREARM  BLOCK  . ESOPHAGOGASTRODUODENOSCOPY (EGD) WITH ESOPHAGEAL DILATION    . INGUINAL HERNIA REPAIR Right 1954  . JOINT REPLACEMENT    . KNEE ARTHROSCOPY Right   . LUMBAR LAMINECTOMY/DECOMPRESSION MICRODISCECTOMY Left 03/09/2013   Procedure: LUMBAR LAMINECTOMY/DECOMPRESSION MICRODISCECTOMY 1 LEVEL;  Surgeon: Eustace Moore, MD;  Location: [place] [facility];  Service: Neurosurgery;  Laterality: Left;  Left lumbar three-four extra-foraminal microdiscectomy  . PATELLA ARTHROPLASTY Right    has had 5 surgeries right knee  . TOTAL HIP ARTHROPLASTY Left 01/27/2016  . TOTAL HIP ARTHROPLASTY Left 01/27/2016   Procedure: TOTAL HIP ARTHROPLASTY ANTERIOR APPROACH;  Surgeon: Frederik Pear, MD;  Location: [place];  Service: Orthopedics;  Laterality: Left;  [name] Kitchen VOCAL CORD IMPLANT      Social History: Married. Retired Tax adviser. Two sons and two daughters. Nonsmoker. He drinks 2 or 3 beers every 2 weeks or less. No drug use.   Family History: Brother died age 26 rare progressive liver cancer. Father died 46 emphysema. Mother died age 27 with history of heart disease.    Allergies  Allergen Reactions  . Hydrocodone Nausea And Vomiting    "can not take on an empty stomach"  . Penicillins Other (See Comments) and Rash    Ulcers on eyes      Outpatient Encounter Medications as of 04/01/2020  Medication Sig  . albuterol (PROVENTIL HFA;VENTOLIN HFA) 108 (90 Base) MCG/ACT inhaler Inhale 2 puffs into the lungs every 6 (six) hours as needed for wheezing or shortness of breath.  [name] Kitchen aspirin EC 81 MG tablet Take 81 mg by mouth daily.  . bisacodyl (DULCOLAX) 5 MG EC tablet Take 5 mg by mouth in the morning and at bedtime.  [name] Kitchen buPROPion (WELLBUTRIN XL) 150 MG 24 hr tablet Take 3 tablets (450 mg total) by mouth daily.  . finasteride (PROSCAR) 5 MG tablet Take 1 tablet (5 mg total) by mouth daily.  . fluticasone (FLONASE) 50 MCG/ACT nasal spray Place 2 sprays into both nostrils daily.  [name] Kitchen glycopyrrolate (ROBINUL) 1 MG tablet  Take 1 tablet (1 mg total) by mouth 3 (three) times daily.  [name] Kitchen losartan (COZAAR) 50 MG tablet TAKE 1 TABLET BY MOUTH ONCE DAILY  . omeprazole (PRILOSEC) 40 MG capsule TAKE 1 CAPSULE BY MOUTH 2 TIMES DAILY.  [name] Kitchen POLYETHYLENE GLYCOL 3350 PO Take by mouth. Take entire contents by mouth once (Mix with water or other liquid as instructed before drinking)  . pravastatin (PRAVACHOL) 20 MG tablet Take 1 tablet (20 mg total) by mouth daily.  . prazosin (MINIPRESS) 2 MG capsule Take 2 mg by mouth at bedtime.  . tamsulosin (FLOMAX) 0.4 MG CAPS capsule Take by mouth.  . traMADol (ULTRAM) 50 MG tablet Take 1 tablet (50 mg total) by mouth every 6 (six) hours as needed.  . traZODone (DESYREL) 50 MG tablet TAKE 1 TABLET BY MOUTH AT BEDTIME  . VIAGRA 100 MG tablet TAKE 1/2-1 TABLET BY MOUTH DAILY AS NEEDED FOR ERECTILE DYSFUNCTION   No facility-administered encounter medications on file as of 04/01/2020.     REVIEW OF  SYSTEMS:  Gen: Denies fever, sweats or chills. No weight loss.  CV: Denies chest pain, palpitations or edema. Resp: Denies cough, shortness of breath of hemoptysis.  GI: Denies heartburn, dysphagia, stomach or lower abdominal pain. No diarrhea or constipation.  GU : Denies urinary burning, blood in urine, increased urinary frequency or incontinence. MS: Denies joint pain, muscles aches or weakness. Derm: Denies rash, itchiness, skin lesions or unhealing ulcers. Psych: + anxiety and depression. Heme: Denies bruising, bleeding. Neuro:  Denies headaches, dizziness or paresthesias. Endo:  Denies any problems with DM, thyroid or adrenal function.    PHYSICAL EXAM: BP 100/66 (BP Location: Left Arm, Patient Position: Sitting, Cuff Size: Normal)   Pulse 72   Ht 6' (1.829 m) Comment: height measured without shoes  Wt 223 lb 2 oz (101.2 kg)   BMI 30.26 kg/m  General: Well developed  74 year old male in no acute distress. Head: Normocephalic and atraumatic. Eyes:  Sclerae non-icteric,  conjunctive pink. Ears: Normal auditory acuity. Mouth: Dentition intact. No ulcers or lesions.  Neck: Supple, no lymphadenopathy or thyromegaly.  Lungs: Clear bilaterally to auscultation without wheezes, crackles or rhonchi. Heart: Regular rate and rhythm. No murmur, rub or gallop appreciated.  Abdomen: Soft, nontender, non distended. No masses. No hepatosplenomegaly. Normoactive bowel sounds x 4 quadrants. Large RUQ scar intact.  Rectal: Deferred.  Musculoskeletal: Symmetrical with no gross deformities. Skin: Warm and dry. No rash or lesions on visible extremities. Extremities: No edema. Neurological: Alert oriented x 4, no focal deficits.  Psychological:  Alert and cooperative. Normal mood and affect.  ASSESSMENT AND PLAN:  45. 74 year old male iron deficiency anemia.  -CBC, tTG, IgA, iron, iron saturation, TIBC, Ferritin, Vitamin B12 and Folate  -EGD and colonoscopy benefits and risks discussed including risk with sedation, risk of bleeding, perforation and infection  -Patient to hold ferrous sulfate for 1 week prior to colonoscopy prep  2.  History of tubular adenomatous colon polyps -See plan in #1  3.  History of GERD, stable.  Past distal esophageal stricture.  Infrequent dysphagia in the setting of chronic vocal cord paralysis. -Continue omeprazole 20 mg daily -See plan in #1  4. History of MGUS followed by [facility] hematologist in [place]          CC:  Copland, Gay Filler, MD

## 2020-04-01 ENCOUNTER — Ambulatory Visit (INDEPENDENT_AMBULATORY_CARE_PROVIDER_SITE_OTHER): Payer: Medicare Other | Admitting: Nurse Practitioner

## 2020-04-01 ENCOUNTER — Encounter: Payer: Self-pay | Admitting: Nurse Practitioner

## 2020-04-01 ENCOUNTER — Other Ambulatory Visit (INDEPENDENT_AMBULATORY_CARE_PROVIDER_SITE_OTHER): Payer: Medicare Other

## 2020-04-01 VITALS — BP 100/66 | HR 72 | Ht 72.0 in | Wt 223.1 lb

## 2020-04-01 DIAGNOSIS — Z86010 Personal history of colonic polyps: Secondary | ICD-10-CM

## 2020-04-01 DIAGNOSIS — D509 Iron deficiency anemia, unspecified: Secondary | ICD-10-CM

## 2020-04-01 DIAGNOSIS — K219 Gastro-esophageal reflux disease without esophagitis: Secondary | ICD-10-CM

## 2020-04-01 LAB — CBC WITH DIFFERENTIAL/PLATELET
Basophils Absolute: 0.1 10*3/uL (ref 0.0–0.1)
Basophils Relative: 0.9 % (ref 0.0–3.0)
Eosinophils Absolute: 0.1 10*3/uL (ref 0.0–0.7)
Eosinophils Relative: 2.2 % (ref 0.0–5.0)
HCT: 37.6 % — ABNORMAL LOW (ref 39.0–52.0)
Hemoglobin: 12.9 g/dL — ABNORMAL LOW (ref 13.0–17.0)
Lymphocytes Relative: 14.6 % (ref 12.0–46.0)
Lymphs Abs: 1 10*3/uL (ref 0.7–4.0)
MCHC: 34.3 g/dL (ref 30.0–36.0)
MCV: 88.7 fl (ref 78.0–100.0)
Monocytes Absolute: 0.5 10*3/uL (ref 0.1–1.0)
Monocytes Relative: 7.2 % (ref 3.0–12.0)
Neutro Abs: 5.1 10*3/uL (ref 1.4–7.7)
Neutrophils Relative %: 75.1 % (ref 43.0–77.0)
Platelets: 154 10*3/uL (ref 150.0–400.0)
RBC: 4.23 Mil/uL (ref 4.22–5.81)
RDW: 13.6 % (ref 11.5–15.5)
WBC: 6.7 10*3/uL (ref 4.0–10.5)

## 2020-04-01 LAB — COMPREHENSIVE METABOLIC PANEL
ALT: 20 U/L (ref 0–53)
AST: 16 U/L (ref 0–37)
Albumin: 4.2 g/dL (ref 3.5–5.2)
Alkaline Phosphatase: 85 U/L (ref 39–117)
BUN: 23 mg/dL (ref 6–23)
CO2: 27 mEq/L (ref 19–32)
Calcium: 9 mg/dL (ref 8.4–10.5)
Chloride: 102 mEq/L (ref 96–112)
Creatinine, Ser: 1.28 mg/dL (ref 0.40–1.50)
GFR: 55.01 mL/min — ABNORMAL LOW (ref >60.00)
Glucose, Bld: 99 mg/dL (ref 70–99)
Potassium: 4.4 mEq/L (ref 3.5–5.1)
Sodium: 138 mEq/L (ref 135–145)
Total Bilirubin: 1.1 mg/dL (ref 0.2–1.2)
Total Protein: 7.8 g/dL (ref 6.0–8.3)

## 2020-04-01 LAB — FERRITIN: Ferritin: 64.9 ng/mL (ref 22.0–322.0)

## 2020-04-01 LAB — IGA: IgA: 91 mg/dL (ref 68–378)

## 2020-04-01 MED ORDER — NA SULFATE-K SULFATE-MG SULF 17.5-3.13-1.6 GM/177ML PO SOLN: 1.0000 | Freq: Once | ORAL | 0 refills | Status: DC

## 2020-04-01 MED ORDER — NA SULFATE-K SULFATE-MG SULF 17.5-3.13-1.6 GM/177ML PO SOLN: 1.0000 | Freq: Once | ORAL | 0 refills | Status: AC

## 2020-04-01 MED FILL — SUPREP BOWEL PREP KIT: 17.5-3.13-1 | 1 days supply | Qty: 354 | Fill #0

## 2020-04-01 NOTE — Patient Instructions (Signed)
If you are age 74 or older, your body mass index should be between 23-30. Your Body mass index is 30.26 kg/m. If this is out of the aforementioned range listed, please consider follow up with your Primary Care Provider.  If you are age 23 or younger, your body mass index should be between 19-25. Your Body mass index is 30.26 kg/m. If this is out of the aformentioned range listed, please consider follow up with your Primary Care Provider.   Hold your Ferrous Sulfate (Iron) for 1 week prior to your procedure.  We have sent the following medications to your pharmacy for you to pick up at your convenience:  Suprep    Due to recent changes in healthcare laws, you may see the results of your imaging and laboratory studies on MyChart before your provider has had a chance to review them.  We understand that in some cases there may be results that are confusing or concerning to you. Not all laboratory results come back in the same time frame and the provider may be waiting for multiple results in order to interpret others.  Please give [place] 48 hours in order for your provider to thoroughly review all the results before contacting the office for clarification of your results.

## 2020-04-02 LAB — IRON, TOTAL/TOTAL IRON BINDING CAP
%SAT: 37 % (calc) (ref 20–48)
Iron: 115 ug/dL (ref 50–180)
TIBC: 313 mcg/dL (calc) (ref 250–425)

## 2020-04-02 LAB — TISSUE TRANSGLUTAMINASE, IGA: (tTG) Ab, IgA: 1 U/mL

## 2020-04-18 DIAGNOSIS — R42 Dizziness and giddiness: Secondary | ICD-10-CM | POA: Diagnosis not present

## 2020-04-18 DIAGNOSIS — R531 Weakness: Secondary | ICD-10-CM | POA: Diagnosis not present

## 2020-04-18 DIAGNOSIS — R55 Syncope and collapse: Secondary | ICD-10-CM | POA: Diagnosis not present

## 2020-04-21 NOTE — Progress Notes (Signed)
Reviewed and agree with documentation and assessment and plan. K. Veena Senya Hinzman , MD   

## 2020-05-20 ENCOUNTER — Encounter: Payer: Self-pay | Admitting: Gastroenterology

## 2020-05-20 ENCOUNTER — Encounter (INDEPENDENT_AMBULATORY_CARE_PROVIDER_SITE_OTHER): Payer: Medicare Other | Admitting: Ophthalmology

## 2020-05-20 ENCOUNTER — Other Ambulatory Visit: Payer: Self-pay

## 2020-05-20 ENCOUNTER — Ambulatory Visit (AMBULATORY_SURGERY_CENTER): Payer: Medicare Other | Admitting: Gastroenterology

## 2020-05-20 VITALS — BP 133/85 | HR 64 | Temp 97.4°F | Resp 11 | Ht 72.0 in | Wt 223.0 lb

## 2020-05-20 DIAGNOSIS — K222 Esophageal obstruction: Secondary | ICD-10-CM

## 2020-05-20 DIAGNOSIS — K319 Disease of stomach and duodenum, unspecified: Secondary | ICD-10-CM

## 2020-05-20 DIAGNOSIS — R1310 Dysphagia, unspecified: Secondary | ICD-10-CM | POA: Diagnosis not present

## 2020-05-20 DIAGNOSIS — D122 Benign neoplasm of ascending colon: Secondary | ICD-10-CM | POA: Diagnosis not present

## 2020-05-20 DIAGNOSIS — K3189 Other diseases of stomach and duodenum: Secondary | ICD-10-CM | POA: Diagnosis not present

## 2020-05-20 DIAGNOSIS — D509 Iron deficiency anemia, unspecified: Secondary | ICD-10-CM

## 2020-05-20 DIAGNOSIS — D123 Benign neoplasm of transverse colon: Secondary | ICD-10-CM

## 2020-05-20 DIAGNOSIS — D120 Benign neoplasm of cecum: Secondary | ICD-10-CM | POA: Diagnosis not present

## 2020-05-20 DIAGNOSIS — K5730 Diverticulosis of large intestine without perforation or abscess without bleeding: Secondary | ICD-10-CM

## 2020-05-20 DIAGNOSIS — K2970 Gastritis, unspecified, without bleeding: Secondary | ICD-10-CM

## 2020-05-20 DIAGNOSIS — K5520 Angiodysplasia of colon without hemorrhage: Secondary | ICD-10-CM | POA: Diagnosis not present

## 2020-05-20 DIAGNOSIS — K648 Other hemorrhoids: Secondary | ICD-10-CM

## 2020-05-20 MED ORDER — SODIUM CHLORIDE 0.9 % IV SOLN: 500.0000 mL | Freq: Once | INTRAVENOUS | Status: DC

## 2020-05-20 NOTE — Progress Notes (Signed)
Called to room to assist during endoscopic procedure.  Patient ID and intended procedure confirmed with present staff. Received instructions for my participation in the procedure from the performing physician.  

## 2020-05-20 NOTE — Op Note (Signed)
[place] Patient Name: Chris Welch Procedure Date: 05/20/2020 2:10 PM MRN: [id number] Endoscopist: Mauri Pole , MD Age: 74 Referring MD:  Date of Birth: [birth year] Gender: Male Account #: [id number] Procedure:                Upper GI endoscopy Indications:              Suspected upper gastrointestinal bleeding in                            patient with unexplained iron deficiency anemia,                            Dysphagia Medicines:                Monitored Anesthesia Care Procedure:                Pre-Anesthesia Assessment:                           - Prior to the procedure, a History and Physical                            was performed, and patient medications and                            allergies were reviewed. The patient's tolerance of                            previous anesthesia was also reviewed. The risks                            and benefits of the procedure and the sedation                            options and risks were discussed with the patient.                            All questions were answered, and informed consent                            was obtained. Prior Anticoagulants: The patient has                            taken no previous anticoagulant or antiplatelet                            agents. ASA Grade Assessment: III - A patient with                            severe systemic disease. After reviewing the risks                            and benefits, the patient was deemed in  satisfactory condition to undergo the procedure.                           After obtaining informed consent, the endoscope was                            passed under direct vision. Throughout the                            procedure, the patient's blood pressure, pulse, and                            oxygen saturations were monitored continuously. The                            Endoscope was introduced through the  mouth, and                            advanced to the second part of duodenum. The upper                            GI endoscopy was accomplished without difficulty.                            The patient tolerated the procedure well. Scope In: Scope Out: Findings:                 One benign-appearing, intrinsic mild stenosis was                            found 36 to 37 cm from the incisors. This stenosis                            measured 1.8 cm (inner diameter) x less than one cm                            (in length). The stenosis was traversed. A TTS                            dilator was passed through the scope. Dilation with                            an 18-19-20 mm balloon dilator was performed to 20                            mm. The dilation site was examined following                            endoscope reinsertion and showed no change.                           Patchy moderate inflammation characterized by  congestion (edema) and erythema was found in the                            gastric antrum and in the prepyloric region of the                            stomach. Biopsies were taken with a cold forceps                            for Helicobacter pylori testing.                           The first portion of the duodenum and second                            portion of the duodenum were normal.                           The cardia and gastric fundus were normal on                            retroflexion. Complications:            No immediate complications. Estimated Blood Loss:     Estimated blood loss was minimal. Impression:               - Benign-appearing esophageal stenosis. Dilated.                           - Gastritis. Biopsied.                           - Normal first portion of the duodenum and second                            portion of the duodenum. Recommendation:           - Patient has a contact number available for                             emergencies. The signs and symptoms of potential                            delayed complications were discussed with the                            patient. Return to normal activities tomorrow.                            Written discharge instructions were provided to the                            patient.                           - Resume previous diet.                           -  Continue present medications.                           - Await pathology results.                           - See the other procedure note for documentation of                            additional recommendations. Mauri Pole, MD 05/20/2020 2:55:26 PM This report has been signed electronically.

## 2020-05-20 NOTE — Op Note (Signed)
[place] Patient Name: Shann Merrick Procedure Date: 05/20/2020 2:09 PM MRN: [id number] Endoscopist: Mauri Pole , MD Age: 74 Referring MD:  Date of Birth: July 19, 1946 Gender: Male Account #: [id number] Procedure:                Colonoscopy Indications:              Unexplained iron deficiency anemia Medicines:                Monitored Anesthesia Care Procedure:                Pre-Anesthesia Assessment:                           - Prior to the procedure, a History and Physical                            was performed, and patient medications and                            allergies were reviewed. The patient's tolerance of                            previous anesthesia was also reviewed. The risks                            and benefits of the procedure and the sedation                            options and risks were discussed with the patient.                            All questions were answered, and informed consent                            was obtained. Prior Anticoagulants: The patient has                            taken no previous anticoagulant or antiplatelet                            agents. ASA Grade Assessment: III - A patient with                            severe systemic disease. After reviewing the risks                            and benefits, the patient was deemed in                            satisfactory condition to undergo the procedure.                           After obtaining informed consent, the colonoscope  was passed under direct vision. Throughout the                            procedure, the patient's blood pressure, pulse, and                            oxygen saturations were monitored continuously. The                            Colonoscope was introduced through the anus and                            advanced to the the cecum, identified by                            appendiceal orifice and  ileocecal valve. The                            colonoscopy was performed without difficulty. The                            patient tolerated the procedure well. The quality                            of the bowel preparation was good. The ileocecal                            valve, appendiceal orifice, and rectum were                            photographed. Scope In: 2:31:08 PM Scope Out: 2:48:25 PM Scope Withdrawal Time: 0 hours 12 minutes 40 seconds  Total Procedure Duration: 0 hours 17 minutes 17 seconds  Findings:                 The perianal and digital rectal examinations were                            normal.                           A single small localized angioectasia without                            bleeding was found in the cecum.                           Two sessile polyps were found in the ascending                            colon and cecum. The polyps were 1 to 2 mm in size.                            These polyps were removed with a cold biopsy  forceps. Resection and retrieval were complete.                           Two sessile polyps were found in the transverse                            colon. The polyps were 4 to 10 mm in size. These                            polyps were removed with a cold snare. Resection                            and retrieval were complete.                           Scattered small and large-mouthed diverticula were                            found in the sigmoid colon, descending colon,                            transverse colon and ascending colon.                           Non-bleeding internal hemorrhoids were found during                            retroflexion. The hemorrhoids were small. Complications:            No immediate complications. Estimated Blood Loss:     Estimated blood loss was minimal. Impression:               - A single non-bleeding colonic angioectasia.                           -  Two 1 to 2 mm polyps in the ascending colon and                            in the cecum, removed with a cold biopsy forceps.                            Resected and retrieved.                           - Two 4 to 10 mm polyps in the transverse colon,                            removed with a cold snare. Resected and retrieved.                           - Diverticulosis in the sigmoid colon, in the                            descending colon, in the transverse colon and in  the ascending colon.                           - Non-bleeding internal hemorrhoids. Recommendation:           - Patient has a contact number available for                            emergencies. The signs and symptoms of potential                            delayed complications were discussed with the                            patient. Return to normal activities tomorrow.                            Written discharge instructions were provided to the                            patient.                           - Resume previous diet.                           - Continue present medications.                           - Await pathology results.                           - Repeat colonoscopy in 3 - 5 years for                            surveillance based on pathology results. Mauri Pole, MD 05/20/2020 2:58:33 PM This report has been signed electronically.

## 2020-05-20 NOTE — Progress Notes (Signed)
PT taken to PACU. Monitors in place. VSS. Report given to RN. 

## 2020-05-20 NOTE — Progress Notes (Signed)
VS- Chris Welch  Pt states he completed covid vaccines- Pfizer  Pt has internal nasopharyngeal sutures in place- since 2008.  Made CRNA, Kassie Mends, aware of this

## 2020-05-20 NOTE — Patient Instructions (Signed)
Thank you for allowing [place] to care for you today!  Resume previous diet and medications today.    Resume your normal activities tomorrow.  Biopsy results within 7-10 days by mail.   YOU HAD AN ENDOSCOPIC PROCEDURE TODAY AT [facility]:   Refer to the procedure report that was given to you for any specific questions about what was found during the examination.  If the procedure report does not answer your questions, please call your gastroenterologist to clarify.  If you requested that your care partner not be given the details of your procedure findings, then the procedure report has been included in a sealed envelope for you to review at your convenience later.  YOU SHOULD EXPECT: Some feelings of bloating in the abdomen. Passage of more gas than usual.  Walking can help get rid of the air that was put into your GI tract during the procedure and reduce the bloating. If you had a lower endoscopy (such as a colonoscopy or flexible sigmoidoscopy) you may notice spotting of blood in your stool or on the toilet paper. If you underwent a bowel prep for your procedure, you may not have a normal bowel movement for a few days.  Please Note:  You might notice some irritation and congestion in your nose or some drainage.  This is from the oxygen used during your procedure.  There is no need for concern and it should clear up in a day or so.  SYMPTOMS TO REPORT IMMEDIATELY:   Following lower endoscopy (colonoscopy or flexible sigmoidoscopy):  Excessive amounts of blood in the stool  Significant tenderness or worsening of abdominal pains  Swelling of the abdomen that is new, acute  Fever of 100F or higher   Following upper endoscopy (EGD)  Vomiting of blood or coffee ground material  New chest pain or pain under the shoulder blades  Painful or persistently difficult swallowing  New shortness of breath  Fever of 100F or higher  Black, tarry-looking stools  For urgent or emergent  issues, a gastroenterologist can be reached at any hour by calling [phone]. Do not use MyChart messaging for urgent concerns.    DIET:  We do recommend a small meal at first, but then you may proceed to your regular diet.  Drink plenty of fluids but you should avoid alcoholic beverages for 24 hours.  ACTIVITY:  You should plan to take it easy for the rest of today and you should NOT DRIVE or use heavy machinery until tomorrow (because of the sedation medicines used during the test).    FOLLOW UP: Our staff will call the number listed on your records 48-72 hours following your procedure to check on you and address any questions or concerns that you may have regarding the information given to you following your procedure. If we do not reach you, we will leave a message.  We will attempt to reach you two times.  During this call, we will ask if you have developed any symptoms of COVID 19. If you develop any symptoms (ie: fever, flu-like symptoms, shortness of breath, cough etc.) before then, please call [phone].  If you test positive for Covid 19 in the 2 weeks post procedure, please call and report this information to [place].    If any biopsies were taken you will be contacted by phone or by letter within the next 1-3 weeks.  Please call us at [phone] if you have not heard about the biopsies in 3  weeks.    SIGNATURES/CONFIDENTIALITY: You and/or your care partner have signed paperwork which will be entered into your electronic medical record.  These signatures attest to the fact that that the information above on your After Visit Summary has been reviewed and is understood.  Full responsibility of the confidentiality of this discharge information lies with you and/or your care-partner.

## 2020-05-22 ENCOUNTER — Telehealth: Payer: Self-pay

## 2020-05-22 NOTE — Telephone Encounter (Signed)
°  Follow up Call-  Call back number 05/20/2020  Post procedure Call Back phone  # [phone]  Permission to leave phone message Yes  Some recent data might be hidden     Patient questions:  Do you have a fever, pain , or abdominal swelling? No. Pain Score  0 *  Have you tolerated food without any problems? Yes.    Have you been able to return to your normal activities? Yes.    Do you have any questions about your discharge instructions: Diet   No. Medications  No. Follow up visit  No.  Do you have questions or concerns about your Care? No.  Actions: * If pain score is 4 or above: No action needed, pain <4.  1. Have you developed a fever since your procedure? no  2.   Have you had an respiratory symptoms (SOB or cough) since your procedure? no  3.   Have you tested positive for COVID 19 since your procedure no  4.   Have you had any family members/close contacts diagnosed with the COVID 19 since your procedure?  no   If yes to any of these questions please route to Joylene John, RN and Joella Prince, RN

## 2020-05-28 ENCOUNTER — Telehealth: Payer: Self-pay | Admitting: Family Medicine

## 2020-05-28 ENCOUNTER — Encounter: Payer: Self-pay | Admitting: Family Medicine

## 2020-05-28 DIAGNOSIS — R911 Solitary pulmonary nodule: Secondary | ICD-10-CM

## 2020-05-28 DIAGNOSIS — R918 Other nonspecific abnormal finding of lung field: Secondary | ICD-10-CM

## 2020-05-28 NOTE — Telephone Encounter (Signed)
Patient was seen in the emergency room on [address] recently, had a chest x-ray which showed a nodular density right suprahilar region.  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

## 2020-06-01 NOTE — Progress Notes (Signed)
[facility] Note  06/03/2020     CHIEF COMPLAINT Patient presents for Retina Follow Up   HISTORY OF PRESENT ILLNESS: Chris Welch is a 74 y.o. male who presents to the clinic today for:   HPI    Retina Follow Up    Patient presents with  PVD.  In both eyes.  This started years ago.  Severity is moderate.  Duration of years.  Since onset it is stable.  I, the attending physician,  performed the HPI with the patient and updated documentation appropriately.          Comments    Pt states vision is still pretty good OU.  Patient denies eye pain or discomfort.  Patient still has persistent floaters OU, he denies flashes of light.       Last edited by Bernarda Caffey, MD on 06/03/2020  1:11 PM. (History)    pt states he has seen Dr. Katy Fitch this year for new glasses, he states he has not had cataract sx yet, he denies fol, he has the same floaters, but they have not gotten any worse, no new health concerns   Referring physician: Warden Fillers, [address] [place],  [place] [phone]  HISTORICAL INFORMATION:   Selected notes from the MEDICAL RECORD NUMBER Referred by Dr. Shirleen Schirmer for concern of VH and possible tear OS;  LEE-  Ocular Hx-  PMH- arthritis,     CURRENT MEDICATIONS: No current outpatient medications on file. (Ophthalmic Drugs)   No current facility-administered medications for this visit. (Ophthalmic Drugs)   Current Outpatient Medications (Other)  Medication Sig  . albuterol (PROVENTIL HFA;VENTOLIN HFA) 108 (90 Base) MCG/ACT inhaler Inhale 2 puffs into the lungs every 6 (six) hours as needed for wheezing or shortness of breath.  [name] Kitchen aspirin EC 81 MG tablet Take 81 mg by mouth daily.  . bisacodyl (DULCOLAX) 5 MG EC tablet Take 5 mg by mouth in the morning and at bedtime.  [name] Kitchen buPROPion (WELLBUTRIN XL) 150 MG 24 hr tablet Take 3 tablets (450 mg total) by mouth daily.  . finasteride (PROSCAR) 5 MG tablet Take 1 tablet  (5 mg total) by mouth daily.  . fluticasone (FLONASE) 50 MCG/ACT nasal spray Place 2 sprays into both nostrils daily.  [name] Kitchen glycopyrrolate (ROBINUL) 1 MG tablet Take 1 tablet (1 mg total) by mouth 3 (three) times daily.  [name] Kitchen losartan (COZAAR) 50 MG tablet TAKE 1 TABLET BY MOUTH ONCE DAILY  . omeprazole (PRILOSEC) 40 MG capsule TAKE 1 CAPSULE BY MOUTH 2 TIMES DAILY.  . pravastatin (PRAVACHOL) 20 MG tablet Take 1 tablet (20 mg total) by mouth daily.  . prazosin (MINIPRESS) 2 MG capsule Take 2 mg by mouth at bedtime.  . tamsulosin (FLOMAX) 0.4 MG CAPS capsule Take by mouth.  . traMADol (ULTRAM) 50 MG tablet Take 1 tablet (50 mg total) by mouth every 6 (six) hours as needed.  . traZODone (DESYREL) 50 MG tablet TAKE 1 TABLET BY MOUTH AT BEDTIME  . VIAGRA 100 MG tablet TAKE 1/2-1 TABLET BY MOUTH DAILY AS NEEDED FOR ERECTILE DYSFUNCTION   No current facility-administered medications for this visit. (Other)      REVIEW OF SYSTEMS: ROS    Positive for: Musculoskeletal, Eyes   Negative for: Constitutional, Gastrointestinal, Neurological, Skin, Genitourinary, HENT, Endocrine, Cardiovascular, Respiratory, Psychiatric, Allergic/Imm, Heme/Lymph   Last edited by Doneen Poisson on 06/03/2020  9:16 AM. (History)  ALLERGIES Allergies  Allergen Reactions  . Hydrocodone Nausea And Vomiting    "can not take on an empty stomach"  . Penicillins Other (See Comments) and Rash    Ulcers on eyes    PAST MEDICAL HISTORY Past Medical History:  Diagnosis Date  . Adenomatous colon polyp 2009  . Anxiety   . Arthritis    "hips, knees" (01/27/2016)  . Complication of anesthesia    bad dreams  . Depression   . Diverticulitis   . Diverticulosis   . Ear infection ear infection lasting past 2 months, still ongoing, pt on antibiotics & drops. pt states "ear infection has eaten holes through bilateral ear drums,  I am hard of hearing"  . Enlarged prostate   . Esophageal stricture   . Gastropathy     reactive  . GERD (gastroesophageal reflux disease)   . Hard of hearing   . Hyperlipidemia   . Hypertension   . 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 Pneumonia    x 3, last 2009 (01/27/2016)  . 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 Vocal cord paralysis    Right   Past Surgical History:  Procedure Laterality Date  . APPENDECTOMY    . BACK SURGERY    . CAROTID BODY TUMOR EXCISION    . CERVICAL PARAGANGLIOMA EXCISION Right 12/2006  . CHOLECYSTECTOMY OPEN    . CYST EXCISION Left 12/17/2019   Procedure: CYST EXCISION; EXCISION NAIL HORN REMNANT LEFT RING FINGER;  Surgeon: Daryll Brod, MD;  Location: [facility];  Service: Orthopedics;  Laterality: Left;  IV REGIONAL  . DEEP NECK LYMPH NODE BIOPSY / EXCISION    . DIGIT NAIL REMOVAL Left 07/30/2019   Procedure: REMOVAL OF NAIL HORNS LEFT RING FINGER;  Surgeon: Daryll Brod, MD;  Location: [facility];  Service: Orthopedics;  Laterality: Left;  IV REGIONAL FOREARM BLOCK  . ESOPHAGOGASTRODUODENOSCOPY (EGD) WITH ESOPHAGEAL DILATION    . INGUINAL HERNIA REPAIR Right 1954  . JOINT REPLACEMENT    . KNEE ARTHROSCOPY Right   . LUMBAR LAMINECTOMY/DECOMPRESSION MICRODISCECTOMY Left 03/09/2013   Procedure: LUMBAR LAMINECTOMY/DECOMPRESSION MICRODISCECTOMY 1 LEVEL;  Surgeon: Eustace Moore, MD;  Location: [facility];  Service: Neurosurgery;  Laterality: Left;  Left lumbar three-four extra-foraminal microdiscectomy  . PATELLA ARTHROPLASTY Right    has had 5 surgeries right knee  . TOTAL HIP ARTHROPLASTY Left 01/27/2016  . TOTAL HIP ARTHROPLASTY Left 01/27/2016   Procedure: TOTAL HIP ARTHROPLASTY ANTERIOR APPROACH;  Surgeon: Frederik Pear, MD;  Location: [facility];  Service: Orthopedics;  Laterality: Left;  . UPPER GASTROINTESTINAL ENDOSCOPY    . VOCAL CORD IMPLANT      FAMILY HISTORY Family History  Problem Relation Age of Onset  . Arthritis Brother        back  . Emphysema Brother    . Cancer Brother        rare cancer-unknown type  . Heart disease Mother   . Emphysema Father        smoker  . Colon cancer Neg Hx   . Esophageal cancer Neg Hx   . Rectal cancer Neg Hx   . Stomach cancer Neg Hx     SOCIAL HISTORY Social History   Tobacco Use  . Smoking status: Never Smoker  . Smokeless tobacco: Never Used  Vaping Use  . Vaping Use: Never used  Substance Use Topics  . Alcohol use: Yes  Alcohol/week: 3.0 standard drinks    Types: 3 Cans of beer per week    Comment: social 2 per week  . Drug use: No         OPHTHALMIC EXAM:  Base Eye Exam    Visual Acuity (Snellen - Linear)      Right Left   Dist cc 20/30 -2 20/30 -1   Dist ph cc NI NI   Correction: Glasses       Tonometry (Tonopen, 9:21 AM)      Right Left   Pressure 12 13       Pupils      Dark Light Shape React APD   Right 3 2 Round Brisk 0   Left 3 2 Round Brisk 0       Visual Fields      Left Right    Full Full       Extraocular Movement      Right Left    Full Full       Neuro/Psych    Oriented x3: Yes   Mood/Affect: Normal       Dilation    Both eyes: 1.0% Mydriacyl, 2.5% Phenylephrine @ 9:21 AM        Slit Lamp and Fundus Exam    Slit Lamp Exam      Right Left   Lids/Lashes Dermatochalasis - upper lid Dermatochalasis - upper lid, Telangiectasia vessels, Ptosis   Conjunctiva/Sclera Nasal and temporal Pinguecula Temporal Pinguecula   Cornea Mild Arcus, otherwise clear Mild Arcus, 1+ Punctate epithelial erosions   Anterior Chamber Deep and quiet Deep and quiet   Iris Round and poorly dilated to 4.5 mm Round and moderately dilated to 5.46mm   Lens 3+ Nuclear sclerosis, 2-3+ Cortical cataract, Vacuoles 3+ Nuclear sclerosis, 2-3+ Cortical cataract, Vacuoles   Vitreous Vitreous syneresis, Posterior vitreous detachment with Mariel Kansky ring, vitreous condensations inferiorly Posterior vitreous detachment, Weiss ring-settling inferiorly        Fundus Exam      Right Left    Disc pink and sharp Pink and Sharp, focal PPP   C/D Ratio 0.4 0.3   Macula Flat, blunted foveal reflex, Mild Retinal pigment epithelial mottling and clumping, No heme or edema Flat, Blunted foveal reflex, mild RPE mottling and clumping, No heme or edema   Vessels Mild Vascular attenuation, mild tortuousity, mild AV crossing changes Mild Vascular attenuation, mild Tortuousity, mild AV crossing changes   Periphery Attached, no RT/RD, VR tuft 900 ORA -- no tear or SRF -- stable from prior Attached        Refraction    Wearing Rx      Sphere Cylinder Axis Add   Right -0.50 +1.75 177 +2.50   Left [place] +1.00 054 +2.50   Type: PAL          IMAGING AND PROCEDURES  Imaging and Procedures for 01/12/18  OCT, Retina - OU - Both Eyes       Right Eye Quality was good. Central Foveal Thickness: 280. Progression has been stable. Findings include normal foveal contour, no IRF, no SRF.   Left Eye Quality was good. Central Foveal Thickness: 278. Progression has been stable. Findings include normal foveal contour, no IRF, no SRF.   Notes *Images captured and stored on drive  Diagnosis / Impression:  NFP, No IRF/SRF OU  Clinical management:  See below  Abbreviations: NFP - Normal foveal profile. CME - cystoid macular edema. PED - pigment epithelial detachment. IRF - intraretinal fluid. SRF -  subretinal fluid. EZ - ellipsoid zone. ERM - epiretinal membrane. ORA - outer retinal atrophy. ORT - outer retinal tubulation. SRHM - subretinal hyper-reflective material                  ASSESSMENT/PLAN:    ICD-10-CM   1. PVD (posterior vitreous detachment), bilateral  H43.813   2. Vitreous hemorrhage of right eye ([place])  H43.11   3. Cystic retinal tuft of right eye  H35.461   4. Retinal edema  H35.81 OCT, Retina - OU - Both Eyes  5. Essential hypertension  I10   6. Hypertensive retinopathy of both eyes  H35.033   7. Combined forms of age-related cataract of both eyes  H25.813      1-3. 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 Shirleen Schirmer and resumption of primary eye care  4. No retinal edema on exam or OCT  5,6. Hypertensive retinopathy OU  - discussed importance of tight BP control  - monitor  7. Combined form age related cataract OU-   - The symptoms of cataract, surgical options, and treatments and risks were discussed with patient.  - discussed diagnosis and progression  - under the expert care of Dr. Midge Aver   Ophthalmic Meds Ordered this visit:  No orders of the defined types were placed in this encounter.      Return if symptoms worsen or fail to improve.  There are no Patient Instructions on file for this visit.   Explained the diagnoses, plan, and follow up with the patient and they expressed understanding.  Patient expressed understanding of the importance of proper follow up care.    This document serves as a record of services personally performed by Gardiner Sleeper, MD, PhD. It was created on their behalf by Roselee Nova, COMT. The creation of this record is the provider's dictation and/or activities during the visit.  Electronically signed by: Roselee Nova, COMT 06/03/20 1:13 PM   This document serves as a record of services personally performed by Gardiner Sleeper, MD, PhD. It was created on their behalf by San Jetty. Owens Shark, OA an ophthalmic technician. The creation of this record is the provider's dictation and/or activities during the visit.    Electronically signed by: San Jetty. Owens Shark, [place] 09.15.2021 1:13 PM   Gardiner Sleeper, M.D.,  Ph.D. Diseases & Surgery of the Retina and Vitreous [facility]  I have reviewed the above documentation for accuracy and completeness, and I agree with the above. Gardiner Sleeper, M.D., Ph.D. 06/03/20 1:13 PM   Abbreviations: M myopia (nearsighted); A astigmatism; H hyperopia (farsighted); P presbyopia; Mrx spectacle prescription;  CTL contact lenses; OD right eye; OS left eye; OU both eyes  XT exotropia; ET esotropia; PEK punctate epithelial keratitis; PEE punctate epithelial erosions; DES dry eye syndrome; MGD meibomian gland dysfunction; ATs artificial tears; PFAT's preservative free artificial tears; [place] nuclear sclerotic cataract; PSC posterior subcapsular cataract; ERM epi-retinal membrane; PVD posterior vitreous detachment; RD retinal detachment; DM diabetes mellitus; DR diabetic retinopathy; NPDR  non-proliferative diabetic retinopathy; PDR proliferative diabetic retinopathy; CSME clinically significant macular edema; DME diabetic macular edema; dbh dot blot hemorrhages; CWS cotton wool spot; POAG primary open angle glaucoma; C/D cup-to-disc ratio; HVF humphrey visual field; GVF goldmann visual field; OCT optical coherence tomography; IOP intraocular pressure; BRVO Branch retinal vein occlusion; CRVO central retinal vein occlusion; CRAO central retinal artery occlusion; BRAO branch retinal artery occlusion; RT retinal tear; SB scleral buckle; PPV pars plana vitrectomy; VH Vitreous hemorrhage; PRP panretinal laser photocoagulation; IVK intravitreal kenalog; VMT vitreomacular traction; MH Macular hole;  NVD neovascularization of the disc; NVE neovascularization elsewhere; AREDS age related eye disease study; ARMD age related macular degeneration; POAG primary open angle glaucoma; EBMD epithelial/anterior basement membrane dystrophy; ACIOL anterior chamber intraocular lens; IOL intraocular lens; PCIOL posterior chamber intraocular lens; Phaco/IOL phacoemulsification with  intraocular lens placement; [place] photorefractive keratectomy; LASIK laser assisted in situ keratomileusis; HTN hypertension; DM diabetes mellitus; COPD chronic obstructive pulmonary disease

## 2020-06-03 ENCOUNTER — Other Ambulatory Visit: Payer: Self-pay

## 2020-06-03 ENCOUNTER — Encounter (INDEPENDENT_AMBULATORY_CARE_PROVIDER_SITE_OTHER): Payer: Self-pay | Admitting: Ophthalmology

## 2020-06-03 ENCOUNTER — Ambulatory Visit (INDEPENDENT_AMBULATORY_CARE_PROVIDER_SITE_OTHER): Payer: Medicare Other | Admitting: Ophthalmology

## 2020-06-03 DIAGNOSIS — H35033 Hypertensive retinopathy, bilateral: Secondary | ICD-10-CM

## 2020-06-03 DIAGNOSIS — I10 Essential (primary) hypertension: Secondary | ICD-10-CM

## 2020-06-03 DIAGNOSIS — H3581 Retinal edema: Secondary | ICD-10-CM | POA: Diagnosis not present

## 2020-06-03 DIAGNOSIS — H25813 Combined forms of age-related cataract, bilateral: Secondary | ICD-10-CM

## 2020-06-03 DIAGNOSIS — H43813 Vitreous degeneration, bilateral: Secondary | ICD-10-CM

## 2020-06-03 DIAGNOSIS — H35461 Secondary vitreoretinal degeneration, right eye: Secondary | ICD-10-CM

## 2020-06-03 DIAGNOSIS — H4311 Vitreous hemorrhage, right eye: Secondary | ICD-10-CM

## 2020-06-04 ENCOUNTER — Encounter: Payer: Self-pay | Admitting: Gastroenterology

## 2020-06-04 IMAGING — US US EXTREM UP*L* LTD
1 series · 11 of 11 positions shown · non-contrast
Comparison: None.

CLINICAL DATA: Prior traumatic amputation of the distal left ring
finger status post surgery. Growing tender mass at the tip of the
amputation stump.

EXAM:
ULTRASOUND LEFT UPPER EXTREMITY LIMITED
TECHNIQUE: Ultrasound examination of the upper extremity soft tissues was
performed in the area of clinical concern.

[Series 1: us extrem up*left* ltd · 0.02mm/px · 11 acquisitions, 11 frames shown]
[im 1/11]
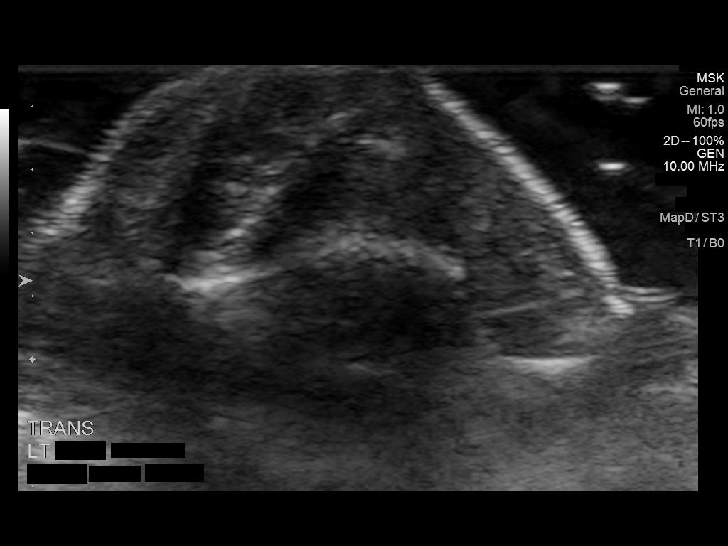
[im 2/11]
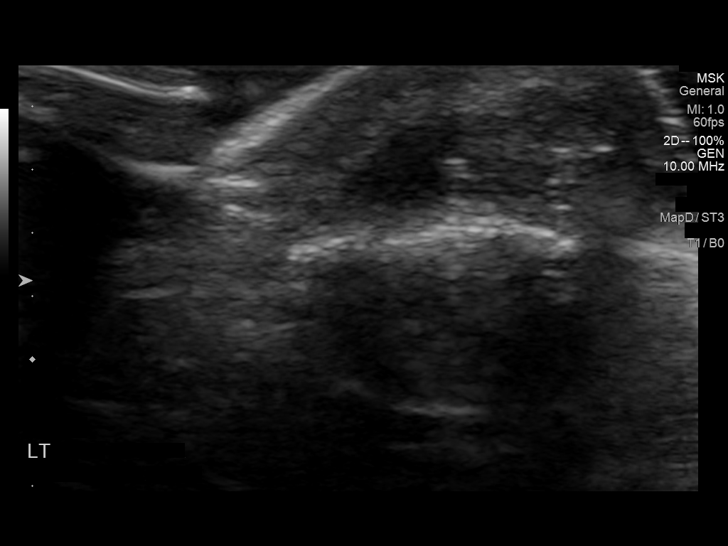
[im 3/11]
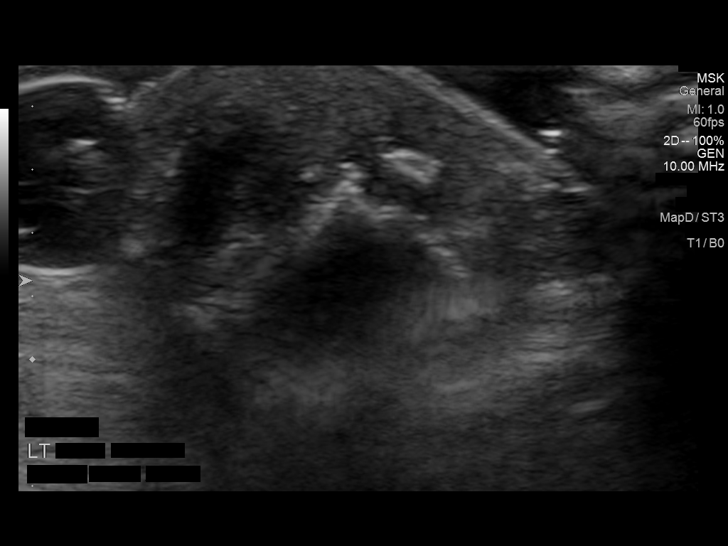
[im 4/11]
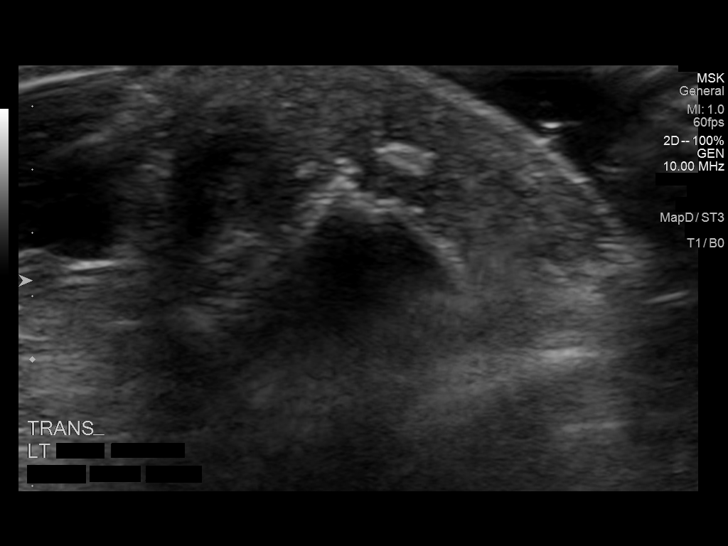
[im 5/11]
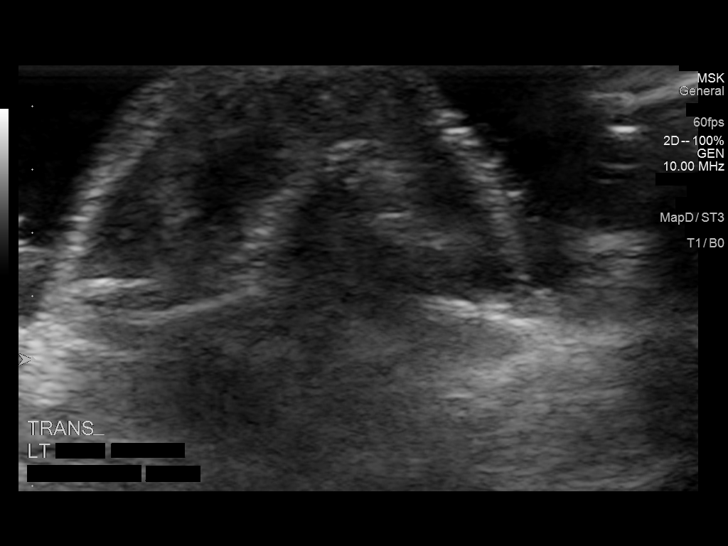
[im 6/11]
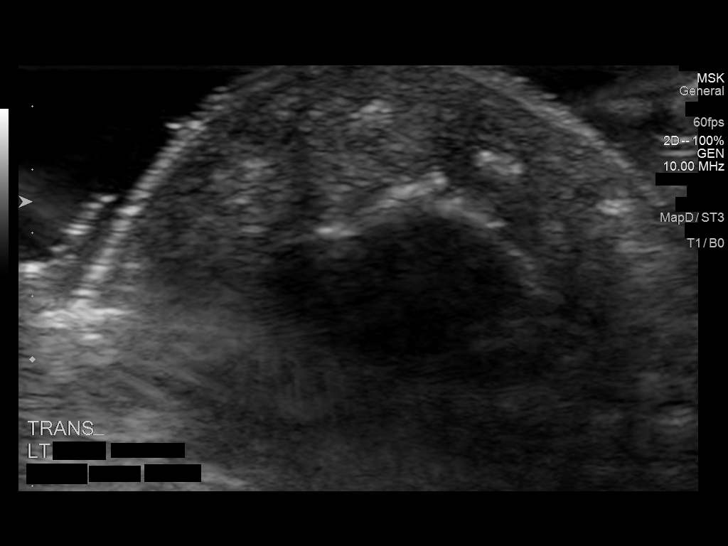
[im 7/11]
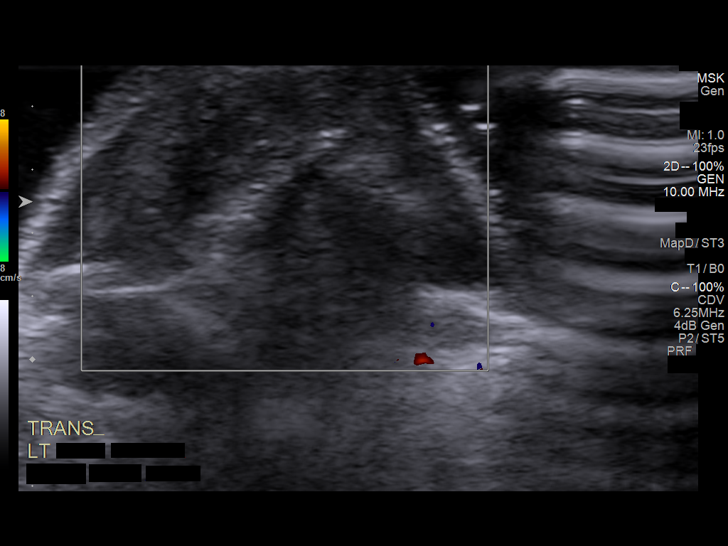
[im 8/11]
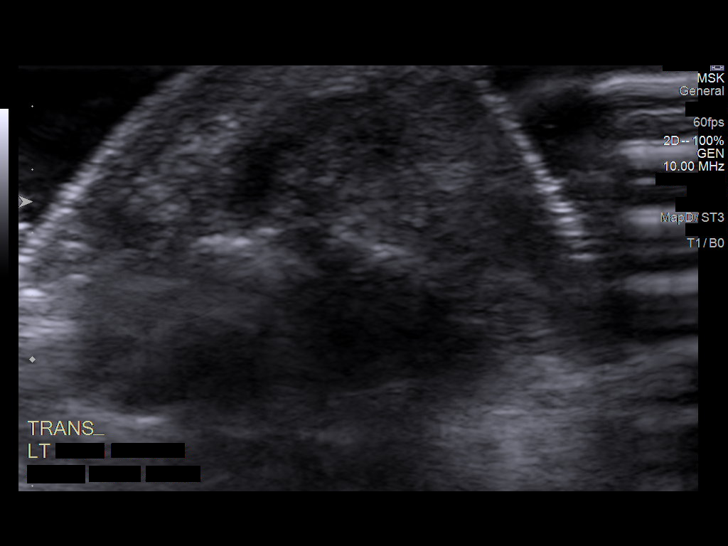
[im 9/11]
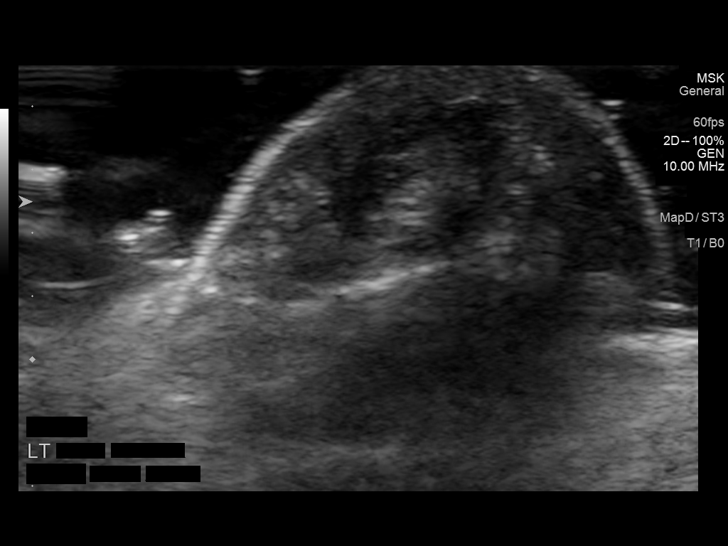
[im 10/11]
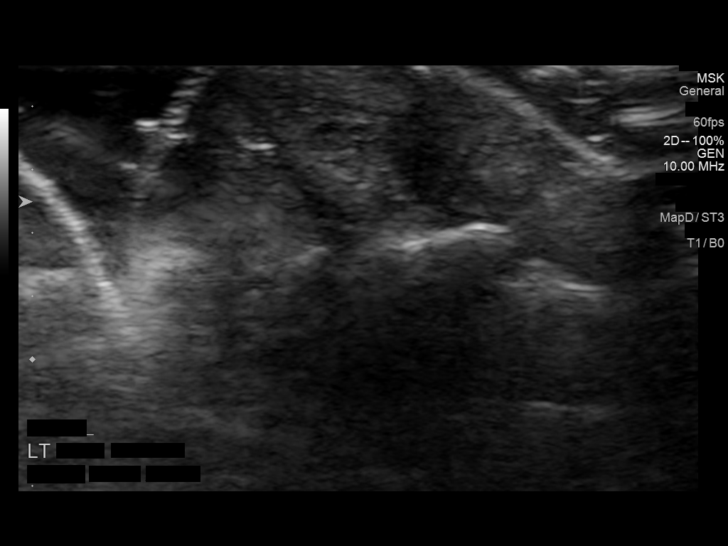
[im 11/11]
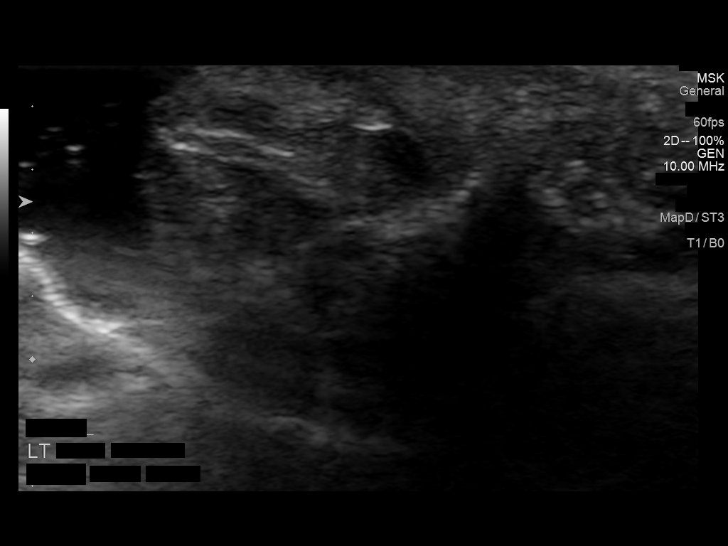

[11 of 11 positions shown; findings below may reference images not displayed]

FINDINGS: Focused ultrasound along the radial aspect of the left ring finger
amputation stump demonstrates an ill-defined heterogeneous area of
hypoechogenicity surrounding a thin linear 3 mm area of
hyperechogenicity. There is no discrete mass or fluid collection.
IMPRESSION: 1. Findings suspicious for recurrent nail horn of the left ring
finger amputation stump. No discrete mass.

## 2020-06-05 ENCOUNTER — Ambulatory Visit
Admission: RE | Admit: 2020-06-05 | Discharge: 2020-06-05 | Disposition: A | Discharge status: Home / Self Care | Payer: Medicare Other | Source: Ambulatory Visit | Attending: Family Medicine | Admitting: Family Medicine

## 2020-06-05 DIAGNOSIS — I2510 Atherosclerotic heart disease of native coronary artery without angina pectoris: Secondary | ICD-10-CM | POA: Diagnosis not present

## 2020-06-05 DIAGNOSIS — J984 Other disorders of lung: Secondary | ICD-10-CM | POA: Diagnosis not present

## 2020-06-05 DIAGNOSIS — I700 Atherosclerosis of aorta: Secondary | ICD-10-CM | POA: Diagnosis not present

## 2020-06-05 DIAGNOSIS — R911 Solitary pulmonary nodule: Secondary | ICD-10-CM

## 2020-06-05 DIAGNOSIS — R918 Other nonspecific abnormal finding of lung field: Secondary | ICD-10-CM

## 2020-06-05 DIAGNOSIS — J9811 Atelectasis: Secondary | ICD-10-CM | POA: Diagnosis not present

## 2020-06-05 MED ORDER — IOPAMIDOL (ISOVUE-300) INJECTION 61%
75.0000 mL | Freq: Once | INTRAVENOUS | Status: AC | PRN
  Administered 2020-06-05: 75 mL via INTRAVENOUS

## 2020-06-07 ENCOUNTER — Encounter: Payer: Self-pay | Admitting: Family Medicine

## 2020-06-08 ENCOUNTER — Encounter: Payer: Self-pay | Admitting: Family Medicine

## 2020-08-07 DIAGNOSIS — Z85828 Personal history of other malignant neoplasm of skin: Secondary | ICD-10-CM | POA: Diagnosis not present

## 2020-08-07 DIAGNOSIS — D485 Neoplasm of uncertain behavior of skin: Secondary | ICD-10-CM | POA: Diagnosis not present

## 2020-08-07 DIAGNOSIS — L570 Actinic keratosis: Secondary | ICD-10-CM | POA: Diagnosis not present

## 2020-08-07 DIAGNOSIS — D692 Other nonthrombocytopenic purpura: Secondary | ICD-10-CM | POA: Diagnosis not present

## 2020-08-07 DIAGNOSIS — D225 Melanocytic nevi of trunk: Secondary | ICD-10-CM | POA: Diagnosis not present

## 2020-08-07 DIAGNOSIS — L821 Other seborrheic keratosis: Secondary | ICD-10-CM | POA: Diagnosis not present

## 2020-08-07 DIAGNOSIS — C44729 Squamous cell carcinoma of skin of left lower limb, including hip: Secondary | ICD-10-CM | POA: Diagnosis not present

## 2020-08-19 DIAGNOSIS — C44729 Squamous cell carcinoma of skin of left lower limb, including hip: Secondary | ICD-10-CM | POA: Diagnosis not present

## 2020-09-04 ENCOUNTER — Encounter: Payer: Self-pay | Admitting: Cardiology

## 2020-09-04 ENCOUNTER — Ambulatory Visit (INDEPENDENT_AMBULATORY_CARE_PROVIDER_SITE_OTHER): Payer: Medicare Other | Admitting: Cardiology

## 2020-09-04 ENCOUNTER — Other Ambulatory Visit: Payer: Self-pay

## 2020-09-04 VITALS — BP 100/64 | HR 69 | Ht 72.0 in | Wt 221.0 lb

## 2020-09-04 DIAGNOSIS — R55 Syncope and collapse: Secondary | ICD-10-CM

## 2020-09-04 DIAGNOSIS — I10 Essential (primary) hypertension: Secondary | ICD-10-CM

## 2020-09-04 NOTE — Progress Notes (Signed)
Cardiology Office Note:    Date:  09/04/2020   ID:  Chris Welch, DOB 1946-03-03, MRN [id number]  PCP:  Chris Mclean, MD  [facility] Cardiologist:  Chris Furbish, MD  [facility] Electrophysiologist:  None   Referring MD: Chris Mclean, MD     History of Present Illness:    Chris Welch is a 74 y.o. male here for the follow-up of prior labile hypertension, syncope. Previously seen by Chris Welch. Overall doing quite well. 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 Has a tricycle motorcycle.  He was in the emergency department with blood pressure 75/56.  Was given fluids and appropriately responded.  He was standing in the bathroom in his RV when he felt hot and broke out in a sweat and lost consciousness. Vagal type reaction.  He has not had any further syncopal episodes since then.  Past Medical History:  Diagnosis Date  . Adenomatous colon polyp 2009  . Anxiety   . Arthritis    "hips, knees" (01/27/2016)  . Complication of anesthesia    bad dreams  . Depression   . Diverticulitis   . Diverticulosis   . Ear infection ear infection lasting past 2 months, still ongoing, pt on antibiotics & drops. pt states "ear infection has eaten holes through bilateral ear drums,  I am hard of hearing"  . Enlarged prostate   . Esophageal stricture   . Gastropathy    reactive  . GERD (gastroesophageal reflux disease)   . Hard of hearing   . Hyperlipidemia   . Hypertension   . 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 Pneumonia    x 3, last 2009 (01/27/2016)  . 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 Vocal cord paralysis    Right    Past Surgical History:  Procedure Laterality Date  . APPENDECTOMY    . BACK SURGERY    . CAROTID BODY TUMOR EXCISION    . CERVICAL PARAGANGLIOMA EXCISION Right 12/2006  . CHOLECYSTECTOMY OPEN    . CYST EXCISION Left 12/17/2019   Procedure: CYST  EXCISION; EXCISION NAIL HORN REMNANT LEFT RING FINGER;  Surgeon: Chris Brod, MD;  Location: [place];  Service: Orthopedics;  Laterality: Left;  IV REGIONAL  . DEEP NECK LYMPH NODE BIOPSY / EXCISION    . DIGIT NAIL REMOVAL Left 07/30/2019   Procedure: REMOVAL OF NAIL HORNS LEFT RING FINGER;  Surgeon: Chris Brod, MD;  Location: [place];  Service: Orthopedics;  Laterality: Left;  IV REGIONAL FOREARM BLOCK  . ESOPHAGOGASTRODUODENOSCOPY (EGD) WITH ESOPHAGEAL DILATION    . INGUINAL HERNIA REPAIR Right 1954  . JOINT REPLACEMENT    . KNEE ARTHROSCOPY Right   . LUMBAR LAMINECTOMY/DECOMPRESSION MICRODISCECTOMY Left 03/09/2013   Procedure: LUMBAR LAMINECTOMY/DECOMPRESSION MICRODISCECTOMY 1 LEVEL;  Surgeon: Chris Moore, MD;  Location: [place] [facility];  Service: Neurosurgery;  Laterality: Left;  Left lumbar three-four extra-foraminal microdiscectomy  . PATELLA ARTHROPLASTY Right    has had 5 surgeries right knee  . TOTAL HIP ARTHROPLASTY Left 01/27/2016  . TOTAL HIP ARTHROPLASTY Left 01/27/2016   Procedure: TOTAL HIP ARTHROPLASTY ANTERIOR APPROACH;  Surgeon: Chris Pear, MD;  Location: [place];  Service: Orthopedics;  Laterality: Left;  . UPPER GASTROINTESTINAL ENDOSCOPY    . VOCAL CORD IMPLANT      Current Medications: Current Meds  Medication Sig  . albuterol (PROVENTIL HFA;VENTOLIN HFA) 108 (  90 Base) MCG/ACT inhaler Inhale 2 puffs into the lungs every 6 (six) hours as needed for wheezing or shortness of breath.  [name] Kitchen aspirin EC 81 MG tablet Take 81 mg by mouth daily.  . bisacodyl (DULCOLAX) 5 MG EC tablet Take 5 mg by mouth in the morning and at bedtime.  [name] Kitchen buPROPion (WELLBUTRIN XL) 150 MG 24 hr tablet Take 3 tablets (450 mg total) by mouth daily.  . finasteride (PROSCAR) 5 MG tablet Take 1 tablet (5 mg total) by mouth daily.  . fluticasone (FLONASE) 50 MCG/ACT nasal spray Place 2 sprays into both nostrils daily.  [name] Kitchen glycopyrrolate (ROBINUL) 1 MG tablet Take 1 tablet (1  mg total) by mouth 3 (three) times daily.  [name] Kitchen losartan (COZAAR) 50 MG tablet TAKE 1 TABLET BY MOUTH ONCE DAILY  . omeprazole (PRILOSEC) 40 MG capsule TAKE 1 CAPSULE BY MOUTH 2 TIMES DAILY.  . pravastatin (PRAVACHOL) 20 MG tablet Take 1 tablet (20 mg total) by mouth daily.  . prazosin (MINIPRESS) 2 MG capsule Take 2 mg by mouth at bedtime.  . tamsulosin (FLOMAX) 0.4 MG CAPS capsule Take by mouth.  . traMADol (ULTRAM) 50 MG tablet Take 1 tablet (50 mg total) by mouth every 6 (six) hours as needed.  . traZODone (DESYREL) 50 MG tablet TAKE 1 TABLET BY MOUTH AT BEDTIME  . VIAGRA 100 MG tablet TAKE 1/2-1 TABLET BY MOUTH DAILY AS NEEDED FOR ERECTILE DYSFUNCTION     Allergies:   Hydrocodone and Penicillins   Social History   Socioeconomic History  . Marital status: Married    Spouse name: Chris Welch  . Number of children: 4  . Years of education: 76  . Highest education level: Not on file  Occupational History  . Occupation: Machinist-retired 2015    Employer: [employer]    Comment: [employer]  . 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 Smoking status: Never Smoker  . Smokeless tobacco: Never Used  Vaping Use  . Vaping Use: Never used  Substance and Sexual Activity  . Alcohol use: Yes    Alcohol/week: 3.0 standard drinks    Types: 3 Cans of beer per week    Comment: social 2 per week  . Drug use: No  . Sexual activity: Yes    Partners: Female  Other Topics Concern  . Not on file  Social History Narrative   Lives with his wife.  Three children are grown.  Younger daughter will graduate from [employer] in 01/2013.  Both sons and oldest daughter are in [place]. Exercises regularly.   His wife retired 12/2015 and is enjoying spending time in the yard, and with their family.   Social Determinants of Health   Financial Resource Strain: Low Risk   . Difficulty of Paying Living Expenses: Not hard at all  Food Insecurity: No Food Insecurity  . Worried About Paediatric nurse in the Last Year: Never true  . Ran Out of Food in the Last Year: Never true  Transportation Needs: No Transportation Needs  . Lack of Transportation (Medical): No  . Lack of Transportation (Non-Medical): No  Physical Activity: Not on file  Stress: Not on file  Social Connections: Not on file     Family History: The patient's family history includes Arthritis in his brother; Cancer in his brother; Emphysema in his brother and father; Heart disease in his mother. There is no history of Colon cancer, Esophageal cancer, Rectal cancer, or Stomach cancer.  ROS:   Please see the history of present illness.     All other systems reviewed and are negative.  EKGs/Labs/Other Studies Reviewed:    Recent Labs: 04/01/2020: ALT 20; BUN 23; Creatinine, Ser 1.28; Hemoglobin 12.9; Platelets 154.0; Potassium 4.4; Sodium 138  Recent Lipid Panel    Component Value Date/Time   CHOL 158 05/29/2019 0911   TRIG 79.0 05/29/2019 0911   HDL 38.20 (L) 05/29/2019 0911   CHOLHDL 4 05/29/2019 0911   VLDL 15.8 05/29/2019 0911   LDLCALC 104 (H) 05/29/2019 0911       Physical Exam:    VS:  BP 100/64 (BP Location: Left Arm, Patient Position: Sitting, Cuff Size: Normal)   Pulse 69   Ht 6' (1.829 m)   Wt 221 lb (100.2 kg)   SpO2 95%   BMI 29.97 kg/m     Wt Readings from Last 3 Encounters:  09/04/20 221 lb (100.2 kg)  05/20/20 223 lb (101.2 kg)  04/01/20 223 lb 2 oz (101.2 kg)     GEN:  Well nourished, well developed in no acute distress HEENT: Normal NECK: No JVD; No carotid bruits LYMPHATICS: No lymphadenopathy CARDIAC: RRR, no murmurs, rubs, gallops RESPIRATORY:  Clear to auscultation without rales, wheezing or rhonchi  ABDOMEN: Soft, non-tender, non-distended MUSCULOSKELETAL:  No edema; No deformity  SKIN: Warm and dry NEUROLOGIC:  Alert and oriented x 3 PSYCHIATRIC:  Normal affect   ASSESSMENT:    1. Essential hypertension   2. Vasovagal syncope    PLAN:    In order of  problems listed above:  Labile blood pressures -Today 100/64, sometimes markedly elevated.  We will go ahead and continue with current blood pressure regimen which includes losartan 50 mg a day.  He is also on terazosin 2 mg at bedtime as well as Flomax.  Stay hydrated.  Vasovagal syncope -ER visit in [place] in July 2021 reviewed.  Lab work unremarkable.  Scans unremarkable.  EKG unremarkable.  Felt hot, sweaty.  Fainted.  Continue with hydration.  We discussed how his praises and another antihypertensive can sometimes potentiate this.  If he starts to feel the prodrome he knows to lay down.  Hearing loss -Currently wearing hearing aids.     Medication Adjustments/Labs and Tests Ordered: Current medicines are reviewed at length with the patient today.  Concerns regarding medicines are outlined above.  Orders Placed This Encounter  Procedures  . EKG 12-Lead   No orders of the defined types were placed in this encounter.   Patient Instructions  Medication Instructions:  The current medical regimen is effective;  continue present plan and medications.  *If you need a refill on your cardiac medications before your next appointment, please call your pharmacy*  Follow-Up: At [facility], you and your health needs are our priority.  As part of our continuing mission to provide you with exceptional heart care, we have created designated Provider Care Teams.  These Care Teams include your primary Cardiologist (physician) and Advanced Practice Providers (APPs -  Physician Assistants and Nurse Practitioners) who all work together to provide you with the care you need, when you need it.  We recommend signing up for the patient portal called "MyChart".  Sign up information is provided on this After Visit Summary.  MyChart is used to connect with patients for Virtual Visits (Telemedicine).  Patients are able to view lab/test results, encounter notes, upcoming appointments, etc.  Non-urgent  messages can be sent to your provider as well.  To learn more about what you can do with MyChart, go to [email or web address].    Your next appointment:   12 month(s)  The format for your next appointment:   In Person  Provider:   Candee Furbish, MD   Thank you for choosing [facility]!!        Signed, Chris Furbish, MD  09/04/2020 12:46 PM    [place]

## 2020-09-04 NOTE — Patient Instructions (Signed)
Medication Instructions:  The current medical regimen is effective;  continue present plan and medications.  *If you need a refill on your cardiac medications before your next appointment, please call your pharmacy*  Follow-Up: At [facility], you and your health needs are our priority.  As part of our continuing mission to provide you with exceptional heart care, we have created designated Provider Care Teams.  These Care Teams include your primary Cardiologist (physician) and Advanced Practice Providers (APPs -  Physician Assistants and Nurse Practitioners) who all work together to provide you with the care you need, when you need it.  We recommend signing up for the patient portal called "MyChart".  Sign up information is provided on this After Visit Summary.  MyChart is used to connect with patients for Virtual Visits (Telemedicine).  Patients are able to view lab/test results, encounter notes, upcoming appointments, etc.  Non-urgent messages can be sent to your provider as well.   To learn more about what you can do with MyChart, go to [email or web address].    Your next appointment:   12 month(s)  The format for your next appointment:   In Person  Provider:   Mark Skains, MD   Thank you for choosing [facility]!!      

## 2020-09-16 DIAGNOSIS — H02831 Dermatochalasis of right upper eyelid: Secondary | ICD-10-CM | POA: Diagnosis not present

## 2020-09-16 DIAGNOSIS — M19031 Primary osteoarthritis, right wrist: Secondary | ICD-10-CM | POA: Diagnosis not present

## 2020-09-16 DIAGNOSIS — H2513 Age-related nuclear cataract, bilateral: Secondary | ICD-10-CM | POA: Diagnosis not present

## 2020-09-16 DIAGNOSIS — H02834 Dermatochalasis of left upper eyelid: Secondary | ICD-10-CM | POA: Diagnosis not present

## 2020-09-16 DIAGNOSIS — H43813 Vitreous degeneration, bilateral: Secondary | ICD-10-CM | POA: Diagnosis not present

## 2020-09-25 ENCOUNTER — Ambulatory Visit: Payer: Medicare Other | Attending: Internal Medicine

## 2020-09-25 ENCOUNTER — Other Ambulatory Visit (HOSPITAL_BASED_OUTPATIENT_CLINIC_OR_DEPARTMENT_OTHER): Payer: Self-pay | Admitting: Internal Medicine

## 2020-09-25 DIAGNOSIS — Z23 Encounter for immunization: Secondary | ICD-10-CM

## 2020-09-25 NOTE — Progress Notes (Signed)
   Covid-19 Vaccination Clinic  Name:  ADDIEL MCCARDLE    MRN: [id number] DOB: [birth year]  09/25/2020  Mr. Sauerwein was observed post Covid-19 immunization for 15 minutes without incident. He was provided with Vaccine Information Sheet and instruction to access the V-Safe system.   Mr. Gaffin was instructed to call 911 with any severe reactions post vaccine: [name] Kitchen Difficulty breathing  . Swelling of face and throat  . A fast heartbeat  . A bad rash all over body  . Dizziness and weakness   Immunizations Administered    Name Date Dose VIS Date Route   Pfizer COVID-19 Vaccine 09/25/2020 10:04 AM 0.3 mL 07/08/2020 Intramuscular   Manufacturer: [place]   Lot: [id number]   [place]: [id number]

## 2020-09-28 MED FILL — PFIZER-BIONTECH COVID-19 VA: 30 | 21 days supply | Qty: 0 | Fill #0

## 2020-09-30 ENCOUNTER — Encounter: Payer: Self-pay | Admitting: Urology

## 2020-09-30 ENCOUNTER — Ambulatory Visit (INDEPENDENT_AMBULATORY_CARE_PROVIDER_SITE_OTHER): Payer: Medicare Other | Admitting: Urology

## 2020-09-30 ENCOUNTER — Other Ambulatory Visit: Payer: Self-pay

## 2020-09-30 VITALS — BP 151/97 | HR 108 | Ht 74.0 in | Wt 218.0 lb

## 2020-09-30 DIAGNOSIS — R351 Nocturia: Secondary | ICD-10-CM | POA: Diagnosis not present

## 2020-09-30 DIAGNOSIS — N401 Enlarged prostate with lower urinary tract symptoms: Secondary | ICD-10-CM

## 2020-09-30 LAB — BLADDER SCAN AMB NON-IMAGING: Scan Result: 5

## 2020-09-30 NOTE — Progress Notes (Signed)
09/30/2020 2:59 PM   Chris Welch 1945/11/05 [id number]  Referring provider: Darreld Mclean, MD [address] [place],  [place] [address]  Chief Complaint  Patient presents with  . Urinary Frequency    HPI: 75 year old male who presents today for consideration of UroLift.  He had longstanding history of BPH with mixed obstructive and irritative urinary symptoms. IPSS is below. He reports that he occasionally has weak stream, difficulty starting his stream, feels that he does not empty completely along with some daytime frequency and nocturia x3. All of this is fairly bothersome to him.  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 He is currently on finasteride and has been taking this for many years. He feels like it is no longer effective.  No history of UTIs, dysuria or frank urinary retention. He had 1 occasion where he was concerned he was not going to be able to urinate but ultimately did.  No recent PSAs or prostate screening.  He has never seen a urologist in the past.  He is particularly interested in [place]. He had a golfing buddy who had the procedure done and is doing extremely well on this. He has been doing some research and has watched videos online.   IPSS    Row Name 09/30/20 1400         International Prostate Symptom Score   How often have you had the sensation of not emptying your bladder? About half the time     How often have you had to urinate less than every two hours? Almost always     How often have you found you stopped and started again several times when you urinated? About half the time     How often have you found it difficult to postpone urination? Less than half the time     How often have you had a weak urinary stream? More than half the time     How often have you had to strain to start urination? More than  half the time     How many times did you typically get up at night to urinate? 3 Times     Total IPSS Score 24           Quality of Life due to urinary symptoms   If you were to spend the rest of your life with your urinary condition just the way it is now how would you feel about that? Unhappy            Score:  1-7 Mild 8-19 Moderate 20-35 Severe    PMH: Past Medical History:  Diagnosis Date  . Adenomatous colon polyp 2009  . Anxiety   . Arthritis    "hips, knees" (01/27/2016)  . Complication of anesthesia    bad dreams  . Depression   . Diverticulitis   . Diverticulosis   . Ear infection ear infection lasting past 2 months, still ongoing, pt on antibiotics & drops. pt states "ear infection has eaten holes through bilateral ear drums,  I am hard of hearing"  . Enlarged prostate   . Esophageal stricture   . Gastropathy    reactive  . GERD (gastroesophageal reflux disease)   . Hard of hearing   . Hyperlipidemia   . Hypertension   . 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 Pneumonia    x 3, last 2009 (01/27/2016)  . 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 Vocal cord paralysis    Right    Surgical History: Past Surgical History:  Procedure Laterality Date  . APPENDECTOMY    . BACK SURGERY    . CAROTID BODY TUMOR EXCISION    . CERVICAL PARAGANGLIOMA EXCISION Right 12/2006  . CHOLECYSTECTOMY OPEN    . CYST EXCISION Left 12/17/2019   Procedure: CYST EXCISION; EXCISION NAIL HORN REMNANT LEFT RING FINGER;  Surgeon: Daryll Brod, MD;  Location: [place];  Service: Orthopedics;  Laterality: Left;  IV REGIONAL  . DEEP NECK LYMPH NODE BIOPSY / EXCISION    . DIGIT NAIL REMOVAL Left 07/30/2019   Procedure: REMOVAL OF NAIL HORNS LEFT RING FINGER;  Surgeon: Daryll Brod, MD;  Location: [place];  Service: Orthopedics;  Laterality: Left;  IV REGIONAL FOREARM BLOCK  . ESOPHAGOGASTRODUODENOSCOPY (EGD) WITH  ESOPHAGEAL DILATION    . INGUINAL HERNIA REPAIR Right 1954  . JOINT REPLACEMENT    . KNEE ARTHROSCOPY Right   . LUMBAR LAMINECTOMY/DECOMPRESSION MICRODISCECTOMY Left 03/09/2013   Procedure: LUMBAR LAMINECTOMY/DECOMPRESSION MICRODISCECTOMY 1 LEVEL;  Surgeon: Eustace Moore, MD;  Location: [place] [facility];  Service: Neurosurgery;  Laterality: Left;  Left lumbar three-four extra-foraminal microdiscectomy  . PATELLA ARTHROPLASTY Right    has had 5 surgeries right knee  . TOTAL HIP ARTHROPLASTY Left 01/27/2016  . TOTAL HIP ARTHROPLASTY Left 01/27/2016   Procedure: TOTAL HIP ARTHROPLASTY ANTERIOR APPROACH;  Surgeon: Frederik Pear, MD;  Location: [place];  Service: Orthopedics;  Laterality: Left;  . UPPER GASTROINTESTINAL ENDOSCOPY    . VOCAL CORD IMPLANT      Home Medications:  Allergies as of 09/30/2020      Reactions   Hydrocodone Nausea And Vomiting   "can not take on an empty stomach"   Penicillins Other (See Comments), Rash   Ulcers on eyes      Medication List       Accurate as of September 30, 2020  2:59 PM. If you have any questions, ask your nurse or doctor.        STOP taking these medications   tamsulosin 0.4 MG Caps capsule Commonly known as: FLOMAX Stopped by: Hollice Espy, MD   traMADol 50 MG tablet Commonly known as: Ultram Stopped by: Hollice Espy, MD   Viagra 100 MG tablet Generic drug: sildenafil Stopped by: Hollice Espy, MD     TAKE these medications   albuterol 108 (90 Base) MCG/ACT inhaler Commonly known as: VENTOLIN HFA Inhale 2 puffs into the lungs every 6 (six) hours as needed for wheezing or shortness of breath.   aspirin EC 81 MG tablet Take 81 mg by mouth daily.   bisacodyl 5 MG EC tablet Commonly known as: DULCOLAX Take 5 mg by mouth in the morning and at bedtime.   buPROPion 150 MG 24 hr tablet Commonly known as: WELLBUTRIN XL Take 3 tablets (450 mg total) by mouth daily.   finasteride 5 MG tablet Commonly known as: Proscar Take 1 tablet  (5 mg total) by mouth daily.   fluticasone 50 MCG/ACT nasal spray Commonly known as: FLONASE Place 2 sprays into both nostrils daily.   glycopyrrolate 1 MG tablet Commonly known as: ROBINUL Take 1 tablet (1 mg total) by mouth 3 (three) times daily.   losartan 50 MG tablet Commonly known as: COZAAR TAKE 1 TABLET BY MOUTH  ONCE DAILY   omeprazole 40 MG capsule Commonly known as: PRILOSEC TAKE 1 CAPSULE BY MOUTH 2 TIMES DAILY.   pravastatin 20 MG tablet Commonly known as: PRAVACHOL Take 1 tablet (20 mg total) by mouth daily.   prazosin 2 MG capsule Commonly known as: MINIPRESS Take 2 mg by mouth at bedtime.   traZODone 50 MG tablet Commonly known as: DESYREL TAKE 1 TABLET BY MOUTH AT BEDTIME       Allergies:  Allergies  Allergen Reactions  . Hydrocodone Nausea And Vomiting    "can not take on an empty stomach"  . Penicillins Other (See Comments) and Rash    Ulcers on eyes    Family History: Family History  Problem Relation Age of Onset  . Arthritis Brother        back  . Emphysema Brother   . Cancer Brother        rare cancer-unknown type  . Heart disease Mother   . Emphysema Father        smoker  . Colon cancer Neg Hx   . Esophageal cancer Neg Hx   . Rectal cancer Neg Hx   . Stomach cancer Neg Hx     Social History:  reports that he has never smoked. He has never used smokeless tobacco. He reports current alcohol use of about 3.0 standard drinks of alcohol per week. He reports that he does not use drugs.   Physical Exam: BP (!) 151/97   Pulse (!) 108   Ht 6\' 2"  (1.88 m)   Wt 218 lb (98.9 kg)   BMI 27.99 kg/m   Constitutional:  Alert and oriented, No acute distress. HEENT: [place] AT, moist mucus membranes.  Trachea midline, no masses. Cardiovascular: No clubbing, cyanosis, or edema. Respiratory: Normal respiratory effort, no increased work of breathing. Skin: No rashes, bruises or suspicious lesions. Neurologic: Grossly intact, no focal deficits,  moving all 4 extremities. Psychiatric: Normal mood and affect.  Laboratory Data: Lab Results  Component Value Date   WBC 6.7 04/01/2020   HGB 12.9 (L) 04/01/2020   HCT 37.6 (L) 04/01/2020   MCV 88.7 04/01/2020   PLT 154.0 04/01/2020    Lab Results  Component Value Date   CREATININE 1.28 04/01/2020    Lab Results  Component Value Date   PSA 1.31 05/29/2019   PSA 0.87 05/24/2018   PSA 1.39 03/01/2017   Urinalysis UA negative, see epic  Pertinent Imaging: Results for orders placed or performed in visit on 09/30/20  Bladder Scan (Post Void Residual) in office  Result Value Ref Range   Scan Result 5     Assessment & Plan:    1. BPH associated with nocturia Longstanding history of mixed urinary symptoms poorly controlled on finasteride only, previously failed Flomax  Interested in consideration of outlet procedure, particularly UroLift  I explained that in order to be a candidate for the procedure, cystoscopy and transrectal ultrasound is necessary for prostate sizing and anatomic evaluation. He is agreeable this plan.  We also discussed updating his PSA screening today. PSA today we will do a rectal exam at the time of TRUS. He is agreeable this plan.  All questions answered. - Urinalysis, Complete - Bladder Scan (Post Void Residual) in office - PSA; Future - PSA   Hollice Espy, MD  [facility] [address], [place], [place] [address] [phone]

## 2020-09-30 NOTE — Patient Instructions (Addendum)
Cystoscopy Cystoscopy is a procedure that is used to help diagnose and sometimes treat conditions that affect the lower urinary tract. The lower urinary tract includes the bladder and the urethra. The urethra is the tube that drains urine from the bladder. Cystoscopy is done using a thin, tube-shaped instrument with a light and camera at the end (cystoscope). The cystoscope may be hard or flexible, depending on the goal of the procedure. The cystoscope is inserted through the urethra, into the bladder. Cystoscopy may be recommended if you have:  Urinary tract infections that keep coming back.  Blood in the urine (hematuria).  An inability to control when you urinate (urinary incontinence) or an overactive bladder.  Unusual cells found in a urine sample.  A blockage in the urethra, such as a urinary stone.  Painful urination.  An abnormality in the bladder found during an intravenous pyelogram (IVP) or CT scan. Cystoscopy may also be done to remove a sample of tissue to be examined under a microscope (biopsy). What are the risks? Generally, this is a safe procedure. However, problems may occur, including:  Infection.  Bleeding.  What happens during the procedure?  1. You will be given one or more of the following: ? A medicine to numb the area (local anesthetic). 2. The area around the opening of your urethra will be cleaned. 3. The cystoscope will be passed through your urethra into your bladder. 4. Germ-free (sterile) fluid will flow through the cystoscope to fill your bladder. The fluid will stretch your bladder so that your health care provider can clearly examine your bladder walls. 5. Your doctor will look at the urethra and bladder. 6. The cystoscope will be removed The procedure may vary among health care providers  What can I expect after the procedure? After the procedure, it is common to have: 1. Some soreness or pain in your abdomen and urethra. 2. Urinary symptoms.  These include: ? Mild pain or burning when you urinate. Pain should stop within a few minutes after you urinate. This may last for up to 1 week. ? A small amount of blood in your urine for several days. ? Feeling like you need to urinate but producing only a small amount of urine. Follow these instructions at home: General instructions  Return to your normal activities as told by your health care provider.   Do not drive for 24 hours if you were given a sedative during your procedure.  Watch for any blood in your urine. If the amount of blood in your urine increases, call your health care provider.  If a tissue sample was removed for testing (biopsy) during your procedure, it is up to you to get your test results. Ask your health care provider, or the department that is doing the test, when your results will be ready.  Drink enough fluid to keep your urine pale yellow.  Keep all follow-up visits as told by your health care provider. This is important. Contact a health care provider if you:  Have pain that gets worse or does not get better with medicine, especially pain when you urinate.  Have trouble urinating.  Have more blood in your urine. Get help right away if you:  Have blood clots in your urine.  Have abdominal pain.  Have a fever or chills.  Are unable to urinate. Summary  Cystoscopy is a procedure that is used to help diagnose and sometimes treat conditions that affect the lower urinary tract.  Cystoscopy is done using   a thin, tube-shaped instrument with a light and camera at the end.  After the procedure, it is common to have some soreness or pain in your abdomen and urethra.  Watch for any blood in your urine. If the amount of blood in your urine increases, call your health care provider.  If you were prescribed an antibiotic medicine, take it as told by your health care provider. Do not stop taking the antibiotic even if you start to feel better. This  information is not intended to replace advice given to you by your health care provider. Make sure you discuss any questions you have with your health care provider. Document Revised: 08/28/2018 Document Reviewed: 08/28/2018 Elsevier Patient Education  2020 Elsevier In    Transrectal Ultrasound A transrectal ultrasound is a procedure that uses sound waves to create images of the prostate gland and nearby tissues. For this procedure, an ultrasound probe is placed in the rectum. The probe sends sound waves through the wall of the rectum into the prostate gland, which is located in front of the rectum. The images show the size and shape of the prostate gland and nearby structures. You may need this test if you have:  Trouble urinating.  Trouble getting your partner pregnant (infertility).  An abnormal result from a prostate screening exam. Tell a health care provider about:  Any allergies you have.  All medicines you are taking, including vitamins, herbs, eye drops, creams, and over-the-counter medicines.  Any blood disorders you have.  Any medical conditions you have.  Any surgeries you have had. What are the risks? Generally, this is a safe procedure. However, problems may occur, including:  Discomfort during the procedure. This is rare.  Blood in your urine or sperm after the procedure. This may occur if a sample of tissue (biopsy) is taken during the procedure. What happens before the procedure?  Your health care provider may instruct you to use an enema 1-4 hours before the procedure. Follow instructions from your health care provider about how to do the enema.  Ask your health care provider about: ? Changing or stopping your regular medicines. This is especially important if you are taking diabetes medicines or blood thinners. ? Taking medicines such as aspirin and ibuprofen. These medicines can thin your blood. Do not take these medicines unless your health care provider  tells you to take them. ? Taking over-the-counter medicines, vitamins, herbs, and supplements. What happens during the procedure?  You will be asked to lie down on your left side on an exam table.  You will bend your knees toward your chest.  Gel will be put on a probe, and the probe will be gently inserted into your rectum. This may cause a feeling of fullness.  The probe will send signals to a computer that will create images. These will be displayed on a monitor that looks like a small television screen.  The technician will slightly rotate the probe throughout the procedure. While rotating the probe, he or she will view and capture images of the prostate gland and the surrounding structures from different angles.  Your health care provider may take a biopsy sample of prostate tissue during the procedure. The images captured from the ultrasound will help guide the needle used to remove a sample of tissue. The sample will be sent to a lab for testing.  The probe will be removed. The procedure may vary among health care providers and hospitals. What can I expect after the procedure?  It is up to you to get the results of your procedure. Ask your health care provider, or the department that is doing the procedure, when your results will be ready.  Keep all follow-up visits as told by your health care provider. This is important. Summary  A transrectal ultrasound is a procedure that uses sound waves to create images of the prostate gland and nearby tissues.  The images show the size and shape of the prostate gland and nearby structures.  Before the procedure, ask your health care provider about changing or stopping your regular medicines. This is especially important if you are taking diabetes medicines or blood thinners. This information is not intended to replace advice given to you by your health care provider. Make sure you discuss any questions you have with your health care  provider. Document Revised: 10/30/2019 Document Reviewed: 10/30/2019 Elsevier Patient Education  2021 [employer].

## 2020-10-01 LAB — MICROSCOPIC EXAMINATION
Bacteria, UA: NONE SEEN
RBC, Urine: NONE SEEN /hpf (ref 0–2)

## 2020-10-01 LAB — URINALYSIS, COMPLETE
Bilirubin, UA: NEGATIVE
Glucose, UA: NEGATIVE
Ketones, UA: NEGATIVE
Leukocytes,UA: NEGATIVE
Nitrite, UA: NEGATIVE
Protein,UA: NEGATIVE
RBC, UA: NEGATIVE
Specific Gravity, UA: 1.015 (ref 1.005–1.030)
Urobilinogen, Ur: 1 mg/dL (ref 0.2–1.0)
pH, UA: 7 (ref 5.0–7.5)

## 2020-10-01 LAB — PSA: Prostate Specific Ag, Serum: 0.8 ng/mL (ref 0.0–4.0)

## 2020-10-02 ENCOUNTER — Telehealth: Payer: Self-pay | Admitting: *Deleted

## 2020-10-02 NOTE — Telephone Encounter (Addendum)
Patient informed, voiced understanding.    ----- Message from Hollice Espy, MD sent at 10/02/2020 11:09 AM EST ----- PSA is excellent

## 2020-10-06 ENCOUNTER — Other Ambulatory Visit: Payer: Self-pay | Admitting: Radiology

## 2020-10-06 ENCOUNTER — Ambulatory Visit (INDEPENDENT_AMBULATORY_CARE_PROVIDER_SITE_OTHER): Payer: Medicare Other | Admitting: Urology

## 2020-10-06 ENCOUNTER — Other Ambulatory Visit: Payer: Self-pay

## 2020-10-06 VITALS — BP 163/103 | HR 80 | Wt 218.0 lb

## 2020-10-06 DIAGNOSIS — R351 Nocturia: Secondary | ICD-10-CM | POA: Diagnosis not present

## 2020-10-06 DIAGNOSIS — K4090 Unilateral inguinal hernia, without obstruction or gangrene, not specified as recurrent: Secondary | ICD-10-CM

## 2020-10-06 DIAGNOSIS — N401 Enlarged prostate with lower urinary tract symptoms: Secondary | ICD-10-CM

## 2020-10-06 NOTE — Progress Notes (Signed)
10/06/20  Chief Complaint  Patient presents with  . Cysto     HPI: 75 year old male with refractory urinary symptoms related to BPH who presents today to the office for cystoscopy and prostate sizing   Please see previous notes for details.     Blood pressure (!) 163/103, pulse 80, weight 218 lb (98.9 kg). NED. A&Ox3.   No respiratory distress   Abd soft, NT, ND Normal phallus with bilateral descended testicles Rectal exam with small rubbery prostate, nonnodular     Cystoscopy Procedure Note  Patient identification was confirmed, informed consent was obtained, and patient was prepped using Betadine solution.  Lidocaine jelly was administered per urethral meatus.    Preoperative abx where received prior to procedure.     Pre-Procedure: - Inspection reveals a normal caliber ureteral meatus.  Procedure: The flexible cystoscope was introduced without difficulty - No urethral strictures/lesions are present. - Normal prostate bilobar coaptation - Normal bladder neck - Bilateral ureteral orifices identified - Bladder mucosa  reveals no ulcers, tumors, or lesions - No bladder stones - Mild trabeculation  Retroflexion shows slight distortion of the bladder into left inguinal hernia without descent into the scrotum   Post-Procedure: - Patient tolerated the procedure well   Prostate transrectal ultrasound sizing   Informed consent was obtained after discussing risks/benefits of the procedure.  A time out was performed to ensure correct patient identity.   Pre-Procedure: -Transrectal probe was placed without difficulty -Transrectal Ultrasound performed revealing a 27.48 gm prostate measuring 4.21 x 2.91 x 4.39 cm (length) -No significant hypoechoic or median lobe noted      Assessment/ Plan:  1. BPH associated with nocturia Please see previous notes for details  Prostate is relatively small without an obvious obstructive appearance although he does have some mild  trabeculation of the bladder with somewhat distorted contour secondary to suspected left inguinal hernia  We do lengthy discussion today about various management options.  He is a reasonable candidate for UroLift we also discussed that he may have other contributing factors to his urinary symptoms including Denovo bladder activity versus possibly left inguinal hernia which notably enlarged with bladder filling causing some bladder distortion.  We discussed that it is possible that this may be kinking the bladder and causing some of his voiding symptoms.  We discussed management of the above.  He continues to be interested in [place] as primary treatment to relieve his outlet.  If this fails, will consider inguinal hernia repair as he is otherwise asymptomatic from this.  We discussed the risk of the procedure including risk of bleeding, infection, damage surrounding structures, implant complications such as migration or malpositioning amongst others.  All questions were answered.  Possible need for Foley catheter was also discussed if he has difficulty eating postoperatively.   - Urinalysis, Complete - CULTURE, URINE COMPREHENSIVE  2. Left inguinal hernia As above    Hollice Espy, MD

## 2020-10-06 NOTE — H&P (View-Only) (Signed)
10/06/20  Chief Complaint  Patient presents with  . Cysto     HPI: 74-year-old male with refractory urinary symptoms related to BPH who presents today to the office for cystoscopy and prostate sizing   Please see previous notes for details.     Blood pressure (!) 163/103, pulse 80, weight 218 lb (98.9 kg). NED. A&Ox3.   No respiratory distress   Abd soft, NT, ND Normal phallus with bilateral descended testicles Rectal exam with small rubbery prostate, nonnodular     Cystoscopy Procedure Note  Patient identification was confirmed, informed consent was obtained, and patient was prepped using Betadine solution.  Lidocaine jelly was administered per urethral meatus.    Preoperative abx where received prior to procedure.     Pre-Procedure: - Inspection reveals a normal caliber ureteral meatus.  Procedure: The flexible cystoscope was introduced without difficulty - No urethral strictures/lesions are present. - Normal prostate bilobar coaptation - Normal bladder neck - Bilateral ureteral orifices identified - Bladder mucosa  reveals no ulcers, tumors, or lesions - No bladder stones - Mild trabeculation  Retroflexion shows slight distortion of the bladder into left inguinal hernia without descent into the scrotum   Post-Procedure: - Patient tolerated the procedure well   Prostate transrectal ultrasound sizing   Informed consent was obtained after discussing risks/benefits of the procedure.  A time out was performed to ensure correct patient identity.   Pre-Procedure: -Transrectal probe was placed without difficulty -Transrectal Ultrasound performed revealing a 27.48 gm prostate measuring 4.21 x 2.91 x 4.39 cm (length) -No significant hypoechoic or median lobe noted      Assessment/ Plan:  1. BPH associated with nocturia Please see previous notes for details  Prostate is relatively small without an obvious obstructive appearance although he does have some mild  trabeculation of the bladder with somewhat distorted contour secondary to suspected left inguinal hernia  We do lengthy discussion today about various management options.  He is a reasonable candidate for UroLift we also discussed that he may have other contributing factors to his urinary symptoms including Denovo bladder activity versus possibly left inguinal hernia which notably enlarged with bladder filling causing some bladder distortion.  We discussed that it is possible that this may be kinking the bladder and causing some of his voiding symptoms.  We discussed management of the above.  He continues to be interested in UroLift as primary treatment to relieve his outlet.  If this fails, will consider inguinal hernia repair as he is otherwise asymptomatic from this.  We discussed the risk of the procedure including risk of bleeding, infection, damage surrounding structures, implant complications such as migration or malpositioning amongst others.  All questions were answered.  Possible need for Foley catheter was also discussed if he has difficulty eating postoperatively.   - Urinalysis, Complete - CULTURE, URINE COMPREHENSIVE  2. Left inguinal hernia As above    Christana Angelica, MD     

## 2020-10-07 LAB — URINALYSIS, COMPLETE
Bilirubin, UA: NEGATIVE
Glucose, UA: NEGATIVE
Ketones, UA: NEGATIVE
Leukocytes,UA: NEGATIVE
Nitrite, UA: NEGATIVE
Protein,UA: NEGATIVE
RBC, UA: NEGATIVE
Specific Gravity, UA: 1.02 (ref 1.005–1.030)
Urobilinogen, Ur: 1 mg/dL (ref 0.2–1.0)
pH, UA: 7 (ref 5.0–7.5)

## 2020-10-07 LAB — MICROSCOPIC EXAMINATION
Bacteria, UA: NONE SEEN
RBC, Urine: NONE SEEN /hpf (ref 0–2)

## 2020-10-08 ENCOUNTER — Encounter
Admission: RE | Admit: 2020-10-08 | Discharge: 2020-10-08 | Disposition: A | Discharge status: Home / Self Care | Payer: Medicare Other | Source: Ambulatory Visit | Attending: Urology | Admitting: Urology

## 2020-10-08 ENCOUNTER — Other Ambulatory Visit: Payer: Self-pay

## 2020-10-08 ENCOUNTER — Other Ambulatory Visit
Admission: RE | Admit: 2020-10-08 | Discharge: 2020-10-08 | Disposition: A | Discharge status: Home / Self Care | Payer: Medicare Other | Source: Ambulatory Visit | Attending: Urology | Admitting: Urology

## 2020-10-08 ENCOUNTER — Telehealth: Payer: Self-pay | Admitting: *Deleted

## 2020-10-08 DIAGNOSIS — Z20822 Contact with and (suspected) exposure to covid-19: Secondary | ICD-10-CM | POA: Insufficient documentation

## 2020-10-08 DIAGNOSIS — Z01812 Encounter for preprocedural laboratory examination: Secondary | ICD-10-CM | POA: Insufficient documentation

## 2020-10-08 HISTORY — DX: Sleep apnea, unspecified: G47.30

## 2020-10-08 HISTORY — DX: Anemia, unspecified: D64.9

## 2020-10-08 NOTE — Patient Instructions (Addendum)
Your procedure is scheduled on:10-12-20 MONDAY Report to the Registration Desk on the 1st floor of the [facility]-Then proceed to the 2nd floor Surgery Desk in the [facility] To find out your arrival time, please call [phone] between 1PM - 3PM on:10-09-20 FRIDAY  REMEMBER: Instructions that are not followed completely may result in serious medical risk, up to and including death; or upon the discretion of your surgeon and anesthesiologist your surgery may need to be rescheduled.  Do not eat food or liquids after midnight the night before surgery.  No gum chewing, lozengers or hard candies.  TAKE THESE MEDICATIONS THE MORNING OF SURGERY WITH A SIP OF WATER: -BUPROPION (WELLBUTRIN) -ROBINUL (GLYCOPYRROLATE) -PRAVASTATIN (PRAVACHOL) -MINIPRESS (PRAZOSIN) -PRILOSEC (OMEPRAZOLE)-take one the night before and one on the morning of surgery - helps to prevent nausea after surgery.)  BRING ALBUTEROL [place]  Follow recommendations from Cardiologist, Pulmonologist or PCP regarding stopping Aspirin, Coumadin, Plavix, Eliquis, Pradaxa, or Pletal-OK TO CONTINUE 81 MG ASPIRIN PER DR BRANDON-DO NOT TAKE THE MORNING OF SURGERY  One week prior to surgery: Stop Anti-inflammatories (NSAIDS) such as Advil, Aleve, Ibuprofen, Motrin, Naproxen, Naprosyn and Aspirin based products such as Excedrin, Goodys Powder, BC Powder-OK TO TAKE TYLENOL IF NEEDED  Stop ANY OVER THE COUNTER supplements until after surgery.  No Alcohol for 24 hours before or after surgery.  No Smoking including e-cigarettes for 24 hours prior to surgery.  No chewable tobacco products for at least 6 hours prior to surgery.  No nicotine patches on the day of surgery.  Do not use any "recreational" drugs for at least a week prior to your surgery.  Please be advised that the combination of cocaine and anesthesia may have negative outcomes, up to and including death. If you test positive for cocaine, your surgery will be  cancelled.  On the morning of surgery brush your teeth with toothpaste and water, you may rinse your mouth with mouthwash if you wish. Do not swallow any toothpaste or mouthwash.  Do not wear jewelry, make-up, hairpins, clips or nail polish.  Do not wear lotions, powders, or perfumes.   Do not shave body from the neck down 48 hours prior to surgery just in case you cut yourself which could leave a site for infection.  Also, freshly shaved skin may become irritated if using the CHG soap.  Contact lenses, hearing aids and dentures may not be worn into surgery.  Do not bring valuables to the hospital. [facility] is not responsible for any missing/lost belongings or valuables.   Notify your doctor if there is any change in your medical condition (cold, fever, infection).  Wear comfortable clothing (specific to your surgery type) to the hospital.  Plan for stool softeners for home use; pain medications have a tendency to cause constipation. You can also help prevent constipation by eating foods high in fiber such as fruits and vegetables and drinking plenty of fluids as your diet allows.  After surgery, you can help prevent lung complications by doing breathing exercises.  Take deep breaths and cough every 1-2 hours. Your doctor may order a device called an Incentive Spirometer to help you take deep breaths. When coughing or sneezing, hold a pillow firmly against your incision with both hands. This is called "splinting." Doing this helps protect your incision. It also decreases belly discomfort.  If you are being admitted to the hospital overnight, leave your suitcase in the car. After surgery it may be brought to your room.  If you are being discharged the day of surgery, you will not be allowed to drive home. You will need a responsible adult (18 years or older) to drive you home and stay with you that night.   If you are taking public transportation, you will need to have a  responsible adult (18 years or older) with you. Please confirm with your physician that it is acceptable to use public transportation.   Please call the [facility] Dept. at [phone] if you have any questions about these instructions.  Visitation Policy:  Patients undergoing a surgery or procedure may have one family member or support person with them as long as that person is not COVID-19 positive or experiencing its symptoms.  That person may remain in the waiting area during the procedure.  Inpatient Visitation:    Visiting hours are 7 a.m. to 8 p.m. Patients will be allowed one visitor. The visitor may change daily. The visitor must pass COVID-19 screenings, use hand sanitizer when entering and exiting the patient's room and wear a mask at all times, including in the patient's room. Patients must also wear a mask when staff or their visitor are in the room. Masking is required regardless of vaccination status. Systemwide, no visitors 17 or younger.

## 2020-10-08 NOTE — Telephone Encounter (Signed)
From: Karen Kitchens, NP  Sent: 10/08/2020  4:42 PM EST  To: [facility]  Subject: Request for pre-operative cardiac clearance    Request for pre-operative cardiac clearance:    1. What type of surgery is being performed?  CYSTOSCOPY WITH INSERTION OF UROLIFT   2. When is this surgery scheduled?  10/12/20    3. Are there any medications that need to be held prior to surgery?  ASA - surgery ok with holding if needed   4. Practice name and name of physician performing surgery?  Performing surgeon: Dr. Hollice Espy, MD  Requesting clearance: Honor Loh, FNP-C     5. Anesthesia type (none, local, MAC, general)? MAC   6. What is the office phone and fax number?   Phone: [phone]  Fax: [phone]   ATTENTION: Unable to create telephone message as per your standard workflow. Directed by [facility] providers to send requests for cardiac clearance to this pool for appropriate distribution to provider covering pre-operative clearances.   Honor Loh, MSN, APRN, FNP-C, CEN  [facility]  Peri-operative Services Nurse Practitioner  Phone: [phone]  10/08/20 4:39 PM

## 2020-10-09 ENCOUNTER — Encounter: Payer: Self-pay | Admitting: Urology

## 2020-10-09 LAB — CULTURE, URINE COMPREHENSIVE

## 2020-10-09 LAB — SARS CORONAVIRUS 2 (TAT 6-24 HRS): SARS Coronavirus 2: NEGATIVE

## 2020-10-09 NOTE — Progress Notes (Signed)
[facility] Perioperative Services  Pre-Admission/Anesthesia Testing Clinical Review  Date: 10/09/20  Patient Demographics:  Name: Chris Welch DOB:   Dec 23, 1945 MRN:   [id number]  Planned Surgical Procedure(s):    Case: [id number] Date/Time: 10/12/20 0828   Procedure: CYSTOSCOPY WITH INSERTION OF UROLIFT (N/A )   Anesthesia type: Monitor Anesthesia Care   Pre-op diagnosis: benign prostatic hypertrophy with nocturia   Location: [facility] OR ROOM 10 / [place] ORS FOR ANESTHESIA GROUP   Surgeons: Hollice Espy, MD    NOTE: Available PAT nursing documentation and vital signs have been reviewed. Clinical nursing staff has updated patient's PMH/PSHx, current medication list, and drug allergies/intolerances to ensure comprehensive history available to assist in medical decision making as it pertains to the aforementioned surgical procedure and anticipated anesthetic course.   Clinical Discussion:  Chris Welch is a 75 y.o. male who is submitted for pre-surgical anesthesia review and clearance prior to him undergoing the above procedure. Patient has never been a smoker. Pertinent PMH includes: aortic atherosclerosis, syncope, HTN, HLD, esophageal stricture, carotid body tumor (s/p excision), RIGHT vocal cord paralysis, OSAH (cannot tolerate nocturnal PAP therapy), GERD (on daily PPI), OA, BPH, anxiety, depression.  Patient is followed by cardiology Marlou Porch, MD). He was last seen in the cardiology clinic on 09/04/2020; notes reviewed.  At the time of his clinic visit, patient doing well overall from a cardiovascular perspective.  He denied any chest pain, shortness of breath, PND, orthopnea, palpitations, or peripheral edema.  Patient suffered a syncopal episode back in 03/2020 felt to be related to vagal response versus dehydration.  Patient was seen in the ER with a blood pressure 75/56; responded well to fluid resuscitation.  Cardiac work-up at that time was negative.   He has had no further syncopal episodes since that time.  Patient followed by cardiology for labile hypertension.  He is currently on GDMT for his HTN diagnosis using ARB monotherapy.  Patient noted marked elevations in his blood pressure at times; blood pressure 100/64 in clinic.  Decision was made to continue with current dose of losartan daily.  He is on a statin for his HLD.  Patient underwent myocardial perfusion imaging study on 02/06/2014 that revealed no evidence of stress-induced myocardial ischemia or arrhythmia; however test was indeterminant due to target heart rate not being achieved.  Patient stable overall from a cardiovascular perspective.  ECG revealed normal sinus rhythm at a rate of 69 bpm with no evidence of ST depression or elevation.  No changes were made to patient medication regimen.  Patient scheduled to follow-up with outpatient cardiology in 1 year or sooner if needed.  Patient is scheduled to undergo an elective urological procedure on 10/12/2020 with Dr. Hollice Espy.  Given patient's past medical history significant for recent syncopal episode, presurgical cardiac clearance was sought by PAT team.  Per cardiology, "given past medical history and time since last visit, based on ACC/AHA guidelines, patient would be at an overall ACCEPTABLE risk for the planned procedure without further cardiovascular testing".  This patient is on daily antiplatelet therapy.  Cardiology has cleared patient to hold ASA if needed for 5 to 7 days prior to the procedure. He has been instructed on recommendations from his surgeon for continuing his daily low-dose ASA throughout the perioperative course, holding only on the morning of surgery.   He reports previous perioperative complications with anesthesia. He notes previous episodes of parasomnias and syncope following general anesthesia. He underwent a neuraxial anesthetic course at [facility] (ASA III) in 01/2016 with no documented  complications.  Additionally, patient underwent Bier blocks and 07/2019 and 11/2019 with no documented complications.  Vitals with BMI 10/06/2020 09/30/2020 09/04/2020  Height - 6\' 2"  6\' 0"   Weight 218 lbs 218 lbs 221 lbs  BMI 27.98 51.02 58.52  Systolic 778 242 353  Diastolic 614 97 64  Pulse 80 108 69    Providers/Specialists:   NOTE: Primary physician provider listed below. Patient may have been seen by APP or partner within same practice.   PROVIDER ROLE / SPECIALTY LAST Lu Duffel, MD Urology (Surgeon)  10/06/2020  Copland, Gay Filler, MD Primary Care Provider  05/29/2019  Candee Furbish, MD Cardiology  09/04/2020   Allergies:  Hydrocodone and Penicillins  Current Home Medications:   No current facility-administered medications for this encounter.   [name] Kitchen albuterol (PROVENTIL HFA;VENTOLIN HFA) 108 (90 Base) MCG/ACT inhaler  . aspirin EC 81 MG tablet  . buPROPion (WELLBUTRIN XL) 150 MG 24 hr tablet  . ferrous sulfate 325 (65 FE) MG tablet  . finasteride (PROSCAR) 5 MG tablet  . fluticasone (FLONASE) 50 MCG/ACT nasal spray  . glycopyrrolate (ROBINUL) 1 MG tablet  . losartan (COZAAR) 100 MG tablet  . omeprazole (PRILOSEC) 20 MG capsule  . pravastatin (PRAVACHOL) 20 MG tablet  . prazosin (MINIPRESS) 2 MG capsule   History:   Past Medical History:  Diagnosis Date  . Adenomatous colon polyp 2009  . Anemia   . Anxiety   . Arthritis    "hips, knees" (01/27/2016)  . Complication of anesthesia    bad dreams-OCC PASSES OUT AFTER ANETHESIA  . Depression   . Diverticulitis   . Diverticulosis   . Ear infection ear infection lasting past 2 months, still ongoing, pt on antibiotics & drops. pt states "ear infection has eaten holes through bilateral ear drums,  I am hard of hearing"  . Enlarged prostate   . Esophageal stricture   . Gastropathy    reactive  . GERD (gastroesophageal reflux disease)   . Hard of hearing   . History of carotid body tumor 2008  .  Hyperlipidemia   . Hypertension   . 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 Paralysis of right vocal cord 2008   happened during his carotid tumor surgery  . Pneumonia    x 3, last 2009 (01/27/2016)  . Sleep apnea    CANNOT TOLERATE CPAP  . 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 Vocal cord paralysis    Right   Past Surgical History:  Procedure Laterality Date  . APPENDECTOMY    . BACK SURGERY     LOWER  . CAROTID BODY TUMOR EXCISION    . CERVICAL PARAGANGLIOMA EXCISION Right 12/2006  . CHOLECYSTECTOMY OPEN    . CYST EXCISION Left 12/17/2019   Procedure: CYST EXCISION; EXCISION NAIL HORN REMNANT LEFT RING FINGER;  Surgeon: Daryll Brod, MD;  Location: [place];  Service: Orthopedics;  Laterality: Left;  IV REGIONAL  . DEEP NECK LYMPH NODE BIOPSY / EXCISION    . DIGIT NAIL REMOVAL Left 07/30/2019   Procedure: REMOVAL OF NAIL HORNS LEFT RING FINGER;  Surgeon: Daryll Brod, MD;  Location: [place];  Service: Orthopedics;  Laterality: Left;  IV REGIONAL FOREARM BLOCK  . ESOPHAGOGASTRODUODENOSCOPY (EGD) WITH ESOPHAGEAL DILATION    . INGUINAL HERNIA REPAIR Right 1954  . JOINT REPLACEMENT    .  KNEE ARTHROSCOPY Right   . LUMBAR LAMINECTOMY/DECOMPRESSION MICRODISCECTOMY Left 03/09/2013   Procedure: LUMBAR LAMINECTOMY/DECOMPRESSION MICRODISCECTOMY 1 LEVEL;  Surgeon: Eustace Moore, MD;  Location: [place] [facility];  Service: Neurosurgery;  Laterality: Left;  Left lumbar three-four extra-foraminal microdiscectomy  . PATELLA ARTHROPLASTY Right    has had 5 surgeries right knee  . TOTAL HIP ARTHROPLASTY Left 01/27/2016  . TOTAL HIP ARTHROPLASTY Left 01/27/2016   Procedure: TOTAL HIP ARTHROPLASTY ANTERIOR APPROACH;  Surgeon: Frederik Pear, MD;  Location: [place];  Service: Orthopedics;  Laterality: Left;  . UPPER GASTROINTESTINAL ENDOSCOPY    . VOCAL CORD IMPLANT     Family History  Problem Relation Age of Onset  . Arthritis Brother         back  . Emphysema Brother   . Cancer Brother        rare cancer-unknown type  . Heart disease Mother   . Emphysema Father        smoker  . Colon cancer Neg Hx   . Esophageal cancer Neg Hx   . Rectal cancer Neg Hx   . Stomach cancer Neg Hx    Social History   Tobacco Use  . Smoking status: Never Smoker  . Smokeless tobacco: Never Used  Vaping Use  . Vaping Use: Never used  Substance Use Topics  . Alcohol use: Yes    Alcohol/week: 3.0 standard drinks    Types: 3 Cans of beer per week    Comment: social 2 per week  . Drug use: No    Pertinent Clinical Results:  LABS: Labs reviewed: Acceptable for surgery.     Hospital Outpatient Visit on 10/08/2020  Component Date Value Ref Range Status  . SARS Coronavirus 2 10/08/2020 NEGATIVE  NEGATIVE Final   Comment: (NOTE) SARS-CoV-2 target nucleic acids are NOT DETECTED.  The SARS-CoV-2 RNA is generally detectable in upper and lower respiratory specimens during the acute phase of infection. Negative results do not preclude SARS-CoV-2 infection, do not rule out co-infections with other pathogens, and should not be used as the sole basis for treatment or other patient management decisions. Negative results must be combined with clinical observations, patient history, and epidemiological information. The expected result is Negative.  Fact Sheet for Patients: SugarRoll.be  Fact Sheet for Healthcare Providers: https://www.woods-mathews.com/  This test is not yet approved or cleared by the [place] FDA and  has been authorized for detection and/or diagnosis of SARS-CoV-2 by FDA under an Emergency Use Authorization (EUA). This EUA will remain  in effect (meaning this test can be used) for the duration of the COVID-19 declaration under Se                          ction 564(b)(1) of the Act, 21 U.S.C. section 360bbb-3(b)(1), unless the authorization is terminated or revoked  sooner.  Performed at [facility], [place] [address], [place], [place] [address]     ECG: Date: 09/04/2020 Time ECG obtained: 1213 PM Rate: 69 bpm Rhythm: normal sinus Axis (leads I and aVF): Normal Intervals: PR 196 ms. QRS 68 ms. QTc 430 ms. ST segment and T wave changes: No evidence of acute ST segment elevation or depression; evidence of old anteroseptal infarct Comparison: Similar to previous tracing obtained on 04/18/2020   IMAGING / PROCEDURES: CT CHEST W/CONTRAST performed on 06/05/2020 1. There is a prominent lobulated azygos with a punctate focus of increased density which may reflect contrast versus calcification. This  may correlate to outside reported radiograph finding. Outside radiograph is not available for review. 2. Mild centrilobular and ground-glass nodularity within the RIGHT middle lobe and RIGHT upper lobe, similar to 2011. Findings are most suggestive of an infectious or inflammatory process. 3. Aortic atherosclerosis  LEXISCAN performed on 02/06/2014 1. Indeterminate stress test since target heart rate was not achieved. 2. No chest pain, shortness of breath, arrhythmia, or EKG changes suggestive of ischemia noted 3. Correlate with nuclear study.  Impression and Plan:  Chris Welch has been referred for pre-anesthesia review and clearance prior to him undergoing the planned anesthetic and procedural courses. Available labs, pertinent testing, and imaging results were personally reviewed by me. This patient has been appropriately cleared by cardiology with an overall ACCEPTABLE risk of significant perioperative cardiovascular complications..   Based on clinical review performed today (10/09/20), barring any significant acute changes in the patient's overall condition, it is anticipated that he will be able to proceed with the planned surgical intervention. Any acute changes in clinical condition may necessitate his procedure being postponed and/or  cancelled. Pre-surgical instructions were reviewed with the patient during his PAT appointment and questions were fielded by PAT clinical staff.  Honor Loh, MSN, APRN, FNP-C, CEN [facility]  Peri-operative Services Nurse Practitioner Phone: [phone] 10/09/20 11:42 AM  NOTE: This note has been prepared using Dragon dictation software. Despite my best ability to proofread, there is always the potential that unintentional transcriptional errors may still occur from this process.

## 2020-10-09 NOTE — Telephone Encounter (Signed)
   Primary Cardiologist: Candee Furbish, MD  Chart reviewed as part of pre-operative protocol coverage. Given past medical history and time since last visit, based on ACC/AHA guidelines, Chris Welch would be at acceptable risk for the planned procedure without further cardiovascular testing.   His aspirin may be held for 5 to 7 days prior to his procedure.  Please resume as soon as hemostasis is achieved.  I will route this recommendation to the requesting party via Epic fax function and remove from pre-op pool.  Please call with questions.  [name]. [name] NP-C    10/09/2020, 7:54 AM [place] [address] Office [phone] Fax [phone]

## 2020-10-12 ENCOUNTER — Encounter
Admission: RE | Disposition: A | Discharge status: Home / Self Care | Payer: Self-pay | Source: Home / Self Care | Attending: Urology

## 2020-10-12 ENCOUNTER — Ambulatory Visit
Admission: RE | Admit: 2020-10-12 | Discharge: 2020-10-12 | Disposition: A | Discharge status: Home / Self Care | Payer: Medicare Other | Attending: Urology | Admitting: Urology

## 2020-10-12 ENCOUNTER — Ambulatory Visit: Payer: Medicare Other | Admitting: Urgent Care

## 2020-10-12 ENCOUNTER — Encounter: Payer: Self-pay | Admitting: Urology

## 2020-10-12 ENCOUNTER — Other Ambulatory Visit: Payer: Self-pay

## 2020-10-12 DIAGNOSIS — K4090 Unilateral inguinal hernia, without obstruction or gangrene, not specified as recurrent: Secondary | ICD-10-CM | POA: Insufficient documentation

## 2020-10-12 DIAGNOSIS — N3289 Other specified disorders of bladder: Secondary | ICD-10-CM | POA: Diagnosis not present

## 2020-10-12 DIAGNOSIS — N401 Enlarged prostate with lower urinary tract symptoms: Secondary | ICD-10-CM | POA: Diagnosis not present

## 2020-10-12 DIAGNOSIS — R351 Nocturia: Secondary | ICD-10-CM | POA: Insufficient documentation

## 2020-10-12 HISTORY — DX: Atherosclerosis of aorta: I70.0

## 2020-10-12 HISTORY — PX: CYSTOSCOPY WITH INSERTION OF UROLIFT: SHX6678

## 2020-10-12 SURGERY — CYSTOSCOPY WITH INSERTION OF UROLIFT
Anesthesia: General | Site: Prostate

## 2020-10-12 MED ORDER — OXYCODONE HCL 5 MG PO TABS
ORAL_TABLET | ORAL | Status: AC
  Administered 2020-10-12: 10 mg
  Filled 2020-10-12: qty 2

## 2020-10-12 MED ORDER — ACETAMINOPHEN 10 MG/ML IV SOLN
INTRAVENOUS | Status: AC
  Administered 2020-10-12: 1000 mg via INTRAVENOUS
  Filled 2020-10-12: qty 100

## 2020-10-12 MED ORDER — PROPOFOL 10 MG/ML IV BOLUS
INTRAVENOUS | Status: DC | PRN
  Administered 2020-10-12: 50 mg via INTRAVENOUS

## 2020-10-12 MED ORDER — CHLORHEXIDINE GLUCONATE 0.12 % MT SOLN
OROMUCOSAL | Status: AC
  Administered 2020-10-12: 15 mL via OROMUCOSAL
  Filled 2020-10-12: qty 15

## 2020-10-12 MED ORDER — OXYCODONE HCL 5 MG PO TABS
10.0000 mg | ORAL_TABLET | Freq: Once | ORAL | Status: AC
Start: 2020-10-12 — End: 2020-10-12

## 2020-10-12 MED ORDER — KETOROLAC TROMETHAMINE 30 MG/ML IJ SOLN
30.0000 mg | Freq: Once | INTRAMUSCULAR | Status: AC
  Administered 2020-10-12: 30 mg via INTRAVENOUS

## 2020-10-12 MED ORDER — PROPOFOL 500 MG/50ML IV EMUL
INTRAVENOUS | Status: DC | PRN
  Administered 2020-10-12: 150 ug/kg/min via INTRAVENOUS

## 2020-10-12 MED ORDER — ACETAMINOPHEN 10 MG/ML IV SOLN: 1000.0000 mg | Freq: Once | INTRAVENOUS | Status: DC | PRN

## 2020-10-12 MED ORDER — FENTANYL CITRATE (PF) 100 MCG/2ML IJ SOLN
INTRAMUSCULAR | Status: AC
  Filled 2020-10-12: qty 2

## 2020-10-12 MED ORDER — CHLORHEXIDINE GLUCONATE 0.12 % MT SOLN: 15.0000 mL | Freq: Once | OROMUCOSAL | Status: AC

## 2020-10-12 MED ORDER — LACTATED RINGERS IV SOLN: INTRAVENOUS | Status: DC

## 2020-10-12 MED ORDER — CEFAZOLIN SODIUM-DEXTROSE 2-4 GM/100ML-% IV SOLN
INTRAVENOUS | Status: AC
  Filled 2020-10-12: qty 100

## 2020-10-12 MED ORDER — CEFAZOLIN SODIUM-DEXTROSE 2-4 GM/100ML-% IV SOLN
2.0000 g | INTRAVENOUS | Status: AC
  Administered 2020-10-12: 2 g via INTRAVENOUS

## 2020-10-12 MED ORDER — FENTANYL CITRATE (PF) 100 MCG/2ML IJ SOLN
25.0000 ug | INTRAMUSCULAR | Status: DC | PRN
Start: 2020-10-12 — End: 2020-10-12

## 2020-10-12 MED ORDER — KETOROLAC TROMETHAMINE 30 MG/ML IJ SOLN
INTRAMUSCULAR | Status: AC
  Filled 2020-10-12: qty 1

## 2020-10-12 MED ORDER — FUROSEMIDE 10 MG/ML IJ SOLN: 10.0000 mg | Freq: Once | INTRAMUSCULAR | Status: AC

## 2020-10-12 MED ORDER — LIDOCAINE HCL URETHRAL/MUCOSAL 2 % EX GEL
CUTANEOUS | Status: AC
  Filled 2020-10-12: qty 10

## 2020-10-12 MED ORDER — ORAL CARE MOUTH RINSE: 15.0000 mL | Freq: Once | OROMUCOSAL | Status: AC

## 2020-10-12 MED ORDER — PROPOFOL 10 MG/ML IV BOLUS
INTRAVENOUS | Status: AC
  Filled 2020-10-12: qty 40

## 2020-10-12 MED ORDER — FUROSEMIDE 10 MG/ML IJ SOLN
INTRAMUSCULAR | Status: AC
  Administered 2020-10-12: 10 mg via INTRAVENOUS
  Filled 2020-10-12: qty 2

## 2020-10-12 MED ORDER — HYDROMORPHONE HCL 1 MG/ML IJ SOLN
0.5000 mg | Freq: Once | INTRAMUSCULAR | Status: AC
  Administered 2020-10-12: 0.5 mg via INTRAVENOUS

## 2020-10-12 MED ORDER — HYDROMORPHONE HCL 1 MG/ML IJ SOLN
INTRAMUSCULAR | Status: AC
  Filled 2020-10-12: qty 0.5

## 2020-10-12 MED ORDER — ONDANSETRON HCL 4 MG/2ML IJ SOLN: 4.0000 mg | Freq: Once | INTRAMUSCULAR | Status: DC | PRN

## 2020-10-12 MED ORDER — FENTANYL CITRATE (PF) 100 MCG/2ML IJ SOLN
INTRAMUSCULAR | Status: DC | PRN
  Administered 2020-10-12: 25 ug via INTRAVENOUS
  Administered 2020-10-12: 50 ug via INTRAVENOUS
  Administered 2020-10-12: 25 ug via INTRAVENOUS

## 2020-10-12 SURGICAL SUPPLY — 16 items
BAG DRAIN CYSTO-URO LG1000N (MISCELLANEOUS) ×2 IMPLANT
BAG DRN LRG CPC RND TRDRP CNTR (MISCELLANEOUS) ×1
BAG URO DRAIN 4000ML (MISCELLANEOUS) ×1 IMPLANT
CATH FOL 3WAY LX COUV 20X75 (CATHETERS) ×1 IMPLANT
GLOVE SURG ENC MOIS LTX SZ6.5 (GLOVE) ×2 IMPLANT
GOWN STRL REUS W/ TWL LRG LVL3 (GOWN DISPOSABLE) ×2 IMPLANT
GOWN STRL REUS W/TWL LRG LVL3 (GOWN DISPOSABLE) ×4
KIT TURNOVER CYSTO (KITS) ×2 IMPLANT
MANIFOLD NEPTUNE II (INSTRUMENTS) ×2 IMPLANT
PACK CYSTO AR (MISCELLANEOUS) ×2 IMPLANT
SET CYSTO W/LG BORE CLAMP LF (SET/KITS/TRAYS/PACK) ×2 IMPLANT
SET IRRIG Y TYPE TUR BLADDER L (SET/KITS/TRAYS/PACK) ×1 IMPLANT
SURGILUBE 2OZ TUBE FLIPTOP (MISCELLANEOUS) IMPLANT
SYSTEM UROLIFT (Male Continence) ×5 IMPLANT
WATER STERILE IRR 1000ML POUR (IV SOLUTION) ×2 IMPLANT
WATER STERILE IRR 3000ML UROMA (IV SOLUTION) ×2 IMPLANT

## 2020-10-12 NOTE — Anesthesia Postprocedure Evaluation (Signed)
Anesthesia Post Note  Patient: Chris Welch  Procedure(s) Performed: CYSTOSCOPY WITH INSERTION OF UROLIFT (N/A Prostate)  Patient location during evaluation: PACU Anesthesia Type: General Level of consciousness: awake and alert Pain management: pain level controlled Vital Signs Assessment: post-procedure vital signs reviewed and stable Respiratory status: spontaneous breathing, nonlabored ventilation, respiratory function stable and patient connected to nasal cannula oxygen Cardiovascular status: blood pressure returned to baseline and stable Postop Assessment: no apparent nausea or vomiting Anesthetic complications: no   No complications documented.   Last Vitals:  Vitals:   10/12/20 0931 10/12/20 0945  BP: 112/69 (!) 132/91  Pulse: 87 83  Resp: (!) 8 15  Temp: (!) 36.4 C   SpO2: 100% 100%    Last Pain:  Vitals:   10/12/20 0931  TempSrc:   PainSc: 0-No pain                 Arita Miss

## 2020-10-12 NOTE — Progress Notes (Signed)
Bladder scanner x 3 average amount 147 ml.

## 2020-10-12 NOTE — Op Note (Signed)
Preoperative diagnosis: BPH with obstructive symptomatology   Postoperative diagnosis: BPH with obstructive symptomatology   Principal procedure: Urolift procedure, with the placement of 5 implants.   Surgeon: Hollice Espy   Anesthesia: LMA   Complications: None   Drains: None   Estimated blood loss: < 5 mL   Indications: 75 year-old male with obstructive symptomatology secondary to BPH.  The patient's symptoms have progressed, and he has requested further management.  Management options including TURP with resection/ablation of the prostate as well as Urolift were discussed.  The patient has chosen to have a Urolift procedure.  He has been instructed to the procedure as well as risks and complications which include but are not limited to infection, bleeding, and inadequate treatment with the Urolift procedure alone, anesthetic complications, among others.  He understands these and desires to proceed.   Findings: Using the 17 French cystoscope, urethra and bladder were inspected.  There were no urethral lesions.  Prostatic urethra was obstructed secondary to bilobar hypertrophy.  The bladder was inspected circumferentially.  This revealed normal findings.   Description of procedure: The patient was properly identified in the holding area.  He received preoperative IV antibiotics.  He was taken to the operating room where general anesthetic was administered with the LMA.  He is placed in the dorsolithotomy position.  Genitalia and perineum were prepped and draped.  Proper timeout was performed.   A 17F cystoscope was inserted into the bladder with findings as described above.  The 1st pair of implants were placed at the bladder neck ~1.5 cm from the bladder Neck. The 2nd pair of implants were placed at the level of the verumontanum.   A repeat cysto was performed and another single implant was placed near the left apex in a stacked fashion where is a BPH nodule appreciated.   A final  cystoscopy was conducted first to inspect the location and state of each implant and second, to confirm the presence of a continuous anterior channel was present through the prostatic urethra with irrigation flow turned off.    5 implants were delivered in total.    Following this, the scope was removed.  After anesthetic reversal he was transported to the PACU in stable condition.  He tolerated the procedure well.   Plan: He will follow-up with me in 4 to 6 weeks for IPSS/PVR.

## 2020-10-12 NOTE — Anesthesia Preprocedure Evaluation (Addendum)
Anesthesia Evaluation  Patient identified by MRN, date of birth, ID band Patient awake    Reviewed: Allergy & Precautions, NPO status , Patient's Chart, lab work & pertinent test results  History of Anesthesia Complications (+) history of anesthetic complications  Airway Mallampati: II  TM Distance: >3 FB Neck ROM: Full    Dental  (+) Teeth Intact, Dental Advisory Given, Missing   Pulmonary sleep apnea , neg COPD, Patient abstained from smoking.Not current smoker,    Pulmonary exam normal breath sounds clear to auscultation       Cardiovascular Exercise Tolerance: Good METShypertension, Pt. on medications (-) CAD and (-) Past MI (-) dysrhythmias  Rhythm:Regular Rate:Normal  LEXISCAN performed on 02/06/2014 1. Indeterminate stress test since target heart rate was not achieved. 2. No chest pain, shortness of breath, arrhythmia, or EKG changes suggestive of ischemia noted 3. Correlate with nuclear study.    Neuro/Psych PSYCHIATRIC DISORDERS Anxiety Depression  Neuromuscular disease    GI/Hepatic Neg liver ROS, GERD  Medicated,  Endo/Other  negative endocrine ROSneg diabetes  Renal/GU negative Renal ROS     Musculoskeletal  (+) Arthritis ,   Abdominal   Peds  Hematology negative hematology ROS (+)   Anesthesia Other Findings Past Medical History: 2009: Adenomatous colon polyp No date: Anemia No date: Anxiety No date: Aortic atherosclerosis ([facility]) No date: Arthritis     Comment:  "hips, knees" (0/24/0973) No date: Complication of anesthesia     Comment:  bad dreams-OCC PASSES OUT AFTER ANETHESIA No date: Depression No date: Diverticulitis No date: Diverticulosis ear infection lasting past 2 months, still ongoing, pt on antibiotics  & drops. pt states "ear infection has eaten holes through bilateral  ear drums,  I am hard of hearing": Ear infection No date: Enlarged prostate No date: Esophageal stricture No  date: Gastropathy     Comment:  reactive No date: GERD (gastroesophageal reflux disease) No date: Hard of hearing 2008: History of carotid body tumor No date: Hyperlipidemia No date: Hypertension No date: Malignant carotid body tumor [facility])     Comment:  2008 2008: Paralysis of right vocal cord     Comment:  happened during his carotid tumor surgery No date: Pneumonia     Comment:  x 3, last 2009 (01/27/2016) No date: Sleep apnea     Comment:  CANNOT TOLERATE CPAP 09/2006: Tumor of soft tissue of neck     Comment:  Right Cervical Paraganglioma No date: Vocal cord paralysis     Comment:  Right  Reproductive/Obstetrics                            Anesthesia Physical  Anesthesia Plan  ASA: II  Anesthesia Plan: General   Post-op Pain Management:    Induction: Intravenous  PONV Risk Score and Plan: 3 and Ondansetron, Propofol infusion, TIVA and Dexamethasone  Airway Management Planned: Nasal Cannula  Additional Equipment: None  Intra-op Plan:   Post-operative Plan:   Informed Consent: I have reviewed the patients History and Physical, chart, labs and discussed the procedure including the risks, benefits and alternatives for the proposed anesthesia with the patient or authorized representative who has indicated his/her understanding and acceptance.     Dental advisory given  Plan Discussed with: CRNA and Surgeon  Anesthesia Plan Comments: (Discussed risks of anesthesia with patient, including possibility of difficulty with spontaneous ventilation under anesthesia necessitating airway intervention, PONV, and rare risks such as cardiac or respiratory or neurological  events. Patient understands.)        Anesthesia Quick Evaluation

## 2020-10-12 NOTE — Interval H&P Note (Signed)
History and Physical Interval Note:  10/12/2020 8:41 AM  Chris Welch  has presented today for surgery, with the diagnosis of BENIGN PROSTATIC HYPERTROPHY WITH NOCTURIA.  The various methods of treatment have been discussed with the patient and family. After consideration of risks, benefits and other options for treatment, the patient has consented to  Procedure(s): CYSTOSCOPY WITH INSERTION OF UROLIFT (N/A) as a surgical intervention.  The patient's history has been reviewed, patient examined, no change in status, stable for surgery.  I have reviewed the patient's chart and labs.  Questions were answered to the patient's satisfaction.    RRR CTAB   Hollice Espy

## 2020-10-12 NOTE — Progress Notes (Signed)
Bladder scan x 3 average amount 60 ml.

## 2020-10-12 NOTE — Discharge Instructions (Signed)
Urolift Post-Operative Instructions     Patient Expectations   1. Mild blood in your urine for about 1 week.  2. Urinary buring, frequency, and urgency for 10 days.  3. Mild pelvic pain 1-2 weeks.     Return to Activity     1. Drink water post procedure.  2. Take meds as needed.  Tylenol and/or Motrin is most helpful.  You may also by Pyridium/Azo over-the-counter for urinary burning.  3. No lifting or straining 48hrs.  4. Other activity when they feel up to it.   AMBULATORY SURGERY  DISCHARGE INSTRUCTIONS   1) The drugs that you were given will stay in your system until tomorrow so for the next 24 hours you should not:  A) Drive an automobile B) Make any legal decisions C) Drink any alcoholic beverage   2) You may resume regular meals tomorrow.  Today it is better to start with liquids and gradually work up to solid foods.  You may eat anything you prefer, but it is better to start with liquids, then soup and crackers, and gradually work up to solid foods.   3) Please notify your doctor immediately if you have any unusual bleeding, trouble breathing, redness and pain at the surgery site, drainage, fever, or pain not relieved by medication.    4) Additional Instructions:        Please contact your physician with any problems or Same Day Surgery at [phone], Monday through Friday 6 am to 4 pm, or [place] at  Main number at [phone]. 

## 2020-10-12 NOTE — Progress Notes (Signed)
Pt voided 400 ml sanguinous urine with one nickel sized clot and smaller clots. Notified md, this is okay. Post void residual 61ml. Pt discharged with all belongings and avs documents.

## 2020-10-12 NOTE — Transfer of Care (Signed)
Immediate Anesthesia Transfer of Care Note  Patient: Chris Welch  Procedure(s) Performed: CYSTOSCOPY WITH INSERTION OF UROLIFT (N/A Prostate)  Patient Location: PACU  Anesthesia Type:General  Level of Consciousness: sedated  Airway & Oxygen Therapy: Patient Spontanous Breathing and Patient connected to face mask oxygen  Post-op Assessment: Report given to RN and Post -op Vital signs reviewed and stable  Post vital signs: Reviewed  Last Vitals:  Vitals Value Taken Time  BP    Temp    Pulse    Resp    SpO2      Last Pain:  Vitals:   10/12/20 0723  TempSrc: Temporal  PainSc: 0-No pain         Complications: No complications documented.

## 2020-10-12 NOTE — Progress Notes (Signed)
Pt to bathroom and attempted to urinate. Small drips of blood from tip of penis with no urine. Po fluid intake encouraged, pt has estimated 500 ml po fluid with additional 1500 ml fluid IV since arrival to post op 4 hours prior. Patient continues to report continued pain with minimal relief from analgesics given. Please refer to [facility]. Wife at bedside offering emotional support.  MD made aware of last bladder scan volume of 164ml. No further orders received.

## 2020-10-13 ENCOUNTER — Encounter: Payer: Self-pay | Admitting: Urology

## 2020-10-16 DIAGNOSIS — M19031 Primary osteoarthritis, right wrist: Secondary | ICD-10-CM | POA: Diagnosis not present

## 2020-11-11 ENCOUNTER — Ambulatory Visit: Payer: Medicare Other | Admitting: Urology

## 2020-11-25 ENCOUNTER — Encounter: Payer: Self-pay | Admitting: Urology

## 2020-11-25 ENCOUNTER — Other Ambulatory Visit: Payer: Self-pay

## 2020-11-25 ENCOUNTER — Ambulatory Visit (INDEPENDENT_AMBULATORY_CARE_PROVIDER_SITE_OTHER): Payer: Medicare Other | Admitting: Urology

## 2020-11-25 VITALS — BP 166/102 | HR 79 | Ht 75.0 in | Wt 218.0 lb

## 2020-11-25 DIAGNOSIS — R351 Nocturia: Secondary | ICD-10-CM

## 2020-11-25 DIAGNOSIS — N401 Enlarged prostate with lower urinary tract symptoms: Secondary | ICD-10-CM

## 2020-11-25 LAB — BLADDER SCAN AMB NON-IMAGING: Scan Result: 9

## 2020-11-25 MED ORDER — FINASTERIDE 5 MG PO TABS: 5.0000 mg | ORAL_TABLET | Freq: Every day | ORAL | 0 refills | Status: DC

## 2020-11-25 NOTE — Progress Notes (Signed)
11/25/2020 10:08 AM   Chris Welch Jul 21, 1946 [id number]  Referring provider: Darreld Mclean, MD [address] [place],  [place] [address]  Chief Complaint  Patient presents with  . Benign Prostatic Hypertrophy    HPI: 75 year old male who presents today for follow-up after UroLift procedure on 10/12/2020.  He underwent uncomplicated placement of 5 implants.  He initially had some trouble urinating in the PACU but ultimately was successful is able to empty completely.  Preop IPSS 24/5.  Previously failed Flomax.  Still taking finasteride daily.  IPSS as below, he reports dramatic improvement in his urinary symptoms.  He longer has hesitancy, feels like his stream is excellent.  He still getting up 3 times at night to pee but wonders if this may be a habit.  This is with significant less urgency and with more ease.  He is very glad he had the procedure.    IPSS    Row Name 11/25/20 0900         International Prostate Symptom Score   How often have you had the sensation of not emptying your bladder? Less than 1 in 5     How often have you had to urinate less than every two hours? About half the time     How often have you found you stopped and started again several times when you urinated? Not at All     How often have you found it difficult to postpone urination? Not at All     How often have you had a weak urinary stream? Not at All     How often have you had to strain to start urination? Not at All     How many times did you typically get up at night to urinate? 3 Times     Total IPSS Score 7           Quality of Life due to urinary symptoms   If you were to spend the rest of your life with your urinary condition just the way it is now how would you feel about that? Mostly Satisfied            Score:  1-7 Mild 8-19 Moderate 20-35 Severe   PMH: Past Medical History:  Diagnosis Date  . Adenomatous colon polyp 2009  . Anemia   .  Anxiety   . Aortic atherosclerosis ([place])   . Arthritis    "hips, knees" (01/27/2016)  . Complication of anesthesia    bad dreams-OCC PASSES OUT AFTER ANETHESIA  . Depression   . Diverticulitis   . Diverticulosis   . Ear infection ear infection lasting past 2 months, still ongoing, pt on antibiotics & drops. pt states "ear infection has eaten holes through bilateral ear drums,  I am hard of hearing"  . Enlarged prostate   . Esophageal stricture   . Gastropathy    reactive  . GERD (gastroesophageal reflux disease)   . Hard of hearing   . History of carotid body tumor 2008  . Hyperlipidemia   . Hypertension   . 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 Paralysis of right vocal cord 2008   happened during his carotid tumor surgery  . Pneumonia    x 3, last 2009 (01/27/2016)  . Sleep apnea    CANNOT TOLERATE CPAP  . 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 Vocal cord paralysis  Right    Surgical History: Past Surgical History:  Procedure Laterality Date  . APPENDECTOMY    . BACK SURGERY     LOWER  . CAROTID BODY TUMOR EXCISION    . CERVICAL PARAGANGLIOMA EXCISION Right 12/2006  . CHOLECYSTECTOMY OPEN    . CYST EXCISION Left 12/17/2019   Procedure: CYST EXCISION; EXCISION NAIL HORN REMNANT LEFT RING FINGER;  Surgeon: Daryll Brod, MD;  Location: [facility];  Service: Orthopedics;  Laterality: Left;  IV REGIONAL  . CYSTOSCOPY WITH INSERTION OF UROLIFT N/A 10/12/2020   Procedure: CYSTOSCOPY WITH INSERTION OF UROLIFT;  Surgeon: Hollice Espy, MD;  Location: [facility];  Service: Urology;  Laterality: N/A;  . DEEP NECK LYMPH NODE BIOPSY / EXCISION    . DIGIT NAIL REMOVAL Left 07/30/2019   Procedure: REMOVAL OF NAIL HORNS LEFT RING FINGER;  Surgeon: Daryll Brod, MD;  Location: [facility];  Service: Orthopedics;  Laterality: Left;  IV REGIONAL FOREARM BLOCK  . ESOPHAGOGASTRODUODENOSCOPY (EGD) WITH ESOPHAGEAL DILATION     . INGUINAL HERNIA REPAIR Right 1954  . JOINT REPLACEMENT    . KNEE ARTHROSCOPY Right   . LUMBAR LAMINECTOMY/DECOMPRESSION MICRODISCECTOMY Left 03/09/2013   Procedure: LUMBAR LAMINECTOMY/DECOMPRESSION MICRODISCECTOMY 1 LEVEL;  Surgeon: Eustace Moore, MD;  Location: [facility];  Service: Neurosurgery;  Laterality: Left;  Left lumbar three-four extra-foraminal microdiscectomy  . PATELLA ARTHROPLASTY Right    has had 5 surgeries right knee  . TOTAL HIP ARTHROPLASTY Left 01/27/2016  . TOTAL HIP ARTHROPLASTY Left 01/27/2016   Procedure: TOTAL HIP ARTHROPLASTY ANTERIOR APPROACH;  Surgeon: Frederik Pear, MD;  Location: [facility];  Service: Orthopedics;  Laterality: Left;  . UPPER GASTROINTESTINAL ENDOSCOPY    . VOCAL CORD IMPLANT      Home Medications:  Allergies as of 11/25/2020      Reactions   Hydrocodone Nausea And Vomiting   "can not take on an empty stomach"   Penicillins Other (See Comments), Rash   Ulcers on eyes      Medication List       Accurate as of November 25, 2020 10:08 AM. If you have any questions, ask your nurse or doctor.        albuterol 108 (90 Base) MCG/ACT inhaler Commonly known as: VENTOLIN HFA Inhale 2 puffs into the lungs every 6 (six) hours as needed for wheezing or shortness of breath.   aspirin EC 81 MG tablet Take 81 mg by mouth daily.   buPROPion 150 MG 24 hr tablet Commonly known as: WELLBUTRIN XL Take 3 tablets (450 mg total) by mouth daily. What changed: when to take this   ferrous sulfate 325 (65 FE) MG tablet Take 325 mg by mouth every Monday, Wednesday, and Friday.   finasteride 5 MG tablet Commonly known as: Proscar Take 1 tablet (5 mg total) by mouth daily. 1 tablet every other day What changed: additional instructions Changed by: Hollice Espy, MD   fluticasone 50 MCG/ACT nasal spray Commonly known as: FLONASE Place 2 sprays into both nostrils daily. What changed:   when to take this  reasons to take this   glycopyrrolate 1 MG  tablet Commonly known as: ROBINUL Take 1 tablet (1 mg total) by mouth 3 (three) times daily. What changed:   when to take this  additional instructions   losartan 100 MG tablet Commonly known as: COZAAR Take 100 mg by mouth every morning.   omeprazole 20 MG capsule Commonly known as: PRILOSEC Take 20 mg by mouth  every morning.   pravastatin 20 MG tablet Commonly known as: PRAVACHOL Take 1 tablet (20 mg total) by mouth daily. What changed: when to take this   prazosin 2 MG capsule Commonly known as: MINIPRESS Take 4 mg by mouth every morning.       Allergies:  Allergies  Allergen Reactions  . Hydrocodone Nausea And Vomiting    "can not take on an empty stomach"  . Penicillins Other (See Comments) and Rash    Ulcers on eyes    Family History: Family History  Problem Relation Age of Onset  . Arthritis Brother        back  . Emphysema Brother   . Cancer Brother        rare cancer-unknown type  . Heart disease Mother   . Emphysema Father        smoker  . Colon cancer Neg Hx   . Esophageal cancer Neg Hx   . Rectal cancer Neg Hx   . Stomach cancer Neg Hx     Social History:  reports that he has never smoked. He has never used smokeless tobacco. He reports current alcohol use of about 3.0 standard drinks of alcohol per week. He reports that he does not use drugs.   Physical Exam: BP (!) 166/102   Pulse 79   Ht 6\' 3"  (1.905 m)   Wt 218 lb (98.9 kg)   BMI 27.25 kg/m   Constitutional:  Alert and oriented, No acute distress. HEENT: [place] AT, moist mucus membranes.  Trachea midline, no masses. Cardiovascular: No clubbing, cyanosis, or edema. Respiratory: Normal respiratory effort, no increased work of breathing. Skin: No rashes, bruises or suspicious lesions. Neurologic: Grossly intact, no focal deficits, moving all 4 extremities. Psychiatric: Normal mood and affect.  Results for orders placed or performed in visit on 11/25/20  BLADDER SCAN AMB NON-IMAGING   Result Value Ref Range   Scan Result 9 ml      Assessment & Plan:    1. BPH associated with nocturia S/p Urolift with excellent results, very pleased with voiding symptoms with dramatic improvement IPSS score  We discussed he will likely have some ongoing improvement in his urinary frequency including nocturia as his bladder begins to rehabilitate and with bladder training  Plan on weaning him off on his finasteride, will cut back to every other day for the time being.  He is agreeable this plan.  Follow-up in 1 year with IPSS/PVR or sooner as needed - Urinalysis, Complete   Hollice Espy, MD  [facility] [address], [place], [place] [address] [phone]

## 2021-02-15 ENCOUNTER — Encounter: Payer: Self-pay | Admitting: Family Medicine

## 2021-02-15 ENCOUNTER — Telehealth: Payer: Medicare Other | Admitting: Physician Assistant

## 2021-02-15 DIAGNOSIS — U071 COVID-19: Secondary | ICD-10-CM

## 2021-02-15 MED ORDER — ALBUTEROL SULFATE HFA 108 (90 BASE) MCG/ACT IN AERS: 2.0000 | INHALATION_SPRAY | Freq: Four times a day (QID) | RESPIRATORY_TRACT | 0 refills | Status: AC | PRN

## 2021-02-15 MED ORDER — BENZONATATE 100 MG PO CAPS: 100.0000 mg | ORAL_CAPSULE | Freq: Three times a day (TID) | ORAL | 0 refills | Status: DC | PRN

## 2021-02-15 MED ORDER — MOLNUPIRAVIR EUA 200MG CAPSULE
4.0000 | ORAL_CAPSULE | Freq: Two times a day (BID) | ORAL | 0 refills | Status: AC
  Filled 2021-02-15: qty 40, 5d supply, fill #0

## 2021-02-15 NOTE — Progress Notes (Signed)
E-Visit for Positive Covid Test Result We are sorry you are not feeling well. We are here to help!  You have tested positive for COVID-19, meaning that you were infected with the novel coronavirus and could give the virus to others.  It is vitally important that you stay home so you do not spread it to others.      Please continue isolation at home, for at least 10 days since the start of your symptoms and until you have had 24 hours with no fever (without taking a fever reducer) and with improving of symptoms.  If you have no symptoms but tested positive (or all symptoms resolve after 5 days and you have no fever) you can leave your house but continue to wear a mask around others for an additional 5 days. If you have a fever,continue to stay home until you have had 24 hours of no fever. Most cases improve 5-10 days from onset but we have seen a small number of patients who have gotten worse after the 10 days.  Please be sure to watch for worsening symptoms and remain taking the proper precautions.   Go to the nearest hospital ED for assessment if fever/cough/breathlessness are severe or illness seems like a threat to life.    The following symptoms may appear 2-14 days after exposure: . Fever . Cough . Shortness of breath or difficulty breathing . Chills . Repeated shaking with chills . Muscle pain . Headache . Sore throat . New loss of taste or smell . Fatigue . Congestion or runny nose . Nausea or vomiting . Diarrhea  You have been enrolled in [facility] for COVID-19. Daily you will receive a questionnaire within the [facility] website. Our COVID-19 response team will be monitoring your responses daily.  You can use medication such as prescription cough medication called Tessalon Perles 100 mg. You may take 1-2 capsules every 8 hours as needed for cough and  prescription inhaler called Albuterol MDI 90 mcg /actuation 2 puffs every 4 hours as needed for shortness of breath,  wheezing, cough   If you are interested in antiviral medications, you will need to be seen via video visit. This can be completed by selecting Virtual Urgent Care Visit from the [facility] Menu instead of e-visit.   You may also take acetaminophen (Tylenol) as needed for fever.  HOME CARE: . Only take medications as instructed by your medical team. . Drink plenty of fluids and get plenty of rest. . A steam or ultrasonic humidifier can help if you have congestion.   GET HELP RIGHT AWAY IF YOU HAVE EMERGENCY WARNING SIGNS.  Call 911 or proceed to your closest emergency facility if: . You develop worsening high fever. . Trouble breathing . Bluish lips or face . Persistent pain or pressure in the chest . New confusion . Inability to wake or stay awake . You cough up blood. . Your symptoms become more severe . Inability to hold down food or fluids  This list is not all possible symptoms. Contact your medical provider for any symptoms that are severe or concerning to you.    Your e-visit answers were reviewed by a board certified advanced clinical practitioner to complete your personal care plan.  Depending on the condition, your plan could have included both over the counter or prescription medications.  If there is a problem please reply once you have received a response from your provider.  Your safety is important to [place].  If you  have drug allergies check your prescription carefully.    You can use MyChart to ask questions about today's visit, request a non-urgent call back, or ask for a work or school excuse for 24 hours related to this e-Visit. If it has been greater than 24 hours you will need to follow up with your provider, or enter a new e-Visit to address those concerns. You will get an e-mail in the next two days asking about your experience.  I hope that your e-visit has been valuable and will speed your recovery. Thank you for using e-visits.  I provided 5 minutes of non  face-to-face time during this encounter for chart review and documentation.

## 2021-02-16 ENCOUNTER — Emergency Department (HOSPITAL_COMMUNITY): Payer: Medicare Other

## 2021-02-16 ENCOUNTER — Encounter (HOSPITAL_COMMUNITY): Payer: Self-pay | Admitting: Emergency Medicine

## 2021-02-16 ENCOUNTER — Emergency Department (HOSPITAL_COMMUNITY)
Admission: EM | Admit: 2021-02-16 | Discharge: 2021-02-16 | Disposition: A | Discharge status: Home / Self Care | Payer: Medicare Other | Attending: Emergency Medicine | Admitting: Emergency Medicine

## 2021-02-16 ENCOUNTER — Other Ambulatory Visit (HOSPITAL_BASED_OUTPATIENT_CLINIC_OR_DEPARTMENT_OTHER): Payer: Self-pay

## 2021-02-16 ENCOUNTER — Other Ambulatory Visit: Payer: Self-pay

## 2021-02-16 DIAGNOSIS — R000 Tachycardia, unspecified: Secondary | ICD-10-CM | POA: Insufficient documentation

## 2021-02-16 DIAGNOSIS — Z96651 Presence of right artificial knee joint: Secondary | ICD-10-CM | POA: Insufficient documentation

## 2021-02-16 DIAGNOSIS — I10 Essential (primary) hypertension: Secondary | ICD-10-CM | POA: Insufficient documentation

## 2021-02-16 DIAGNOSIS — Z96642 Presence of left artificial hip joint: Secondary | ICD-10-CM | POA: Diagnosis not present

## 2021-02-16 DIAGNOSIS — U071 COVID-19: Secondary | ICD-10-CM | POA: Insufficient documentation

## 2021-02-16 DIAGNOSIS — R059 Cough, unspecified: Secondary | ICD-10-CM | POA: Diagnosis present

## 2021-02-16 DIAGNOSIS — J9811 Atelectasis: Secondary | ICD-10-CM | POA: Diagnosis not present

## 2021-02-16 DIAGNOSIS — Z7982 Long term (current) use of aspirin: Secondary | ICD-10-CM | POA: Diagnosis not present

## 2021-02-16 DIAGNOSIS — R0602 Shortness of breath: Secondary | ICD-10-CM | POA: Diagnosis not present

## 2021-02-16 DIAGNOSIS — Z7689 Persons encountering health services in other specified circumstances: Secondary | ICD-10-CM | POA: Diagnosis not present

## 2021-02-16 DIAGNOSIS — Z79899 Other long term (current) drug therapy: Secondary | ICD-10-CM | POA: Diagnosis not present

## 2021-02-16 LAB — CBC WITH DIFFERENTIAL/PLATELET
Abs Immature Granulocytes: 0.02 10*3/uL (ref 0.00–0.07)
Basophils Absolute: 0 10*3/uL (ref 0.0–0.1)
Basophils Relative: 0 %
Eosinophils Absolute: 0 10*3/uL (ref 0.0–0.5)
Eosinophils Relative: 0 %
HCT: 38.7 % — ABNORMAL LOW (ref 39.0–52.0)
Hemoglobin: 12.9 g/dL — ABNORMAL LOW (ref 13.0–17.0)
Immature Granulocytes: 0 %
Lymphocytes Relative: 7 %
Lymphs Abs: 0.5 10*3/uL — ABNORMAL LOW (ref 0.7–4.0)
MCH: 29.9 pg (ref 26.0–34.0)
MCHC: 33.3 g/dL (ref 30.0–36.0)
MCV: 89.8 fL (ref 80.0–100.0)
Monocytes Absolute: 1 10*3/uL (ref 0.1–1.0)
Monocytes Relative: 13 %
Neutro Abs: 5.8 10*3/uL (ref 1.7–7.7)
Neutrophils Relative %: 80 %
Platelets: 143 10*3/uL — ABNORMAL LOW (ref 150–400)
RBC: 4.31 MIL/uL (ref 4.22–5.81)
RDW: 13.2 % (ref 11.5–15.5)
WBC: 7.3 10*3/uL (ref 4.0–10.5)
nRBC: 0 % (ref 0.0–0.2)

## 2021-02-16 LAB — COMPREHENSIVE METABOLIC PANEL
ALT: 25 U/L (ref 0–44)
AST: 26 U/L (ref 15–41)
Albumin: 3.5 g/dL (ref 3.5–5.0)
Alkaline Phosphatase: 75 U/L (ref 38–126)
Anion gap: 7 (ref 5–15)
BUN: 10 mg/dL (ref 8–23)
CO2: 26 mmol/L (ref 22–32)
Calcium: 8.7 mg/dL — ABNORMAL LOW (ref 8.9–10.3)
Chloride: 98 mmol/L (ref 98–111)
Creatinine, Ser: 1.18 mg/dL (ref 0.61–1.24)
GFR, Estimated: >60 mL/min (ref >60)
Glucose, Bld: 97 mg/dL (ref 70–99)
Potassium: 4.2 mmol/L (ref 3.5–5.1)
Sodium: 131 mmol/L — ABNORMAL LOW (ref 135–145)
Total Bilirubin: 1.4 mg/dL — ABNORMAL HIGH (ref 0.3–1.2)
Total Protein: 7.1 g/dL (ref 6.5–8.1)

## 2021-02-16 MED ORDER — AEROCHAMBER PLUS FLO-VU LARGE MISC
Status: AC
  Filled 2021-02-16: qty 1

## 2021-02-16 MED ORDER — SODIUM CHLORIDE 0.9 % IV BOLUS
1000.0000 mL | Freq: Once | INTRAVENOUS | Status: AC
  Administered 2021-02-16: 1000 mL via INTRAVENOUS

## 2021-02-16 MED ORDER — ALBUTEROL SULFATE HFA 108 (90 BASE) MCG/ACT IN AERS
2.0000 | INHALATION_SPRAY | RESPIRATORY_TRACT | Status: DC | PRN
  Filled 2021-02-16: qty 6.7

## 2021-02-16 MED ORDER — ACETAMINOPHEN 325 MG PO TABS
650.0000 mg | ORAL_TABLET | Freq: Once | ORAL | Status: AC
  Administered 2021-02-16: 650 mg via ORAL
  Filled 2021-02-16: qty 2

## 2021-02-16 NOTE — ED Notes (Signed)
Reviewed discharge instructions with patient. Follow-up care reviewed. Patient verbalized understanding. Patient A&Ox4, VSS, and ambulatory with steady gait upon discharge.  

## 2021-02-16 NOTE — ED Notes (Signed)
Pt maintained O2 sat above 92% w/ ambulation

## 2021-02-16 NOTE — ED Triage Notes (Signed)
Pt states being diagnosed COVID+ yesterday. C/o increase SOB. Requesting chest XR concerned he may have pneumonia. Denies CP

## 2021-02-16 NOTE — Discharge Instructions (Signed)
If you develop high fever, severe cough or cough with blood, trouble breathing, severe headache, neck pain/stiffness, vomiting, or any other new/concerning symptoms then return to the ER for evaluation  

## 2021-02-16 NOTE — ED Provider Notes (Signed)
[facility] EMERGENCY DEPARTMENT Provider Note   CSN: [id number] Arrival date & time: 02/16/21  0254     History Chief Complaint  Patient presents with  . Shortness of Breath    Chris Welch is a 75 y.o. male.  HPI 75 year old male presents with concern for pneumonia.  He developed symptoms of COVID yesterday around 10 AM.  This includes productive cough, shortness of breath and nausea/decreased appetite.  He states he is drinking some fluids okay.  No vomiting though he was nauseated this morning.  No sore throat or chest pain.  He tested positive yesterday.  Was prescribed Molnupiravir by PCP yesterday.  Past Medical History:  Diagnosis Date  . Adenomatous colon polyp 2009  . Anemia   . Anxiety   . Aortic atherosclerosis ([place])   . Arthritis    "hips, knees" (01/27/2016)  . Complication of anesthesia    bad dreams-OCC PASSES OUT AFTER ANETHESIA  . Depression   . Diverticulitis   . Diverticulosis   . Ear infection ear infection lasting past 2 months, still ongoing, pt on antibiotics & drops. pt states "ear infection has eaten holes through bilateral ear drums,  I am hard of hearing"  . Enlarged prostate   . Esophageal stricture   . Gastropathy    reactive  . GERD (gastroesophageal reflux disease)   . Hard of hearing   . History of carotid body tumor 2008  . Hyperlipidemia   . Hypertension   . 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 Paralysis of right vocal cord 2008   happened during his carotid tumor surgery  . Pneumonia    x 3, last 2009 (01/27/2016)  . Sleep apnea    CANNOT TOLERATE CPAP  . 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 Vocal cord paralysis    Right    Patient Active Problem List   Diagnosis Date Noted  . Degenerative joint disease (DJD) of hip 01/05/2016  . Atypical chest pain 04/22/2014  . Gastric reflux with aspiration 03/04/2014  . Carpal tunnel syndrome 11/26/2013  . HTN  (hypertension) 11/26/2013  . AR (allergic rhinitis) 11/26/2013  . Low sodium levels 03/13/2013  . BPH associated with nocturia 08/21/2012  . Hyperlipidemia 08/21/2012  . Recurrent aspiration bronchitis/pneumonia 10/05/2011  . Diverticulosis 10/05/2011  . Hearing loss of both ears 10/05/2011  . Depression 10/05/2011  . 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 APPENDECTOMY    . BACK SURGERY     LOWER  . CAROTID BODY TUMOR EXCISION    . CERVICAL PARAGANGLIOMA EXCISION Right 12/2006  . CHOLECYSTECTOMY OPEN    . CYST EXCISION Left 12/17/2019   Procedure: CYST EXCISION; EXCISION NAIL HORN REMNANT LEFT RING FINGER;  Surgeon: Daryll Brod, MD;  Location: [facility];  Service: Orthopedics;  Laterality: Left;  IV REGIONAL  . CYSTOSCOPY WITH INSERTION OF UROLIFT N/A 10/12/2020   Procedure: CYSTOSCOPY WITH INSERTION OF UROLIFT;  Surgeon: Hollice Espy, MD;  Location: [facility];  Service: Urology;  Laterality: N/A;  . DEEP NECK LYMPH NODE BIOPSY / EXCISION    . DIGIT NAIL REMOVAL Left 07/30/2019   Procedure: REMOVAL OF NAIL HORNS LEFT RING FINGER;  Surgeon: Daryll Brod, MD;  Location: [place];  Service: Orthopedics;  Laterality: Left;  IV REGIONAL FOREARM BLOCK  . ESOPHAGOGASTRODUODENOSCOPY (EGD) WITH ESOPHAGEAL DILATION    . INGUINAL  HERNIA REPAIR Right 1954  . JOINT REPLACEMENT    . KNEE ARTHROSCOPY Right   . LUMBAR LAMINECTOMY/DECOMPRESSION MICRODISCECTOMY Left 03/09/2013   Procedure: LUMBAR LAMINECTOMY/DECOMPRESSION MICRODISCECTOMY 1 LEVEL;  Surgeon: Eustace Moore, MD;  Location: [facility];  Service: Neurosurgery;  Laterality: Left;  Left lumbar three-four extra-foraminal microdiscectomy  . PATELLA ARTHROPLASTY Right    has had 5 surgeries right knee  . TOTAL HIP ARTHROPLASTY Left 01/27/2016  . TOTAL HIP ARTHROPLASTY Left 01/27/2016   Procedure: TOTAL HIP ARTHROPLASTY ANTERIOR APPROACH;  Surgeon: Frederik Pear,  MD;  Location: [place];  Service: Orthopedics;  Laterality: Left;  . UPPER GASTROINTESTINAL ENDOSCOPY    . VOCAL CORD IMPLANT         Family History  Problem Relation Age of Onset  . Arthritis Brother        back  . Emphysema Brother   . Cancer Brother        rare cancer-unknown type  . Heart disease Mother   . Emphysema Father        smoker  . Colon cancer Neg Hx   . Esophageal cancer Neg Hx   . Rectal cancer Neg Hx   . Stomach cancer Neg Hx     Social History   Tobacco Use  . Smoking status: Never Smoker  . Smokeless tobacco: Never Used  Vaping Use  . Vaping Use: Never used  Substance Use Topics  . Alcohol use: Yes    Alcohol/week: 3.0 standard drinks    Types: 3 Cans of beer per week    Comment: social 2 per week  . Drug use: No    Home Medications Prior to Admission medications   Medication Sig Start Date End Date Taking? Authorizing Provider  albuterol (VENTOLIN HFA) 108 (90 Base) MCG/ACT inhaler Inhale 2 puffs into the lungs every 6 (six) hours as needed for wheezing or shortness of breath. 02/15/21  Yes Mar Daring, PA-C  aspirin EC 81 MG tablet Take 81 mg by mouth daily.   Yes [provider]  benzonatate (TESSALON) 100 MG capsule Take 1 capsule (100 mg total) by mouth 3 (three) times daily as needed. 02/15/21  Yes Mar Daring, PA-C  buPROPion (WELLBUTRIN XL) 150 MG 24 hr tablet Take 3 tablets (450 mg total) by mouth daily. Patient taking differently: Take 450 mg by mouth in the morning, at noon, and at bedtime. 07/05/16  Yes Harrison Mons, PA  ferrous sulfate 325 (65 FE) MG tablet Take 325 mg by mouth every Monday, Wednesday, and Friday.   Yes [provider]  finasteride (PROSCAR) 5 MG tablet Take 1 tablet (5 mg total) by mouth daily. 1 tablet every other day 11/25/20  Yes Hollice Espy, MD  fluticasone [facility]) 50 MCG/ACT nasal spray Place 2 sprays into both nostrils daily. Patient taking differently: Place 2 sprays into  both nostrils daily as needed for allergies. 11/26/13  Yes Jeffery, Chelle, PA  glycopyrrolate (ROBINUL) 1 MG tablet Take 1 tablet (1 mg total) by mouth 3 (three) times daily. Patient taking differently: Take 1 mg by mouth every morning. AND AT BEDTIME PRN 04/20/18  Yes Copland, Gay Filler, MD  losartan (COZAAR) 100 MG tablet Take 100 mg by mouth every morning.   Yes [provider]  molnupiravir EUA 200 mg CAPS Take 4 capsules (800 mg total) by mouth 2 (two) times daily for 5 days. 02/15/21 02/21/21 Yes Copland, Gay Filler, MD  omeprazole (PRILOSEC) 20 MG capsule Take 20 mg by  mouth every morning.   Yes [provider]  pravastatin (PRAVACHOL) 20 MG tablet Take 1 tablet (20 mg total) by mouth daily. Patient taking differently: Take 20 mg by mouth every morning. 06/09/15  Yes Jerline Pain, MD  prazosin (MINIPRESS) 2 MG capsule Take 4 mg by mouth every morning.   Yes [provider]  COVID-19 mRNA vaccine, Pfizer, 30 MCG/0.3ML injection INJECT AS DIRECTED 09/25/20 09/25/21  Carlyle Basques, MD    Allergies    Hydrocodone and Penicillins  Review of Systems   Review of Systems  Constitutional: Negative for fever.  Respiratory: Positive for cough and shortness of breath.   Cardiovascular: Negative for chest pain.  Gastrointestinal: Negative for vomiting.  All other systems reviewed and are negative.   Physical Exam Updated Vital Signs BP (!) 130/94   Pulse 91   Temp 98.7 F (37.1 C) (Oral)   Resp 17   Ht 6\' 3"  (1.905 m)   Wt 98.9 kg   SpO2 96%   BMI 27.25 kg/m   Physical Exam Vitals and nursing note reviewed.  Constitutional:      General: He is not in acute distress.    Appearance: He is well-developed. He is not ill-appearing or diaphoretic.  HENT:     Head: Normocephalic and atraumatic.     Right Ear: External ear normal.     Left Ear: External ear normal.     Nose: Nose normal.  Eyes:     General:        Right eye: No discharge.        Left eye: No  discharge.  Cardiovascular:     Rate and Rhythm: Regular rhythm. Tachycardia present.     Heart sounds: Normal heart sounds.  Pulmonary:     Effort: Pulmonary effort is normal.     Breath sounds: Normal breath sounds. No wheezing.     Comments: Perhaps faint crackles RLL Abdominal:     Palpations: Abdomen is soft.     Tenderness: There is no abdominal tenderness.  Musculoskeletal:     Cervical back: Neck supple.  Skin:    General: Skin is warm and dry.  Neurological:     Mental Status: He is alert.  Psychiatric:        Mood and Affect: Mood is not anxious.     ED Results / Procedures / Treatments   Labs (all labs ordered are listed, but only abnormal results are displayed) Labs Reviewed  COMPREHENSIVE METABOLIC PANEL - Abnormal; Notable for the following components:      Result Value   Sodium 131 (*)    Calcium 8.7 (*)    Total Bilirubin 1.4 (*)    All other components within normal limits  CBC WITH DIFFERENTIAL/PLATELET - Abnormal; Notable for the following components:   Hemoglobin 12.9 (*)    HCT 38.7 (*)    Platelets 143 (*)    Lymphs Abs 0.5 (*)    All other components within normal limits    EKG EKG Interpretation  Date/Time:  Tuesday Feb 16 2021 07:06:57 EDT Ventricular Rate:  113 PR Interval:  170 QRS Duration: 66 QT Interval:  310 QTC Calculation: 425 R Axis:   35 Text Interpretation: Sinus tachycardia Low voltage QRS Cannot rule out Anteroseptal infarct , age undetermined  rate is faster compared to 2019 Confirmed by Sherwood Gambler [id number]) on 02/16/2021 7:25:35 AM   Radiology DG Chest 2 View  Result Date: 02/16/2021 CLINICAL DATA:  Shortness of breath.  COVID. EXAM: CHEST - 2 VIEW COMPARISON:  CT 06/05/2020.  Chest x-ray 12/04/2017. FINDINGS: Mediastinum hilar structures normal. Heart size normal. Low lung volumes with mild right perihilar subsegmental atelectasis. No focal infiltrate. No pleural effusion or pneumothorax. No acute bony abnormality.  Surgical clips right neck. IMPRESSION: Low lung volumes with mild perihilar subsegmental atelectasis. No focal infiltrate. Electronically Signed   By: Marcello Moores  Register   On: 02/16/2021 07:32    Procedures Procedures   Medications Ordered in ED Medications  albuterol (VENTOLIN HFA) 108 (90 Base) MCG/ACT inhaler 2 puff (has no administration in time range)  AeroChamber Plus Flo-Vu Large MISC (0 each  Hold 02/16/21 0919)  sodium chloride 0.9 % bolus 1,000 mL (0 mLs Intravenous Stopped 02/16/21 1118)  acetaminophen (TYLENOL) tablet 650 mg (650 mg Oral Given 02/16/21 3953)    ED Course  I have reviewed the triage vital signs and the nursing notes.  Pertinent labs & imaging results that were available during my care of the patient were reviewed by me and considered in my medical decision making (see chart for details).    MDM Rules/Calculators/A&P                          Patient does not appear ill.  Vitals are okay except he is tachycardic, probably from a low-grade fever.  He was given Tylenol and fluids.  His labs are unremarkable.  He feels well enough for discharge.  There is no obvious pneumonia.  He was started on antivirals by PCP.  Thus I do not think further treatment such as monoclonal antibodies is warranted.  Will discharge home with return precautions.  He ambulated without difficulty or hypoxia in the emergency department.  Chris Welch was evaluated in Emergency Department on 02/16/2021 for the symptoms described in the history of present illness. He was evaluated in the context of the global COVID-19 pandemic, which necessitated consideration that the patient might be at risk for infection with the SARS-CoV-2 virus that causes COVID-19. Institutional protocols and algorithms that pertain to the evaluation of patients at risk for COVID-19 are in a state of rapid change based on information released by regulatory bodies including the CDC and federal and state organizations.  These policies and algorithms were followed during the patient's care in the ED.  Final Clinical Impression(s) / ED Diagnoses Final diagnoses:  COVID-19 virus infection    Rx / DC Orders ED Discharge Orders    None       Sherwood Gambler, MD 02/16/21 1156

## 2021-03-12 ENCOUNTER — Telehealth: Payer: Self-pay | Admitting: Family Medicine

## 2021-03-12 NOTE — Telephone Encounter (Signed)
Spoke with patient he req a CB in July or August 2022

## 2021-04-28 DIAGNOSIS — L570 Actinic keratosis: Secondary | ICD-10-CM | POA: Diagnosis not present

## 2021-04-28 DIAGNOSIS — D485 Neoplasm of uncertain behavior of skin: Secondary | ICD-10-CM | POA: Diagnosis not present

## 2021-04-28 DIAGNOSIS — Z85828 Personal history of other malignant neoplasm of skin: Secondary | ICD-10-CM | POA: Diagnosis not present

## 2021-04-28 DIAGNOSIS — L821 Other seborrheic keratosis: Secondary | ICD-10-CM | POA: Diagnosis not present

## 2021-04-28 DIAGNOSIS — L720 Epidermal cyst: Secondary | ICD-10-CM | POA: Diagnosis not present

## 2021-05-18 DIAGNOSIS — M25562 Pain in left knee: Secondary | ICD-10-CM | POA: Diagnosis not present

## 2021-05-18 DIAGNOSIS — M25561 Pain in right knee: Secondary | ICD-10-CM | POA: Diagnosis not present

## 2021-05-31 ENCOUNTER — Telehealth: Payer: Self-pay | Admitting: Cardiology

## 2021-05-31 ENCOUNTER — Encounter: Payer: Self-pay | Admitting: Family Medicine

## 2021-05-31 DIAGNOSIS — K4090 Unilateral inguinal hernia, without obstruction or gangrene, not specified as recurrent: Secondary | ICD-10-CM

## 2021-05-31 NOTE — Telephone Encounter (Signed)
   Name: Chris Welch  DOB: 1945-12-12  MRN: [id number]   Primary Cardiologist: Candee Furbish, MD  Chart reviewed as part of pre-operative protocol coverage. Patient was contacted 05/31/2021 in reference to pre-operative risk assessment for pending surgery as outlined below.  Maze Sharaf Sammarco was last seen on 09/04/20 by Dr. Marlou Porch.  Since that day, Jeffry Riss Rana has done well. He does not have a history of ischemic heart disease or stroke. He can complete 4.0 METS without angina (stairs, golf, walking). He may hold ASA for 5-7 days prior to procedure and resume when safe after.  Therefore, based on ACC/AHA guidelines, the patient would be at acceptable risk for the planned procedure without further cardiovascular testing.   The patient was advised that if he develops new symptoms prior to surgery to contact our office to arrange for a follow-up visit, and he verbalized understanding.  I will route this recommendation to the requesting party via Epic fax function and remove from pre-op pool. Please call with questions.  Ledora Bottcher, PA 05/31/2021, 3:54 PM

## 2021-05-31 NOTE — Telephone Encounter (Signed)
   [facility] Pre-operative Risk Assessment    Request for surgical clearance:  What type of surgery is being performed?  Right Knee Replacement  When is this surgery scheduled?  06/18/21  What type of clearance is required (medical clearance vs. Pharmacy clearance to hold med vs. Both)?  Both   Are there any medications that need to be held prior to surgery and how long? Their office is requesting out recommendation   Practice name and name of physician performing surgery?  [facility]  Dr. Mayer Camel   What is your office phone number? [phone]    7.   What is your office fax number? [phone]  8.   Anesthesia type (None, local, MAC, general) ?  Spinal    Chris Welch 05/31/2021, 10:22 AM

## 2021-06-04 DIAGNOSIS — R2689 Other abnormalities of gait and mobility: Secondary | ICD-10-CM | POA: Diagnosis not present

## 2021-06-04 DIAGNOSIS — R531 Weakness: Secondary | ICD-10-CM | POA: Diagnosis not present

## 2021-06-04 DIAGNOSIS — M1711 Unilateral primary osteoarthritis, right knee: Secondary | ICD-10-CM | POA: Diagnosis not present

## 2021-06-04 NOTE — Progress Notes (Signed)
[facility] at [facility] [place], [place], [place] [address] [phone]  Date:  06/07/2021   Name:  Chris Welch   DOB:  Nov 20, 1945   MRN:  [id number]  PCP:  Darreld Mclean, MD    Chief Complaint: Pre-op Exam ([facility]/Concerns/ questions: rash on hips and thighs.)   History of Present Illness:  Chris Welch is a 75 y.o. 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  He does plan to have his left knee done soon as well  Covid booster-patient plan to get updated booster soon Flu shot-give today  EKG done in May of this year - abnormal - done in the ER when he was sick  He did have a pre-op clearance per cardiology already on 9/12 so I will only be clearing patient from a medical standpoint  Nareg reports he is able to play golf and walk without any chest pain He is okay to hold aspirin for 5 to 7 days prior to surgery BP Readings from Last 3 Encounters:  06/07/21 (!) 145/95  02/16/21 (!) 130/94  11/25/20 (!) 166/102    Patient Active Problem List   Diagnosis Date Noted   Degenerative joint disease (DJD) of hip 01/05/2016   Atypical chest pain 04/22/2014   Gastric reflux with aspiration 03/04/2014   Carpal tunnel syndrome 11/26/2013   HTN (hypertension) 11/26/2013   AR (allergic rhinitis) 11/26/2013   Low sodium levels 03/13/2013   BPH associated with nocturia  08/21/2012   Hyperlipidemia 08/21/2012   Recurrent aspiration bronchitis/pneumonia 10/05/2011   Diverticulosis 10/05/2011   Hearing loss of both ears 10/05/2011   Depression 10/05/2011   Vocal cord paralysis 01/17/2007    Past Medical History:  Diagnosis Date   Adenomatous colon polyp 2009   Anemia    Anxiety    Aortic atherosclerosis ([place])    Arthritis    "hips, knees" ([id number])   Complication of anesthesia    bad dreams-OCC PASSES OUT AFTER ANETHESIA   Depression    Diverticulitis    Diverticulosis    Ear infection ear infection lasting past 2 months, still ongoing, pt on antibiotics & drops. 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/2006   Right Cervical Paraganglioma   Vocal  cord paralysis    Right    Past Surgical History:  Procedure Laterality Date   APPENDECTOMY     BACK SURGERY     LOWER   CAROTID BODY TUMOR EXCISION     CERVICAL PARAGANGLIOMA EXCISION Right 12/2006   CHOLECYSTECTOMY OPEN     CYST EXCISION Left 12/17/2019   Procedure: CYST EXCISION; EXCISION NAIL HORN REMNANT LEFT RING FINGER;  Surgeon: Daryll Brod, MD;  Location: [facility];  Service: Orthopedics;  Laterality: Left;  IV REGIONAL   CYSTOSCOPY WITH INSERTION OF UROLIFT N/A 10/12/2020   Procedure: CYSTOSCOPY WITH INSERTION OF UROLIFT;  Surgeon: Hollice Espy, MD;  Location: [facility];  Service: Urology;  Laterality: N/A;   DEEP NECK LYMPH NODE BIOPSY / EXCISION     DIGIT NAIL REMOVAL  Left 07/30/2019   Procedure: REMOVAL OF NAIL HORNS LEFT RING FINGER;  Surgeon: Daryll Brod, MD;  Location: [facility];  Service: Orthopedics;  Laterality: Left;  IV REGIONAL FOREARM BLOCK   ESOPHAGOGASTRODUODENOSCOPY (EGD) WITH ESOPHAGEAL DILATION     INGUINAL HERNIA REPAIR Right 1954   JOINT REPLACEMENT     KNEE ARTHROSCOPY Right    LUMBAR LAMINECTOMY/DECOMPRESSION MICRODISCECTOMY Left 03/09/2013   Procedure: LUMBAR LAMINECTOMY/DECOMPRESSION MICRODISCECTOMY 1 LEVEL;  Surgeon: Eustace Moore, MD;  Location: [facility];  Service: Neurosurgery;  Laterality: Left;  Left lumbar three-four extra-foraminal microdiscectomy   PATELLA ARTHROPLASTY Right    has had 5 surgeries right knee   TOTAL HIP ARTHROPLASTY Left 01/27/2016   TOTAL HIP ARTHROPLASTY Left 01/27/2016   Procedure: TOTAL HIP ARTHROPLASTY ANTERIOR APPROACH;  Surgeon: Frederik Pear, MD;  Location: [facility];  Service: Orthopedics;  Laterality: Left;   UPPER GASTROINTESTINAL ENDOSCOPY     VOCAL CORD IMPLANT      Social History   Tobacco Use   Smoking status: Never   Smokeless tobacco: Never  Vaping Use   Vaping Use: Never used  Substance Use Topics   Alcohol use: Yes    Alcohol/week: 3.0 standard drinks    Types: 3 Cans of beer per week    Comment: social 2 per week   Drug use: No    Family History  Problem Relation Age of Onset   Arthritis Brother        back   Emphysema Brother    Cancer Brother        rare cancer-unknown type   Heart disease Mother    Emphysema Father        smoker   Colon cancer Neg Hx    Esophageal cancer Neg Hx    Rectal cancer Neg Hx    Stomach cancer Neg Hx     Allergies  Allergen Reactions   Hydrocodone Nausea And Vomiting    "can not take on an empty stomach"   Penicillins Other (See Comments) and Rash    Ulcers on eyes    Medication list has been reviewed and updated.  Current Outpatient Medications on File Prior to Visit  Medication Sig Dispense Refill   albuterol  (VENTOLIN HFA) 108 (90 Base) MCG/ACT inhaler Inhale 2 puffs into the lungs every 6 (six) hours as needed for wheezing or shortness of breath. 8 g 0   aspirin EC 81 MG tablet Take 81 mg by mouth daily.     benzonatate (TESSALON) 100 MG capsule Take 1 capsule (100 mg total) by mouth 3 (three) times daily as needed. 30 capsule 0   buPROPion (WELLBUTRIN XL) 150 MG 24 hr tablet  Take 3 tablets (450 mg total) by mouth daily. (Patient taking differently: Take 450 mg by mouth in the morning, at noon, and at bedtime.) 270 tablet 3   COVID-19 mRNA vaccine, Pfizer, 30 MCG/0.3ML injection INJECT AS DIRECTED .3 mL 0   ferrous sulfate 325 (65 FE) MG tablet Take 325 mg by mouth every Monday, Wednesday, and Friday.     finasteride (PROSCAR) 5 MG tablet Take 1 tablet (5 mg total) by mouth daily. 1 tablet every other day 30 tablet 0   fluticasone (FLONASE) 50 MCG/ACT nasal spray Place 2 sprays into both nostrils daily. (Patient taking differently: Place 2 sprays into both nostrils daily as needed for allergies.) 48 g 4   glycopyrrolate (ROBINUL) 1 MG tablet Take 1 tablet (1 mg total) by mouth 3 (three) times daily. (Patient taking differently: Take 1 mg by mouth every morning. AND AT BEDTIME PRN) 270 tablet 3   losartan (COZAAR) 100 MG tablet Take 100 mg by mouth every morning.     omeprazole (PRILOSEC) 20 MG capsule Take 20 mg by mouth every morning.     pravastatin (PRAVACHOL) 20 MG tablet Take 1 tablet (20 mg total) by mouth daily. (Patient taking differently: Take 20 mg by mouth every morning.) 90 tablet 3   No current facility-administered medications on file prior to visit.    Review of Systems:  As per HPI- otherwise negative.   Physical Examination: Vitals:   06/07/21 1314 06/07/21 1337  BP: (!) 156/90 (!) 145/95  Pulse: 90   Resp: 18   Temp: 98 F (36.7 C)   SpO2: 97%    Vitals:   06/07/21 1314  Weight: 219 lb 9.6 oz (99.6 kg)  Height: '6\' 3"'$  (1.905 m)   Body mass index is 27.45  kg/m. Ideal Body Weight: Weight in (lb) to have BMI = 25: 199.6  GEN: no acute distress.  Overweight, looks well  Vocal cord paralysis and associated posterior oropharynx asymmetry -1 has been present for several years.  Patient has slight voice change due to this vocal cord paralysis HEENT: Atraumatic, Normocephalic.  Ears and Nose: No external deformity. CV: RRR, No M/G/R. No JVD. No thrill. No extra heart sounds. PULM: CTA B, no wheezes, crackles, rhonchi. No retractions. No resp. distress. No accessory muscle use. ABD: S, NT, ND. No rebound. No HSM. EXTR: No c/c/e PSYCH: Normally interactive. Conversant.  Patient also points out a scaly patch on his left arm.  He notes he has a couple of similar patches in the groin area, thinks it is probably a yeast infection.  He has tried over-the-counter topical antifungals without success  Assessment and Plan: Pre-operative examination - Plan: CBC with Differential/Platelet, Comprehensive metabolic panel, Protime-INR, CANCELED: Protime-INR  Screening for prostate cancer - Plan: PSA, Medicare ( [facility] only)  Mixed hyperlipidemia - Plan: Lipid panel  Primary hypertension - Plan: amLODipine (NORVASC) 5 MG tablet  Tinea corporis - Plan: terbinafine (LAMISIL) 250 MG tablet  At high risk for bleeding - Plan: Protime-INR, CANCELED: Protime-INR  Medication monitoring encounter - Plan: Protime-INR, CANCELED: Protime-INR  Need for influenza vaccination - Plan: Flu Vaccine QUAD High Dose(Fluad)  Patient seen today for preoperative examination.  I have ordered labs requested by orthopedics.  He has already been cleared from a cardiac standpoint  Will give 2 weeks of terbinafine to 50 for tinea corporis  Flu vaccine given Noted blood pressure is elevated today.  Shade is taking losartan, but stopped his prazosin (which she was taking for  prostate symptoms) a few months ago-removal of this medication may have caused the blood pressure  increase.  We will add amlodipine 5 mg for blood pressure control.  He is asked to check his blood pressure at home and contact me in a few days with an updated reading.  We will need to make sure his blood pressure is satisfactory for surgery This visit occurred during the SARS-CoV-2 public health emergency.  Safety protocols were in place, including screening questions prior to the visit, additional usage of staff PPE, and extensive cleaning of exam room while observing appropriate contact time as indicated for disinfecting solutions.   Signed Lamar Blinks, MD  Received labs as below.  I have completed preop forms and sent to orthopedics Message to patient regarding labs  Results for orders placed or performed in visit on 06/07/21  CBC with Differential/Platelet  Result Value Ref Range   WBC 8.3 4.0 - 10.5 K/uL   RBC 4.66 4.22 - 5.81 Mil/uL   Hemoglobin 13.7 13.0 - 17.0 g/dL   HCT 41.2 39.0 - 52.0 %   MCV 88.4 78.0 - 100.0 fl   MCHC 33.2 30.0 - 36.0 g/dL   RDW 14.0 11.5 - 15.5 %   Platelets 201.0 150.0 - 400.0 K/uL   Neutrophils Relative % 79.4 (H) 43.0 - 77.0 %   Lymphocytes Relative 12.2 12.0 - 46.0 %   Monocytes Relative 6.7 3.0 - 12.0 %   Eosinophils Relative 1.3 0.0 - 5.0 %   Basophils Relative 0.4 0.0 - 3.0 %   Neutro Abs 6.6 1.4 - 7.7 K/uL   Lymphs Abs 1.0 0.7 - 4.0 K/uL   Monocytes Absolute 0.6 0.1 - 1.0 K/uL   Eosinophils Absolute 0.1 0.0 - 0.7 K/uL   Basophils Absolute 0.0 0.0 - 0.1 K/uL  Comprehensive metabolic panel  Result Value Ref Range   Sodium 137 135 - 145 mEq/L   Potassium 4.4 3.5 - 5.1 mEq/L   Chloride 99 96 - 112 mEq/L   CO2 28 19 - 32 mEq/L   Glucose, Bld 85 70 - 99 mg/dL   BUN 14 6 - 23 mg/dL   Creatinine, Ser 1.25 0.40 - 1.50 mg/dL   Total Bilirubin 1.1 0.2 - 1.2 mg/dL   Alkaline Phosphatase 85 39 - 117 U/L   AST 18 0 - 37 U/L   ALT 17 0 - 53 U/L   Total Protein 8.2 6.0 - 8.3 g/dL   Albumin 4.2 3.5 - 5.2 g/dL   GFR 56.64 (L) >60.00 mL/min    Calcium 9.3 8.4 - 10.5 mg/dL  Lipid panel  Result Value Ref Range   Cholesterol 167 0 - 200 mg/dL   Triglycerides 92.0 0.0 - 149.0 mg/dL   HDL 46.20 >39.00 mg/dL   VLDL 18.4 0.0 - 40.0 mg/dL   LDL Cholesterol 102 (H) 0 - 99 mg/dL   Total CHOL/HDL Ratio 4    NonHDL 120.46   PSA, Medicare ( [facility] only)  Result Value Ref Range   PSA 1.16 0.10 - 4.00 ng/ml  Protime-INR  Result Value Ref Range   INR 1.0 0.8 - 1.0 ratio   Prothrombin Time 11.0 9.6 - 13.1 sec

## 2021-06-04 NOTE — Patient Instructions (Addendum)
Good to see you again today! I will be in touch with your labs Please add the amlodipine 5 mg to your daily medications to help bring down your BP Please email me towards the end of this week with a BP update  Flu shot today Plan for covid booster asap

## 2021-06-07 ENCOUNTER — Encounter: Payer: Self-pay | Admitting: Family Medicine

## 2021-06-07 ENCOUNTER — Other Ambulatory Visit (HOSPITAL_BASED_OUTPATIENT_CLINIC_OR_DEPARTMENT_OTHER): Payer: Self-pay

## 2021-06-07 ENCOUNTER — Ambulatory Visit: Payer: Medicare Other | Attending: Internal Medicine

## 2021-06-07 ENCOUNTER — Ambulatory Visit (INDEPENDENT_AMBULATORY_CARE_PROVIDER_SITE_OTHER): Payer: Medicare Other | Admitting: Family Medicine

## 2021-06-07 ENCOUNTER — Other Ambulatory Visit: Payer: Self-pay

## 2021-06-07 VITALS — BP 145/95 | HR 90 | Temp 98.0°F | Resp 18 | Ht 75.0 in | Wt 219.6 lb

## 2021-06-07 DIAGNOSIS — I10 Essential (primary) hypertension: Secondary | ICD-10-CM

## 2021-06-07 DIAGNOSIS — E782 Mixed hyperlipidemia: Secondary | ICD-10-CM | POA: Diagnosis not present

## 2021-06-07 DIAGNOSIS — Z23 Encounter for immunization: Secondary | ICD-10-CM

## 2021-06-07 DIAGNOSIS — Z9189 Other specified personal risk factors, not elsewhere classified: Secondary | ICD-10-CM

## 2021-06-07 DIAGNOSIS — Z5181 Encounter for therapeutic drug level monitoring: Secondary | ICD-10-CM

## 2021-06-07 DIAGNOSIS — Z01818 Encounter for other preprocedural examination: Secondary | ICD-10-CM

## 2021-06-07 DIAGNOSIS — B354 Tinea corporis: Secondary | ICD-10-CM

## 2021-06-07 DIAGNOSIS — Z125 Encounter for screening for malignant neoplasm of prostate: Secondary | ICD-10-CM | POA: Diagnosis not present

## 2021-06-07 LAB — CBC WITH DIFFERENTIAL/PLATELET
Basophils Absolute: 0 10*3/uL (ref 0.0–0.1)
Basophils Relative: 0.4 % (ref 0.0–3.0)
Eosinophils Absolute: 0.1 10*3/uL (ref 0.0–0.7)
Eosinophils Relative: 1.3 % (ref 0.0–5.0)
HCT: 41.2 % (ref 39.0–52.0)
Hemoglobin: 13.7 g/dL (ref 13.0–17.0)
Lymphocytes Relative: 12.2 % (ref 12.0–46.0)
Lymphs Abs: 1 10*3/uL (ref 0.7–4.0)
MCHC: 33.2 g/dL (ref 30.0–36.0)
MCV: 88.4 fl (ref 78.0–100.0)
Monocytes Absolute: 0.6 10*3/uL (ref 0.1–1.0)
Monocytes Relative: 6.7 % (ref 3.0–12.0)
Neutro Abs: 6.6 10*3/uL (ref 1.4–7.7)
Neutrophils Relative %: 79.4 % — ABNORMAL HIGH (ref 43.0–77.0)
Platelets: 201 10*3/uL (ref 150.0–400.0)
RBC: 4.66 Mil/uL (ref 4.22–5.81)
RDW: 14 % (ref 11.5–15.5)
WBC: 8.3 10*3/uL (ref 4.0–10.5)

## 2021-06-07 LAB — PROTIME-INR
INR: 1 ratio (ref 0.8–1.0)
Prothrombin Time: 11 s (ref 9.6–13.1)

## 2021-06-07 LAB — COMPREHENSIVE METABOLIC PANEL
ALT: 17 U/L (ref 0–53)
AST: 18 U/L (ref 0–37)
Albumin: 4.2 g/dL (ref 3.5–5.2)
Alkaline Phosphatase: 85 U/L (ref 39–117)
BUN: 14 mg/dL (ref 6–23)
CO2: 28 mEq/L (ref 19–32)
Calcium: 9.3 mg/dL (ref 8.4–10.5)
Chloride: 99 mEq/L (ref 96–112)
Creatinine, Ser: 1.25 mg/dL (ref 0.40–1.50)
GFR: 56.64 mL/min — ABNORMAL LOW (ref >60.00)
Glucose, Bld: 85 mg/dL (ref 70–99)
Potassium: 4.4 mEq/L (ref 3.5–5.1)
Sodium: 137 mEq/L (ref 135–145)
Total Bilirubin: 1.1 mg/dL (ref 0.2–1.2)
Total Protein: 8.2 g/dL (ref 6.0–8.3)

## 2021-06-07 LAB — LIPID PANEL
Cholesterol: 167 mg/dL (ref 0–200)
HDL: 46.2 mg/dL (ref >39.00)
LDL Cholesterol: 102 mg/dL — ABNORMAL HIGH (ref 0–99)
NonHDL: 120.46
Total CHOL/HDL Ratio: 4
Triglycerides: 92 mg/dL (ref 0.0–149.0)
VLDL: 18.4 mg/dL (ref 0.0–40.0)

## 2021-06-07 LAB — PSA, MEDICARE: PSA: 1.16 ng/ml (ref 0.10–4.00)

## 2021-06-07 MED ORDER — AMLODIPINE BESYLATE 5 MG PO TABS
5.0000 mg | ORAL_TABLET | Freq: Every day | ORAL | 3 refills | Status: AC
  Filled 2021-06-07: qty 90, 90d supply, fill #0

## 2021-06-07 MED ORDER — TERBINAFINE HCL 250 MG PO TABS
250.0000 mg | ORAL_TABLET | Freq: Every day | ORAL | 0 refills | Status: DC
  Filled 2021-06-07: qty 14, 14d supply, fill #0

## 2021-06-07 NOTE — Progress Notes (Signed)
   Covid-19 Vaccination Clinic  Name:  JAZZ GALE    MRN: [id number] DOB: [birth year]  06/07/2021  Mr. Labra was observed post Covid-19 immunization for 15 minutes without incident. He was provided with Vaccine Information Sheet and instruction to access the V-Safe system.   Mr. Lundstrom was instructed to call 911 with any severe reactions post vaccine: Difficulty breathing  Swelling of face and throat  A fast heartbeat  A bad rash all over body  Dizziness and weakness

## 2021-06-09 ENCOUNTER — Other Ambulatory Visit (HOSPITAL_BASED_OUTPATIENT_CLINIC_OR_DEPARTMENT_OTHER): Payer: Self-pay

## 2021-06-09 DIAGNOSIS — M1711 Unilateral primary osteoarthritis, right knee: Secondary | ICD-10-CM | POA: Diagnosis not present

## 2021-06-09 MED ORDER — OXYCODONE HCL 5 MG PO TABS
ORAL_TABLET | ORAL | 0 refills | Status: DC
  Filled 2021-06-09: qty 30, 5d supply, fill #0

## 2021-06-09 MED ORDER — ASPIRIN EC 81 MG PO TBEC
DELAYED_RELEASE_TABLET | ORAL | 0 refills | Status: AC
  Filled 2021-06-09: qty 120, 30d supply, fill #0

## 2021-06-09 MED ORDER — TIZANIDINE HCL 2 MG PO TABS
ORAL_TABLET | ORAL | 0 refills | Status: DC
  Filled 2021-06-09: qty 60, 15d supply, fill #0

## 2021-06-09 MED ORDER — CELECOXIB 200 MG PO CAPS
200.0000 mg | ORAL_CAPSULE | Freq: Two times a day (BID) | ORAL | 0 refills | Status: AC
  Filled 2021-06-09: qty 60, 30d supply, fill #0

## 2021-06-09 MED ORDER — ONDANSETRON HCL 4 MG PO TABS
ORAL_TABLET | ORAL | 0 refills | Status: AC
  Filled 2021-06-09: qty 30, 8d supply, fill #0

## 2021-06-14 ENCOUNTER — Other Ambulatory Visit (HOSPITAL_BASED_OUTPATIENT_CLINIC_OR_DEPARTMENT_OTHER): Payer: Self-pay

## 2021-06-14 DIAGNOSIS — Z23 Encounter for immunization: Secondary | ICD-10-CM | POA: Diagnosis not present

## 2021-06-14 MED ORDER — COVID-19MRNA BIVAL VACC PFIZER 30 MCG/0.3ML IM SUSP
INTRAMUSCULAR | 0 refills | Status: DC
  Filled 2021-06-14: qty 0.3, 1d supply, fill #0

## 2021-06-18 DIAGNOSIS — G8918 Other acute postprocedural pain: Secondary | ICD-10-CM | POA: Diagnosis not present

## 2021-06-18 DIAGNOSIS — M21061 Valgus deformity, not elsewhere classified, right knee: Secondary | ICD-10-CM | POA: Diagnosis not present

## 2021-06-18 DIAGNOSIS — M1711 Unilateral primary osteoarthritis, right knee: Secondary | ICD-10-CM | POA: Diagnosis not present

## 2021-06-21 ENCOUNTER — Other Ambulatory Visit (HOSPITAL_BASED_OUTPATIENT_CLINIC_OR_DEPARTMENT_OTHER): Payer: Self-pay

## 2021-06-21 DIAGNOSIS — Z9889 Other specified postprocedural states: Secondary | ICD-10-CM | POA: Diagnosis not present

## 2021-06-21 DIAGNOSIS — Z471 Aftercare following joint replacement surgery: Secondary | ICD-10-CM | POA: Diagnosis not present

## 2021-06-21 DIAGNOSIS — Z96651 Presence of right artificial knee joint: Secondary | ICD-10-CM | POA: Diagnosis not present

## 2021-06-21 DIAGNOSIS — M6281 Muscle weakness (generalized): Secondary | ICD-10-CM | POA: Diagnosis not present

## 2021-06-21 DIAGNOSIS — R2689 Other abnormalities of gait and mobility: Secondary | ICD-10-CM | POA: Diagnosis not present

## 2021-06-21 MED ORDER — PROMETHAZINE HCL 25 MG PO TABS
25.0000 mg | ORAL_TABLET | Freq: Three times a day (TID) | ORAL | 0 refills | Status: DC | PRN
  Filled 2021-06-21: qty 30, 10d supply, fill #0

## 2021-06-21 MED ORDER — HYDROMORPHONE HCL 2 MG PO TABS
ORAL_TABLET | ORAL | 0 refills | Status: DC
  Filled 2021-06-21: qty 30, 6d supply, fill #0

## 2021-06-23 DIAGNOSIS — R2689 Other abnormalities of gait and mobility: Secondary | ICD-10-CM | POA: Diagnosis not present

## 2021-06-23 DIAGNOSIS — Z96651 Presence of right artificial knee joint: Secondary | ICD-10-CM | POA: Diagnosis not present

## 2021-06-23 DIAGNOSIS — Z9889 Other specified postprocedural states: Secondary | ICD-10-CM | POA: Diagnosis not present

## 2021-06-23 DIAGNOSIS — Z471 Aftercare following joint replacement surgery: Secondary | ICD-10-CM | POA: Diagnosis not present

## 2021-06-23 DIAGNOSIS — M6281 Muscle weakness (generalized): Secondary | ICD-10-CM | POA: Diagnosis not present

## 2021-06-28 DIAGNOSIS — Z96651 Presence of right artificial knee joint: Secondary | ICD-10-CM | POA: Diagnosis not present

## 2021-06-28 DIAGNOSIS — Z471 Aftercare following joint replacement surgery: Secondary | ICD-10-CM | POA: Diagnosis not present

## 2021-06-28 DIAGNOSIS — M6281 Muscle weakness (generalized): Secondary | ICD-10-CM | POA: Diagnosis not present

## 2021-06-28 DIAGNOSIS — R2689 Other abnormalities of gait and mobility: Secondary | ICD-10-CM | POA: Diagnosis not present

## 2021-06-28 DIAGNOSIS — Z9889 Other specified postprocedural states: Secondary | ICD-10-CM | POA: Diagnosis not present

## 2021-06-30 ENCOUNTER — Other Ambulatory Visit (HOSPITAL_BASED_OUTPATIENT_CLINIC_OR_DEPARTMENT_OTHER): Payer: Self-pay

## 2021-06-30 DIAGNOSIS — Z96651 Presence of right artificial knee joint: Secondary | ICD-10-CM | POA: Diagnosis not present

## 2021-06-30 DIAGNOSIS — Z471 Aftercare following joint replacement surgery: Secondary | ICD-10-CM | POA: Diagnosis not present

## 2021-06-30 MED ORDER — TERBINAFINE HCL 250 MG PO TABS
250.0000 mg | ORAL_TABLET | Freq: Every day | ORAL | 0 refills | Status: DC
  Filled 2021-06-30: qty 14, 14d supply, fill #0

## 2021-06-30 MED ORDER — HYDROMORPHONE HCL 2 MG PO TABS
ORAL_TABLET | ORAL | 0 refills | Status: DC
  Filled 2021-06-30: qty 30, 5d supply, fill #0

## 2021-06-30 NOTE — Addendum Note (Signed)
Addended by: Lamar Blinks C on: 06/30/2021 10:01 AM   Modules accepted: Orders

## 2021-07-02 DIAGNOSIS — Z9889 Other specified postprocedural states: Secondary | ICD-10-CM | POA: Diagnosis not present

## 2021-07-02 DIAGNOSIS — Z96651 Presence of right artificial knee joint: Secondary | ICD-10-CM | POA: Diagnosis not present

## 2021-07-02 DIAGNOSIS — M6281 Muscle weakness (generalized): Secondary | ICD-10-CM | POA: Diagnosis not present

## 2021-07-02 DIAGNOSIS — R2689 Other abnormalities of gait and mobility: Secondary | ICD-10-CM | POA: Diagnosis not present

## 2021-07-02 DIAGNOSIS — Z471 Aftercare following joint replacement surgery: Secondary | ICD-10-CM | POA: Diagnosis not present

## 2021-07-07 DIAGNOSIS — Z96651 Presence of right artificial knee joint: Secondary | ICD-10-CM | POA: Diagnosis not present

## 2021-07-07 DIAGNOSIS — M6281 Muscle weakness (generalized): Secondary | ICD-10-CM | POA: Diagnosis not present

## 2021-07-07 DIAGNOSIS — R2689 Other abnormalities of gait and mobility: Secondary | ICD-10-CM | POA: Diagnosis not present

## 2021-07-07 DIAGNOSIS — Z471 Aftercare following joint replacement surgery: Secondary | ICD-10-CM | POA: Diagnosis not present

## 2021-07-07 DIAGNOSIS — M2689 Other dentofacial anomalies: Secondary | ICD-10-CM | POA: Diagnosis not present

## 2021-07-07 DIAGNOSIS — Z9889 Other specified postprocedural states: Secondary | ICD-10-CM | POA: Diagnosis not present

## 2021-07-14 DIAGNOSIS — R2689 Other abnormalities of gait and mobility: Secondary | ICD-10-CM | POA: Diagnosis not present

## 2021-07-14 DIAGNOSIS — Z471 Aftercare following joint replacement surgery: Secondary | ICD-10-CM | POA: Diagnosis not present

## 2021-07-14 DIAGNOSIS — M6281 Muscle weakness (generalized): Secondary | ICD-10-CM | POA: Diagnosis not present

## 2021-07-14 DIAGNOSIS — Z9889 Other specified postprocedural states: Secondary | ICD-10-CM | POA: Diagnosis not present

## 2021-07-14 DIAGNOSIS — Z96651 Presence of right artificial knee joint: Secondary | ICD-10-CM | POA: Diagnosis not present

## 2021-07-19 DIAGNOSIS — Z471 Aftercare following joint replacement surgery: Secondary | ICD-10-CM | POA: Diagnosis not present

## 2021-07-19 DIAGNOSIS — Z9889 Other specified postprocedural states: Secondary | ICD-10-CM | POA: Diagnosis not present

## 2021-07-19 DIAGNOSIS — Z96651 Presence of right artificial knee joint: Secondary | ICD-10-CM | POA: Diagnosis not present

## 2021-07-19 DIAGNOSIS — M6281 Muscle weakness (generalized): Secondary | ICD-10-CM | POA: Diagnosis not present

## 2021-07-19 DIAGNOSIS — R2689 Other abnormalities of gait and mobility: Secondary | ICD-10-CM | POA: Diagnosis not present

## 2021-07-20 DIAGNOSIS — Z471 Aftercare following joint replacement surgery: Secondary | ICD-10-CM | POA: Diagnosis not present

## 2021-07-20 DIAGNOSIS — Z96651 Presence of right artificial knee joint: Secondary | ICD-10-CM | POA: Diagnosis not present

## 2021-07-20 DIAGNOSIS — R2689 Other abnormalities of gait and mobility: Secondary | ICD-10-CM | POA: Diagnosis not present

## 2021-07-20 DIAGNOSIS — M6281 Muscle weakness (generalized): Secondary | ICD-10-CM | POA: Diagnosis not present

## 2021-07-20 DIAGNOSIS — Z9889 Other specified postprocedural states: Secondary | ICD-10-CM | POA: Diagnosis not present

## 2021-07-21 DIAGNOSIS — Z96651 Presence of right artificial knee joint: Secondary | ICD-10-CM | POA: Diagnosis not present

## 2021-07-21 DIAGNOSIS — M6281 Muscle weakness (generalized): Secondary | ICD-10-CM | POA: Diagnosis not present

## 2021-07-21 DIAGNOSIS — R2689 Other abnormalities of gait and mobility: Secondary | ICD-10-CM | POA: Diagnosis not present

## 2021-07-21 DIAGNOSIS — Z9889 Other specified postprocedural states: Secondary | ICD-10-CM | POA: Diagnosis not present

## 2021-07-21 DIAGNOSIS — Z471 Aftercare following joint replacement surgery: Secondary | ICD-10-CM | POA: Diagnosis not present

## 2021-07-22 DIAGNOSIS — M9901 Segmental and somatic dysfunction of cervical region: Secondary | ICD-10-CM | POA: Diagnosis not present

## 2021-07-22 DIAGNOSIS — M5411 Radiculopathy, occipito-atlanto-axial region: Secondary | ICD-10-CM | POA: Diagnosis not present

## 2021-07-22 NOTE — Addendum Note (Signed)
Addended by: Lamar Blinks C on: 07/22/2021 01:34 PM   Modules accepted: Orders

## 2021-07-26 ENCOUNTER — Ambulatory Visit (INDEPENDENT_AMBULATORY_CARE_PROVIDER_SITE_OTHER): Payer: Medicare Other | Admitting: Family Medicine

## 2021-07-26 ENCOUNTER — Other Ambulatory Visit (HOSPITAL_BASED_OUTPATIENT_CLINIC_OR_DEPARTMENT_OTHER): Payer: Self-pay

## 2021-07-26 ENCOUNTER — Encounter: Payer: Self-pay | Admitting: Family Medicine

## 2021-07-26 ENCOUNTER — Other Ambulatory Visit: Payer: Self-pay

## 2021-07-26 VITALS — BP 135/90 | HR 90 | Temp 98.2°F | Resp 16 | Wt 218.0 lb

## 2021-07-26 DIAGNOSIS — K4090 Unilateral inguinal hernia, without obstruction or gangrene, not specified as recurrent: Secondary | ICD-10-CM | POA: Diagnosis not present

## 2021-07-26 DIAGNOSIS — Z96651 Presence of right artificial knee joint: Secondary | ICD-10-CM | POA: Diagnosis not present

## 2021-07-26 DIAGNOSIS — R21 Rash and other nonspecific skin eruption: Secondary | ICD-10-CM

## 2021-07-26 DIAGNOSIS — Z9889 Other specified postprocedural states: Secondary | ICD-10-CM | POA: Diagnosis not present

## 2021-07-26 DIAGNOSIS — M6281 Muscle weakness (generalized): Secondary | ICD-10-CM | POA: Diagnosis not present

## 2021-07-26 DIAGNOSIS — R2689 Other abnormalities of gait and mobility: Secondary | ICD-10-CM | POA: Diagnosis not present

## 2021-07-26 DIAGNOSIS — Z471 Aftercare following joint replacement surgery: Secondary | ICD-10-CM | POA: Diagnosis not present

## 2021-07-26 MED ORDER — TRIAMCINOLONE ACETONIDE 0.5 % EX CREA
1.0000 "application " | TOPICAL_CREAM | Freq: Two times a day (BID) | CUTANEOUS | 1 refills | Status: AC
  Filled 2021-07-26: qty 30, 15d supply, fill #0

## 2021-07-26 NOTE — Patient Instructions (Signed)
Good to see you today!  Please try the cream as needed on your rash spots- up to twice a day Please call [facility] and make an appt to be seen  I think you may have psoriasis

## 2021-07-26 NOTE — Progress Notes (Signed)
[place] at [facility] [address], [place], [place] [address] [phone]  Date:  07/26/2021   Name:  Chris Welch   DOB:  09/12/1946   MRN:  [id number]  PCP:  Darreld Mclean, MD    Chief Complaint: No chief complaint on file.   History of Present Illness:  Chris Welch is a 75 y.o. 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  We treated with oral terbinafine but it did not improve.  He now has new areas of rash on her right knee and LOWER back and buttocks Not itchy or sore He otherwise feels well  He has not had this in the past  He is using some sort of cream that he got from the [place] which does seem to be helping, he is not sure what this is  He had a right knee replacement on 9/30-this went well, he is walking with no assistance Patient Active Problem List   Diagnosis Date Noted   Degenerative joint disease (DJD) of hip 01/05/2016   Atypical chest pain 04/22/2014   Gastric reflux with aspiration 03/04/2014   Carpal tunnel syndrome 11/26/2013   HTN (hypertension) 11/26/2013   AR (allergic rhinitis) 11/26/2013   Low sodium levels 03/13/2013   BPH associated with nocturia 08/21/2012   Hyperlipidemia 08/21/2012   Recurrent aspiration bronchitis/pneumonia 10/05/2011   Diverticulosis 10/05/2011   Hearing loss of both ears 10/05/2011   Depression 10/05/2011   Vocal cord paralysis 01/17/2007    Past Medical History:   Diagnosis Date   Adenomatous colon polyp 2009   Anemia    Anxiety    Aortic atherosclerosis ([place])    Arthritis    "hips, knees" (1/93/7902)   Complication of anesthesia    bad dreams-OCC PASSES OUT AFTER ANETHESIA   Depression    Diverticulitis    Diverticulosis    Ear infection ear infection lasting past 2 months, still ongoing, pt on antibiotics & drops. 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   Right Cervical Paraganglioma   Vocal cord paralysis    Right    Past Surgical History:  Procedure Laterality Date   APPENDECTOMY     BACK SURGERY     LOWER   CAROTID BODY TUMOR EXCISION  CERVICAL PARAGANGLIOMA EXCISION Right 12/2006   CHOLECYSTECTOMY OPEN     CYST EXCISION Left 12/17/2019   Procedure: CYST EXCISION; EXCISION NAIL HORN REMNANT LEFT RING FINGER;  Surgeon: Daryll Brod, MD;  Location: [facility];  Service: Orthopedics;  Laterality: Left;  IV REGIONAL   CYSTOSCOPY WITH INSERTION OF UROLIFT N/A 10/12/2020   Procedure: CYSTOSCOPY WITH INSERTION OF UROLIFT;  Surgeon: Hollice Espy, MD;  Location: [facility];  Service: Urology;  Laterality: N/A;   DEEP NECK LYMPH NODE BIOPSY / EXCISION     DIGIT NAIL REMOVAL Left 07/30/2019   Procedure: REMOVAL OF NAIL HORNS LEFT RING FINGER;  Surgeon: Daryll Brod, MD;  Location: [facility];  Service: Orthopedics;  Laterality: Left;  IV REGIONAL FOREARM BLOCK   ESOPHAGOGASTRODUODENOSCOPY (EGD) WITH  ESOPHAGEAL DILATION     INGUINAL HERNIA REPAIR Right 1954   JOINT REPLACEMENT     KNEE ARTHROSCOPY Right    LUMBAR LAMINECTOMY/DECOMPRESSION MICRODISCECTOMY Left 03/09/2013   Procedure: LUMBAR LAMINECTOMY/DECOMPRESSION MICRODISCECTOMY 1 LEVEL;  Surgeon: Eustace Moore, MD;  Location: [facility];  Service: Neurosurgery;  Laterality: Left;  Left lumbar three-four extra-foraminal microdiscectomy   PATELLA ARTHROPLASTY Right    has had 5 surgeries right knee   TOTAL HIP ARTHROPLASTY Left 01/27/2016   TOTAL HIP ARTHROPLASTY Left 01/27/2016   Procedure: TOTAL HIP ARTHROPLASTY ANTERIOR APPROACH;  Surgeon: Frederik Pear, MD;  Location: [facility];  Service: Orthopedics;  Laterality: Left;   UPPER GASTROINTESTINAL ENDOSCOPY     VOCAL CORD IMPLANT      Social History   Tobacco Use   Smoking status: Never   Smokeless tobacco: Never  Vaping Use   Vaping Use: Never used  Substance Use Topics   Alcohol use: Yes    Alcohol/week: 3.0 standard drinks    Types: 3 Cans of beer per week    Comment: social 2 per week   Drug use: No    Family History  Problem Relation Age of Onset   Arthritis Brother        back   Emphysema Brother    Cancer Brother        rare cancer-unknown type   Heart disease Mother    Emphysema Father        smoker   Colon cancer Neg Hx    Esophageal cancer Neg Hx    Rectal cancer Neg Hx    Stomach cancer Neg Hx     Allergies  Allergen Reactions   Hydrocodone Nausea And Vomiting    "can not take on an empty stomach"   Penicillins Other (See Comments) and Rash    Ulcers on eyes    Medication list has been reviewed and updated.  Current Outpatient Medications on File Prior to Visit  Medication Sig Dispense Refill   albuterol (VENTOLIN HFA) 108 (90 Base) MCG/ACT inhaler Inhale 2 puffs into the lungs every 6 (six) hours as needed for wheezing or shortness of breath. 8 g 0   amLODipine (NORVASC) 5 MG tablet Take 1 tablet (5 mg total) by mouth daily. 90 tablet 3   aspirin  EC 81 MG tablet Take 81 mg by mouth daily.     aspirin EC 81 MG tablet Take 1 tablet by mouth twice daily for 2 weeks and then once daily for 2 weeks 120 tablet 0   benzonatate (TESSALON) 100 MG capsule Take 1 capsule (100 mg total) by mouth 3 (three) times daily as needed. 30 capsule 0   buPROPion [facility]  XL) 150 MG 24 hr tablet Take 3 tablets (450 mg total) by mouth daily. (Patient taking differently: Take 450 mg by mouth in the morning, at noon, and at bedtime.) 270 tablet 3   celecoxib (CELEBREX) 200 MG capsule Take 1 capsule by mouth twice a day 60 capsule 0   COVID-19 mRNA bivalent vaccine, Pfizer, injection Inject into the muscle. 0.3 mL 0   COVID-19 mRNA vaccine, Pfizer, 30 MCG/0.3ML injection INJECT AS DIRECTED .3 mL 0   ferrous sulfate 325 (65 FE) MG tablet Take 325 mg by mouth every Monday, Wednesday, and Friday.     finasteride (PROSCAR) 5 MG tablet Take 1 tablet (5 mg total) by mouth daily. 1 tablet every other day 30 tablet 0   fluticasone (FLONASE) 50 MCG/ACT nasal spray Place 2 sprays into both nostrils daily. (Patient taking differently: Place 2 sprays into both nostrils daily as needed for allergies.) 48 g 4   glycopyrrolate (ROBINUL) 1 MG tablet Take 1 tablet (1 mg total) by mouth 3 (three) times daily. (Patient taking differently: Take 1 mg by mouth every morning. AND AT BEDTIME PRN) 270 tablet 3   HYDROmorphone (DILAUDID) 2 MG tablet Take 1 tablet by mouth every four hours as needed for pain 30 tablet 0   losartan (COZAAR) 100 MG tablet Take 100 mg by mouth every morning.     omeprazole (PRILOSEC) 20 MG capsule Take 20 mg by mouth every morning.     ondansetron (ZOFRAN) 4 MG tablet Take 1 tablet by mouth every six to eight hours as needed 30 tablet 0   oxyCODONE (OXY IR/ROXICODONE) 5 MG immediate release tablet Take 1 tablet by mouth every four hours as needed for pain 30 tablet 0   pravastatin (PRAVACHOL) 20 MG tablet Take 1 tablet (20 mg total) by mouth daily. (Patient  taking differently: Take 20 mg by mouth every morning.) 90 tablet 3   promethazine (PHENERGAN) 25 MG tablet Take 1 tablet by mouth three times a day as needed 30 tablet 0   terbinafine (LAMISIL) 250 MG tablet Take 1 tablet (250 mg total) by mouth daily. 14 tablet 0   terbinafine (LAMISIL) 250 MG tablet Take 1 tablet (250 mg total) by mouth daily. 14 tablet 0   tiZANidine (ZANAFLEX) 2 MG tablet Take 1 tablet by mouth every 6 to 8 hours 60 tablet 0   No current facility-administered medications on file prior to visit.    Review of Systems:  As per HPI- otherwise negative.   Physical Examination: Vitals:   07/26/21 1133 07/26/21 1154  BP: (!) 152/95 135/90  Pulse: (!) 102 90  Resp: 16   Temp: 98.2 F (36.8 C)   SpO2: 100%    Vitals:   07/26/21 1133  Weight: 218 lb (98.9 kg)   Body mass index is 27.25 kg/m. Ideal Body Weight:    GEN: no acute distress.  Overweight, looks well HEENT: Atraumatic, Normocephalic.  Ears and Nose: No external deformity. CV: RRR, No M/G/R. No JVD. No thrill. No extra heart sounds. PULM: CTA B, no wheezes, crackles, rhonchi. No retractions. No resp. distress. No accessory muscle use. EXTR: No c/c/e PSYCH: Normally interactive. Conversant.  He has discrete patches and macules have a silvery scaled skin rash on his right knee and on his lower back-appear potentially consistent with psoriasis Yes has a left inguinal hernia which is reducible.  Patient notes it sometimes is uncomfortable, when it causes discomfort he will push it back in with his hand and  feels better       Assessment and Plan: Rash - Plan: triamcinolone cream (KENALOG) 0.5 %  Left inguinal hernia  Patient seen today with concerns of above.  I have already placed referral to general surgery for treatment today pneumonia.  I have asked him to seek emergency care if it should become acutely or persistently painful/unable to reduce  Skin rash, appears most consistent with psoriasis  at the onset of this age would be somewhat unusual.  Patient does have dermatology care, he agrees to call his dermatologist and schedule an appointment.  In the meantime we will try triamcinolone 0.5 twice daily  Signed Lamar Blinks, MD

## 2021-07-27 ENCOUNTER — Other Ambulatory Visit (HOSPITAL_BASED_OUTPATIENT_CLINIC_OR_DEPARTMENT_OTHER): Payer: Self-pay

## 2021-07-28 DIAGNOSIS — M5411 Radiculopathy, occipito-atlanto-axial region: Secondary | ICD-10-CM | POA: Diagnosis not present

## 2021-07-28 DIAGNOSIS — M9901 Segmental and somatic dysfunction of cervical region: Secondary | ICD-10-CM | POA: Diagnosis not present

## 2021-08-03 DIAGNOSIS — M5411 Radiculopathy, occipito-atlanto-axial region: Secondary | ICD-10-CM | POA: Diagnosis not present

## 2021-08-03 DIAGNOSIS — M9901 Segmental and somatic dysfunction of cervical region: Secondary | ICD-10-CM | POA: Diagnosis not present

## 2021-08-05 ENCOUNTER — Other Ambulatory Visit (HOSPITAL_BASED_OUTPATIENT_CLINIC_OR_DEPARTMENT_OTHER): Payer: Self-pay

## 2021-08-05 DIAGNOSIS — L400 Psoriasis vulgaris: Secondary | ICD-10-CM | POA: Diagnosis not present

## 2021-08-05 DIAGNOSIS — L821 Other seborrheic keratosis: Secondary | ICD-10-CM | POA: Diagnosis not present

## 2021-08-05 DIAGNOSIS — Z85828 Personal history of other malignant neoplasm of skin: Secondary | ICD-10-CM | POA: Diagnosis not present

## 2021-08-05 DIAGNOSIS — R21 Rash and other nonspecific skin eruption: Secondary | ICD-10-CM | POA: Diagnosis not present

## 2021-08-05 DIAGNOSIS — L308 Other specified dermatitis: Secondary | ICD-10-CM | POA: Diagnosis not present

## 2021-08-05 DIAGNOSIS — B029 Zoster without complications: Secondary | ICD-10-CM | POA: Diagnosis not present

## 2021-08-05 DIAGNOSIS — L570 Actinic keratosis: Secondary | ICD-10-CM | POA: Diagnosis not present

## 2021-08-05 DIAGNOSIS — L814 Other melanin hyperpigmentation: Secondary | ICD-10-CM | POA: Diagnosis not present

## 2021-08-05 MED ORDER — TRIAMCINOLONE ACETONIDE 0.1 % EX CREA
TOPICAL_CREAM | CUTANEOUS | 0 refills | Status: DC
  Filled 2021-08-05: qty 454, 30d supply, fill #0

## 2021-08-05 MED ORDER — VALACYCLOVIR HCL 1 G PO TABS
1000.0000 mg | ORAL_TABLET | Freq: Three times a day (TID) | ORAL | 0 refills | Status: DC
  Filled 2021-08-05: qty 21, 7d supply, fill #0

## 2021-08-09 DIAGNOSIS — M5411 Radiculopathy, occipito-atlanto-axial region: Secondary | ICD-10-CM | POA: Diagnosis not present

## 2021-08-09 DIAGNOSIS — M9901 Segmental and somatic dysfunction of cervical region: Secondary | ICD-10-CM | POA: Diagnosis not present

## 2021-08-18 ENCOUNTER — Other Ambulatory Visit (HOSPITAL_BASED_OUTPATIENT_CLINIC_OR_DEPARTMENT_OTHER): Payer: Self-pay

## 2021-08-19 ENCOUNTER — Other Ambulatory Visit (HOSPITAL_BASED_OUTPATIENT_CLINIC_OR_DEPARTMENT_OTHER): Payer: Self-pay

## 2021-08-19 MED ORDER — TRIAMCINOLONE ACETONIDE 0.1 % EX CREA
TOPICAL_CREAM | CUTANEOUS | 0 refills | Status: AC
  Filled 2021-08-19: qty 454, 30d supply, fill #0

## 2021-08-26 DIAGNOSIS — Z85828 Personal history of other malignant neoplasm of skin: Secondary | ICD-10-CM | POA: Diagnosis not present

## 2021-08-26 DIAGNOSIS — L308 Other specified dermatitis: Secondary | ICD-10-CM | POA: Diagnosis not present

## 2021-09-08 ENCOUNTER — Encounter: Payer: Self-pay | Admitting: Family Medicine

## 2021-09-08 DIAGNOSIS — J440 Chronic obstructive pulmonary disease with acute lower respiratory infection: Secondary | ICD-10-CM

## 2021-09-08 DIAGNOSIS — J40 Bronchitis, not specified as acute or chronic: Secondary | ICD-10-CM

## 2021-09-08 MED ORDER — DOXYCYCLINE HYCLATE 100 MG PO CAPS: 100.0000 mg | ORAL_CAPSULE | Freq: Two times a day (BID) | ORAL | 0 refills | Status: DC

## 2021-09-28 ENCOUNTER — Ambulatory Visit: Payer: Self-pay | Admitting: Surgery

## 2021-09-28 DIAGNOSIS — K4090 Unilateral inguinal hernia, without obstruction or gangrene, not specified as recurrent: Secondary | ICD-10-CM | POA: Diagnosis not present

## 2021-09-28 NOTE — H&P (View-Only) (Signed)
History of Present Illness: Chris Welch is a 76 y.o. male who was referred to me for evaluation of an inguinal hernia. He first noticed a bulge in the left groin about a year ago and says it has been gradually enlarging. He notices it more when he coughs and is playing golf. It usually reduces in the supine position and he denies obstructive symptoms. He has not noticed any bulge on the right side.   Prior abdominal surgeries include an open cholecystectomy in the 1980s and a right inguinal hernia repair in early childhood.     Review of Systems: A complete review of systems was obtained from the patient.  I have reviewed this information and discussed as appropriate with the patient.  See HPI as well for other ROS.     Medical History: Past Medical History Past Medical History: Diagnosis Date  Anemia    GERD (gastroesophageal reflux disease)    Hypertension        Patient Active Problem List Diagnosis  Inguinal hernia, left     Past Surgical History Past Surgical History: Procedure Laterality Date  EXCISION CAROTID BODY TUMOR Right 2008   Has vocal cord paralysis  CHOLECYSTECTOMY OPEN       1980s  CYSTOSCOPY      INGUINAL HERNIA REPAIR Right     Age 24  JOINT REPLACEMENT          Allergies Allergies Allergen Reactions  Penicillins Other (See Comments), Rash, Swelling and Unknown     Ulcers on eyes Ulcers on eyes        Current Outpatient Medications on File Prior to Visit Medication Sig Dispense Refill  aspirin 81 MG EC tablet Take by mouth      finasteride (PROSCAR) 5 mg tablet Take by mouth      buPROPion (WELLBUTRIN XL) 150 MG XL tablet Take by mouth      glycopyrrolate (ROBINUL) 1 mg tablet Take by mouth      losartan (COZAAR) 100 MG tablet Take 100 mg by mouth every morning       No current facility-administered medications on file prior to visit.     Family History History reviewed. No pertinent family history.     Social History    Tobacco Use Smoking Status Never Smokeless Tobacco Never     Social History Social History    Socioeconomic History  Marital status: Married Tobacco Use  Smoking status: Never  Smokeless tobacco: Never      Objective:     Vitals:   09/28/21 0911 BP: 136/80 Pulse: 84 Temp: 36.4 C (97.6 F) SpO2: 99% Weight: (!) 100.8 kg (222 lb 3.2 oz) Height: 190.5 cm (6\' 3" )   Body mass index is 27.77 kg/m.   Physical Exam Vitals reviewed.  Constitutional:      General: He is not in acute distress.    Appearance: Normal appearance.  HENT:     Head: Normocephalic and atraumatic.  Eyes:     General: No scleral icterus.    Conjunctiva/sclera: Conjunctivae normal.  Neck:     Comments: Well-healed scar right neck. Cardiovascular:     Rate and Rhythm: Normal rate and regular rhythm.     Heart sounds: No murmur heard. Pulmonary:     Effort: Pulmonary effort is normal. No respiratory distress.     Breath sounds: Normal breath sounds. No wheezing.  Abdominal:     General: There is no distension.     Palpations: Abdomen is soft.  Tenderness: There is no abdominal tenderness.     Comments: Well-healed right subcostal scar.  Genitourinary:    Comments: Large left inguinal hernia, soft and reducible. No appreciable right inguinal hernia. Musculoskeletal:        General: No deformity. Normal range of motion.     Cervical back: Normal range of motion.  Skin:    General: Skin is warm and dry.     Coloration: Skin is not jaundiced.  Neurological:     General: No focal deficit present.     Mental Status: He is alert and oriented to person, place, and time.     Motor: No weakness.  Psychiatric:        Mood and Affect: Mood normal.        Behavior: Behavior normal.        Thought Content: Thought content normal.            Assessment and Plan: Diagnoses and all orders for this visit:   Inguinal hernia, left       This is a 76 yo male with a reducible but  symptomatic left inguinal hernia. I recommended repair via an open approach with mesh. I discussed the details of the surgery with the patient including the postoperative activity restrictions and he agrees to proceed. Hernia pamphlet provided. He will be contacted to schedule an elective outpatient surgery date.  Michaelle Birks, MD [facility] Surgery General, Hepatobiliary and Pancreatic Surgery 09/28/21 9:41 AM

## 2021-09-28 NOTE — H&P (Signed)
History of Present Illness: Chris Welch is a 76 y.o. male who was referred to me for evaluation of an inguinal hernia. He first noticed a bulge in the left groin about a year ago and says it has been gradually enlarging. He notices it more when he coughs and is playing golf. It usually reduces in the supine position and he denies obstructive symptoms. He has not noticed any bulge on the right side.   Prior abdominal surgeries include an open cholecystectomy in the 1980s and a right inguinal hernia repair in early childhood.     Review of Systems: A complete review of systems was obtained from the patient.  I have reviewed this information and discussed as appropriate with the patient.  See HPI as well for other ROS.     Medical History: Past Medical History Past Medical History: Diagnosis Date  Anemia    GERD (gastroesophageal reflux disease)    Hypertension        Patient Active Problem List Diagnosis  Inguinal hernia, left     Past Surgical History Past Surgical History: Procedure Laterality Date  EXCISION CAROTID BODY TUMOR Right 2008   Has vocal cord paralysis  CHOLECYSTECTOMY OPEN       1980s  CYSTOSCOPY      INGUINAL HERNIA REPAIR Right     Age 62  JOINT REPLACEMENT          Allergies Allergies Allergen Reactions  Penicillins Other (See Comments), Rash, Swelling and Unknown     Ulcers on eyes Ulcers on eyes        Current Outpatient Medications on File Prior to Visit Medication Sig Dispense Refill  aspirin 81 MG EC tablet Take by mouth      finasteride (PROSCAR) 5 mg tablet Take by mouth      buPROPion (WELLBUTRIN XL) 150 MG XL tablet Take by mouth      glycopyrrolate (ROBINUL) 1 mg tablet Take by mouth      losartan (COZAAR) 100 MG tablet Take 100 mg by mouth every morning       No current facility-administered medications on file prior to visit.     Family History History reviewed. No pertinent family history.     Social History    Tobacco Use Smoking Status Never Smokeless Tobacco Never     Social History Social History    Socioeconomic History  Marital status: Married Tobacco Use  Smoking status: Never  Smokeless tobacco: Never      Objective:     Vitals:   09/28/21 0911 BP: 136/80 Pulse: 84 Temp: 36.4 C (97.6 F) SpO2: 99% Weight: (!) 100.8 kg (222 lb 3.2 oz) Height: 190.5 cm (6\' 3" )   Body mass index is 27.77 kg/m.   Physical Exam Vitals reviewed.  Constitutional:      General: He is not in acute distress.    Appearance: Normal appearance.  HENT:     Head: Normocephalic and atraumatic.  Eyes:     General: No scleral icterus.    Conjunctiva/sclera: Conjunctivae normal.  Neck:     Comments: Well-healed scar right neck. Cardiovascular:     Rate and Rhythm: Normal rate and regular rhythm.     Heart sounds: No murmur heard. Pulmonary:     Effort: Pulmonary effort is normal. No respiratory distress.     Breath sounds: Normal breath sounds. No wheezing.  Abdominal:     General: There is no distension.     Palpations: Abdomen is soft.  Tenderness: There is no abdominal tenderness.     Comments: Well-healed right subcostal scar.  Genitourinary:    Comments: Large left inguinal hernia, soft and reducible. No appreciable right inguinal hernia. Musculoskeletal:        General: No deformity. Normal range of motion.     Cervical back: Normal range of motion.  Skin:    General: Skin is warm and dry.     Coloration: Skin is not jaundiced.  Neurological:     General: No focal deficit present.     Mental Status: He is alert and oriented to person, place, and time.     Motor: No weakness.  Psychiatric:        Mood and Affect: Mood normal.        Behavior: Behavior normal.        Thought Content: Thought content normal.            Assessment and Plan: Diagnoses and all orders for this visit:   Inguinal hernia, left       This is a 76 yo male with a reducible but  symptomatic left inguinal hernia. I recommended repair via an open approach with mesh. I discussed the details of the surgery with the patient including the postoperative activity restrictions and he agrees to proceed. Hernia pamphlet provided. He will be contacted to schedule an elective outpatient surgery date.  Michaelle Birks, MD [facility] Surgery General, Hepatobiliary and Pancreatic Surgery 09/28/21 9:41 AM

## 2021-10-11 NOTE — Pre-Procedure Instructions (Signed)
Surgical Instructions    Your procedure is scheduled on Friday, January 27th.  Report to [facility] Main Entrance "A" at 5:30 A.M., then check in with the Admitting office.  Call this number if you have problems the morning of surgery:  [phone]   If you have any questions prior to your surgery date call [phone]: Open Monday-Friday 8am-4pm    Remember:  Do not eat or drink after midnight the night before your surgery   Take these medicines the morning of surgery with A SIP OF WATER  amLODipine (NORVASC) buPROPion (WELLBUTRIN XL)  finasteride (PROSCAR) omeprazole (PRILOSEC)  pravastatin (PRAVACHOL)  glycopyrrolate (ROBINUL)  As needed: Albuterol inhaler-please bring with you to the hospital. fluticasone (FLONASE)  Follow your surgeon's instructions on when to stop Aspirin.  If no instructions were given by your surgeon then you will need to call the office to get those instructions.    As of today, STOP taking any Aleve, Naproxen, Ibuprofen, Motrin, Advil, Goody's, BC's, all herbal medications, fish oil, and all vitamins.                     Do NOT Smoke (Tobacco/Vaping) for 24 hours prior to your procedure.  If you use a CPAP at night, you may bring your mask/headgear for your overnight stay.   Contacts, glasses, piercing's, hearing aid's, dentures or partials may not be worn into surgery, please bring cases for these belongings.    For patients admitted to the hospital, discharge time will be determined by your treatment team.   Patients discharged the day of surgery will not be allowed to drive home, and someone needs to stay with them for 24 hours.  NO VISITORS WILL BE ALLOWED IN PRE-OP WHERE PATIENTS ARE PREPPED FOR SURGERY.  ONLY 1 SUPPORT PERSON MAY BE PRESENT IN THE WAITING ROOM WHILE YOU ARE IN SURGERY.  IF YOU ARE TO BE ADMITTED, ONCE YOU ARE IN YOUR ROOM YOU WILL BE ALLOWED TWO (2) VISITORS. (1) VISITOR MAY STAY OVERNIGHT BUT MUST ARRIVE TO THE ROOM BY 8pm.   Minor children may have two parents present. Special consideration for safety and communication needs will be reviewed on a case by case basis.   Special instructions:   [place]- Preparing For Surgery  Before surgery, you can play an important role. Because skin is not sterile, your skin needs to be as free of germs as possible. You can reduce the number of germs on your skin by washing with CHG (chlorahexidine gluconate) Soap before surgery.  CHG is an antiseptic cleaner which kills germs and bonds with the skin to continue killing germs even after washing.    Oral Hygiene is also important to reduce your risk of infection.  Remember - BRUSH YOUR TEETH THE MORNING OF SURGERY WITH YOUR REGULAR TOOTHPASTE  Please do not use if you have an allergy to CHG or antibacterial soaps. If your skin becomes reddened/irritated stop using the CHG.  Do not shave (including legs and underarms) for at least 48 hours prior to first CHG shower. It is OK to shave your face.  Please follow these instructions carefully.   Shower the NIGHT BEFORE SURGERY and the MORNING OF SURGERY  If you chose to wash your hair, wash your hair first as usual with your normal shampoo.  After you shampoo, rinse your hair and body thoroughly to remove the shampoo.  Use CHG Soap as you would any other liquid soap. You can apply CHG directly to  the skin and wash gently with a scrungie or a clean washcloth.   Apply the CHG Soap to your body ONLY FROM THE NECK DOWN.  Do not use on open wounds or open sores. Avoid contact with your eyes, ears, mouth and genitals (private parts). Wash Face and genitals (private parts)  with your normal soap.   Wash thoroughly, paying special attention to the area where your surgery will be performed.  Thoroughly rinse your body with warm water from the neck down.  DO NOT shower/wash with your normal soap after using and rinsing off the CHG Soap.  Pat yourself dry with a CLEAN TOWEL.  Wear  CLEAN PAJAMAS to bed the night before surgery  Place CLEAN SHEETS on your bed the night before your surgery  DO NOT SLEEP WITH PETS.   Day of Surgery: Shower with CHG soap. Do not wear jewelry. Do not wear lotions, powders, colognes, or deodorant. Men may shave face and neck. Do not bring valuables to the hospital. [facility] is not responsible for any belongings or valuables. Wear Clean/Comfortable clothing the morning of surgery Remember to brush your teeth WITH YOUR REGULAR TOOTHPASTE.   Please read over the following fact sheets that you were given.   3 days prior to your procedure or After your COVID test   You are not required to quarantine however you are required to wear a well-fitting mask when you are out and around people not in your household. If your mask becomes wet or soiled, replace with a new one.   Wash your hands often with soap and water for 20 seconds or clean your hands with an alcohol-based hand sanitizer that contains at least 60% alcohol.   Do not share personal items.   Notify your provider:  o if you are in close contact with someone who has COVID  o or if you develop a fever of 100.4 or greater, sneezing, cough, sore throat, shortness of breath or body aches.

## 2021-10-12 ENCOUNTER — Other Ambulatory Visit: Payer: Self-pay

## 2021-10-12 ENCOUNTER — Encounter (HOSPITAL_COMMUNITY): Payer: Self-pay

## 2021-10-12 ENCOUNTER — Encounter (HOSPITAL_COMMUNITY)
Admission: RE | Admit: 2021-10-12 | Discharge: 2021-10-12 | Disposition: A | Discharge status: Home / Self Care | Payer: Medicare Other | Source: Ambulatory Visit | Attending: Surgery | Admitting: Surgery

## 2021-10-12 VITALS — BP 125/93 | HR 87 | Temp 98.3°F | Resp 18 | Ht 75.0 in | Wt 222.5 lb

## 2021-10-12 DIAGNOSIS — N400 Enlarged prostate without lower urinary tract symptoms: Secondary | ICD-10-CM | POA: Diagnosis not present

## 2021-10-12 DIAGNOSIS — I2510 Atherosclerotic heart disease of native coronary artery without angina pectoris: Secondary | ICD-10-CM | POA: Insufficient documentation

## 2021-10-12 DIAGNOSIS — J3801 Paralysis of vocal cords and larynx, unilateral: Secondary | ICD-10-CM | POA: Insufficient documentation

## 2021-10-12 DIAGNOSIS — Z91199 Patient's noncompliance with other medical treatment and regimen due to unspecified reason: Secondary | ICD-10-CM | POA: Insufficient documentation

## 2021-10-12 DIAGNOSIS — Z960 Presence of urogenital implants: Secondary | ICD-10-CM | POA: Insufficient documentation

## 2021-10-12 DIAGNOSIS — E785 Hyperlipidemia, unspecified: Secondary | ICD-10-CM | POA: Insufficient documentation

## 2021-10-12 DIAGNOSIS — Z8669 Personal history of other diseases of the nervous system and sense organs: Secondary | ICD-10-CM | POA: Insufficient documentation

## 2021-10-12 DIAGNOSIS — Z01812 Encounter for preprocedural laboratory examination: Secondary | ICD-10-CM | POA: Diagnosis not present

## 2021-10-12 DIAGNOSIS — I700 Atherosclerosis of aorta: Secondary | ICD-10-CM | POA: Diagnosis not present

## 2021-10-12 DIAGNOSIS — Z8616 Personal history of COVID-19: Secondary | ICD-10-CM | POA: Insufficient documentation

## 2021-10-12 DIAGNOSIS — Z96642 Presence of left artificial hip joint: Secondary | ICD-10-CM | POA: Diagnosis not present

## 2021-10-12 DIAGNOSIS — Z7982 Long term (current) use of aspirin: Secondary | ICD-10-CM | POA: Insufficient documentation

## 2021-10-12 DIAGNOSIS — D649 Anemia, unspecified: Secondary | ICD-10-CM | POA: Diagnosis not present

## 2021-10-12 DIAGNOSIS — K219 Gastro-esophageal reflux disease without esophagitis: Secondary | ICD-10-CM | POA: Diagnosis not present

## 2021-10-12 DIAGNOSIS — K5792 Diverticulitis of intestine, part unspecified, without perforation or abscess without bleeding: Secondary | ICD-10-CM | POA: Diagnosis not present

## 2021-10-12 DIAGNOSIS — G4733 Obstructive sleep apnea (adult) (pediatric): Secondary | ICD-10-CM | POA: Insufficient documentation

## 2021-10-12 DIAGNOSIS — Z01818 Encounter for other preprocedural examination: Secondary | ICD-10-CM

## 2021-10-12 DIAGNOSIS — Z9049 Acquired absence of other specified parts of digestive tract: Secondary | ICD-10-CM | POA: Insufficient documentation

## 2021-10-12 DIAGNOSIS — K4090 Unilateral inguinal hernia, without obstruction or gangrene, not specified as recurrent: Secondary | ICD-10-CM | POA: Diagnosis not present

## 2021-10-12 DIAGNOSIS — I10 Essential (primary) hypertension: Secondary | ICD-10-CM | POA: Diagnosis not present

## 2021-10-12 DIAGNOSIS — H9190 Unspecified hearing loss, unspecified ear: Secondary | ICD-10-CM | POA: Diagnosis not present

## 2021-10-12 LAB — CBC
HCT: 40.8 % (ref 39.0–52.0)
Hemoglobin: 13.5 g/dL (ref 13.0–17.0)
MCH: 29.9 pg (ref 26.0–34.0)
MCHC: 33.1 g/dL (ref 30.0–36.0)
MCV: 90.5 fL (ref 80.0–100.0)
Platelets: 213 10*3/uL (ref 150–400)
RBC: 4.51 MIL/uL (ref 4.22–5.81)
RDW: 13.5 % (ref 11.5–15.5)
WBC: 8.8 10*3/uL (ref 4.0–10.5)
nRBC: 0 % (ref 0.0–0.2)

## 2021-10-12 LAB — BASIC METABOLIC PANEL
Anion gap: 8 (ref 5–15)
BUN: 15 mg/dL (ref 8–23)
CO2: 28 mmol/L (ref 22–32)
Calcium: 9.1 mg/dL (ref 8.9–10.3)
Chloride: 98 mmol/L (ref 98–111)
Creatinine, Ser: 1.15 mg/dL (ref 0.61–1.24)
GFR, Estimated: >60 mL/min (ref >60)
Glucose, Bld: 93 mg/dL (ref 70–99)
Potassium: 4.4 mmol/L (ref 3.5–5.1)
Sodium: 134 mmol/L — ABNORMAL LOW (ref 135–145)

## 2021-10-12 NOTE — Progress Notes (Signed)
PCP - Dr. Silvestre Mesi Cardiologist - Dr. Candee Furbish  PPM/ICD - n/a  Chest x-ray - 02/16/21 EKG - 02/16/21 Stress Test - 2015 ECHO - denies Cardiac Cath - denies  Sleep Study -OSA+  CPAP - does not use regularly. About 1 time a week  Blood Thinner Instructions: n/a Aspirin Instructions: LD 10/11/21. Hold 5-7 days per cardiology.  NPO at MD  COVID TEST- not indicated. Ambulatory procedure.  Anesthesia review: Yes, cardiac clearance received on 05/31/21.  Patient denies shortness of breath, fever, cough and chest pain at PAT appointment   All instructions explained to the patient, with a verbal understanding of the material. Patient agrees to go over the instructions while at home for a better understanding. Patient also instructed to self quarantine after being tested for COVID-19. The opportunity to ask questions was provided.

## 2021-10-13 NOTE — Progress Notes (Signed)
Anesthesia Chart Review:  Case: [id number] Date/Time: 10/15/21 0715   Procedure: OPEN LEFT INGUINAL HERNIA REPAIR WITH MESH (Left)   Anesthesia type: General   Pre-op diagnosis: LEFT INGUINAL HERNIA   Location: [place] OR ROOM 02 / [place] [facility]   Surgeons: Dwan Bolt, MD       DISCUSSION: Patient is a 76 year old male scheduled for the above procedure.  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 Reported history of "bad dreams" and syncopal incident after anesthesia.   Preoperative cardiology input outlined by Fabian Sharp, [place] on 05/31/21: "Patient was contacted 05/31/2021 in reference to pre-operative risk assessment for pending surgery as outlined below.  Chris Welch was last seen on 09/04/20 by Dr. Marlou Porch.  Since that day, Chris Welch has done well. He does not have a history of ischemic heart disease or stroke. He can complete 4.0 METS without angina (stairs, golf, walking). He may hold ASA for 5-7 days prior to procedure and resume when safe after.   Therefore, based on ACC/AHA guidelines, the patient would be at acceptable risk for the planned procedure without further cardiovascular testing..."   Reported last aspirin dose 10/11/2021.  Anesthesia team to evaluate on the day of surgery.   VS: BP (!) 125/93    Pulse 87    Temp 36.8 C (Oral)    Resp 18    Ht 6\' 3"  (1.905 m)    Wt 100.9 kg    SpO2 97%    BMI 27.81 kg/m    PROVIDERS: Copland, Gay Filler, MD is PCP  Candee Furbish, MD is cardiologist Hollice Espy,  MD is urologist   LABS: Labs reviewed: Acceptable for surgery. (all labs ordered are listed, but only abnormal results are displayed)  Labs Reviewed  BASIC METABOLIC PANEL - Abnormal; Notable for the following components:      Result Value   Sodium 134 (*)    All other components within normal limits  CBC     IMAGES: CXR 02/16/21: IMPRESSION: Low lung volumes with mild perihilar subsegmental atelectasis. No focal infiltrate.   EKG: 02/16/21: ST at 113 bpm. Low voltage QRS. Cannot rule out anteroseptal infarct (age undetermined).    CV: Nuclear stress test 02/06/14 [facility]; scanned under Media tab, Correspondence 01/30/16, pate 18): IMPRESSION: 1. No scintigraphic evidence of Lexiscan-induced ischemia. 2. Normal LV systolic function. LVEF 68%. 3. No wall motion abnormality seen. 4. No TID noted. (TID index of 0.83 which is within normal limits.) 5. Clinical correlation recommended.      Past Medical History:  Diagnosis Date   Adenomatous colon polyp 2009   Anemia    Anxiety    Aortic atherosclerosis ([place])    Arthritis    "hips, knees" (6/71/2458)   Complication of anesthesia    bad dreams-OCC PASSES OUT AFTER ANETHESIA   Depression    Diverticulitis    Diverticulosis    Ear infection ear infection lasting past 2 months, still ongoing, pt on antibiotics & drops. 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   Paralysis of right vocal cord 2008   happened during his carotid tumor surgery   Pneumonia    x 3, last 2009 (01/27/2016)   Sleep apnea    CANNOT TOLERATE CPAP   Tumor of soft tissue of neck 09/2006   Right Cervical Paraganglioma   Vocal cord  paralysis    Right    Past Surgical History:  Procedure Laterality Date   APPENDECTOMY     BACK SURGERY     LOWER   CAROTID BODY TUMOR EXCISION     CERVICAL PARAGANGLIOMA EXCISION Right 12/2006   CHOLECYSTECTOMY OPEN     CYST EXCISION Left 12/17/2019   Procedure: CYST EXCISION; EXCISION NAIL HORN REMNANT LEFT RING FINGER;  Surgeon: Daryll Brod, MD;  Location: [place];  Service: Orthopedics;  Laterality: Left;  IV REGIONAL   CYSTOSCOPY WITH INSERTION OF UROLIFT N/A 10/12/2020   Procedure: CYSTOSCOPY WITH INSERTION OF UROLIFT;  Surgeon: Hollice Espy, MD;  Location: [facility];  Service: Urology;  Laterality: N/A;   DEEP NECK LYMPH NODE BIOPSY / EXCISION     DIGIT NAIL REMOVAL Left 07/30/2019   Procedure: REMOVAL OF NAIL HORNS LEFT RING FINGER;  Surgeon: Daryll Brod, MD;  Location: [facility];  Service: Orthopedics;  Laterality: Left;  IV REGIONAL FOREARM BLOCK   ESOPHAGOGASTRODUODENOSCOPY (EGD) WITH ESOPHAGEAL DILATION     INGUINAL HERNIA REPAIR Right 1954   JOINT REPLACEMENT     KNEE ARTHROSCOPY Right    LUMBAR LAMINECTOMY/DECOMPRESSION MICRODISCECTOMY Left 03/09/2013   Procedure: LUMBAR LAMINECTOMY/DECOMPRESSION MICRODISCECTOMY 1 LEVEL;  Surgeon: Eustace Moore, MD;  Location: [facility];  Service: Neurosurgery;  Laterality: Left;  Left lumbar three-four extra-foraminal microdiscectomy   PATELLA ARTHROPLASTY Right    has had 5 surgeries right knee   TOTAL HIP ARTHROPLASTY Left 01/27/2016   TOTAL HIP ARTHROPLASTY Left 01/27/2016   Procedure: TOTAL HIP ARTHROPLASTY ANTERIOR APPROACH;  Surgeon: Frederik Pear, MD;  Location: [place];  Service: Orthopedics;  Laterality: Left;   UPPER GASTROINTESTINAL ENDOSCOPY     VOCAL CORD IMPLANT      MEDICATIONS:  albuterol (VENTOLIN HFA) 108 (90 Base) MCG/ACT inhaler   amLODipine (NORVASC) 5 MG tablet   aspirin EC 81 MG tablet   benzonatate (TESSALON) 100 MG capsule   buPROPion (WELLBUTRIN XL) 150 MG 24 hr tablet    celecoxib (CELEBREX) 200 MG capsule   COVID-19 mRNA bivalent vaccine, Pfizer, injection   doxycycline (VIBRAMYCIN) 100 MG capsule   ferrous sulfate 325 (65 FE) MG tablet   finasteride (PROSCAR) 5 MG tablet   fluticasone (FLONASE) 50 MCG/ACT nasal spray   glycopyrrolate (ROBINUL) 1 MG tablet   HYDROmorphone (DILAUDID) 2 MG tablet   losartan (COZAAR) 100 MG tablet   omeprazole (PRILOSEC) 20 MG capsule   ondansetron (ZOFRAN) 4 MG tablet   oxyCODONE (OXY IR/ROXICODONE) 5 MG immediate release tablet   pravastatin (PRAVACHOL) 20 MG tablet   promethazine (PHENERGAN) 25 MG tablet   terbinafine (LAMISIL) 250 MG tablet   terbinafine (LAMISIL) 250 MG tablet   tiZANidine (ZANAFLEX) 2 MG tablet   triamcinolone cream (KENALOG) 0.1 %   triamcinolone cream (KENALOG) 0.5 %   valACYclovir (VALTREX) 1000 MG tablet  No current facility-administered medications for this encounter.    Myra Gianotti, PA-C Surgical Short Stay/Anesthesiology [facility] Phone [phone] [facility] Phone [phone] 10/13/2021 3:34 PM

## 2021-10-13 NOTE — Anesthesia Preprocedure Evaluation (Addendum)
Anesthesia Evaluation  Patient identified by MRN, date of birth, ID band Patient awake    Reviewed: Allergy & Precautions, NPO status , Patient's Chart, lab work & pertinent test results  Airway Mallampati: II  TM Distance: >3 FB Neck ROM: Full    Dental no notable dental hx. (+) Teeth Intact, Dental Advisory Given   Pulmonary sleep apnea and Continuous Positive Airway Pressure Ventilation ,    Pulmonary exam normal breath sounds clear to auscultation       Cardiovascular hypertension, Pt. on medications Normal cardiovascular exam Rhythm:Regular Rate:Normal  CV: Nuclear stress test 02/06/14 [facility]; scanned under Media tab, Correspondence 01/30/16, pate 18): IMPRESSION: 1. No scintigraphic evidence of Lexiscan-induced ischemia. 2. Normal LV systolic function. LVEF 68%. 3. No wall motion abnormality seen. 4. No TID noted. (TID index of 0.83 which is within normal limits.) 5. Clinical correlation recommended.    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  Reproductive/Obstetrics                            Anesthesia Physical Anesthesia Plan  ASA: 3  Anesthesia Plan: General   Post-op Pain Management: Tylenol PO (pre-op)   Induction: Intravenous  PONV Risk Score and Plan: 2 and Dexamethasone, Ondansetron and Treatment may vary due to age or medical condition  Airway Management Planned: Oral ETT and Video Laryngoscope Planned  Additional Equipment:   Intra-op Plan:   Post-operative Plan: Extubation in OR  Informed Consent: I have reviewed the patients History and Physical, chart, labs and discussed the procedure including the risks, benefits and alternatives for the proposed anesthesia with the patient or authorized representative who has indicated his/her understanding and acceptance.     Dental advisory given  Plan Discussed with: CRNA  Anesthesia Plan Comments: (PAT note written 10/13/2021 by Myra Gianotti, PA-C. )       Anesthesia Quick Evaluation

## 2021-10-15 ENCOUNTER — Ambulatory Visit (HOSPITAL_COMMUNITY)
Admission: RE | Admit: 2021-10-15 | Discharge: 2021-10-15 | Disposition: A | Discharge status: Home / Self Care | Payer: Medicare Other | Attending: Surgery | Admitting: Surgery

## 2021-10-15 ENCOUNTER — Ambulatory Visit (HOSPITAL_COMMUNITY): Payer: Medicare Other | Admitting: Vascular Surgery

## 2021-10-15 ENCOUNTER — Ambulatory Visit (HOSPITAL_COMMUNITY): Payer: Medicare Other | Admitting: Anesthesiology

## 2021-10-15 ENCOUNTER — Encounter (HOSPITAL_COMMUNITY)
Admission: RE | Disposition: A | Discharge status: Home / Self Care | Payer: Self-pay | Source: Home / Self Care | Attending: Surgery

## 2021-10-15 ENCOUNTER — Other Ambulatory Visit: Payer: Self-pay

## 2021-10-15 ENCOUNTER — Other Ambulatory Visit (HOSPITAL_COMMUNITY): Payer: Self-pay

## 2021-10-15 ENCOUNTER — Encounter (HOSPITAL_COMMUNITY): Payer: Self-pay | Admitting: Surgery

## 2021-10-15 DIAGNOSIS — F32A Depression, unspecified: Secondary | ICD-10-CM | POA: Insufficient documentation

## 2021-10-15 DIAGNOSIS — K4090 Unilateral inguinal hernia, without obstruction or gangrene, not specified as recurrent: Secondary | ICD-10-CM | POA: Diagnosis not present

## 2021-10-15 DIAGNOSIS — I700 Atherosclerosis of aorta: Secondary | ICD-10-CM | POA: Insufficient documentation

## 2021-10-15 DIAGNOSIS — F419 Anxiety disorder, unspecified: Secondary | ICD-10-CM | POA: Diagnosis not present

## 2021-10-15 DIAGNOSIS — H9190 Unspecified hearing loss, unspecified ear: Secondary | ICD-10-CM | POA: Insufficient documentation

## 2021-10-15 DIAGNOSIS — N400 Enlarged prostate without lower urinary tract symptoms: Secondary | ICD-10-CM | POA: Insufficient documentation

## 2021-10-15 DIAGNOSIS — Z79899 Other long term (current) drug therapy: Secondary | ICD-10-CM | POA: Diagnosis not present

## 2021-10-15 DIAGNOSIS — G4733 Obstructive sleep apnea (adult) (pediatric): Secondary | ICD-10-CM | POA: Insufficient documentation

## 2021-10-15 DIAGNOSIS — Z96642 Presence of left artificial hip joint: Secondary | ICD-10-CM | POA: Insufficient documentation

## 2021-10-15 DIAGNOSIS — E785 Hyperlipidemia, unspecified: Secondary | ICD-10-CM | POA: Diagnosis not present

## 2021-10-15 DIAGNOSIS — F418 Other specified anxiety disorders: Secondary | ICD-10-CM | POA: Diagnosis not present

## 2021-10-15 DIAGNOSIS — I10 Essential (primary) hypertension: Secondary | ICD-10-CM | POA: Insufficient documentation

## 2021-10-15 DIAGNOSIS — Z8616 Personal history of COVID-19: Secondary | ICD-10-CM | POA: Insufficient documentation

## 2021-10-15 HISTORY — PX: INGUINAL HERNIA REPAIR: SHX194

## 2021-10-15 HISTORY — PX: INSERTION OF MESH: SHX5868

## 2021-10-15 SURGERY — REPAIR, HERNIA, INGUINAL, ADULT
Anesthesia: General | Site: Groin

## 2021-10-15 MED ORDER — BUPIVACAINE-EPINEPHRINE (PF) 0.25% -1:200000 IJ SOLN
INTRAMUSCULAR | Status: DC | PRN
  Administered 2021-10-15: 30 mL via PERINEURAL

## 2021-10-15 MED ORDER — ONDANSETRON HCL 4 MG/2ML IJ SOLN
INTRAMUSCULAR | Status: DC | PRN
Start: 2021-10-15 — End: 2021-10-15
  Administered 2021-10-15: 4 mg via INTRAVENOUS

## 2021-10-15 MED ORDER — PHENYLEPHRINE 40 MCG/ML (10ML) SYRINGE FOR IV PUSH (FOR BLOOD PRESSURE SUPPORT)
PREFILLED_SYRINGE | INTRAVENOUS | Status: DC | PRN
Start: 2021-10-15 — End: 2021-10-15
  Administered 2021-10-15: 80 ug via INTRAVENOUS
  Administered 2021-10-15: 120 ug via INTRAVENOUS

## 2021-10-15 MED ORDER — PROPOFOL 10 MG/ML IV BOLUS
INTRAVENOUS | Status: DC | PRN
  Administered 2021-10-15: 100 mg via INTRAVENOUS

## 2021-10-15 MED ORDER — FENTANYL CITRATE (PF) 250 MCG/5ML IJ SOLN
INTRAMUSCULAR | Status: DC | PRN
  Administered 2021-10-15: 100 ug via INTRAVENOUS
  Administered 2021-10-15: 50 ug via INTRAVENOUS

## 2021-10-15 MED ORDER — PHENYLEPHRINE HCL-NACL 20-0.9 MG/250ML-% IV SOLN
INTRAVENOUS | Status: DC | PRN
Start: 2021-10-15 — End: 2021-10-15
  Administered 2021-10-15: 30 ug/min via INTRAVENOUS

## 2021-10-15 MED ORDER — DEXAMETHASONE SODIUM PHOSPHATE 10 MG/ML IJ SOLN
INTRAMUSCULAR | Status: DC | PRN
  Administered 2021-10-15: 10 mg via INTRAVENOUS

## 2021-10-15 MED ORDER — PROPOFOL 10 MG/ML IV BOLUS
INTRAVENOUS | Status: AC
  Filled 2021-10-15: qty 20

## 2021-10-15 MED ORDER — ACETAMINOPHEN 500 MG PO TABS
1000.0000 mg | ORAL_TABLET | Freq: Once | ORAL | Status: AC
  Administered 2021-10-15: 1000 mg via ORAL
  Filled 2021-10-15: qty 2

## 2021-10-15 MED ORDER — FENTANYL CITRATE (PF) 100 MCG/2ML IJ SOLN: 25.0000 ug | INTRAMUSCULAR | Status: DC | PRN

## 2021-10-15 MED ORDER — LIDOCAINE 2% (20 MG/ML) 5 ML SYRINGE
INTRAMUSCULAR | Status: DC | PRN
  Administered 2021-10-15: 80 mg via INTRAVENOUS

## 2021-10-15 MED ORDER — ACETAMINOPHEN 500 MG PO TABS: 1000.0000 mg | ORAL_TABLET | ORAL | Status: DC

## 2021-10-15 MED ORDER — OXYCODONE HCL 5 MG PO TABS
5.0000 mg | ORAL_TABLET | Freq: Four times a day (QID) | ORAL | 0 refills | Status: DC | PRN
Start: 2021-10-15 — End: 2021-11-30
  Filled 2021-10-15: qty 20, 5d supply, fill #0

## 2021-10-15 MED ORDER — FENTANYL CITRATE (PF) 250 MCG/5ML IJ SOLN
INTRAMUSCULAR | Status: AC
  Filled 2021-10-15: qty 5

## 2021-10-15 MED ORDER — BUPIVACAINE-EPINEPHRINE (PF) 0.25% -1:200000 IJ SOLN
INTRAMUSCULAR | Status: AC
  Filled 2021-10-15: qty 30

## 2021-10-15 MED ORDER — SUGAMMADEX SODIUM 200 MG/2ML IV SOLN
INTRAVENOUS | Status: DC | PRN
  Administered 2021-10-15: 200 mg via INTRAVENOUS

## 2021-10-15 MED ORDER — LACTATED RINGERS IV SOLN: INTRAVENOUS | Status: DC

## 2021-10-15 MED ORDER — CEFAZOLIN SODIUM-DEXTROSE 2-4 GM/100ML-% IV SOLN
2.0000 g | INTRAVENOUS | Status: AC
  Administered 2021-10-15: 2 g via INTRAVENOUS
  Filled 2021-10-15: qty 100

## 2021-10-15 MED ORDER — ROCURONIUM BROMIDE 10 MG/ML (PF) SYRINGE
PREFILLED_SYRINGE | INTRAVENOUS | Status: DC | PRN
Start: 2021-10-15 — End: 2021-10-15
  Administered 2021-10-15: 100 mg via INTRAVENOUS

## 2021-10-15 MED ORDER — ORAL CARE MOUTH RINSE: 15.0000 mL | Freq: Once | OROMUCOSAL | Status: AC

## 2021-10-15 MED ORDER — 0.9 % SODIUM CHLORIDE (POUR BTL) OPTIME
TOPICAL | Status: DC | PRN
  Administered 2021-10-15: 1000 mL

## 2021-10-15 MED ORDER — CHLORHEXIDINE GLUCONATE 0.12 % MT SOLN
15.0000 mL | Freq: Once | OROMUCOSAL | Status: AC
  Administered 2021-10-15: 15 mL via OROMUCOSAL
  Filled 2021-10-15: qty 15

## 2021-10-15 SURGICAL SUPPLY — 41 items
ADH SKN CLS APL DERMABOND .7 (GAUZE/BANDAGES/DRESSINGS) ×2
APL PRP STRL LF DISP 70% ISPRP (MISCELLANEOUS) ×2
BAG COUNTER SPONGE SURGICOUNT (BAG) ×4 IMPLANT
BAG SPNG CNTER NS LX DISP (BAG) ×2
BLADE CLIPPER SURG (BLADE) IMPLANT
CANISTER SUCT 3000ML PPV (MISCELLANEOUS) IMPLANT
CHLORAPREP W/TINT 26 (MISCELLANEOUS) ×4 IMPLANT
COVER SURGICAL LIGHT HANDLE (MISCELLANEOUS) ×4 IMPLANT
DECANTER SPIKE VIAL GLASS SM (MISCELLANEOUS) ×4 IMPLANT
DERMABOND ADVANCED (GAUZE/BANDAGES/DRESSINGS) ×1
DERMABOND ADVANCED .7 DNX12 (GAUZE/BANDAGES/DRESSINGS) ×3 IMPLANT
DRAIN PENROSE 1/2X12 LTX STRL (WOUND CARE) ×1 IMPLANT
DRAPE LAPAROTOMY TRNSV 102X78 (DRAPES) ×4 IMPLANT
ELECT REM PT RETURN 9FT ADLT (ELECTROSURGICAL) ×3
ELECTRODE REM PT RTRN 9FT ADLT (ELECTROSURGICAL) ×3 IMPLANT
GAUZE 4X4 16PLY ~~LOC~~+RFID DBL (SPONGE) ×4 IMPLANT
GLOVE SURG POLY MICRO LF SZ5.5 (GLOVE) ×4 IMPLANT
GLOVE SURG UNDER POLY LF SZ6 (GLOVE) ×4 IMPLANT
GOWN STRL REUS W/ TWL LRG LVL3 (GOWN DISPOSABLE) ×6 IMPLANT
GOWN STRL REUS W/TWL LRG LVL3 (GOWN DISPOSABLE) ×6
KIT BASIN OR (CUSTOM PROCEDURE TRAY) ×4 IMPLANT
KIT TURNOVER KIT B (KITS) ×4 IMPLANT
MESH ULTRAPRO 3X6 7.6X15CM (Mesh General) ×1 IMPLANT
NDL HYPO 25GX1X1/2 BEV (NEEDLE) ×3 IMPLANT
NEEDLE HYPO 25GX1X1/2 BEV (NEEDLE) ×3 IMPLANT
NS IRRIG 1000ML POUR BTL (IV SOLUTION) ×4 IMPLANT
PACK GENERAL/GYN (CUSTOM PROCEDURE TRAY) ×4 IMPLANT
PAD ARMBOARD 7.5X6 YLW CONV (MISCELLANEOUS) ×4 IMPLANT
PENCIL SMOKE EVACUATOR (MISCELLANEOUS) IMPLANT
SPONGE INTESTINAL PEANUT (DISPOSABLE) IMPLANT
SPONGE T-LAP 18X18 ~~LOC~~+RFID (SPONGE) ×4 IMPLANT
SUT MNCRL AB 4-0 PS2 18 (SUTURE) ×4 IMPLANT
SUT PDS AB 0 CT1 27 (SUTURE) ×2 IMPLANT
SUT SILK 2 0 SH (SUTURE) IMPLANT
SUT VIC AB 2-0 SH 27 (SUTURE) ×3
SUT VIC AB 2-0 SH 27X BRD (SUTURE) ×3 IMPLANT
SUT VIC AB 3-0 SH 27 (SUTURE) ×6
SUT VIC AB 3-0 SH 27X BRD (SUTURE) ×6 IMPLANT
SYR CONTROL 10ML LL (SYRINGE) ×4 IMPLANT
TOWEL GREEN STERILE (TOWEL DISPOSABLE) ×4 IMPLANT
TOWEL GREEN STERILE FF (TOWEL DISPOSABLE) ×4 IMPLANT
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
## 2021-10-15 NOTE — Anesthesia Procedure Notes (Signed)
Procedure Name: Intubation Date/Time: 10/15/2021 7:43 AM Performed by: Griffin Dakin, CRNA Pre-anesthesia Checklist: Patient identified, Emergency Drugs available, Suction available and Patient being monitored Patient Re-evaluated:Patient Re-evaluated prior to induction Oxygen Delivery Method: Circle system utilized Preoxygenation: Pre-oxygenation with 100% oxygen Induction Type: IV induction Ventilation: Mask ventilation without difficulty Laryngoscope Size: Glidescope and 4 Grade View: Grade I Tube type: Oral Tube size: 7.0 mm Number of attempts: 1 Airway Equipment and Method: Rigid stylet and Video-laryngoscopy Placement Confirmation: ETT inserted through vocal cords under direct vision, positive ETCO2 and breath sounds checked- equal and bilateral Secured at: 26 cm Tube secured with: Tape Dental Injury: Teeth and Oropharynx as per pre-operative assessment  Comments: 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

## 2021-10-15 NOTE — Interval H&P Note (Signed)
History and Physical Interval Note:  10/15/2021 7:19 AM  Chris Welch  has presented today for surgery, with the diagnosis of LEFT INGUINAL HERNIA.  The various methods of treatment have been discussed with the patient and family. After consideration of risks, benefits and other options for treatment, the patient has consented to  Procedure(s): OPEN LEFT INGUINAL HERNIA REPAIR WITH MESH (Left) as a surgical intervention.  The patient's history has been reviewed, patient examined, no change in status, stable for surgery.  I have reviewed the patient's chart and labs.  Questions were answered to the patient's satisfaction.  Surgical site confirmed and marked. Proceed to OR, plan for discharge home from PACU.   Dwan Bolt

## 2021-10-15 NOTE — Transfer of Care (Signed)
Immediate Anesthesia Transfer of Care Note  Patient: Kwadwo Taras Tkach  Procedure(s) Performed: OPEN LEFT INGUINAL HERNIA REPAIR WITH MESH (Left: Groin)  Patient Location: PACU  Anesthesia Type:General  Level of Consciousness: awake, alert  and oriented  Airway & Oxygen Therapy: Patient Spontanous Breathing  Post-op Assessment: Report given to RN and Post -op Vital signs reviewed and stable  Post vital signs: Reviewed and stable  Last Vitals:  Vitals Value Taken Time  BP 147/91 10/15/21 0913  Temp    Pulse 80 10/15/21 0915  Resp 16 10/15/21 0915  SpO2 85 % 10/15/21 0915  Vitals shown include unvalidated device data.  Last Pain:  Vitals:   10/15/21 0604  TempSrc:   PainSc: 0-No pain         Complications: No notable events documented.

## 2021-10-15 NOTE — Anesthesia Postprocedure Evaluation (Signed)
Anesthesia Post Note  Patient: Chris Welch  Procedure(s) Performed: OPEN LEFT INGUINAL HERNIA REPAIR (Left: Groin) INSERTION OF MESH (Groin)     Patient location during evaluation: PACU Anesthesia Type: General Level of consciousness: awake and alert Pain management: pain level controlled Vital Signs Assessment: post-procedure vital signs reviewed and stable Respiratory status: spontaneous breathing, nonlabored ventilation, respiratory function stable and patient connected to nasal cannula oxygen Cardiovascular status: blood pressure returned to baseline and stable Postop Assessment: no apparent nausea or vomiting Anesthetic complications: no   No notable events documented.  Last Vitals:  Vitals:   10/15/21 0929 10/15/21 0945  BP: 127/82 126/83  Pulse: 76 77  Resp: 18 (!) 24  Temp:    SpO2: 96% 99%    Last Pain:  Vitals:   10/15/21 0944  TempSrc:   PainSc: 0-No pain                 [name]

## 2021-10-15 NOTE — Op Note (Signed)
Date: 10/15/21  Patient: Chris Welch MRN: [id number]  Preoperative Diagnosis: Left inguinal hernia Postoperative Diagnosis: Same  Procedure: Open left inguinal hernia repair with mesh patch  Surgeon: Michaelle Birks, MD  EBL: Minimal  Anesthesia: General  Specimens: None  Indications: [name] is a 76 yo male who presented with a symptomatic left inguinal hernia. After a discussion of the risks and benefits of surgery, he agreed to proceed with repair.  Findings: Left inguinal hernia with both direct and indirect components.  Procedure details: Informed consent was obtained in the preoperative area prior to the procedure. The patient was brought to the operating room and placed on the table in the supine position. General anesthesia was induced and appropriate lines and drains were placed for intraoperative monitoring. Perioperative antibiotics were administered per SCIP guidelines. The patient was prepped and draped in the usual sterile fashion. A pre-procedure timeout was taken verifying patient identity, surgical site and procedure to be performed.  A transverse incision was made in the left groin two fingerbreadths superior to the pubic tubercle. The subcutaneous tissue and Scarpa's fascia were divided with cautery to expose the external oblique fascia. The fascia was nicked with a 15-blade scalpel and opened medially to the external inguinal ring using metzenbaum scissors, taking care to protect the ilioinguinal nerve. The cord structures were circumferentially dissected out using gentle blunt dissection, and encircled with a penrose. An indirect hernia sac was readily identified and dissected off the cord structures using blunt dissection and cautery. The sac was opened and contained fat but no bowel. The sac was ligated and closed with 3-0 Vicryl suture. There also appeared to be a much smaller direct hernia. Both hernia sacs were reduced into the abdomen. A cord lipoma was  identified, carefully dissected off the cord structures, and excised. A sheet of Ultrapro mesh was then brought onto the field and cut to size, with a slit to accommodate the cord structures. The mesh was secured medially to the pubic tubercle and inferiorly to the shelving edge of the inguinal ligament, using a running 0 PDS suture. Superiorly the mesh was secured to the conjoint tendon using interrupted 2-0 PDS. The tails of the mesh were sutured together laterally to recreate the internal inguinal ring. At the completion the recreated internal inguinal ring was just loose enough to accommodate a single fingerbreadth. The surgical site was thoroughly irrigated with saline and appeared hemostatic. The external oblique fascia was closed with a running 3-0 Vicryl suture. Scarpa's fascia was closed a running 3-0 Vicryl, and the deep dermis was closed with a running 3-0 Vicryl. The skin was closed with a running subcuticular 4-0 monocryl suture. Dermabond was applied.  The patient tolerated the procedure well with no apparent complications.  All counts were correct x2 at the end of the procedure. The patient was extubated and taken to PACU in stable condition.  Michaelle Birks, MD 10/15/21 10:10 AM

## 2021-10-15 NOTE — Discharge Instructions (Addendum)
[facility] DISCHARGE INSTRUCTIONS  Activity No heavy lifting greater than 15 pounds for 6 weeks after surgery. Ok to shower in 24 hours, but do not bathe or submerge incisions underwater. Do not drive while taking narcotic pain medication.  Wound Care Your incisions are covered with skin glue called Dermabond. This will peel off on its own over time. You may shower and allow warm soapy water to run over your incisions. Gently pat dry. Do not submerge your incision underwater. Monitor your incision for any new redness, tenderness, or drainage. It is very common to have swelling in the scrotum after inguinal hernia repair - this will improve with time. You may apply ice packs or use a support strap to help with this.  When to Call us: Difficulty urinating Fever greater than 100.5 New redness, drainage, or swelling at incision site Severe pain, nausea, or vomiting  Follow-up You have an appointment scheduled with Dr. Zenia Resides on November 02, 2021 at 9:45am. This will be at the [facility] Surgery office at [address] [place], [place], [place]. Please arrive at least 15 minutes prior to your scheduled appointment time.  For questions or concerns, please call the office at [phone].

## 2021-10-18 ENCOUNTER — Encounter (HOSPITAL_COMMUNITY): Payer: Self-pay | Admitting: Surgery

## 2021-11-11 ENCOUNTER — Ambulatory Visit: Payer: Medicare Other | Admitting: Family Medicine

## 2021-11-17 ENCOUNTER — Ambulatory Visit: Payer: Medicare Other | Admitting: Family Medicine

## 2021-11-28 NOTE — Progress Notes (Incomplete)
11/28/21 4:51 PM   Chris Welch 02-26-1946 [id number]  Referring provider:  Darreld Welch, Chris Welch [address] [place],  [place] [address] No chief complaint on file.    HPI: Chris Welch is a 76 y.o.male with a personal history of BPH associated with nocturia, who presents today for a 1 year follow-up with IPSS and PVR.  He is s/p UroLift on 10/12/2020. Preop IPSS 24/5.  Previously failed Flomax.  Still taking finasteride every other day.  He is s/p left inguinal hernia repair with Dr Chris Welch on 10/15/2021.   PMH: Past Medical History:  Diagnosis Date   Adenomatous colon polyp 2009   Anemia    Anxiety    Aortic atherosclerosis ([place])    Arthritis    "hips, knees" (7/98/9211)   Complication of anesthesia    bad dreams-OCC PASSES OUT AFTER ANETHESIA   Depression    Diverticulitis    Diverticulosis    Ear infection ear infection lasting past 2 months, still ongoing, pt on antibiotics & drops. 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   Right Cervical Paraganglioma   Vocal cord paralysis    Right    Surgical History: Past Surgical History:  Procedure Laterality Date   APPENDECTOMY     BACK SURGERY     LOWER   CAROTID BODY TUMOR EXCISION     CERVICAL PARAGANGLIOMA EXCISION Right 12/2006   CHOLECYSTECTOMY OPEN     CYST EXCISION Left 12/17/2019   Procedure: CYST EXCISION; EXCISION NAIL HORN REMNANT LEFT RING FINGER;  Surgeon:  Daryll Brod, MD;  Location: [facility];  Service: Orthopedics;  Laterality: Left;  IV REGIONAL   CYSTOSCOPY WITH INSERTION OF UROLIFT N/A 10/12/2020   Procedure: CYSTOSCOPY WITH INSERTION OF UROLIFT;  Surgeon: Hollice Espy, MD;  Location: [facility];  Service: Urology;  Laterality: N/A;   DEEP NECK LYMPH NODE BIOPSY / EXCISION     DIGIT NAIL REMOVAL Left 07/30/2019   Procedure: REMOVAL OF NAIL HORNS LEFT RING FINGER;  Surgeon: Daryll Brod, MD;  Location: [facility];  Service: Orthopedics;  Laterality: Left;  IV REGIONAL FOREARM BLOCK   ESOPHAGOGASTRODUODENOSCOPY (EGD) WITH ESOPHAGEAL DILATION     INGUINAL HERNIA REPAIR Right 1954   INGUINAL HERNIA REPAIR Left 10/15/2021   Procedure: OPEN LEFT INGUINAL HERNIA REPAIR;  Surgeon: Dwan Bolt, MD;  Location: [facility];  Service: General;  Laterality: Left;   INSERTION OF MESH N/A 10/15/2021   Procedure: INSERTION OF MESH;  Surgeon: Dwan Bolt, MD;  Location: [facility];  Service: General;  Laterality: N/A;   JOINT REPLACEMENT     KNEE ARTHROSCOPY Right    LUMBAR LAMINECTOMY/DECOMPRESSION MICRODISCECTOMY Left 03/09/2013   Procedure: LUMBAR LAMINECTOMY/DECOMPRESSION MICRODISCECTOMY 1 LEVEL;  Surgeon: Eustace Moore, MD;  Location: [facility];  Service: Neurosurgery;  Laterality: Left;  Left lumbar three-four extra-foraminal microdiscectomy   PATELLA ARTHROPLASTY Right    has had 5 surgeries right knee   TOTAL HIP ARTHROPLASTY Left 01/27/2016   TOTAL HIP ARTHROPLASTY Left 01/27/2016   Procedure: TOTAL HIP ARTHROPLASTY ANTERIOR APPROACH;  Surgeon: Frederik Pear, MD;  Location: [facility];  Service: Orthopedics;  Laterality: Left;   UPPER GASTROINTESTINAL ENDOSCOPY     VOCAL CORD IMPLANT      Home Medications:  Allergies as of 11/30/2021       Reactions   Hydrocodone Nausea And Vomiting   "can not take on an empty stomach"   Penicillins Other (See Comments), Rash   Ulcers on eyes        Medication List        Accurate  as of November 28, 2021  4:51 PM. If you have any questions, ask your nurse or doctor.          albuterol 108 (90 Base) MCG/ACT inhaler Commonly known as: VENTOLIN HFA Inhale 2 puffs into the lungs every 6 (six) hours as needed for wheezing or shortness of breath.   amLODipine 5 MG tablet Commonly known as: NORVASC Take 1 tablet (5 mg total) by mouth daily.   aspirin 81 MG EC tablet Take 1 tablet by mouth twice daily for 2 weeks and then once daily for 2 weeks What changed:  how much to take when to take this   benzonatate 100 MG capsule Commonly known as: TESSALON Take 1 capsule (100 mg total) by mouth 3 (three) times daily as needed.   buPROPion 150 MG 24 hr tablet Commonly known as: WELLBUTRIN XL Take 3 tablets (450 mg total) by mouth daily. What changed: when to take this   celecoxib 200 MG capsule Commonly known as: CeleBREX Take 1 capsule by mouth twice a day   doxycycline 100 MG capsule Commonly known as: VIBRAMYCIN Take 1 capsule (100 mg total) by mouth 2 (two) times daily.   ferrous sulfate 325 (65 FE) MG tablet Take 325 mg by mouth every Monday, Wednesday, and Friday.   finasteride 5 MG tablet Commonly known as: Proscar Take 1 tablet (5 mg total) by mouth daily. 1 tablet every other day   fluticasone 50 MCG/ACT nasal spray Commonly known as: FLONASE Place 2 sprays into both nostrils daily. What changed:  when to take this reasons to take this   glycopyrrolate 1 MG tablet Commonly known as: ROBINUL Take 1 tablet (1 mg total) by mouth 3 (three) times daily. What changed: when to take this   losartan 100 MG tablet Commonly known as: COZAAR Take 100 mg by mouth every morning.   omeprazole 20 MG capsule Commonly known as: PRILOSEC Take 20 mg by mouth every morning.   ondansetron 4 MG tablet Commonly known as: ZOFRAN Take 1 tablet by mouth every six to eight hours as needed   oxyCODONE 5 MG immediate release tablet Commonly known as: Oxy  IR/ROXICODONE Take 1 tablet (5 mg total) by mouth every 6 (six) hours as needed for severe pain.   Pfizer COVID-19 Vac Bivalent injection Generic drug: COVID-19 mRNA bivalent vaccine Therapist, music) Inject into the muscle.   pravastatin 20 MG tablet Commonly known as: PRAVACHOL Take 1 tablet (20 mg total) by mouth daily.   promethazine 25 MG tablet Commonly known as: PHENERGAN Take 1 tablet by mouth three times a day as needed   terbinafine 250 MG tablet Commonly known as: LAMISIL Take 1 tablet (250 mg  total) by mouth daily.   terbinafine 250 MG tablet Commonly known as: LAMISIL Take 1 tablet (250 mg total) by mouth daily.   tiZANidine 2 MG tablet Commonly known as: ZANAFLEX Take 1 tablet by mouth every 6 to 8 hours   triamcinolone cream 0.5 % Commonly known as: KENALOG Apply 1 application topically 2 (two) times daily. Use as needed on rash spots   triamcinolone cream 0.1 % Commonly known as: KENALOG Apply 1 a small amount to affected area twice a day   valACYclovir 1000 MG tablet Commonly known as: VALTREX Take 1 tablet by mouth three times a day        Allergies:  Allergies  Allergen Reactions   Hydrocodone Nausea And Vomiting    "can not take on an empty stomach"   Penicillins Other (See Comments) and Rash    Ulcers on eyes    Family History: Family History  Problem Relation Age of Onset   Arthritis Brother        back   Emphysema Brother    Cancer Brother        rare cancer-unknown type   Heart disease Mother    Emphysema Father        smoker   Colon cancer Neg Hx    Esophageal cancer Neg Hx    Rectal cancer Neg Hx    Stomach cancer Neg Hx     Social History:  reports that he has never smoked. He has never used smokeless tobacco. He reports current alcohol use of about 3.0 standard drinks per week. He reports that he does not use drugs.   Physical Exam: There were no vitals taken for this visit.  Constitutional:  Alert and oriented, No acute  distress. HEENT: [place] AT, moist mucus membranes.  Trachea midline, no masses. Cardiovascular: No clubbing, cyanosis, or edema. Respiratory: Normal respiratory effort, no increased work of breathing. Skin: No rashes, bruises or suspicious lesions. Neurologic: Grossly intact, no focal deficits, moving all 4 extremities. Psychiatric: Normal mood and affect.  Laboratory Data:  Lab Results  Component Value Date   CREATININE 1.15 10/12/2021    Lab Results  Component Value Date   PSA 1.16 06/07/2021   PSA 1.31 05/29/2019   PSA 0.87 05/24/2018    Lab Results  Component Value Date   TESTOSTERONE 580 05/28/2013    Lab Results  Component Value Date   HGBA1C 5.5 05/29/2019    Urinalysis   Pertinent Imaging: PVR***   Assessment & Plan:     No follow-ups on file.  I,Kailey Littlejohn,acting as a Education administrator for Hollice Espy, MD.,have documented all relevant documentation on the behalf of Hollice Espy, MD,as directed by  Hollice Espy, MD while in the presence of Hollice Espy, [place] [address] [place], [place] [address] [phone]

## 2021-11-30 ENCOUNTER — Encounter: Payer: Self-pay | Admitting: Urology

## 2021-11-30 ENCOUNTER — Ambulatory Visit (INDEPENDENT_AMBULATORY_CARE_PROVIDER_SITE_OTHER): Payer: Medicare Other | Admitting: Urology

## 2021-11-30 ENCOUNTER — Other Ambulatory Visit: Payer: Self-pay

## 2021-11-30 VITALS — BP 130/82 | HR 73 | Ht 75.0 in | Wt 222.0 lb

## 2021-11-30 DIAGNOSIS — I2510 Atherosclerotic heart disease of native coronary artery without angina pectoris: Secondary | ICD-10-CM

## 2021-11-30 DIAGNOSIS — R351 Nocturia: Secondary | ICD-10-CM | POA: Diagnosis not present

## 2021-11-30 DIAGNOSIS — N401 Enlarged prostate with lower urinary tract symptoms: Secondary | ICD-10-CM

## 2021-11-30 LAB — BLADDER SCAN AMB NON-IMAGING: Scan Result: 0

## 2021-12-01 DIAGNOSIS — Z20822 Contact with and (suspected) exposure to covid-19: Secondary | ICD-10-CM | POA: Diagnosis not present

## 2021-12-07 DIAGNOSIS — M25561 Pain in right knee: Secondary | ICD-10-CM | POA: Diagnosis not present

## 2021-12-16 ENCOUNTER — Telehealth: Payer: Self-pay | Admitting: Family Medicine

## 2021-12-16 NOTE — Telephone Encounter (Signed)
Left message for patient to call back and schedule Medicare Annual Wellness Visit (AWV) in office.  ? ?If not able to come in office, please offer to do virtually or by telephone.  Left office number and my jabber [phone]. ? ?Last AWV:03/06/2020 ? ?Please schedule at anytime with Nurse Health Advisor. ?  ?

## 2022-01-01 ENCOUNTER — Encounter (HOSPITAL_COMMUNITY): Payer: Self-pay | Admitting: Emergency Medicine

## 2022-01-01 ENCOUNTER — Other Ambulatory Visit: Payer: Self-pay

## 2022-01-01 ENCOUNTER — Ambulatory Visit (HOSPITAL_COMMUNITY)
Admission: EM | Admit: 2022-01-01 | Discharge: 2022-01-01 | Disposition: A | Discharge status: Home / Self Care | Payer: Medicare Other | Attending: Emergency Medicine | Admitting: Emergency Medicine

## 2022-01-01 DIAGNOSIS — H8110 Benign paroxysmal vertigo, unspecified ear: Secondary | ICD-10-CM | POA: Diagnosis not present

## 2022-01-01 MED ORDER — MECLIZINE HCL 25 MG PO TABS: 25.0000 mg | ORAL_TABLET | Freq: Three times a day (TID) | ORAL | 0 refills | Status: AC | PRN

## 2022-01-01 NOTE — Discharge Instructions (Addendum)
Your exam today was normal there were no neurologic deficits I suspect you may have a serous otitis a little fluid behind the tympanic membrane.  Try meclizine as discussed.  Go to ER at any time if balance symptoms worsen despite medication and continue follow-up with your ENT. ?

## 2022-01-01 NOTE — ED Provider Notes (Signed)
?[place] ? ? ?MRN: [id number] DOB: June 12, 1946 ? ?Subjective:  ? ?Chief Complaint;  ?Chief Complaint  ?Patient presents with  ? Gait Problem  ? Ear Fullness  ? ? ?Chris Welch is a 76 y.o. male presenting for slight balance issue when starting motion to walk.  He denies spinning, lightheadedness sensation.  He has chronic problems with his ears that he regularly visits ENT for.  He just had his ears washed out last week.  He denies any current discharge from his ears or fevers.  He does feel like he has ear fullness. ? ?No current facility-administered medications for this encounter. ? ?Current Outpatient Medications:  ?  meclizine (ANTIVERT) 25 MG tablet, Take 1 tablet (25 mg total) by mouth 3 (three) times daily as needed for dizziness., Disp: 30 tablet, Rfl: 0 ?  albuterol (VENTOLIN HFA) 108 (90 Base) MCG/ACT inhaler, Inhale 2 puffs into the lungs every 6 (six) hours as needed for wheezing or shortness of breath., Disp: 8 g, Rfl: 0 ?  amLODipine (NORVASC) 5 MG tablet, Take 1 tablet (5 mg total) by mouth daily., Disp: 90 tablet, Rfl: 3 ?  aspirin EC 81 MG tablet, Take 1 tablet by mouth twice daily for 2 weeks and then once daily for 2 weeks (Patient taking differently: Take 81 mg by mouth daily.), Disp: 120 tablet, Rfl: 0 ?  benzonatate (TESSALON) 100 MG capsule, Take 1 capsule (100 mg total) by mouth 3 (three) times daily as needed., Disp: 30 capsule, Rfl: 0 ?  buPROPion (WELLBUTRIN XL) 150 MG 24 hr tablet, Take 3 tablets (450 mg total) by mouth daily. (Patient taking differently: Take 450 mg by mouth in the morning and at bedtime.), Disp: 270 tablet, Rfl: 3 ?  celecoxib (CELEBREX) 200 MG capsule, Take 1 capsule by mouth twice a day, Disp: 60 capsule, Rfl: 0 ?  COVID-19 mRNA bivalent vaccine, Pfizer, injection, Inject into the muscle., Disp: 0.3 mL, Rfl: 0 ?  doxycycline (VIBRAMYCIN) 100 MG capsule, Take 1 capsule (100 mg total) by mouth 2 (two) times daily., Disp: 20 capsule, Rfl:  0 ?  ferrous sulfate 325 (65 FE) MG tablet, Take 325 mg by mouth every Monday, Wednesday, and Friday., Disp: , Rfl:  ?  fluticasone (FLONASE) 50 MCG/ACT nasal spray, Place 2 sprays into both nostrils daily. (Patient taking differently: Place 2 sprays into both nostrils daily as needed for allergies.), Disp: 48 g, Rfl: 4 ?  glycopyrrolate (ROBINUL) 1 MG tablet, Take 1 tablet (1 mg total) by mouth 3 (three) times daily. (Patient taking differently: Take 1 mg by mouth 2 (two) times daily.), Disp: 270 tablet, Rfl: 3 ?  losartan (COZAAR) 100 MG tablet, Take 100 mg by mouth every morning., Disp: , Rfl:  ?  omeprazole (PRILOSEC) 20 MG capsule, Take 20 mg by mouth every morning., Disp: , Rfl:  ?  ondansetron (ZOFRAN) 4 MG tablet, Take 1 tablet by mouth every six to eight hours as needed, Disp: 30 tablet, Rfl: 0 ?  pravastatin (PRAVACHOL) 20 MG tablet, Take 1 tablet (20 mg total) by mouth daily., Disp: 90 tablet, Rfl: 3 ?  tamsulosin (FLOMAX) 0.4 MG CAPS capsule, SMARTSIG:1 By Mouth, Disp: , Rfl:  ?  terbinafine (LAMISIL) 250 MG tablet, Take 1 tablet (250 mg total) by mouth daily., Disp: 14 tablet, Rfl: 0 ?  tiZANidine (ZANAFLEX) 2 MG tablet, Take 1 tablet by mouth every 6 to 8 hours, Disp: 60 tablet, Rfl: 0 ?  triamcinolone cream (KENALOG)  0.1 %, Apply 1 a small amount to affected area twice a day, Disp: 454 g, Rfl: 0 ?  triamcinolone cream (KENALOG) 0.5 %, Apply 1 application topically 2 (two) times daily. Use as needed on rash spots, Disp: 90 g, Rfl: 1  ? ?Allergies  ?Allergen Reactions  ? Hydrocodone Nausea And Vomiting  ?  "can not take on an empty stomach"  ? Penicillins Other (See Comments) and Rash  ?  Ulcers on eyes  ? ? ?Past Medical History:  ?Diagnosis Date  ? Adenomatous colon polyp 2009  ? Anemia   ? Anxiety   ? Aortic atherosclerosis ([place])   ? Arthritis   ? "hips, knees" (01/27/2016)  ? Complication of anesthesia   ? bad dreams-OCC PASSES OUT AFTER ANETHESIA  ? Depression   ? Diverticulitis   ? Diverticulosis    ? Ear infection ear infection lasting past 2 months, still ongoing, pt on antibiotics & drops. pt states "ear infection has eaten holes through bilateral ear drums,  I am hard of hearing"  ? Enlarged prostate   ? Esophageal stricture   ? Gastropathy   ? reactive  ? GERD (gastroesophageal reflux disease)   ? Hard of hearing   ? History of carotid body tumor 2008  ? Hyperlipidemia   ? Hypertension   ? 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 Paralysis of right vocal cord 2008  ? happened during his carotid tumor surgery  ? Pneumonia   ? x 3, last 2009 (01/27/2016)  ? Sleep apnea   ? CANNOT TOLERATE CPAP  ? 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 Right Cervical Paraganglioma  ? Vocal cord paralysis   ? Right  ?  ? ?ROS ? ? ?Objective:  ? ?Vitals: ?BP (!) 147/108 (BP Location: Right Arm)   Pulse 98   Temp 98 ?F (36.7 ?C) (Oral)   Resp 20   SpO2 98%  ? ?Physical Exam ?Vitals and nursing note reviewed.  ?Constitutional:   ?   General: He is not in acute distress. ?   Appearance: He is well-developed.  ?HENT:  ?   Head: Normocephalic and atraumatic.  ?   Right Ear: Tympanic membrane, ear canal and external ear normal. There is no impacted cerumen.  ?   Left Ear: Tympanic membrane, ear canal and external ear normal. There is no impacted cerumen.  ?Eyes:  ?   Conjunctiva/sclera: Conjunctivae normal.  ?Cardiovascular:  ?   Rate and Rhythm: Normal rate and regular rhythm.  ?   Heart sounds: No murmur heard. ?Pulmonary:  ?   Effort: Pulmonary effort is normal. No respiratory distress.  ?   Breath sounds: Normal breath sounds.  ?Abdominal:  ?   Palpations: Abdomen is soft.  ?   Tenderness: There is no abdominal tenderness.  ?Musculoskeletal:     ?   General: No swelling.  ?   Cervical back: Neck supple.  ?Skin: ?   General: Skin is warm and dry.  ?   Capillary Refill: Capillary refill takes less than 2 seconds.  ?Neurological:  ?   General: No focal deficit present.  ?   Mental Status: He is alert and oriented to  person, place, and time. Mental status is at baseline.  ?   Cranial Nerves: No cranial nerve deficit.  ?   Sensory: Sensory deficit present.  ?   Motor: No weakness.  ?   Coordination: Coordination abnormal.  ?   Gait:  Gait abnormal.  ?Psychiatric:     ?   Mood and Affect: Mood normal.  ? ? ?No results found for this or any previous visit (from the past 24 hour(s)). ? ?No results found.  ?  ? ?Assessment and Plan :  ? ?1. Benign paroxysmal positional vertigo, unspecified laterality   ? ? ?Meds ordered this encounter  ?Medications  ? meclizine (ANTIVERT) 25 MG tablet  ?  Sig: Take 1 tablet (25 mg total) by mouth 3 (three) times daily as needed for dizziness.  ?  Dispense:  30 tablet  ?  Refill:  0  ? ? ?MDM:  ?Chris Welch is a 76 y.o. male presenting for mild balance disturbance on initiating walking that started yesterday.  He denies weakness or any focal deficits.  His exam is unremarkable.  I suspect patient has mild serous otitis causing the mild imbalance sensation given the chronic issues he has with his ears.  I have a low suspicion for cerebellar infarct or abnormality but could not rule out given his extensive history.  I encourage patient to try meclizine and to follow-up in the ER if not improving or worse at any time for reevaluation and further diagnostics.  I discussed treatment, follow up and return instructions. Questions were answered. Patient stated understanding of instructions and is stable for discharge. ? ?Leida Lauth FNP-C MCN  ?  ?Hezzie Bump, NP ?01/01/22 1340 ? ?

## 2022-01-01 NOTE — ED Triage Notes (Signed)
Pt reports that he had been off balance walking when for past couple days. Reports had issues with left ear infections so doesn't know if related. Reports when he stops walking and then takes off walking again will feel unsteady at first.  ?

## 2022-01-15 DIAGNOSIS — Z20822 Contact with and (suspected) exposure to covid-19: Secondary | ICD-10-CM | POA: Diagnosis not present

## 2022-01-26 DIAGNOSIS — Z20822 Contact with and (suspected) exposure to covid-19: Secondary | ICD-10-CM | POA: Diagnosis not present

## 2022-02-07 ENCOUNTER — Ambulatory Visit: Payer: Medicare Other

## 2022-02-09 ENCOUNTER — Ambulatory Visit: Payer: Medicare Other

## 2022-04-26 NOTE — Progress Notes (Signed)
This encounter was created in error - please disregard.

## 2022-05-31 ENCOUNTER — Ambulatory Visit (INDEPENDENT_AMBULATORY_CARE_PROVIDER_SITE_OTHER): Payer: Medicare Other

## 2022-05-31 DIAGNOSIS — Z0000 Encounter for general adult medical examination without abnormal findings: Secondary | ICD-10-CM

## 2022-05-31 NOTE — Patient Instructions (Signed)
Chris Welch , Thank you for taking time to come for your Medicare Wellness Visit. I appreciate your ongoing commitment to your health goals. Please review the following plan we discussed and let me know if I can assist you in the future.   Screening recommendations/referrals: Colonoscopy: done 05/20/20 repeat every 3 years  Recommended yearly ophthalmology/optometry visit for glaucoma screening and checkup Recommended yearly dental visit for hygiene and checkup  Vaccinations: Influenza vaccine: done 06/07/21 repeat every year  Pneumococcal vaccine: Up to date Tdap vaccine: due  Shingles vaccine: completed 1/7, 03/11/19   Covid-19: completed 2/14, 11/25/19, 1/7, 06/07/21  Advanced directives: Please bring a copy of your health care power of attorney and living will to the office at your convenience.  Conditions/risks identified: maintain health   Next appointment: Follow up in one year for your annual wellness visit.   Preventive Care 11 Years and Older, Male Preventive care refers to lifestyle choices and visits with your health care provider that can promote health and wellness. What does preventive care include? A yearly physical exam. This is also called an annual well check. Dental exams once or twice a year. Routine eye exams. Ask your health care provider how often you should have your eyes checked. Personal lifestyle choices, including: Daily care of your teeth and gums. Regular physical activity. Eating a healthy diet. Avoiding tobacco and drug use. Limiting alcohol use. Practicing safe sex. Taking low doses of aspirin every day. Taking vitamin and mineral supplements as recommended by your health care provider. What happens during an annual well check? The services and screenings done by your health care provider during your annual well check will depend on your age, overall health, lifestyle risk factors, and family history of disease. Counseling  Your health care  provider may ask you questions about your: Alcohol use. Tobacco use. Drug use. Emotional well-being. Home and relationship well-being. Sexual activity. Eating habits. History of falls. Memory and ability to understand (cognition). Work and work Statistician. Screening  You may have the following tests or measurements: Height, weight, and BMI. Blood pressure. Lipid and cholesterol levels. These may be checked every 5 years, or more frequently if you are over 3 years old. Skin check. Lung cancer screening. You may have this screening every year starting at age 23 if you have a 30-pack-year history of smoking and currently smoke or have quit within the past 15 years. Fecal occult blood test (FOBT) of the stool. You may have this test every year starting at age 79. Flexible sigmoidoscopy or colonoscopy. You may have a sigmoidoscopy every 5 years or a colonoscopy every 10 years starting at age 44. Prostate cancer screening. Recommendations will vary depending on your family history and other risks. Hepatitis C blood test. Hepatitis B blood test. Sexually transmitted disease (STD) testing. Diabetes screening. This is done by checking your blood sugar (glucose) after you have not eaten for a while (fasting). You may have this done every 1-3 years. Abdominal aortic aneurysm (AAA) screening. You may need this if you are a current or former smoker. Osteoporosis. You may be screened starting at age 41 if you are at high risk. Talk with your health care provider about your test results, treatment options, and if necessary, the need for more tests. Vaccines  Your health care provider may recommend certain vaccines, such as: Influenza vaccine. This is recommended every year. Tetanus, diphtheria, and acellular pertussis (Tdap, Td) vaccine. You may need a Td booster every 10 years. Zoster vaccine. You  may need this after age 18. Pneumococcal 13-valent conjugate (PCV13) vaccine. One dose is  recommended after age 55. Pneumococcal polysaccharide (PPSV23) vaccine. One dose is recommended after age 45. Talk to your health care provider about which screenings and vaccines you need and how often you need them. This information is not intended to replace advice given to you by your health care provider. Make sure you discuss any questions you have with your health care provider. Document Released: 10/02/2015 Document Revised: 05/25/2016 Document Reviewed: 07/07/2015 Elsevier Interactive Patient Education  2017 [place] Prevention in the Home Falls can cause injuries. They can happen to people of all ages. There are many things you can do to make your home safe and to help prevent falls. What can I do on the outside of my home? Regularly fix the edges of walkways and driveways and fix any cracks. Remove anything that might make you trip as you walk through a door, such as a raised step or threshold. Trim any bushes or trees on the path to your home. Use bright outdoor lighting. Clear any walking paths of anything that might make someone trip, such as rocks or tools. Regularly check to see if handrails are loose or broken. Make sure that both sides of any steps have handrails. Any raised decks and porches should have guardrails on the edges. Have any leaves, snow, or ice cleared regularly. Use sand or salt on walking paths during winter. Clean up any spills in your garage right away. This includes oil or grease spills. What can I do in the bathroom? Use night lights. Install grab bars by the toilet and in the tub and shower. Do not use towel bars as grab bars. Use non-skid mats or decals in the tub or shower. If you need to sit down in the shower, use a plastic, non-slip stool. Keep the floor dry. Clean up any water that spills on the floor as soon as it happens. Remove soap buildup in the tub or shower regularly. Attach bath mats securely with double-sided non-slip rug  tape. Do not have throw rugs and other things on the floor that can make you trip. What can I do in the bedroom? Use night lights. Make sure that you have a light by your bed that is easy to reach. Do not use any sheets or blankets that are too big for your bed. They should not hang down onto the floor. Have a firm chair that has side arms. You can use this for support while you get dressed. Do not have throw rugs and other things on the floor that can make you trip. What can I do in the kitchen? Clean up any spills right away. Avoid walking on wet floors. Keep items that you use a lot in easy-to-reach places. If you need to reach something above you, use a strong step stool that has a grab bar. Keep electrical cords out of the way. Do not use floor polish or wax that makes floors slippery. If you must use wax, use non-skid floor wax. Do not have throw rugs and other things on the floor that can make you trip. What can I do with my stairs? Do not leave any items on the stairs. Make sure that there are handrails on both sides of the stairs and use them. Fix handrails that are broken or loose. Make sure that handrails are as long as the stairways. Check any carpeting to make sure that it is firmly  attached to the stairs. Fix any carpet that is loose or worn. Avoid having throw rugs at the top or bottom of the stairs. If you do have throw rugs, attach them to the floor with carpet tape. Make sure that you have a light switch at the top of the stairs and the bottom of the stairs. If you do not have them, ask someone to add them for you. What else can I do to help prevent falls? Wear shoes that: Do not have high heels. Have rubber bottoms. Are comfortable and fit you well. Are closed at the toe. Do not wear sandals. If you use a stepladder: Make sure that it is fully opened. Do not climb a closed stepladder. Make sure that both sides of the stepladder are locked into place. Ask someone to  hold it for you, if possible. Clearly mark and make sure that you can see: Any grab bars or handrails. First and last steps. Where the edge of each step is. Use tools that help you move around (mobility aids) if they are needed. These include: Canes. Walkers. Scooters. Crutches. Turn on the lights when you go into a dark area. Replace any light bulbs as soon as they burn out. Set up your furniture so you have a clear path. Avoid moving your furniture around. If any of your floors are uneven, fix them. If there are any pets around you, be aware of where they are. Review your medicines with your doctor. Some medicines can make you feel dizzy. This can increase your chance of falling. Ask your doctor what other things that you can do to help prevent falls. This information is not intended to replace advice given to you by your health care provider. Make sure you discuss any questions you have with your health care provider. Document Released: 07/02/2009 Document Revised: 02/11/2016 Document Reviewed: 10/10/2014 Elsevier Interactive Patient Education  2017 [employer].

## 2022-05-31 NOTE — Progress Notes (Cosign Needed Addendum)
Virtual Visit via Telephone Note  I connected with  Chris Welch on 05/31/22 at  3:00 PM EDT by telephone and verified that I am speaking with the correct person using two identifiers.  Medicare Annual Wellness visit completed telephonically due to Covid-19 pandemic.   Persons participating in this call: This Health Coach and this patient.   Location: Patient: home Provider: office   I discussed the limitations, risks, security and privacy concerns of performing an evaluation and management service by telephone and the availability of in person appointments. The patient expressed understanding and agreed to proceed.  Unable to perform video visit due to video visit attempted and failed and/or patient does not have video capability.   Some vital signs may be absent or patient reported.   Willette Brace, LPN   Subjective:   Chris Welch is a 76 y.o. male who presents for Medicare Annual/Subsequent preventive examination.  Review of Systems     Cardiac Risk Factors include: advanced age (>54mn, >>3women);male gender;hypertension     Objective:    There were no vitals filed for this visit. There is no height or weight on file to calculate BMI.     05/31/2022    3:15 PM 10/12/2021   11:01 AM 02/16/2021    7:04 AM 10/12/2020    7:28 AM 10/08/2020    4:10 PM 03/06/2020    8:26 AM 12/17/2019    9:01 AM  Advanced Directives  Does Patient Have a Medical Advance Directive? No No No No No No No  Would patient like information on creating a medical advance directive? No - Patient declined No - Patient declined No - Patient declined No - Patient declined  No - Patient declined No - Patient declined    Current Medications (verified) Outpatient Encounter Medications as of 05/31/2022  Medication Sig   albuterol (VENTOLIN HFA) 108 (90 Base) MCG/ACT inhaler Inhale 2 puffs into the lungs every 6 (six) hours as needed for wheezing or shortness of breath.   amLODipine  (NORVASC) 5 MG tablet Take 1 tablet (5 mg total) by mouth daily.   aspirin EC 81 MG tablet Take 1 tablet by mouth twice daily for 2 weeks and then once daily for 2 weeks (Patient taking differently: Take 81 mg by mouth daily.)   buPROPion (WELLBUTRIN XL) 150 MG 24 hr tablet Take 3 tablets (450 mg total) by mouth daily. (Patient taking differently: Take 450 mg by mouth in the morning and at bedtime.)   celecoxib (CELEBREX) 200 MG capsule Take 1 capsule by mouth twice a day   doxycycline (VIBRAMYCIN) 100 MG capsule Take 1 capsule (100 mg total) by mouth 2 (two) times daily.   ferrous sulfate 325 (65 FE) MG tablet Take 325 mg by mouth every Monday, Wednesday, and Friday.   finasteride (PROSCAR) 2.5 mg tablet Take 5 mg by mouth daily.   fluticasone (FLONASE) 50 MCG/ACT nasal spray Place 2 sprays into both nostrils daily. (Patient taking differently: Place 2 sprays into both nostrils daily as needed for allergies.)   glycopyrrolate (ROBINUL) 1 MG tablet Take 1 tablet (1 mg total) by mouth 3 (three) times daily. (Patient taking differently: Take 1 mg by mouth 2 (two) times daily.)   losartan (COZAAR) 100 MG tablet Take 100 mg by mouth every morning.   meclizine (ANTIVERT) 25 MG tablet Take 1 tablet (25 mg total) by mouth 3 (three) times daily as needed for dizziness.   omeprazole (PRILOSEC) 20 MG capsule Take 20  mg by mouth every morning.   ondansetron (ZOFRAN) 4 MG tablet Take 1 tablet by mouth every six to eight hours as needed   pravastatin (PRAVACHOL) 20 MG tablet Take 1 tablet (20 mg total) by mouth daily.   tamsulosin (FLOMAX) 0.4 MG CAPS capsule SMARTSIG:1 By Mouth   terbinafine (LAMISIL) 250 MG tablet Take 1 tablet (250 mg total) by mouth daily.   tiZANidine (ZANAFLEX) 2 MG tablet Take 1 tablet by mouth every 6 to 8 hours   triamcinolone cream (KENALOG) 0.1 % Apply 1 a small amount to affected area twice a day   triamcinolone cream (KENALOG) 0.5 % Apply 1 application topically 2 (two) times  daily. Use as needed on rash spots   COVID-19 mRNA bivalent vaccine, Pfizer, injection Inject into the muscle.   [DISCONTINUED] benzonatate (TESSALON) 100 MG capsule Take 1 capsule (100 mg total) by mouth 3 (three) times daily as needed. (Patient not taking: Reported on 05/31/2022)   No facility-administered encounter medications on file as of 05/31/2022.    Allergies (verified) Hydrocodone and Penicillins   History: Past Medical History:  Diagnosis Date   Adenomatous colon polyp 2009   Anemia    Anxiety    Aortic atherosclerosis ([place])    Arthritis    "hips, knees" (2/83/1517)   Complication of anesthesia    bad dreams-OCC PASSES OUT AFTER ANETHESIA   Depression    Diverticulitis    Diverticulosis    Ear infection ear infection lasting past 2 months, still ongoing, pt on antibiotics & drops. 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   Right Cervical Paraganglioma   Vocal cord paralysis    Right   Past Surgical History:  Procedure Laterality Date   APPENDECTOMY     BACK SURGERY     LOWER   CAROTID BODY TUMOR EXCISION     CERVICAL PARAGANGLIOMA EXCISION Right 12/2006   CHOLECYSTECTOMY OPEN     CYST EXCISION Left 12/17/2019   Procedure: CYST EXCISION; EXCISION NAIL HORN REMNANT LEFT RING FINGER;  Surgeon: Daryll Brod, MD;  Location: [facility];  Service: Orthopedics;  Laterality: Left;  IV REGIONAL   CYSTOSCOPY WITH INSERTION OF UROLIFT N/A 10/12/2020    Procedure: CYSTOSCOPY WITH INSERTION OF UROLIFT;  Surgeon: Hollice Espy, MD;  Location: [facility];  Service: Urology;  Laterality: N/A;   DEEP NECK LYMPH NODE BIOPSY / EXCISION     DIGIT NAIL REMOVAL Left 07/30/2019   Procedure: REMOVAL OF NAIL HORNS LEFT RING FINGER;  Surgeon: Daryll Brod, MD;  Location: [facility];  Service: Orthopedics;  Laterality: Left;  IV REGIONAL FOREARM BLOCK   ESOPHAGOGASTRODUODENOSCOPY (EGD) WITH ESOPHAGEAL DILATION     INGUINAL HERNIA REPAIR Right 1954   INGUINAL HERNIA REPAIR Left 10/15/2021   Procedure: OPEN LEFT INGUINAL HERNIA REPAIR;  Surgeon: Dwan Bolt, MD;  Location: [facility];  Service: General;  Laterality: Left;   INSERTION OF MESH N/A 10/15/2021   Procedure: INSERTION OF MESH;  Surgeon: Dwan Bolt, MD;  Location: [facility];  Service: General;  Laterality: N/A;   JOINT REPLACEMENT     KNEE ARTHROSCOPY Right    LUMBAR LAMINECTOMY/DECOMPRESSION MICRODISCECTOMY Left 03/09/2013   Procedure: LUMBAR LAMINECTOMY/DECOMPRESSION MICRODISCECTOMY 1 LEVEL;  Surgeon: Eustace Moore, MD;  Location: [facility];  Service: Neurosurgery;  Laterality: Left;  Left lumbar three-four extra-foraminal microdiscectomy   PATELLA ARTHROPLASTY Right    has had 5 surgeries right knee   TOTAL HIP ARTHROPLASTY Left 01/27/2016   TOTAL HIP ARTHROPLASTY Left 01/27/2016   Procedure: TOTAL HIP ARTHROPLASTY ANTERIOR APPROACH;  Surgeon: Frederik Pear, MD;  Location: [facility];  Service: Orthopedics;  Laterality: Left;   UPPER GASTROINTESTINAL ENDOSCOPY     VOCAL CORD IMPLANT     Family History  Problem Relation Age of Onset   Arthritis Brother        back   Emphysema Brother    Cancer Brother        rare cancer-unknown type   Heart disease Mother    Emphysema Father        smoker   Colon cancer Neg Hx    Esophageal cancer Neg Hx    Rectal cancer Neg Hx    Stomach cancer Neg Hx    Social History   Socioeconomic History   Marital status: Married    Spouse name: Marlowe Kays  Thorner   Number of children: 4   Years of education: 19   Highest education level: Not on file  Occupational History   Occupation: Machinist-retired 2015    Employer: [employer]    Comment: [employer]   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  Three children are grown.  Younger daughter will graduate from [employer] in 01/2013.  Both sons and oldest daughter are in [employer]. Exercises regularly.   His wife retired 12/2015 and is enjoying spending time in the yard, and with their family.   Social Determinants of Health   Financial Resource Strain: Low Risk  (05/31/2022)   Overall Financial Resource Strain (CARDIA)    Difficulty of Paying Living Expenses: Not hard at all  Food Insecurity: No Food Insecurity (05/31/2022)   Hunger Vital Sign    Worried About Running Out of Food in the Last Year: Never true    Ran Out of Food in the Last Year: Never true  Transportation Needs: No Transportation Needs (05/31/2022)   PRAPARE - Hydrologist (Medical): No    Lack of Transportation (Non-Medical): No  Physical Activity: Sufficiently Active (05/31/2022)   Exercise Vital Sign    Days of Exercise per Week: 3 days    Minutes of Exercise per Session: 120 min  Stress: No Stress Concern Present (05/31/2022)   [place]  Feeling of Stress : Not at all  Social Connections: Moderately Isolated (05/31/2022)   Social Connection and Isolation Panel [NHANES]    Frequency of Communication with Friends and  Family: Once a week    Frequency of Social Gatherings with Friends and Family: More than three times a week    Attends Religious Services: Never    Marine scientist or Organizations: No    Attends Music therapist: Never    Marital Status: Married    Tobacco Counseling Counseling given: Not Answered   Clinical Intake:  Pre-visit preparation completed: Yes  Pain : No/denies pain     Nutritional Risks: None Diabetes: No  How often do you need to have someone help you when you read instructions, pamphlets, or other written materials from your doctor or pharmacy?: 1 - Never  Diabetic?no  Interpreter Needed?: No  Information entered by :: Charlott Rakes, LPN   Activities of Daily Living    05/31/2022    3:16 PM 10/12/2021   11:03 AM  In your present state of health, do you have any difficulty performing the following activities:  Hearing? 0   Vision? 0   Difficulty concentrating or making decisions? 0   Walking or climbing stairs? 0   Dressing or bathing? 0   Doing errands, shopping? 0 0  Preparing Food and eating ? N   Using the Toilet? N   In the past six months, have you accidently leaked urine? N   Do you have problems with loss of bowel control? N   Managing your Medications? N   Managing your Finances? N   Housekeeping or managing your Housekeeping? N     Patient Care Team: Copland, Gay Filler, MD as PCP - General (Family Medicine) Jerline Pain, MD as PCP - Cardiology (Cardiology) Jerline Pain, MD as Consulting Physician (Cardiology)  Indicate any recent Medical Services you may have received from other than [facility] providers in the past year (date may be approximate).     Assessment:   This is a routine wellness examination for [place].  Hearing/Vision screen Hearing Screening - Comments:: Pt wears hearing issues  [facility]   Dietary issues and exercise activities discussed: Current  Exercise Habits: Home exercise routine, Type of exercise: walking, Time (Minutes): > 60 (golf), Frequency (Times/Week): 3, Weekly Exercise (Minutes/Week): 0   Goals Addressed             This Visit's Progress    Patient Stated       Maintain health       Depression Screen    05/31/2022    3:13 PM 06/07/2021    1:17 PM 03/06/2020    8:34 AM 01/05/2016    1:22 PM 06/04/2015    4:44 PM 11/26/2013    8:10 AM  PHQ 2/9 Scores  PHQ - 2 Score 0 0 1 2 0 4  PHQ- 9 Score    16  4    Fall Risk    05/31/2022    3:15 PM 06/07/2021    1:17 PM 03/06/2020    8:34 AM 05/24/2018    9:09 AM 01/05/2016    1:22 PM  Fall Risk   Falls in the past year? 0 0 0 No No  Number falls in past yr: 0 0 0    Injury with Fall? 0 0 0    Risk for fall due to : Impaired vision  Follow up Falls prevention discussed  Education provided;Falls prevention discussed      FALL RISK PREVENTION PERTAINING TO THE HOME:  Any stairs in or around the home? Yes  If so, are there any without handrails? No  Home free of loose throw rugs in walkways, pet beds, electrical cords, etc? Yes  Adequate lighting in your home to reduce risk of falls? Yes   ASSISTIVE DEVICES UTILIZED TO PREVENT FALLS:  Life alert? No  Use of a cane, walker or w/c? No  Grab bars in the bathroom? No  Shower chair or bench in shower? No  Elevated toilet seat or a handicapped toilet? No   TIMED UP AND GO:  Was the test performed? No .   Cognitive Function:        05/31/2022    3:16 PM  6CIT Screen  What Year? 0 points  What month? 0 points  What time? 0 points  Count back from 20 0 points  Months in reverse 4 points  Repeat phrase 0 points  Total Score 4 points    Immunizations Immunization History  Administered Date(s) Administered   Fluad Quad(high Dose 65+) 05/29/2019, 07/29/2020, 06/07/2021   Influenza Split 07/08/2011, 07/09/2012   Influenza, High Dose Seasonal PF 06/23/2017, 05/24/2018, 06/19/2018   Influenza,inj,Quad  PF,6+ Mos 05/28/2013, 07/08/2014, 07/23/2015, 07/06/2016   Influenza-Unspecified 05/21/2019, 06/07/2021   PFIZER(Purple Top)SARS-COV-2 Vaccination 11/03/2019, 11/25/2019, 09/25/2020   Pfizer Covid-19 Vaccine Bivalent Booster 24yr & up 06/07/2021   Pneumococcal Conjugate-13 10/21/2014, 08/09/2016   Pneumococcal Polysaccharide-23 04/17/2012, 08/09/2017   Tdap 09/20/2011, 04/17/2012   Zoster Recombinat (Shingrix) 09/25/2018, 03/11/2019   Zoster, Live 01/25/2011    TDAP status: Due, Education has been provided regarding the importance of this vaccine. Advised may receive this vaccine at local pharmacy or Health Dept. Aware to provide a copy of the vaccination record if obtained from local pharmacy or Health Dept. Verbalized acceptance and understanding.  Flu Vaccine status: Due, Education has been provided regarding the importance of this vaccine. Advised may receive this vaccine at local pharmacy or Health Dept. Aware to provide a copy of the vaccination record if obtained from local pharmacy or Health Dept. Verbalized acceptance and understanding.  Pneumococcal vaccine status: Up to date  Covid-19 vaccine status: Completed vaccines  Qualifies for Shingles Vaccine? Yes   Zostavax completed Yes   Shingrix Completed?: Yes  Screening Tests Health Maintenance  Topic Date Due   COVID-19 Vaccine (5 - Pfizer risk series) 08/02/2021   TETANUS/TDAP  04/17/2022   INFLUENZA VACCINE  04/19/2022   COLONOSCOPY (Pts 45-488yrInsurance coverage will need to be confirmed)  05/21/2023   Pneumonia Vaccine 6540Years old  Completed   Hepatitis C Screening  Completed   Zoster Vaccines- Shingrix  Completed   HPV VACCINES  Aged Out    Health Maintenance  Health Maintenance Due  Topic Date Due   COVID-19 Vaccine (5 - Pfizer risk series) 08/02/2021   TETANUS/TDAP  04/17/2022   INFLUENZA VACCINE  04/19/2022    Colorectal cancer screening: Type of screening: Colonoscopy. Completed 05/20/20. Repeat every  3 years   Additional Screening:  Hepatitis C Screening:  Completed 01/05/16  Vision Screening: Recommended annual ophthalmology exams for early detection of glaucoma and other disorders of the eye. Is the patient up to date with their annual eye exam?  Yes  Who is the provider or what is the name of the office in which the patient attends [facility] exams[facility] If pt is not established with  a provider, would they like to be referred to a provider to establish care? No .   Dental Screening: Recommended annual dental exams for proper oral hygiene  Community Resource Referral / Chronic Care Management: CRR required this visit?  No   CCM required this visit?  No      Plan:     I have personally reviewed and noted the following in the patient's chart:   Medical and social history Use of alcohol, tobacco or illicit drugs  Current medications and supplements including opioid prescriptions. Patient is not currently taking opioid prescriptions. Functional ability and status Nutritional status Physical activity Advanced directives List of other physicians Hospitalizations, surgeries, and ER visits in previous 12 months Vitals Screenings to include cognitive, depression, and falls Referrals and appointments  In addition, I have reviewed and discussed with patient certain preventive protocols, quality metrics, and best practice recommendations. A written personalized care plan for preventive services as well as general preventive health recommendations were provided to patient.     Willette Brace, LPN   6/76/7209   Nurse Notes: none

## 2022-06-13 ENCOUNTER — Encounter: Payer: Self-pay | Admitting: *Deleted

## 2022-06-13 ENCOUNTER — Telehealth: Payer: Self-pay | Admitting: *Deleted

## 2022-06-13 NOTE — Patient Outreach (Signed)
  Care Coordination   Initial Visit Note   06/13/2022 Name: Chris Welch MRN: [id number] DOB: Dec 21, 1945  Chris Welch is a 76 y.o. year old male who sees Copland, Gay Filler, MD for primary care. I spoke with  Chris Welch by phone today.  What matters to the patients health and wellness today?  "I am doing really well and not having any problems; I play golf about 3 times a week and I take my medicines the way I am supposed to; I tolerated the hernia surgery without any problems whatever, I am back to my normal self/ routine.... I never did have to see the dermatologist, because the medicine that Dr. Lorelei Pont gave me cleared it up.  I will get my flu vaccine soon"  No further or ongoing care coordination needs identified today    Goals Addressed             This Visit's Progress    COMPLETED: Care Coordination Activities: No further follow up required   On track    Care Coordination Interventions:  Evaluation of current treatment plan related to rash; inguinal hernia and patient's adherence to plan as established by provider Advised patient to provide appropriate vaccination information to provider or CM team member at next visit Advised patient to consider scheduling annual PCP provider appointment- has not had PCP office visit since 07/26/21- he reports he will do Provided education to patient re: basic indications and timing of recommended vaccines:  flu, COVID booster, pneumonia, etc- he reports he will obtain flu vaccine soon; provided education around basic infection prevention practices in light of upcoming 2023-24 flu/ winter season Reviewed medications with patient and discussed adherence- he reports excellent adherence to medications Reviewed scheduled/upcoming provider appointments including none noted Assessed social determinant of health barriers Reviewed recent visits- he confirms that he tolerated inguinal hernia surgery without any complications/  problems; reports rash "has completely resolved" with the medication provided by PCP; confirmed he attended Medicare Annual Wellness Visit 05/31/22         SDOH assessments and interventions completed:  Yes  SDOH Interventions Today    Flowsheet Row Most Recent Value  SDOH Interventions   Food Insecurity Interventions Intervention Not Indicated  Transportation Interventions Intervention Not Indicated  [drives self]       Care Coordination Interventions Activated:  Yes  Care Coordination Interventions:  Yes, provided   Follow up plan: No further intervention required.   Encounter Outcome:  Pt. Visit Completed   Oneta Rack, RN, BSN, CCRN Alumnus RN CM Care Coordination/ Transition of [place] Management [phone]: direct office

## 2022-06-13 NOTE — Patient Instructions (Signed)
Visit Information  Thank you for taking time to visit with me today. Please don't hesitate to contact me if I can be of assistance to you.   Following are the goals we discussed today:   Goals Addressed             This Visit's Progress    COMPLETED: Care Coordination Activities: No further follow up required   On track    Care Coordination Interventions:  Evaluation of current treatment plan related to rash; inguinal hernia and patient's adherence to plan as established by provider Advised patient to provide appropriate vaccination information to provider or CM team member at next visit Advised patient to consider scheduling annual PCP provider appointment- has not had PCP office visit since 07/26/21- he reports he will do Provided education to patient re: basic indications and timing of recommended vaccines:  flu, COVID booster, pneumonia, etc- he reports he will obtain flu vaccine soon; provided education around basic infection prevention practices in light of upcoming 2023-24 flu/ winter season Reviewed medications with patient and discussed adherence- he reports excellent adherence to medications Reviewed scheduled/upcoming provider appointments including none noted Assessed social determinant of health barriers Reviewed recent visits- he confirms that he tolerated inguinal hernia surgery without any complications/ problems; reports rash "has completely resolved" with the medication provided by PCP; confirmed he attended Medicare Annual Wellness Visit 05/31/22         If you are experiencing a Mental Health or [place] or need someone to talk to, please  call the Suicide and Crisis Lifeline: 988 call the [place] National Suicide Prevention Lifeline: (601)746-1001 or TTY: 848-001-2038 TTY (504)554-9417) to talk to a trained counselor call 1-800-273-TALK (toll free, 24 hour hotline) go to [facility] [address], [place]  [phone]) call the [place]: [phone] call 911   Patient verbalizes understanding of instructions and care plan provided today and agrees to view in [place]. Active MyChart status and patient understanding of how to access instructions and care plan via MyChart confirmed with patient.     No further follow up required: no further/ ongoing care coordination needs identified today  Oneta Rack, RN, BSN, CCRN Alumnus RN CM Care Coordination/ Transition of [place] Management [phone]: direct office

## 2022-06-23 DIAGNOSIS — M5411 Radiculopathy, occipito-atlanto-axial region: Secondary | ICD-10-CM | POA: Diagnosis not present

## 2022-06-23 DIAGNOSIS — M9901 Segmental and somatic dysfunction of cervical region: Secondary | ICD-10-CM | POA: Diagnosis not present

## 2022-06-29 DIAGNOSIS — M9901 Segmental and somatic dysfunction of cervical region: Secondary | ICD-10-CM | POA: Diagnosis not present

## 2022-06-29 DIAGNOSIS — M5411 Radiculopathy, occipito-atlanto-axial region: Secondary | ICD-10-CM | POA: Diagnosis not present

## 2022-07-06 DIAGNOSIS — M9901 Segmental and somatic dysfunction of cervical region: Secondary | ICD-10-CM | POA: Diagnosis not present

## 2022-07-06 DIAGNOSIS — M5411 Radiculopathy, occipito-atlanto-axial region: Secondary | ICD-10-CM | POA: Diagnosis not present

## 2022-07-20 DIAGNOSIS — M5411 Radiculopathy, occipito-atlanto-axial region: Secondary | ICD-10-CM | POA: Diagnosis not present

## 2022-07-20 DIAGNOSIS — M9901 Segmental and somatic dysfunction of cervical region: Secondary | ICD-10-CM | POA: Diagnosis not present

## 2022-08-03 DIAGNOSIS — M5411 Radiculopathy, occipito-atlanto-axial region: Secondary | ICD-10-CM | POA: Diagnosis not present

## 2022-08-03 DIAGNOSIS — M9901 Segmental and somatic dysfunction of cervical region: Secondary | ICD-10-CM | POA: Diagnosis not present

## 2022-08-15 ENCOUNTER — Other Ambulatory Visit (HOSPITAL_BASED_OUTPATIENT_CLINIC_OR_DEPARTMENT_OTHER): Payer: Self-pay

## 2022-08-15 DIAGNOSIS — L821 Other seborrheic keratosis: Secondary | ICD-10-CM | POA: Diagnosis not present

## 2022-08-15 DIAGNOSIS — L218 Other seborrheic dermatitis: Secondary | ICD-10-CM | POA: Diagnosis not present

## 2022-08-15 DIAGNOSIS — D485 Neoplasm of uncertain behavior of skin: Secondary | ICD-10-CM | POA: Diagnosis not present

## 2022-08-15 DIAGNOSIS — L814 Other melanin hyperpigmentation: Secondary | ICD-10-CM | POA: Diagnosis not present

## 2022-08-15 DIAGNOSIS — D0462 Carcinoma in situ of skin of left upper limb, including shoulder: Secondary | ICD-10-CM | POA: Diagnosis not present

## 2022-08-15 DIAGNOSIS — D692 Other nonthrombocytopenic purpura: Secondary | ICD-10-CM | POA: Diagnosis not present

## 2022-08-15 DIAGNOSIS — D2262 Melanocytic nevi of left upper limb, including shoulder: Secondary | ICD-10-CM | POA: Diagnosis not present

## 2022-08-15 DIAGNOSIS — L570 Actinic keratosis: Secondary | ICD-10-CM | POA: Diagnosis not present

## 2022-08-15 DIAGNOSIS — Z85828 Personal history of other malignant neoplasm of skin: Secondary | ICD-10-CM | POA: Diagnosis not present

## 2022-08-15 MED ORDER — KETOCONAZOLE 2 % EX SHAM
1.0000 | MEDICATED_SHAMPOO | Freq: Every day | CUTANEOUS | 3 refills | Status: DC
  Filled 2022-08-15: qty 120, 30d supply, fill #0
  Filled 2023-06-08: qty 120, 30d supply, fill #1

## 2022-08-17 DIAGNOSIS — M9901 Segmental and somatic dysfunction of cervical region: Secondary | ICD-10-CM | POA: Diagnosis not present

## 2022-08-17 DIAGNOSIS — M5411 Radiculopathy, occipito-atlanto-axial region: Secondary | ICD-10-CM | POA: Diagnosis not present

## 2022-11-30 DIAGNOSIS — L905 Scar conditions and fibrosis of skin: Secondary | ICD-10-CM | POA: Diagnosis not present

## 2022-11-30 DIAGNOSIS — Z85828 Personal history of other malignant neoplasm of skin: Secondary | ICD-10-CM | POA: Diagnosis not present

## 2022-11-30 DIAGNOSIS — L578 Other skin changes due to chronic exposure to nonionizing radiation: Secondary | ICD-10-CM | POA: Diagnosis not present

## 2022-11-30 DIAGNOSIS — L570 Actinic keratosis: Secondary | ICD-10-CM | POA: Diagnosis not present

## 2023-05-04 ENCOUNTER — Encounter (INDEPENDENT_AMBULATORY_CARE_PROVIDER_SITE_OTHER): Payer: Self-pay

## 2023-06-06 ENCOUNTER — Ambulatory Visit (INDEPENDENT_AMBULATORY_CARE_PROVIDER_SITE_OTHER): Payer: Medicare Other | Admitting: *Deleted

## 2023-06-06 VITALS — Ht 75.0 in | Wt 219.0 lb

## 2023-06-06 DIAGNOSIS — Z0000 Encounter for general adult medical examination without abnormal findings: Secondary | ICD-10-CM | POA: Diagnosis not present

## 2023-06-06 NOTE — Progress Notes (Signed)
Subjective:   Chris Welch is a 77 y.o. male who presents for Medicare Annual/Subsequent preventive examination.  Visit Complete: Virtual  I connected with  Chris Welch on 06/06/23 by a audio enabled telemedicine application and verified that I am speaking with the correct person using two identifiers.  Patient Location: Home  Provider Location: Office/Clinic  I discussed the limitations of evaluation and management by telemedicine. The patient expressed understanding and agreed to proceed.  Cardiac Risk Factors include: advanced age (>75men, >38 women);male gender;dyslipidemia;hypertension     Objective:   Vital Signs: Vital signs are patient reported.  Today's Vitals   06/06/23 0906  Weight: 219 lb (99.3 kg)  Height: 6\' 3"  (1.905 m)   Body mass index is 27.37 kg/m.     06/06/2023    9:07 AM 05/31/2022    3:15 PM 10/12/2021   11:01 AM 02/16/2021    7:04 AM 10/12/2020    7:28 AM 10/08/2020    4:10 PM 03/06/2020    8:26 AM  Advanced Directives  Does Patient Have a Medical Advance Directive? No No No No No No No  Would patient like information on creating a medical advance directive? No - Patient declined No - Patient declined No - Patient declined No - Patient declined No - Patient declined  No - Patient declined    Current Medications (verified) Outpatient Encounter Medications as of 06/06/2023  Medication Sig   albuterol (VENTOLIN HFA) 108 (90 Base) MCG/ACT inhaler Inhale 2 puffs into the lungs every 6 (six) hours as needed for wheezing or shortness of breath.   amLODipine (NORVASC) 5 MG tablet Take 1 tablet (5 mg total) by mouth daily.   aspirin EC 81 MG tablet Take 1 tablet by mouth twice daily for 2 weeks and then once daily for 2 weeks (Patient taking differently: Take 81 mg by mouth daily.)   buPROPion (WELLBUTRIN XL) 150 MG 24 hr tablet Take 3 tablets (450 mg total) by mouth daily. (Patient taking differently: Take 450 mg by mouth in the morning and at  bedtime.)   celecoxib (CELEBREX) 200 MG capsule Take 1 capsule by mouth twice a day   COVID-19 mRNA bivalent vaccine, Pfizer, injection Inject into the muscle.   ferrous sulfate 325 (65 FE) MG tablet Take 325 mg by mouth every Monday, Wednesday, and Friday.   finasteride (PROSCAR) 2.5 mg tablet Take 5 mg by mouth daily.   fluticasone (FLONASE) 50 MCG/ACT nasal spray Place 2 sprays into both nostrils daily. (Patient taking differently: Place 2 sprays into both nostrils daily as needed for allergies.)   glycopyrrolate (ROBINUL) 1 MG tablet Take 1 tablet (1 mg total) by mouth 3 (three) times daily. (Patient taking differently: Take 1 mg by mouth 2 (two) times daily.)   ketoconazole (NIZORAL) 2 % shampoo Apply 1 small Application topically daily.   losartan (COZAAR) 100 MG tablet Take 100 mg by mouth every morning.   meclizine (ANTIVERT) 25 MG tablet Take 1 tablet (25 mg total) by mouth 3 (three) times daily as needed for dizziness.   omeprazole (PRILOSEC) 20 MG capsule Take 20 mg by mouth every morning.   ondansetron (ZOFRAN) 4 MG tablet Take 1 tablet by mouth every six to eight hours as needed   pravastatin (PRAVACHOL) 20 MG tablet Take 1 tablet (20 mg total) by mouth daily.   tamsulosin (FLOMAX) 0.4 MG CAPS capsule SMARTSIG:1 By Mouth   terbinafine (LAMISIL) 250 MG tablet Take 1 tablet (250 mg total) by mouth  daily.   tiZANidine (ZANAFLEX) 2 MG tablet Take 1 tablet by mouth every 6 to 8 hours   triamcinolone cream (KENALOG) 0.1 % Apply 1 a small amount to affected area twice a day   triamcinolone cream (KENALOG) 0.5 % Apply 1 application topically 2 (two) times daily. Use as needed on rash spots   [DISCONTINUED] doxycycline (VIBRAMYCIN) 100 MG capsule Take 1 capsule (100 mg total) by mouth 2 (two) times daily.   No facility-administered encounter medications on file as of 06/06/2023.    Allergies (verified) Hydrocodone and Penicillins   History: Past Medical History:  Diagnosis Date    Adenomatous colon polyp 2009   Anemia    Anxiety    Aortic atherosclerosis (HCC)    Arthritis    "hips, knees" (01/27/2016)   Complication of anesthesia    bad dreams-OCC PASSES OUT AFTER ANETHESIA   Depression    Diverticulitis    Diverticulosis    Ear infection ear infection lasting past 2 months, still ongoing, pt on antibiotics & drops. 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   Right Cervical Paraganglioma   Vocal cord paralysis    Right   Past Surgical History:  Procedure Laterality Date   APPENDECTOMY     BACK SURGERY     LOWER   CAROTID BODY TUMOR EXCISION     CERVICAL PARAGANGLIOMA EXCISION Right 12/2006   CHOLECYSTECTOMY OPEN     CYST EXCISION Left 12/17/2019   Procedure: CYST EXCISION; EXCISION NAIL HORN REMNANT LEFT RING FINGER;  Surgeon: Cindee Salt, MD;  Location: [facility];  Service: Orthopedics;  Laterality: Left;  IV REGIONAL   CYSTOSCOPY WITH INSERTION OF UROLIFT N/A 10/12/2020   Procedure: CYSTOSCOPY WITH INSERTION OF UROLIFT;  Surgeon: Vanna Scotland, MD;  Location: [facility];  Service: Urology;  Laterality: N/A;   DEEP NECK LYMPH NODE BIOPSY / EXCISION     DIGIT NAIL REMOVAL Left 07/30/2019   Procedure: REMOVAL OF NAIL HORNS LEFT RING FINGER;  Surgeon: Cindee Salt, MD;  Location: [facility];  Service: Orthopedics;  Laterality: Left;  IV REGIONAL FOREARM BLOCK   ESOPHAGOGASTRODUODENOSCOPY (EGD) WITH ESOPHAGEAL DILATION      INGUINAL HERNIA REPAIR Right 1954   INGUINAL HERNIA REPAIR Left 10/15/2021   Procedure: OPEN LEFT INGUINAL HERNIA REPAIR;  Surgeon: Fritzi Mandes, MD;  Location: [facility];  Service: General;  Laterality: Left;   INSERTION OF MESH N/A 10/15/2021   Procedure: INSERTION OF MESH;  Surgeon: Fritzi Mandes, MD;  Location: [facility];  Service: General;  Laterality: N/A;   JOINT REPLACEMENT     KNEE ARTHROSCOPY Right    LUMBAR LAMINECTOMY/DECOMPRESSION MICRODISCECTOMY Left 03/09/2013   Procedure: LUMBAR LAMINECTOMY/DECOMPRESSION MICRODISCECTOMY 1 LEVEL;  Surgeon: Tia Alert, MD;  Location: [facility];  Service: Neurosurgery;  Laterality: Left;  Left lumbar  three-four extra-foraminal microdiscectomy   PATELLA ARTHROPLASTY Right    has had 5 surgeries right knee   TOTAL HIP ARTHROPLASTY Left 01/27/2016   TOTAL HIP ARTHROPLASTY Left 01/27/2016   Procedure: TOTAL HIP ARTHROPLASTY ANTERIOR APPROACH;  Surgeon: Gean Birchwood, MD;  Location: [facility];  Service: Orthopedics;  Laterality: Left;   UPPER GASTROINTESTINAL ENDOSCOPY     VOCAL CORD IMPLANT     Family History  Problem Relation Age of Onset   Arthritis Brother        back   Emphysema Brother    Cancer Brother        rare cancer-unknown type   Heart disease Mother    Emphysema Father        smoker   Colon cancer Neg Hx    Esophageal cancer Neg Hx    Rectal cancer Neg Hx    Stomach cancer Neg Hx    Social History   Socioeconomic History   Marital status: Married    Spouse name: Junious Dresser Wiesman   Number of children: 4   Years of education: 16   Highest education level: Not on file  Occupational History   Occupation: Machinist-retired 2015    Employer: [employer]    Comment: [employer]   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  Three children are grown.  Younger daughter will graduate from [employer] in 01/2013.  Both sons and oldest daughter are in [employer]. Exercises regularly.   His wife retired 12/2015 and is enjoying spending time in the yard, and with their family.   Social Determinants of Health   Financial Resource Strain: Low Risk  (06/06/2023)   Overall Financial Resource Strain (CARDIA)    Difficulty of Paying Living Expenses: Not hard at all  Food Insecurity: No Food Insecurity (06/06/2023)   Hunger Vital Sign    Worried About Running Out of Food in the Last Year: Never true    Ran Out of Food in the Last Year: Never true  Transportation Needs: No Transportation Needs (06/06/2023)   PRAPARE - Administrator, Civil Service (Medical): No    Lack of Transportation (Non-Medical): No  Physical Activity: Sufficiently Active (06/06/2023)   Exercise Vital Sign    Days of Exercise per Week: 7 days    Minutes of Exercise per Session: 30 min  Stress: Stress Concern Present (06/06/2023)   [employer] of Occupational Health - Occupational Stress Questionnaire    Feeling of Stress : Rather much  Social Connections: Moderately Isolated (06/06/2023)   Social Connection and Isolation Panel [NHANES]    Frequency of Communication with Friends and Family: More than three times a week    Frequency of Social Gatherings with Friends and Family: Three times a week    Attends Religious Services: Never    Active Member of Clubs or Organizations: No    Attends Banker Meetings: Never    Marital Status: Married  Tobacco Counseling Counseling given: Not Answered   Clinical Intake:  Pre-visit preparation completed: Yes  Pain : No/denies pain  BMI - recorded: 27.37 Nutritional Status:  BMI 25 -29 Overweight Nutritional Risks: None Diabetes: No  How often do you need to have someone help you when you read instructions, pamphlets, or other written materials from your doctor or pharmacy?: 1 - Never  Interpreter Needed?: No  Information entered by :: Donne Anon, CMA   Activities of Daily Living    06/06/2023    9:02 AM  In your present state of health, do you have any difficulty performing the following activities:  Hearing? 1  Comment wears hearing aids  Vision? 0  Difficulty concentrating or making decisions? 0  Walking or climbing stairs? 0  Dressing or bathing? 0  Doing errands, shopping? 0  Preparing Food and eating ? N  Using the Toilet? N  In the past six months, have you accidently leaked urine? N  Do you have problems with loss of bowel control? N  Managing your Medications? N  Managing your Finances? N  Housekeeping or managing your Housekeeping? N    Patient Care Team: Copland, Gwenlyn Found, MD as PCP - General (Family Medicine) Jake Bathe, MD as PCP - Cardiology (Cardiology) Jake Bathe, MD as Consulting Physician (Cardiology)  Indicate any recent Medical Services you may have received from other than [facility] providers in the past year (date may be approximate).     Assessment:   This is a routine wellness examination for Chris Welch.  Hearing/Vision screen No results found.   Goals Addressed   None    Depression Screen    06/06/2023    9:09 AM 05/31/2022    3:13 PM 06/07/2021    1:17 PM 03/06/2020    8:34 AM 01/05/2016    1:22 PM 06/04/2015    4:44 PM 11/26/2013    8:10 AM  PHQ 2/9 Scores  PHQ - 2 Score 0 0 0 1 2 0 4  PHQ- 9 Score     16  4    Fall Risk    06/06/2023    9:06 AM 05/31/2022    3:15 PM 06/07/2021    1:17 PM 03/06/2020    8:34 AM 05/24/2018    9:09 AM  Fall Risk   Falls in the past year? 0 0 0 0 No  Number falls in past yr: 0 0 0 0   Injury with Fall? 0 0 0 0   Risk for fall due to : No Fall Risks Impaired vision      Follow up Falls evaluation completed Falls prevention discussed  Education provided;Falls prevention discussed     MEDICARE RISK AT HOME: Medicare Risk at Home Any stairs in or around the home?: Yes If so, are there any without handrails?: No Home free of loose throw rugs in walkways, pet beds, electrical cords, etc?: Yes Adequate lighting in your home to reduce risk of falls?: Yes Life alert?: No Use of a cane, walker or w/c?: No Grab bars in the bathroom?: No Shower chair or bench in shower?: Yes (built in seat) Elevated toilet seat or a handicapped toilet?: Yes (comfort height toilet)  TIMED UP AND GO:  Was the test performed?  No    Cognitive Function:        06/06/2023    9:12 AM 05/31/2022    3:16 PM  6CIT Screen  What Year? 0 points 0 points  What month? 0 points  0 points  What time? 0 points 0 points  Count back from 20 0 points 0 points  Months in reverse 0 points 4 points  Repeat phrase 0 points 0 points  Total Score 0 points 4 points    Immunizations Immunization History  Administered Date(s) Administered   Covid-19, Mrna,Vaccine(Spikevax)86yrs and older 11/02/2022   Fluad Quad(high Dose 65+) 05/29/2019, 07/29/2020, 06/07/2021   Influenza Split 07/08/2011, 07/09/2012   Influenza, High Dose Seasonal PF 06/23/2017, 05/24/2018, 06/19/2018   Influenza,inj,Quad PF,6+ Mos 05/28/2013, 07/08/2014, 07/23/2015, 07/06/2016   Influenza-Unspecified 05/21/2019, 06/07/2021   PFIZER(Purple Top)SARS-COV-2 Vaccination 11/03/2019, 11/25/2019, 09/25/2020   Pfizer Covid-19 Vaccine Bivalent Booster 59yrs & up 06/07/2021   Pneumococcal Conjugate-13 10/21/2014, 08/09/2016   Pneumococcal Polysaccharide-23 04/17/2012, 08/09/2017   Rsv, Bivalent, Protein Subunit Rsvpref,pf Verdis Frederickson) 11/02/2022   Tdap 09/20/2011, 04/17/2012, 05/03/2023   Zoster Recombinant(Shingrix) 09/25/2018, 03/11/2019   Zoster, Live 01/25/2011    TDAP status: Due, Education has been provided regarding the  importance of this vaccine. Advised may receive this vaccine at local pharmacy or Health Dept. Aware to provide a copy of the vaccination record if obtained from local pharmacy or Health Dept. Verbalized acceptance and understanding.  Flu Vaccine status: Due, Education has been provided regarding the importance of this vaccine. Advised may receive this vaccine at local pharmacy or Health Dept. Aware to provide a copy of the vaccination record if obtained from local pharmacy or Health Dept. Verbalized acceptance and understanding.  Pneumococcal vaccine status: Up to date  Covid-19 vaccine status: Information provided on how to obtain vaccines.   Qualifies for Shingles Vaccine? Yes   Zostavax completed Yes   Shingrix Completed?: Yes  Screening Tests Health Maintenance  Topic Date Due   INFLUENZA VACCINE  04/20/2023   COVID-19 Vaccine (6 - 2023-24 season) 05/21/2023   Medicare Annual Wellness (AWV)  06/01/2023   Colonoscopy  05/30/2026   DTaP/Tdap/Td (4 - Td or Tdap) 05/02/2033   Pneumonia Vaccine 50+ Years old  Completed   Hepatitis C Screening  Completed   Zoster Vaccines- Shingrix  Completed   HPV VACCINES  Aged Out    Health Maintenance  Health Maintenance Due  Topic Date Due   INFLUENZA VACCINE  04/20/2023   COVID-19 Vaccine (6 - 2023-24 season) 05/21/2023   Medicare Annual Wellness (AWV)  06/01/2023    Colorectal cancer screening: Type of screening: Colonoscopy. Completed 05/31/23. Repeat every 3 years  Lung Cancer Screening: (Low Dose CT Chest recommended if Age 36-80 years, 20 pack-year currently smoking OR have quit w/in 15years.) does not qualify.   Additional Screening:  Hepatitis C Screening: does qualify; Completed 01/05/16  Vision Screening: Recommended annual ophthalmology exams for early detection of glaucoma and other disorders of the eye. Is the patient up to date with their annual eye exam?  Yes  Who is the provider or what is the name of the [facility] in which  the patient [facility] [place] If pt is not established with a provider, would they like to be referred to a provider to establish care? No .   Dental Screening: Recommended annual dental exams for proper oral hygiene  Diabetic Foot Exam: N/a  Community Resource Referral / Chronic Care Management: CRR required this visit?  No   CCM required this visit?  No     Plan:     I have personally reviewed and noted the following in the patient's chart:   Medical and social history Use of alcohol, tobacco or illicit drugs  Current medications and supplements including opioid prescriptions. Patient is not currently taking opioid prescriptions. Functional ability and status Nutritional status Physical activity Advanced directives List of other physicians Hospitalizations, surgeries, and ER visits in previous 12 months Vitals Screenings to include cognitive, depression, and falls Referrals and appointments  In addition, I have reviewed and discussed with patient certain preventive protocols, quality metrics, and best practice recommendations. A written personalized care plan for preventive services as well as general preventive health recommendations were provided to patient.     Donne Anon, CMA   06/06/2023   After Visit Summary: (MyChart) Due to this being a telephonic visit, the after visit summary with patients personalized plan was offered to patient via MyChart   Nurse Notes: None

## 2023-06-06 NOTE — Patient Instructions (Signed)
[name] , Thank you for taking time to come for your Medicare Wellness Visit. I appreciate your ongoing commitment to your health goals. Please review the following plan we discussed and let me know if I can assist you in the future.     This is a list of the screening recommended for you and due dates:  Health Maintenance  Topic Date Due   COVID-19 Vaccine (6 - 2023-24 season) 05/21/2023   Flu Shot  12/18/2023*   Medicare Annual Wellness Visit  06/05/2024   Colon Cancer Screening  05/30/2026   DTaP/Tdap/Td vaccine (4 - Td or Tdap) 05/02/2033   Pneumonia Vaccine  Completed   Hepatitis C Screening  Completed   Zoster (Shingles) Vaccine  Completed   HPV Vaccine  Aged Out  *Topic was postponed. The date shown is not the original due date.    Next appointment: Follow up in one year for your annual wellness visit.   Preventive Care 77 Years and Older, Male Preventive care refers to lifestyle choices and visits with your health care provider that can promote health and wellness. What does preventive care include? A yearly physical exam. This is also called an annual well check. Dental exams once or twice a year. Routine eye exams. Ask your health care provider how often you should have your eyes checked. Personal lifestyle choices, including: Daily care of your teeth and gums. Regular physical activity. Eating a healthy diet. Avoiding tobacco and drug use. Limiting alcohol use. Practicing safe sex. Taking low doses of aspirin every day. Taking vitamin and mineral supplements as recommended by your health care provider. What happens during an annual well check? The services and screenings done by your health care provider during your annual well check will depend on your age, overall health, lifestyle risk factors, and family history of disease. Counseling  Your health care provider may ask you questions about your: Alcohol use. Tobacco use. Drug use. Emotional  well-being. Home and relationship well-being. Sexual activity. Eating habits. History of falls. Memory and ability to understand (cognition). Work and work Astronomer. Screening  You may have the following tests or measurements: Height, weight, and BMI. Blood pressure. Lipid and cholesterol levels. These may be checked every 5 years, or more frequently if you are over 19 years old. Skin check. Lung cancer screening. You may have this screening every year starting at age 83 if you have a 30-pack-year history of smoking and currently smoke or have quit within the past 15 years. Fecal occult blood test (FOBT) of the stool. You may have this test every year starting at age 29. Flexible sigmoidoscopy or colonoscopy. You may have a sigmoidoscopy every 5 years or a colonoscopy every 10 years starting at age 4. Prostate cancer screening. Recommendations will vary depending on your family history and other risks. Hepatitis C blood test. Hepatitis B blood test. Sexually transmitted disease (STD) testing. Diabetes screening. This is done by checking your blood sugar (glucose) after you have not eaten for a while (fasting). You may have this done every 1-3 years. Abdominal aortic aneurysm (AAA) screening. You may need this if you are a current or former smoker. Osteoporosis. You may be screened starting at age 17 if you are at high risk. Talk with your health care provider about your test results, treatment options, and if necessary, the need for more tests. Vaccines  Your health care provider may recommend certain vaccines, such as: Influenza vaccine. This is recommended every year. Tetanus, diphtheria, and acellular  pertussis (Tdap, Td) vaccine. You may need a Td booster every 10 years. Zoster vaccine. You may need this after age 37. Pneumococcal 13-valent conjugate (PCV13) vaccine. One dose is recommended after age 66. Pneumococcal polysaccharide (PPSV23) vaccine. One dose is recommended after  age 57. Talk to your health care provider about which screenings and vaccines you need and how often you need them. This information is not intended to replace advice given to you by your health care provider. Make sure you discuss any questions you have with your health care provider. Document Released: 10/02/2015 Document Revised: 05/25/2016 Document Reviewed: 07/07/2015 Elsevier Interactive Patient Education  2017 [employer].  Fall Prevention in the Home Falls can cause injuries. They can happen to people of all ages. There are many things you can do to make your home safe and to help prevent falls. What can I do on the outside of my home? Regularly fix the edges of walkways and driveways and fix any cracks. Remove anything that might make you trip as you walk through a door, such as a raised step or threshold. Trim any bushes or trees on the path to your home. Use bright outdoor lighting. Clear any walking paths of anything that might make someone trip, such as rocks or tools. Regularly check to see if handrails are loose or broken. Make sure that both sides of any steps have handrails. Any raised decks and porches should have guardrails on the edges. Have any leaves, snow, or ice cleared regularly. Use sand or salt on walking paths during winter. Clean up any spills in your garage right away. This includes oil or grease spills. What can I do in the bathroom? Use night lights. Install grab bars by the toilet and in the tub and shower. Do not use towel bars as grab bars. Use non-skid mats or decals in the tub or shower. If you need to sit down in the shower, use a plastic, non-slip stool. Keep the floor dry. Clean up any water that spills on the floor as soon as it happens. Remove soap buildup in the tub or shower regularly. Attach bath mats securely with double-sided non-slip rug tape. Do not have throw rugs and other things on the floor that can make you trip. What can I do in the  bedroom? Use night lights. Make sure that you have a light by your bed that is easy to reach. Do not use any sheets or blankets that are too big for your bed. They should not hang down onto the floor. Have a firm chair that has side arms. You can use this for support while you get dressed. Do not have throw rugs and other things on the floor that can make you trip. What can I do in the kitchen? Clean up any spills right away. Avoid walking on wet floors. Keep items that you use a lot in easy-to-reach places. If you need to reach something above you, use a strong step stool that has a grab bar. Keep electrical cords out of the way. Do not use floor polish or wax that makes floors slippery. If you must use wax, use non-skid floor wax. Do not have throw rugs and other things on the floor that can make you trip. What can I do with my stairs? Do not leave any items on the stairs. Make sure that there are handrails on both sides of the stairs and use them. Fix handrails that are broken or loose. Make sure that handrails  are as long as the stairways. Check any carpeting to make sure that it is firmly attached to the stairs. Fix any carpet that is loose or worn. Avoid having throw rugs at the top or bottom of the stairs. If you do have throw rugs, attach them to the floor with carpet tape. Make sure that you have a light switch at the top of the stairs and the bottom of the stairs. If you do not have them, ask someone to add them for you. What else can I do to help prevent falls? Wear shoes that: Do not have high heels. Have rubber bottoms. Are comfortable and fit you well. Are closed at the toe. Do not wear sandals. If you use a stepladder: Make sure that it is fully opened. Do not climb a closed stepladder. Make sure that both sides of the stepladder are locked into place. Ask someone to hold it for you, if possible. Clearly mark and make sure that you can see: Any grab bars or  handrails. First and last steps. Where the edge of each step is. Use tools that help you move around (mobility aids) if they are needed. These include: Canes. Walkers. Scooters. Crutches. Turn on the lights when you go into a dark area. Replace any light bulbs as soon as they burn out. Set up your furniture so you have a clear path. Avoid moving your furniture around. If any of your floors are uneven, fix them. If there are any pets around you, be aware of where they are. Review your medicines with your doctor. Some medicines can make you feel dizzy. This can increase your chance of falling. Ask your doctor what other things that you can do to help prevent falls. This information is not intended to replace advice given to you by your health care provider. Make sure you discuss any questions you have with your health care provider. Document Released: 07/02/2009 Document Revised: 02/11/2016 Document Reviewed: 10/10/2014 Elsevier Interactive Patient Education  2017 [employer].

## 2023-06-07 DIAGNOSIS — L814 Other melanin hyperpigmentation: Secondary | ICD-10-CM | POA: Diagnosis not present

## 2023-06-07 DIAGNOSIS — D692 Other nonthrombocytopenic purpura: Secondary | ICD-10-CM | POA: Diagnosis not present

## 2023-06-07 DIAGNOSIS — L821 Other seborrheic keratosis: Secondary | ICD-10-CM | POA: Diagnosis not present

## 2023-06-07 DIAGNOSIS — L570 Actinic keratosis: Secondary | ICD-10-CM | POA: Diagnosis not present

## 2023-06-07 DIAGNOSIS — Z85828 Personal history of other malignant neoplasm of skin: Secondary | ICD-10-CM | POA: Diagnosis not present

## 2023-06-08 ENCOUNTER — Other Ambulatory Visit (HOSPITAL_BASED_OUTPATIENT_CLINIC_OR_DEPARTMENT_OTHER): Payer: Self-pay

## 2023-06-21 ENCOUNTER — Ambulatory Visit: Payer: No Typology Code available for payment source | Attending: Otolaryngology | Admitting: Speech Pathology

## 2023-06-21 ENCOUNTER — Encounter: Payer: Self-pay | Admitting: Speech Pathology

## 2023-06-21 ENCOUNTER — Other Ambulatory Visit: Payer: Self-pay

## 2023-06-21 DIAGNOSIS — R498 Other voice and resonance disorders: Secondary | ICD-10-CM | POA: Diagnosis present

## 2023-06-21 DIAGNOSIS — R1312 Dysphagia, oropharyngeal phase: Secondary | ICD-10-CM | POA: Insufficient documentation

## 2023-06-21 NOTE — Patient Instructions (Addendum)
   It's probably time to have your vocal folds check since the augmentation by a laryngologist (and ENT who specializes in the voice box and upper air way, like Dr. Delford Field). In [place], Dr. Ashok Croon is our local laryngologist with [facility] [phone]. I will ask Dr. Gerilyn Pilgrim for a referral, but you will also likely have to follow up to remind her  You don't feel when you need to swallow your saliva and drainage  We swallow 1x a minute - you can be mindful to swallow frequently even if you don't feel like you have to  This is why you get coughing on your drainage or saliva - swallow, swallow, swallow  Before you talk, swallow  When you need to project, take a good breath and talk on that air rather than straining from your throat  Practice 10 abdominal breaths  Straw phonation  2 minutes prior to PHoRTE and throughout the day and before any prolonged speaking  PHoRTE - twice a day   10 Loud AH's as loud as you can and as long as you can       To help coach you at home with your loud AH!!       YouTube: Speech Therapy Practice - Sustained Phonation /ah/ 10 times - Phonatory                  Resistance Strengthening Exercises Speech Therapy       [email or web address]   2. 10 Pitch glides up, 10 pitch glides down   3. 10 sentences in loud high pitch voice, like you are calling your neighbor over the phone  4. 10 sentences in loud low authoritative pitch, like you are the boss  Use a good belly breath before each exercise- feel your abs contract in as you use your voice  Use a good abdominal breath to power your voice when talking

## 2023-06-21 NOTE — Therapy (Addendum)
OUTPATIENT SPEECH LANGUAGE PATHOLOGY VOICE EVALUATION   Patient Name: Chris Welch MRN: [id number] DOB:[birth year], 77 y.o., male Today's Date: 06/21/2023  PCP: Pearline Cables, MD REFERRING PROVIDER: Dorna Leitz, MD  END OF SESSION:  End of Session - 06/21/23 1648     Visit Number 1    Number of Visits 17    Date for SLP Re-Evaluation 08/16/23    SLP Start Time 1015    SLP Stop Time  1100    SLP Time Calculation (min) 45 min    Activity Tolerance Patient tolerated treatment well             Past Medical History:  Diagnosis Date   Adenomatous colon polyp 2009   Anemia    Anxiety    Aortic atherosclerosis (HCC)    Arthritis    "hips, knees" (01/27/2016)   Complication of anesthesia    bad dreams-OCC PASSES OUT AFTER ANETHESIA   Depression    Diverticulitis    Diverticulosis    Ear infection ear infection lasting past 2 months, still ongoing, pt on antibiotics & drops. 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   Right Cervical Paraganglioma   Vocal cord paralysis    Right   Past Surgical History:  Procedure Laterality Date   APPENDECTOMY     BACK SURGERY     LOWER   CAROTID BODY TUMOR EXCISION     CERVICAL PARAGANGLIOMA EXCISION Right 12/2006   CHOLECYSTECTOMY OPEN     CYST EXCISION Left 12/17/2019   Procedure: CYST EXCISION; EXCISION NAIL HORN REMNANT LEFT RING FINGER;  Surgeon: Cindee Salt, MD;  Location: [facility];  Service:  Orthopedics;  Laterality: Left;  IV REGIONAL   CYSTOSCOPY WITH INSERTION OF UROLIFT N/A 10/12/2020   Procedure: CYSTOSCOPY WITH INSERTION OF UROLIFT;  Surgeon: Vanna Scotland, MD;  Location: [facility];  Service: Urology;  Laterality: N/A;   DEEP NECK LYMPH NODE BIOPSY / EXCISION     DIGIT NAIL REMOVAL Left 07/30/2019   Procedure: REMOVAL OF NAIL HORNS LEFT RING FINGER;  Surgeon: Cindee Salt, MD;  Location: [facility];  Service: Orthopedics;  Laterality: Left;  IV REGIONAL FOREARM BLOCK   ESOPHAGOGASTRODUODENOSCOPY (EGD) WITH ESOPHAGEAL DILATION     INGUINAL HERNIA REPAIR Right 1954   INGUINAL HERNIA REPAIR Left 10/15/2021   Procedure: OPEN LEFT INGUINAL HERNIA REPAIR;  Surgeon: Fritzi Mandes, MD;  Location: [facility];  Service: General;  Laterality: Left;   INSERTION OF MESH N/A 10/15/2021   Procedure: INSERTION OF MESH;  Surgeon: Fritzi Mandes, MD;  Location: [facility] OR;  Service: General;  Laterality: N/A;   JOINT REPLACEMENT     KNEE ARTHROSCOPY Right    LUMBAR LAMINECTOMY/DECOMPRESSION MICRODISCECTOMY Left 03/09/2013   Procedure: LUMBAR LAMINECTOMY/DECOMPRESSION MICRODISCECTOMY 1 LEVEL;  Surgeon: Tia Alert, MD;  Location:  [facility];  Service: Neurosurgery;  Laterality: Left;  Left lumbar three-four extra-foraminal microdiscectomy   PATELLA ARTHROPLASTY Right    has had 5 surgeries right knee   TOTAL HIP ARTHROPLASTY Left 01/27/2016   TOTAL HIP ARTHROPLASTY Left 01/27/2016   Procedure: TOTAL HIP ARTHROPLASTY ANTERIOR APPROACH;  Surgeon: Gean Birchwood, MD;  Location: [facility] OR;  Service: Orthopedics;  Laterality: Left;   UPPER GASTROINTESTINAL ENDOSCOPY     VOCAL CORD IMPLANT     Patient Active Problem List   Diagnosis Date Noted   Degenerative joint disease (DJD) of hip 01/05/2016   Atypical chest pain 04/22/2014   Gastric reflux with aspiration 03/04/2014   Carpal tunnel syndrome 11/26/2013   HTN (hypertension) 11/26/2013   AR (allergic rhinitis) 11/26/2013   Low sodium  levels 03/13/2013   BPH associated with nocturia 08/21/2012   Hyperlipidemia 08/21/2012   Recurrent aspiration bronchitis/pneumonia 10/05/2011   Diverticulosis 10/05/2011   Hearing loss of both ears 10/05/2011   Depression 10/05/2011   Vocal cord paralysis 01/17/2007    Onset date: 06/08/2023 (referral date)  REFERRING DIAG: J38.00 (ICD-10-CM) - Vocal cord paralysis  THERAPY DIAG:  Other voice and resonance disorders - Plan: SLP modified barium swallow, SLP plan of care cert/re-cert  Dysphagia, oropharyngeal phase - Plan: SLP modified barium swallow, SLP plan of care cert/re-cert  Rationale for Evaluation and Treatment: Rehabilitation  SUBJECTIVE:   SUBJECTIVE STATEMENT: "I don't know why she sent me here" Pt accompanied by: self  PERTINENT HISTORY: Chris Welch" Chris Welch is referred to ST by Dr. [name][facility]. Chris Welch is following him for hearing loss and chronic otitis externa. Chris Welch had Right carotid tumor rescetion in 2008, resulting in right vocal fold paralysis. At that time, he had vocal fold augmentation by Chris Welch at [facility]. He reports no laryngoscopy since 2008. Chris Welch reported that Chris Welch informed him he may need a revision in the future. Chris Welch reports inconsistent voice quality, variable hoarseness and difficulty being heard in noisy situations. He reports he occasionally has food "get stuck" in right side of his pharynx and he coughs and brings it back up. He did have EDG recently with stretching which improved his swallowing. Denies difficulty with pills, recurrent respiratory illness or pna and no weight loss. Chris Welch endorses "getting choked" on "nasal drip" and pooling of saliva in his throat as well as drool. He is numb on right side of his tongue and neck.  PAIN:  Are you having pain? No  FALLS: Has patient fallen in last 6 months? No,  LIVING ENVIRONMENT: Lives with: lives with their spouse Lives in: House/apartment  PLOF:Level of  assistance: Independent with ADLs, Independent with IADLs Employment: Retired  PATIENT GOALS: "to be heard"  OBJECTIVE:  Note: Objective measures were completed at Evaluation unless otherwise noted.   COGNITION: Overall cognitive status: Within functional limits for tasks assessed  SOCIAL HISTORY: Occupation: Retired Counsellor intake: optimal Caffeine/alcohol intake: minimal Daily voice use: minimal  PERCEPTUAL VOICE ASSESSMENT: Voice quality: hoarse, diplophonia, and   Vocal abuse: habitual throat clearing Resonance: normal Respiratory function: thoracic breathing  OBJECTIVE VOICE ASSESSMENT: Maximum phonation time for sustained "ah": 14.61 Conversational pitch average: 155.6 Hz Conversational pitch range: 110-220 Hz Conversational loudness average: 74dB Conversational loudness range: 72-78 dB S/z ratio: .94 (Suggestive of dysfunction >1.0)  PATIENT REPORTED OUTCOME MEASURES (PROM): V-RQOL: 23 - he rated a "4" or a lot of problem being depressed because of his voice; he rated a "3" or moderate problem being heard  in noisy places, being frustrated because of his voice, trouble using the phone because of his voice, and avoiding going out socially because of his voice. He rated a "2" or small problem having to repeat himself and being less outgoing because of his voice.   Oral Motor Exam:  Tongue reduced sensation right side, reduced coordination, atrophy Neck reduced sensation right Cough strong  TODAY'S TREATMENT:                                                                                                                                         DATE:   06/21/23 (eval day): Initiated training to use breath support rather than vocal strain to be heard over noise or across a room. Initiated training in throat clear alternatives and more frequent swallowing to reduce drool and coughing on perceived pooling of secretions in his pharynx. Reviewed basic swallow precautions, which  he is following. Educated re: need to see a Scientist, forensic to assess status of vocal folds and prior vocal fold augmentation since he has not had laryngoscopy since 2008. Provided name  & number of Chris Welch. Will request referral from Chris Welch. Initiated training in HEP, PhoRTE, for dysphonia. Will modify as needed pending laryngology consult.   PATIENT EDUCATION: Education details: See Today's Treatment, See Patient Instructions, vocal hygiene, swallow precautions, HEP for voice, Person educated: Patient Education method: Explanation, Demonstration, Verbal cues, and Handouts Education comprehension: verbalized understanding, returned demonstration, verbal cues required, and needs further education  HOME EXERCISE PROGRAM: PhoRTE pending laryngology consult.  GOALS: Goals reviewed with patient? Yes  SHORT TERM GOALS: Target date: 07/19/23  Pt will complete HEP for voice with rare min A Baseline: Goal status: INITIAL  2.  Pt will report 25% reduction in requests for repetition subjectively over 1 week Baseline:  Goal status: INITIAL  3.  Pt will follow swallow precautions/diet recommendations following MBSS with rare min A Baseline:  Goal status: INITIAL  4.  Pt will demonstrate clear phonation 18/20 sentences Baseline:  Goal status: INITIAL  5.  Pt will demonstrate no vocal/neck strain when projecting volume in noisy environment or at a distance with occasional min A Baseline:  Goal status: INITIAL  6.  Pt will swallow frequently to eliminate throat clears and reduce drool by 50% with occasional min A Baseline:  Goal status: INITIAL  LONG TERM GOALS: Target date: 08/16/23  Pt will complete HEP for voice with mod I Baseline:  Goal status: INITIAL  2.  Pt will report 50% reduction in need to repeat himself over 1 week Baseline:  Goal status: INITIAL  3.  Pt will maintain clear phonation over 15 minute conversation with mod I Baseline:  Goal status:  INITIAL  4.  Pt will be intelligible in noisy environment/while walking over 15 minute conversation with rare min A Baseline:  Goal status: INITIAL  5.  Pt will improve score  on VRQOL by 4 points  Baseline:  Goal status: INITIAL  ASSESSMENT:  CLINICAL IMPRESSION: Patient is a 77 y.o. male who was seen today for dysphonia, R vocal fold paralysis., s/p augmentation in 2008. He reports inconsistent voice quality resulting in frustration and avoiding social situations due to his voice. He reports drool and coughing on "nasal drip" and saliva due to his perceived pooling of secretions in his pharynx. Today, volume WNL, mild hoarseness, 100% intelligible. He does report occasional dysphagia with food "getting stuck" on the right side of this throat and he coughs to bring it back up. Denies difficulty swallowing pills. According to Chris Welch, his last laryngoscopy was 2008 with vocal fold augmentation. I recommend he be referred to local laryngologist, Chris Welch, for videostroboscopy evaluation of vocal folds to assess function and prior vocal fold augmentation. Chris Welch is in agreement. I recommend skilled ST to maximize intelligibility, voice quality and safety of swallow for independence and QOL. MBSS recommended as well due to reports of dysphagia  OBJECTIVE IMPAIRMENTS: include voice disorder and dysphagia. These impairments are limiting patient from effectively communicating at home and in community and safety when swallowing. Factors affecting potential to achieve goals and functional outcome are previous level of function.. Patient will benefit from skilled SLP services to address above impairments and improve overall function.  REHAB POTENTIAL: Good  PLAN:  SLP FREQUENCY: 1-2x/week  SLP DURATION: 8 weeks  PLANNED INTERVENTIONS: Aspiration precaution training, Pharyngeal strengthening exercises, Diet toleration management , Environmental controls, Trials of upgraded texture/liquids, Cueing  hierachy, Internal/external aids, Multimodal communication approach, SLP instruction and feedback, Compensatory strategies, and Patient/family education, MBSS vs FEES    Chris Welch, Radene Journey, CCC-SLP 06/21/2023, 4:51 PM

## 2023-06-22 ENCOUNTER — Other Ambulatory Visit (HOSPITAL_COMMUNITY): Payer: Self-pay | Admitting: *Deleted

## 2023-06-22 DIAGNOSIS — R1310 Dysphagia, unspecified: Secondary | ICD-10-CM

## 2023-06-22 DIAGNOSIS — R059 Cough, unspecified: Secondary | ICD-10-CM

## 2023-07-05 ENCOUNTER — Ambulatory Visit: Payer: No Typology Code available for payment source | Admitting: Speech Pathology

## 2023-07-05 ENCOUNTER — Encounter: Payer: Self-pay | Admitting: Speech Pathology

## 2023-07-05 DIAGNOSIS — R498 Other voice and resonance disorders: Secondary | ICD-10-CM | POA: Diagnosis not present

## 2023-07-05 DIAGNOSIS — R1312 Dysphagia, oropharyngeal phase: Secondary | ICD-10-CM

## 2023-07-05 NOTE — Therapy (Signed)
OUTPATIENT SPEECH LANGUAGE PATHOLOGY VOICE THEARPY   Patient Name: Chris Welch MRN: [id number] Welch, 77 y.o., Chris Welch Today's Date: 07/05/2023  PCP: Pearline Cables, MD REFERRING PROVIDER: Dorna Leitz, MD  END OF SESSION:  End of Session - 07/05/23 0958     Visit Number 2    Number of Visits 17    Date for SLP Re-Evaluation 08/16/23    Activity Tolerance Patient tolerated treatment well             Past Medical History:  Diagnosis Date   Adenomatous colon polyp 2009   Anemia    Anxiety    Aortic atherosclerosis (HCC)    Arthritis    "hips, knees" (01/27/2016)   Complication of anesthesia    bad dreams-OCC PASSES OUT AFTER ANETHESIA   Depression    Diverticulitis    Diverticulosis    Ear infection ear infection lasting past 2 months, still ongoing, pt on antibiotics & drops. 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   Right Cervical Paraganglioma   Vocal cord paralysis    Right   Past Surgical History:  Procedure Laterality Date   APPENDECTOMY     BACK SURGERY     LOWER   CAROTID BODY TUMOR EXCISION     CERVICAL PARAGANGLIOMA EXCISION Right 12/2006   CHOLECYSTECTOMY OPEN     CYST EXCISION Left 12/17/2019   Procedure: CYST EXCISION; EXCISION NAIL HORN REMNANT LEFT RING FINGER;  Surgeon: Cindee Salt, MD;  Location: [facility];  Service: Orthopedics;  Laterality: Left;  IV REGIONAL   CYSTOSCOPY WITH INSERTION OF UROLIFT N/A  10/12/2020   Procedure: CYSTOSCOPY WITH INSERTION OF UROLIFT;  Surgeon: Vanna Scotland, MD;  Location: [facility];  Service: Urology;  Laterality: N/A;   DEEP NECK LYMPH NODE BIOPSY / EXCISION     DIGIT NAIL REMOVAL Left 07/30/2019   Procedure: REMOVAL OF NAIL HORNS LEFT RING FINGER;  Surgeon: Cindee Salt, MD;  Location: [facility];  Service: Orthopedics;  Laterality: Left;  IV REGIONAL FOREARM BLOCK   ESOPHAGOGASTRODUODENOSCOPY (EGD) WITH ESOPHAGEAL DILATION     INGUINAL HERNIA REPAIR Right 1954   INGUINAL HERNIA REPAIR Left 10/15/2021   Procedure: OPEN LEFT INGUINAL HERNIA REPAIR;  Surgeon: Fritzi Mandes, MD;  Location: [facility] OR;  Service: General;  Laterality: Left;   INSERTION OF MESH N/A 10/15/2021   Procedure: INSERTION OF MESH;  Surgeon: Fritzi Mandes, MD;  Location: [facility] OR;  Service: General;  Laterality: N/A;   JOINT REPLACEMENT     KNEE ARTHROSCOPY Right    LUMBAR LAMINECTOMY/DECOMPRESSION MICRODISCECTOMY Left 03/09/2013   Procedure: LUMBAR LAMINECTOMY/DECOMPRESSION MICRODISCECTOMY 1 LEVEL;  Surgeon: Tia Alert, MD;  Location: [facility];  Service: Neurosurgery;  Laterality: Left;  Left lumbar three-four extra-foraminal microdiscectomy   PATELLA ARTHROPLASTY Right    has  had 5 surgeries right knee   TOTAL HIP ARTHROPLASTY Left 01/27/2016   TOTAL HIP ARTHROPLASTY Left 01/27/2016   Procedure: TOTAL HIP ARTHROPLASTY ANTERIOR APPROACH;  Surgeon: Gean Birchwood, MD;  Location: [facility] OR;  Service: Orthopedics;  Laterality: Left;   UPPER GASTROINTESTINAL ENDOSCOPY     VOCAL CORD IMPLANT     Patient Active Problem List   Diagnosis Date Noted   Degenerative joint disease (DJD) of hip 01/05/2016   Atypical chest pain 04/22/2014   Gastric reflux with aspiration 03/04/2014   Carpal tunnel syndrome 11/26/2013   HTN (hypertension) 11/26/2013   AR (allergic rhinitis) 11/26/2013   Low sodium levels 03/13/2013   BPH associated with nocturia 08/21/2012   Hyperlipidemia 08/21/2012    Recurrent aspiration bronchitis/pneumonia 10/05/2011   Diverticulosis 10/05/2011   Hearing loss of both ears 10/05/2011   Depression 10/05/2011   Vocal cord paralysis 01/17/2007    Onset date: 06/08/2023 (referral date)  REFERRING DIAG: J38.00 (ICD-10-CM) - Vocal cord paralysis  THERAPY DIAG:  Other voice and resonance disorders  Dysphagia, oropharyngeal phase  Rationale for Evaluation and Treatment: Rehabilitation  SUBJECTIVE:   SUBJECTIVE STATEMENT: "I cancelled my swallow study - I don't like barium" Pt accompanied by: self  PERTINENT HISTORY: [name]" Chris Welch is referred to ST by Dr. Lucky Cowboy, E[facility] with [facility]. Dr. Gerilyn Pilgrim is following him for hearing loss and chronic otitis externa. Chris Welch had Right carotid tumor rescetion in 2008, resulting in right vocal fold paralysis. At that time, he had vocal fold augmentation by Dr. Delford Field at [facility] as well as swallowing therapy.  He reports no laryngoscopy since 2008. Chris Welch reported that Dr. Delford Field informed him he may need a revision in the future. Chris Welch reports inconsistent voice quality, variable hoarseness and difficulty being heard in noisy situations. He reports he occasionally has food "get stuck" in right side of his pharynx and he coughs and brings it back up. He did have EDG recently with stretching which improved his swallowing. Denies difficulty with pills, recurrent respiratory illness or pna and no weight loss. Chris Welch endorses "getting choked" on "nasal drip" and pooling of saliva in his throat as well as drool. He is numb on right side of his tongue and neck.  PAIN:  Are you having pain? No  FALLS: Has patient fallen in last 6 months? No,  LIVING ENVIRONMENT: Lives with: lives with their spouse Lives in: House/apartment  PLOF:Level of assistance: Independent with ADLs, Independent with IADLs Employment: Retired  PATIENT GOALS: "to be heard"  OBJECTIVE:  Note: Objective measures were completed at Evaluation unless  otherwise noted.   COGNITION: Overall cognitive status: Within functional limits for tasks assessed  SOCIAL HISTORY: Occupation: Retired Counsellor intake: optimal Caffeine/alcohol intake: minimal Daily voice use: minimal  PERCEPTUAL VOICE ASSESSMENT: Voice quality: hoarse, diplophonia, and   Vocal abuse: habitual throat clearing, frequent coughing on saliva Resonance: normal Respiratory function: thoracic breathing  OBJECTIVE VOICE ASSESSMENT: Maximum phonation time for sustained "ah": 14.61 Conversational pitch average: 155.6 Hz Conversational pitch range: 110-220 Hz Conversational loudness average: 74dB Conversational loudness range: 72-78 dB S/z ratio: .94 (Suggestive of dysfunction >1.0)  PATIENT REPORTED OUTCOME MEASURES (PROM): V-RQOL: 23 - he rated a "4" or a lot of problem being depressed because of his voice; he rated a "3" or moderate problem being heard in noisy places, being frustrated because of his voice, trouble using the phone because of his voice, and avoiding going out socially because of his voice. He rated a "2" or small problem having  to repeat himself and being less outgoing because of his voice.   Oral Motor Exam:  Tongue reduced sensation right side, reduced coordination, atrophy Neck reduced sensation right Cough strong  TODAY'S TREATMENT:                                                                                                                                         DATE:   07/05/23: Chris Welch cancelled his MBSS as he does not want to swallow barium. We discussed option for FEES, which Dr. Irene Pap can order is she feels this is warranted. Chris Welch enters room with hoarse voice. He is completing HEP for voice, with success with clear voice some days, then persistent hoarseness other days. Today, he completed PhoRTE with rare min A, achieving clear phonation with focus on volume/intent. In structured speech tasks, he maintained clear phonation 20/20  sentences and during 8 minute conversation with supervision cues he maintained volume of 72-74dB and clear phonation. Chris Welch continues to require verbal and visual cues to ID throat clears. Harsh throat clearing 10+ times this session. Ongoing training in throat clear alternatives and suppression. He was able to avoid throat clearing 3x after review of throat clear alternatives. He endorses clearing his throat to clear his hoarse voice. Extensive education re: cycle of how throat clearing increases phlegm and how throat clearing contributes to vocal fold irritation and his hoarseness. He sees Dr. Irene Pap next Wednesday. Will modify treatment/HEP as needed pending results of this visit. Cough suppression strategies next session.  06/21/23 (eval day): Initiated training to use breath support rather than vocal strain to be heard over noise or across a room. Initiated training in throat clear alternatives and more frequent swallowing to reduce drool and coughing on perceived pooling of secretions in his pharynx. Reviewed basic swallow precautions, which he is following. Educated re: need to see a Scientist, forensic to assess status of vocal folds and prior vocal fold augmentation since he has not had laryngoscopy since 2008. Provided name  & number of Dr. Irene Pap. Will request referral from Dr. Alita Chyle. Initiated training in HEP, PhoRTE, for dysphonia. Will modify as needed pending laryngology consult.   PATIENT EDUCATION: Education details: See Today's Treatment, See Patient Instructions, vocal hygiene, swallow precautions, HEP for voice, Person educated: Patient Education method: Explanation, Demonstration, Verbal cues, and Handouts Education comprehension: verbalized understanding, returned demonstration, verbal cues required, and needs further education  HOME EXERCISE PROGRAM: PhoRTE pending laryngology consult.  GOALS: Goals reviewed with patient? Yes  SHORT TERM GOALS: Target date: 07/19/23  Pt  will complete HEP for voice with rare min A Baseline: Goal status: ONGOING  2.  Pt will report 25% reduction in requests for repetition subjectively over 1 week Baseline:  Goal status:ONGOING  3.  Pt will follow swallow precautions/diet recommendations following MBSS with rare min A Baseline:  Goal status: ONGOING  4.  Pt will demonstrate clear phonation  18/20 sentences Baseline:  Goal status: ONGOING  5.  Pt will demonstrate no vocal/neck strain when projecting volume in noisy environment or at a distance with occasional min A Baseline:  Goal status: ONGOING  6.  Pt will swallow frequently to eliminate throat clears and reduce drool by 50% with occasional min A Baseline:  Goal status: ONGOING  LONG TERM GOALS: Target date: 08/16/23  Pt will complete HEP for voice with mod I Baseline:  Goal status: ONGOING  2.  Pt will report 50% reduction in need to repeat himself over 1 week Baseline:  Goal status: ONGOING  3.  Pt will maintain clear phonation over 15 minute conversation with mod I Baseline:  Goal status: ONGOING  4.  Pt will be intelligible in noisy environment/while walking over 15 minute conversation with rare min A Baseline:  Goal status: ONGOING  5.  Pt will improve score on VRQOL by 4 points  Baseline:  Goal status: ONGOING  ASSESSMENT:  CLINICAL IMPRESSION: Patient is a 77 y.o. Chris Welch who was seen today for dysphonia, R vocal fold paralysis after carotid tumor resection, s/p augmentation in 2008. He reports inconsistent voice quality resulting in frustration and avoiding social situations due to his voice. He reports drool and coughing on "nasal drip" and saliva due to his perceived pooling of secretions in his pharynx. Today, volume WNL, mild hoarseness, 100% intelligible. He does report occasional dysphagia with food "getting stuck" on the right side of this throat and he coughs to bring it back up. Denies difficulty swallowing pills. According to Chris Welch, his  last laryngoscopy was 2008 with vocal fold augmentation. I recommend he be referred to local laryngologist, Dr. Irene Pap, for videostroboscopy evaluation of vocal folds to assess function and prior vocal fold augmentation. Chris Welch is in agreement. I recommend skilled ST to maximize intelligibility, voice quality and safety of swallow for independence and QOL. MBSS recommended as well due to reports of dysphagia  OBJECTIVE IMPAIRMENTS: include voice disorder and dysphagia. These impairments are limiting patient from effectively communicating at home and in community and safety when swallowing. Factors affecting potential to achieve goals and functional outcome are previous level of function.. Patient will benefit from skilled SLP services to address above impairments and improve overall function.  REHAB POTENTIAL: Good  PLAN:  SLP FREQUENCY: 1-2x/week  SLP DURATION: 8 weeks  PLANNED INTERVENTIONS: Aspiration precaution training, Pharyngeal strengthening exercises, Diet toleration management , Environmental controls, Trials of upgraded texture/liquids, Cueing hierachy, Internal/external aids, Multimodal communication approach, SLP instruction and feedback, Compensatory strategies, and Patient/family education, MBSS vs FEES    Raianna Slight, Radene Journey, CCC-SLP 07/05/2023, 9:59 AM

## 2023-07-05 NOTE — Patient Instructions (Signed)
   I cancelled your next ST appointment on Wed 10/23 at 8:45  You see Dr. Feliberto Harts Wed 10/23 at 1:15 (get there at 1:00 since it is your first visit)  Clearing your throat is damaging to your vocal folds and contributing to your hoarseness  Clearing your throat sends phlegm to your vocal folds resulting in more throat clearing which sends more phlegm - you need to break this cycle  Instead of clearing your throat, swallow hard or sip then power your voice and stop the hoarseness  When you power your voice with a little more volume, you voice becomes more clear

## 2023-07-06 ENCOUNTER — Encounter (HOSPITAL_COMMUNITY): Payer: No Typology Code available for payment source

## 2023-07-06 ENCOUNTER — Ambulatory Visit (HOSPITAL_COMMUNITY): Payer: No Typology Code available for payment source

## 2023-07-12 ENCOUNTER — Encounter (INDEPENDENT_AMBULATORY_CARE_PROVIDER_SITE_OTHER): Payer: Self-pay | Admitting: Otolaryngology

## 2023-07-12 ENCOUNTER — Encounter: Payer: Medicare Other | Admitting: Speech Pathology

## 2023-07-12 ENCOUNTER — Ambulatory Visit (INDEPENDENT_AMBULATORY_CARE_PROVIDER_SITE_OTHER): Payer: Medicare Other | Admitting: Otolaryngology

## 2023-07-12 VITALS — BP 159/90 | HR 83 | Ht 75.0 in | Wt 222.0 lb

## 2023-07-12 DIAGNOSIS — R490 Dysphonia: Secondary | ICD-10-CM | POA: Diagnosis not present

## 2023-07-12 DIAGNOSIS — J3089 Other allergic rhinitis: Secondary | ICD-10-CM

## 2023-07-12 DIAGNOSIS — R0981 Nasal congestion: Secondary | ICD-10-CM

## 2023-07-12 DIAGNOSIS — J383 Other diseases of vocal cords: Secondary | ICD-10-CM

## 2023-07-12 DIAGNOSIS — R1310 Dysphagia, unspecified: Secondary | ICD-10-CM

## 2023-07-12 DIAGNOSIS — J3801 Paralysis of vocal cords and larynx, unilateral: Secondary | ICD-10-CM | POA: Diagnosis not present

## 2023-07-12 DIAGNOSIS — R0982 Postnasal drip: Secondary | ICD-10-CM | POA: Diagnosis not present

## 2023-07-12 DIAGNOSIS — K219 Gastro-esophageal reflux disease without esophagitis: Secondary | ICD-10-CM

## 2023-07-12 NOTE — Patient Instructions (Addendum)
-   we will request records from [place] (sleep study) and from [facility] about vocal fold surgery done in 2008 - schedule swallow study - MBS and esophagram - return after testing  - continue to work with Vernona Rieger SLP on voice and swallowing

## 2023-07-12 NOTE — Progress Notes (Signed)
ENT CONSULT:  Reason for Consult: dysphonia and hx of right vocal fold paralysis   HPI: Chris Welch is an 77 y.o. 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 He is also c/o chronic dysphagia sx, with food getting stuck in his throat, particularly right side of his throat. He feels he has to cough and at times coughs up what he swallowed. Recent EGD with dilation done per record review with some improvement in his sx.  Per report paraganglioma surgery affected his tongue and right vocal fold. He feels that the projection declined gradually. He has to cut up meat in small pieces. No dyspnea. No hx of stroke or heart attach. Mild coughing or choking with liquids or food at times. He reports post-nasal drip and pooling of saliva in the back of his throat. In therapy for voice and swallowing. Records review with   Records Reviewed:  SLP note 06/21/23 [name]" Tesch is referred to ST by Dr. Lucky Cowboy, [facility]. Dr. Gerilyn Pilgrim is following him for hearing loss and chronic otitis externa. Chris Welch had Right carotid tumor resection in 2008, resulting in right vocal fold paralysis. At that time, he had vocal fold augmentation by Dr. Delford Field at [facility]. He reports no laryngoscopy since 2008. Chris Welch reported that Dr. Delford Field informed him he may need a revision in the future. Chris Welch reports inconsistent voice quality, variable hoarseness and difficulty being heard in noisy situations. He reports he occasionally has food "get stuck" in right side of his pharynx and he coughs and brings it back  up. He did have EDG recently with stretching which improved his swallowing. Denies difficulty with pills, recurrent respiratory illness or pna and no weight loss. Chris Welch endorses "getting choked" on "nasal drip" and pooling of saliva in his throat as well as drool. He is numb on right side of his tongue and neck.    Past Medical History:  Diagnosis Date   Adenomatous colon polyp 2009   Anemia    Anxiety    Aortic atherosclerosis (HCC)    Arthritis    "hips, knees" (01/27/2016)   Complication of anesthesia    bad dreams-OCC PASSES OUT AFTER ANETHESIA   Depression    Diverticulitis    Diverticulosis    Ear infection ear infection lasting past 2 months, still ongoing, pt on antibiotics & drops. 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   Right Cervical Paraganglioma   Vocal cord paralysis  Right    Past Surgical History:  Procedure Laterality Date   APPENDECTOMY     BACK SURGERY     LOWER   CAROTID BODY TUMOR EXCISION     CERVICAL PARAGANGLIOMA EXCISION Right 12/2006   CHOLECYSTECTOMY OPEN     CYST EXCISION Left 12/17/2019   Procedure: CYST EXCISION; EXCISION NAIL HORN REMNANT LEFT RING FINGER;  Surgeon: Cindee Salt, MD;  Location: [facility];  Service: Orthopedics;  Laterality: Left;  IV REGIONAL   CYSTOSCOPY WITH INSERTION OF UROLIFT N/A 10/12/2020   Procedure: CYSTOSCOPY WITH INSERTION OF UROLIFT;  Surgeon: Vanna Scotland, MD;  Location: [facility] ORS;  Service:  Urology;  Laterality: N/A;   DEEP NECK LYMPH NODE BIOPSY / EXCISION     DIGIT NAIL REMOVAL Left 07/30/2019   Procedure: REMOVAL OF NAIL HORNS LEFT RING FINGER;  Surgeon: Cindee Salt, MD;  Location:  SURGERY CENTER;  Service: Orthopedics;  Laterality: Left;  IV REGIONAL FOREARM BLOCK   ESOPHAGOGASTRODUODENOSCOPY (EGD) WITH ESOPHAGEAL DILATION     INGUINAL HERNIA REPAIR Right 1954   INGUINAL HERNIA REPAIR Left 10/15/2021   Procedure: OPEN LEFT INGUINAL HERNIA REPAIR;  Surgeon: Fritzi Mandes, MD;  Location: [facility] OR;  Service: General;  Laterality: Left;   INSERTION OF MESH N/A 10/15/2021   Procedure: INSERTION OF MESH;  Surgeon: Fritzi Mandes, MD;  Location: [facility] OR;  Service: General;  Laterality: N/A;   JOINT REPLACEMENT     KNEE ARTHROSCOPY Right    LUMBAR LAMINECTOMY/DECOMPRESSION MICRODISCECTOMY Left 03/09/2013   Procedure: LUMBAR LAMINECTOMY/DECOMPRESSION MICRODISCECTOMY 1 LEVEL;  Surgeon: Tia Alert, MD;  Location: [facility];  Service: Neurosurgery;  Laterality: Left;  Left lumbar three-four extra-foraminal microdiscectomy   PATELLA ARTHROPLASTY Right    has had 5 surgeries right knee   TOTAL HIP ARTHROPLASTY Left 01/27/2016   TOTAL HIP ARTHROPLASTY Left 01/27/2016   Procedure: TOTAL HIP ARTHROPLASTY ANTERIOR APPROACH;  Surgeon: Gean Birchwood, MD;  Location: [facility] OR;  Service: Orthopedics;  Laterality: Left;   UPPER GASTROINTESTINAL ENDOSCOPY     VOCAL CORD IMPLANT      Family History  Problem Relation Age of Onset   Arthritis Brother        back   Emphysema Brother    Cancer Brother        rare cancer-unknown type   Heart disease Mother    Emphysema Father        smoker   Colon cancer Neg Hx    Esophageal cancer Neg Hx    Rectal cancer Neg Hx    Stomach cancer Neg Hx     Social History:  reports that he has never smoked. He has never used smokeless tobacco. He reports current alcohol use of about 3.0 standard drinks of alcohol per week. He reports that he does not  use drugs.  Allergies:  Allergies  Allergen Reactions   Hydrocodone Nausea And Vomiting    "can not take on an empty stomach"   Penicillins Other (See Comments) and Rash    Ulcers on eyes    Medications: I have reviewed the patient's current medications.  The PMH, PSH, Medications, Allergies, and SH were reviewed and updated.  ROS: Constitutional: Negative for fever, weight loss and weight gain. Cardiovascular: Negative for chest pain and dyspnea on exertion. Respiratory: Is not experiencing shortness of breath at rest. Gastrointestinal: Negative for nausea and vomiting. Neurological: Negative for headaches. Psychiatric: The patient is not nervous/anxious  Blood pressure (!) 159/90, pulse 83, height  6\' 3"  (1.905 m), weight 222 lb (100.7 kg), SpO2 97%.  PHYSICAL EXAM:  Exam: General: Well-developed, well-nourished Communication and Voice: Clear pitch and clarity Respiratory Respiratory effort: Equal inspiration and expiration without stridor Cardiovascular Peripheral Vascular: Warm extremities with equal color/perfusion Eyes: No nystagmus with equal extraocular motion bilaterally Neuro/Psych/Balance: Patient oriented to person, place, and time; Appropriate mood and affect; Gait is intact with no imbalance; Cranial nerves I-XII are intact, but right lateral tongue with mild atrophyfasciculations and weakness Head and Face Inspection: Normocephalic and atraumatic without mass or lesion Palpation: Facial skeleton intact without bony stepoffs Salivary Glands: No mass or tenderness Facial Strength: Facial motility symmetric and full bilaterally ENT Pinna: External ear intact and fully developed External canal: Canal is patent with intact skin Tympanic Membrane: Clear and mobile External Nose: No scar or anatomic deformity Internal Nose: Septum is deviated to the left. No polyp, or purulence. Mucosal edema and erythema present.  Bilateral inferior turbinate hypertrophy.   Lips, Teeth, and gums: Mucosa and teeth intact and viable TMJ: No pain to palpation with full mobility Oral cavity/oropharynx: No erythema or exudate, no lesions present Nasopharynx: No mass or lesion with intact mucosa Hypopharynx: Intact mucosa without pooling of secretions Larynx Glottic: Full true vocal cord mobility without lesion or mass Supraglottic: Normal appearing epiglottis and AE folds Interarytenoid Space: No or minimal pachydermia or edema Subglottic Space: Patent without lesion or edema Neck Neck and Trachea: Midline trachea without mass or lesion Thyroid: No mass or nodularity Lymphatics: No lymphadenopathy  Procedure: Summary of Video-Laryngeal-Stroboscopy: The true vocal cords with evidence of R VF paralysis. The medial edges were relatively straight, particularly on the right side.Closure was incomplete with small spindle-shaped glottic gap. Periodicity present. The mucosal wave and amplitude were intact and symmetric. There is moderate interarytenoid pachydermia and post cricoid edema. The mucosa appears healthy without lesions.   Preoperative diagnosis: hoarseness + R VF paralysis and hx of surgery for augmentation  Postoperative diagnosis:   same + PND and GERD/LPR  Procedure: Flexible fiberoptic laryngoscopy with stroboscopy (59563)  Surgeon: Ashok Croon, MD  Anesthesia: Topical lidocaine and Afrin  Complications: None  Condition is stable throughout exam  Indications and consent:   The patient presents to the clinic with hoarseness. All the risks, benefits, and potential complications were reviewed with the patient preoperatively and informed verbal consent was obtained.  Procedure: The patient was seated upright in the exam chair.   Topical lidocaine and Afrin were applied to the nasal cavity. After adequate anesthesia had occurred, the flexible telescope was passed into the nasal cavity. The nasopharynx was patent without mass or lesion. The scope was  passed behind the soft palate and directed toward the base of tongue. The base of tongue was visualized and was symmetric with no apparent masses or abnormal appearing tissue. There were no signs of a mass or pooling of secretions in the piriform sinuses. The supraglottic structures were normal.  The true vocal cords with evidence of R VF paralysis. The medial edges were relatively straight, particularly on the right side. Closure was incomplete with small spindle-shaped glottic gap. Periodicity present. The mucosal wave and amplitude were intact and symmetric. There is moderate interarytenoid pachydermia and post cricoid edema. The mucosa appears healthy without lesions.   The laryngoscope was then slowly withdrawn and the patient tolerated the procedure well. There were no complications or blood loss.  Studies Reviewed:CT chest 06/05/20 Lungs/Pleura: Mild centrilobular and ground-glass nodularity within the RIGHT middle lobe; this is similar  to 2011. RIGHT middle lobe atelectasis. RIGHT upper lobe centrilobular ground-glass. No pleural effusion or pneumothorax.   Upper Abdomen: Trace pneumobilia. Hepatic steatosis. No acute abnormality.   Musculoskeletal: No chest wall abnormality. No acute or significant osseous findings.   IMPRESSION: 1. There is a prominent lobulated azygos with a punctate focus of increased density which may reflect contrast versus calcification. This may correlate to outside reported radiograph finding. Outside radiograph is not available for review. 2. Mild centrilobular and ground-glass nodularity within the RIGHT middle lobe and RIGHT upper lobe, similar to 2011. Findings are most suggestive of an infectious or inflammatory process.   Assessment/Plan: Encounter Diagnoses  Name Primary?   Dysphagia, unspecified type    Vocal fold paralysis, right    Age-related vocal fold atrophy    Dysphonia Yes   Glottic insufficiency    Chronic GERD    Post-nasal drip     Chronic nasal congestion    Environmental and seasonal allergies    77 year old male with history of right paraganglioma resection and right vocal fold and tongue weakness postop in 2008, reportedly underwent augmentation procedure for right vocal fold at [facility] back then, unfortunately records are not available for review at this time, also being seen by ENT at the [facility], and reportedly had diagnosis of OSA, but not able to tolerate CPAP currently.  He is here for multiple complaints including intermittent and gradually worsening dysphonia and poor projection as well as swallowing problems.  Had EGD and stretching done recently per record review.  In voice and swallow therapy currently.  He recalls that he was previously told that his vocal fold surgery will most likely require revision down the line to resize the implant.  Not sure what type of implant or surgery he had done at the time.  Feels that his right tongue is numb but overall movement is intact.   Chronic gradually worsening dysphonia in the setting of right vocal fold paralysis and tongue weakness following resection of right cervical paraganglioma in 2008 - we will request records from [place] (sleep study) and from [facility] about vocal fold surgery done in 2008 -Videostrobe today showed right vocal fold paralysis and incomplete closure with glottic insufficiency as well as vocal fold atrophy bilaterally but mucosal wave appeared to be symmetric and intact, there was no pooling of secretions in piriforms -We discussed that vocal fold implant surgery does require revision later on due to progressive vocal fold atrophy and the need for a larger implant over time -Will order CT neck with contrast fine cuts to evaluate for presence of/ position of the right vocal fold implant and the size of the implant he likely had done in 2008 at [facility] -Continue voice and swallow therapy with speech  2.  Dysphagia oropharyngeal versus esophageal unclear at this  time, mostly to solid foods, no weight loss or pneumonia, eating regular diet currently, had recent EGD and dilation.  He previously canceled his swallow assessment because he does not like to swallow barium. - schedule swallow study - MBS and esophagram  3.  Chronic nasal congestion postnasal drainage -I did not see pooling of secretions on scope exam today, but he had evidence of mucosal edema and clear postnasal drainage.  Suspect environmental allergies.  -Trial of Flonase 2 puffs bilateral nares twice daily and Zyrtec 10 mg daily  4. GERD LPR - diet and lifestyle changes to minimize reflux and trial of reflux gourmet  Will request records from the [place] and [place] including operative  reports if available and results of his sleep study if available, reportedly done at the [facility] after testing  Thank you for allowing me to participate in the care of this patient. Please do not hesitate to contact me with any questions or concerns.   Ashok Croon, MD Otolaryngology [facility] Phone: [phone] Fax: [phone]    07/14/2023, 8:34 AM

## 2023-07-14 ENCOUNTER — Other Ambulatory Visit (HOSPITAL_BASED_OUTPATIENT_CLINIC_OR_DEPARTMENT_OTHER): Payer: Self-pay

## 2023-07-14 ENCOUNTER — Other Ambulatory Visit (HOSPITAL_COMMUNITY): Payer: Self-pay | Admitting: *Deleted

## 2023-07-14 DIAGNOSIS — R1310 Dysphagia, unspecified: Secondary | ICD-10-CM

## 2023-07-14 MED ORDER — FLUTICASONE PROPIONATE 50 MCG/ACT NA SUSP
2.0000 | Freq: Two times a day (BID) | NASAL | 6 refills | Status: DC
  Filled 2023-07-14: qty 16, 30d supply, fill #0

## 2023-07-14 MED ORDER — CETIRIZINE HCL 10 MG PO TABS
10.0000 mg | ORAL_TABLET | Freq: Every day | ORAL | 11 refills | Status: AC
  Filled 2023-07-14: qty 30, 30d supply, fill #0

## 2023-07-19 ENCOUNTER — Ambulatory Visit: Payer: No Typology Code available for payment source | Admitting: Speech Pathology

## 2023-07-19 ENCOUNTER — Encounter: Payer: Self-pay | Admitting: Speech Pathology

## 2023-07-19 DIAGNOSIS — R498 Other voice and resonance disorders: Secondary | ICD-10-CM

## 2023-07-19 DIAGNOSIS — R1312 Dysphagia, oropharyngeal phase: Secondary | ICD-10-CM

## 2023-07-19 NOTE — Therapy (Signed)
OUTPATIENT SPEECH LANGUAGE PATHOLOGY VOICE THEARPY   Patient Name: Chris Welch MRN: [id number] DOB:11-Sep-1946, 77 y.o., male Today's Date: 07/19/2023  PCP: Chris Cables, MD REFERRING PROVIDER: Dorna Leitz, MD  END OF SESSION:  End of Session - 07/19/23 0840     Visit Number 3    Number of Visits 17    Date for SLP Re-Evaluation 08/16/23    SLP Start Time 0845    SLP Stop Time  0930    SLP Time Calculation (min) 45 min    Activity Tolerance Patient tolerated treatment well             Past Medical History:  Diagnosis Date   Adenomatous colon polyp 2009   Anemia    Anxiety    Aortic atherosclerosis (HCC)    Arthritis    "hips, knees" (01/27/2016)   Complication of anesthesia    bad dreams-OCC PASSES OUT AFTER ANETHESIA   Depression    Diverticulitis    Diverticulosis    Ear infection ear infection lasting past 2 months, still ongoing, pt on antibiotics & drops. 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   Right Cervical Paraganglioma   Vocal cord paralysis    Right   Past Surgical History:  Procedure Laterality Date   APPENDECTOMY     BACK SURGERY     LOWER   CAROTID BODY TUMOR EXCISION     CERVICAL PARAGANGLIOMA EXCISION Right 12/2006   CHOLECYSTECTOMY OPEN     CYST EXCISION Left 12/17/2019   Procedure: CYST EXCISION; EXCISION NAIL HORN REMNANT LEFT RING FINGER;  Surgeon: Cindee Salt, MD;  Location: [facility];  Service:  Orthopedics;  Laterality: Left;  IV REGIONAL   CYSTOSCOPY WITH INSERTION OF UROLIFT N/A 10/12/2020   Procedure: CYSTOSCOPY WITH INSERTION OF UROLIFT;  Surgeon: Vanna Scotland, MD;  Location: [facility];  Service: Urology;  Laterality: N/A;   DEEP NECK LYMPH NODE BIOPSY / EXCISION     DIGIT NAIL REMOVAL Left 07/30/2019   Procedure: REMOVAL OF NAIL HORNS LEFT RING FINGER;  Surgeon: Cindee Salt, MD;  Location: [facility];  Service: Orthopedics;  Laterality: Left;  IV REGIONAL FOREARM BLOCK   ESOPHAGOGASTRODUODENOSCOPY (EGD) WITH ESOPHAGEAL DILATION     INGUINAL HERNIA REPAIR Right 1954   INGUINAL HERNIA REPAIR Left 10/15/2021   Procedure: OPEN LEFT INGUINAL HERNIA REPAIR;  Surgeon: Fritzi Mandes, MD;  Location: [facility] OR;  Service: General;  Laterality: Left;   INSERTION OF MESH N/A 10/15/2021   Procedure: INSERTION OF MESH;  Surgeon: Fritzi Mandes, MD;  Location: [facility] OR;  Service: General;  Laterality: N/A;   JOINT REPLACEMENT     KNEE ARTHROSCOPY Right    LUMBAR LAMINECTOMY/DECOMPRESSION MICRODISCECTOMY Left 03/09/2013   Procedure: LUMBAR LAMINECTOMY/DECOMPRESSION MICRODISCECTOMY 1 LEVEL;  Surgeon: Tia Alert, MD;  Location:  [facility];  Service: Neurosurgery;  Laterality: Left;  Left lumbar three-four extra-foraminal microdiscectomy   PATELLA ARTHROPLASTY Right    has had 5 surgeries right knee   TOTAL HIP ARTHROPLASTY Left 01/27/2016   TOTAL HIP ARTHROPLASTY Left 01/27/2016   Procedure: TOTAL HIP ARTHROPLASTY ANTERIOR APPROACH;  Surgeon: Gean Birchwood, MD;  Location: [facility] OR;  Service: Orthopedics;  Laterality: Left;   UPPER GASTROINTESTINAL ENDOSCOPY     VOCAL CORD IMPLANT     Patient Active Problem List   Diagnosis Date Noted   Degenerative joint disease (DJD) of hip 01/05/2016   Atypical chest pain 04/22/2014   Gastric reflux with aspiration 03/04/2014   Carpal tunnel syndrome 11/26/2013   HTN (hypertension) 11/26/2013   AR (allergic rhinitis) 11/26/2013   Low sodium  levels 03/13/2013   BPH associated with nocturia 08/21/2012   Hyperlipidemia 08/21/2012   Recurrent aspiration bronchitis/pneumonia 10/05/2011   Diverticulosis 10/05/2011   Hearing loss of both ears 10/05/2011   Depression 10/05/2011   Vocal cord paralysis 01/17/2007    Onset date: 06/08/2023 (referral date)  REFERRING DIAG: J38.00 (ICD-10-CM) - Vocal cord paralysis  THERAPY DIAG:  Other voice and resonance disorders  Dysphagia, oropharyngeal phase  Rationale for Evaluation and Treatment: Rehabilitation  SUBJECTIVE:   SUBJECTIVE STATEMENT: "I cancelled my swallow study - I don't like barium" Pt accompanied by: self  PERTINENT HISTORY: Chris Welch" Chris Welch is referred to ST by Dr. [name], E[facility] with [facility]. Dr. Gerilyn Welch is following him for hearing loss and chronic otitis externa. Chris Welch had Right carotid tumor rescetion in 2008, resulting in right vocal fold paralysis. At that time, he had vocal fold augmentation by Chris [facility] at [facility] as well as swallowing therapy.  He reports no laryngoscopy since 2008. Chris Welch reported that Chris Welch informed him he may need a revision in the future. Chris Welch reports inconsistent voice quality, variable hoarseness and difficulty being heard in noisy situations. He reports he occasionally has food "get stuck" in right side of his pharynx and he coughs and brings it back up. He did have EDG recently with stretching which improved his swallowing. Denies difficulty with pills, recurrent respiratory illness or pna and no weight loss. Chris Welch endorses "getting choked" on "nasal drip" and pooling of saliva in his throat as well as drool. He is numb on right side of his tongue and neck.  PAIN:  Are you having pain? No  FALLS: Has patient fallen in last 6 months? No,  LIVING ENVIRONMENT: Lives with: lives with their spouse Lives in: House/apartment  PLOF:Level of assistance: Independent with ADLs, Independent with IADLs Employment: Retired  PATIENT GOALS:  "to be heard"  OBJECTIVE:  Note: Objective measures were completed at Evaluation unless otherwise noted.   COGNITION: Overall cognitive status: Within functional limits for tasks assessed   TODAY'S TREATMENT:  DATE:   07/19/23: Chris Welch has seen Dr. Feliberto Harts - he is aware that he has MBSS on Monday. He is completing HEP for voice with success. Initiated training in basic swallow exercises - will modify after MBSS. He completed effortful swallow masako and Mendelssohn with occasional min verbal cues and modeling. He is questioning ENT's dx of LPR - reviewed LPR education, provided handout. Reflux precaution verbalized with rare min A.   10/16/24[name] cancelled his MBSS as he does not want to swallow barium. He doesn't feel like he has difficulty swallowing. He sees ENT next week, defer swallow study at this time. Chris Welch enters room with hoarse voice. He is completing HEP for voice, with success with clear voice some days, then persistent hoarseness other days. Today, he completed PhoRTE with rare min A, achieving clear phonation with focus on volume/intent. In structured speech tasks, he maintained clear phonation 20/20 sentences and during 8 minute conversation with supervision cues he maintained volume of 72-74dB and clear phonation. Chris Welch continues to require verbal and visual cues to ID throat clears. Harsh throat clearing 10+ times this session. Ongoing training in throat clear alternatives and suppression. He was able to avoid throat clearing 3x after review of throat clear alternatives. He endorses clearing his throat to clear his hoarse voice. Extensive education re: cycle of how throat clearing increases phlegm and how throat clearing contributes to vocal fold irritation and his hoarseness. He sees Dr. Irene Pap next Wednesday. Will modify treatment/HEP as  needed pending results of this visit. Cough suppression strategies next session.  06/21/23 (eval day): Initiated training to use breath support rather than vocal strain to be heard over noise or across a room. Initiated training in throat clear alternatives and more frequent swallowing to reduce drool and coughing on perceived pooling of secretions in his pharynx. Reviewed basic swallow precautions, which he is following. Educated re: need to see a Scientist, forensic to assess status of vocal folds and prior vocal fold augmentation since he has not had laryngoscopy since 2008. Provided name  & number of Dr. Irene Pap. Will request referral from Dr. Alita Chyle. Initiated training in HEP, PhoRTE, for dysphonia. Will modify as needed pending laryngology consult.   PATIENT EDUCATION: Education details: See Today's Treatment, See Patient Instructions, vocal hygiene, swallow precautions, HEP for voice, Person educated: Patient Education method: Explanation, Demonstration, Verbal cues, and Handouts Education comprehension: verbalized understanding, returned demonstration, verbal cues required, and needs further education  HOME EXERCISE PROGRAM: PhoRTE pending laryngology consult.  GOALS: Goals reviewed with patient? Yes  SHORT TERM GOALS: Target date: 07/19/23  Pt will complete HEP for voice with rare min A Baseline: Goal status: ONGOING  2.  Pt will report 25% reduction in requests for repetition subjectively over 1 week Baseline:  Goal status:ONGOING  3.  Pt will follow swallow precautions/diet recommendations following MBSS with rare min A Baseline:  Goal status: ONGOING  4.  Pt will demonstrate clear phonation 18/20 sentences Baseline:  Goal status: ONGOING  5.  Pt will demonstrate no vocal/neck strain when projecting volume in noisy environment or at a distance with occasional min A Baseline:  Goal status: ONGOING  6.  Pt will swallow frequently to eliminate throat clears and reduce  drool by 50% with occasional min A Baseline:  Goal status: ONGOING  LONG TERM GOALS: Target date: 08/16/23  Pt will complete HEP for voice with mod I Baseline:  Goal status: ONGOING  2.  Pt will report 50% reduction in need to repeat himself over  1 week Baseline:  Goal status: ONGOING  3.  Pt will maintain clear phonation over 15 minute conversation with mod I Baseline:  Goal status: ONGOING  4.  Pt will be intelligible in noisy environment/while walking over 15 minute conversation with rare min A Baseline:  Goal status: ONGOING  5.  Pt will improve score on VRQOL by 4 points  Baseline:  Goal status: ONGOING  ASSESSMENT:  CLINICAL IMPRESSION: Patient is a 77 y.o. male who was seen today for dysphonia, R vocal fold paralysis after carotid tumor resection, s/p augmentation in 2008. He reports inconsistent voice quality resulting in frustration and avoiding social situations due to his voice. He reports drool and coughing on "nasal drip" and saliva due to his perceived pooling of secretions in his pharynx. Today, volume WNL, mild hoarseness, 100% intelligible. He does report occasional dysphagia with food "getting stuck" on the right side of this throat and he coughs to bring it back up. Denies difficulty swallowing pills. According to Chris Welch, his last laryngoscopy was 2008 with vocal fold augmentation. I recommend he be referred to local laryngologist, Dr. Irene Pap, for videostroboscopy evaluation of vocal folds to assess function and prior vocal fold augmentation. Viviann Spare is in agreement. I recommend skilled ST to maximize intelligibility, voice quality and safety of swallow for independence and QOL. MBSS recommended as well due to reports of dysphagia  OBJECTIVE IMPAIRMENTS: include voice disorder and dysphagia. These impairments are limiting patient from effectively communicating at home and in community and safety when swallowing. Factors affecting potential to achieve goals and  functional outcome are previous level of function.. Patient will benefit from skilled SLP services to address above impairments and improve overall function.  REHAB POTENTIAL: Good  PLAN:  SLP FREQUENCY: 1-2x/week  SLP DURATION: 8 weeks  PLANNED INTERVENTIONS: Aspiration precaution training, Pharyngeal strengthening exercises, Diet toleration management , Environmental controls, Trials of upgraded texture/liquids, Cueing hierachy, Internal/external aids, Multimodal communication approach, SLP instruction and feedback, Compensatory strategies, and Patient/family education, MBSS vs FEES    Tiandra Swoveland, Radene Journey, CCC-SLP 07/19/2023, 12:04 PM

## 2023-07-19 NOTE — Patient Instructions (Addendum)
   The LPR can increase your hoarseness - the medicine you take for reflux gets rid of the acid in your stomach, however it does not stop the reflux from coming up. The reflux you have just has no acid.  Throat clearing can also increase your hoarseness - try to do sips or hard swallows instead when you can  Because your vocal folds do not come together, you are more at risk for aspiration - you are doing fine just be aware of this  Keep swallowing   The appointment with Dr. Irene Pap 08/30/23 1:45  SWALLOWING EXERCISES Effortful Swallows - Squeeze hard with the muscles in your neck while you swallow your  saliva or a sip of water - Repeat 20 times, 2-3 times a day, and use whenever you eat or drink  Masako Swallow - swallow with your tongue sticking out - Stick tongue out and gently bite tongue with your teeth - Swallow, while holding your tongue with your teeth - Repeat 20 times, 2-3 times a day   Shaker Exercise - head lift - Lie flat on your back in your bed or on a couch without pillows - Raise your head and look at your feet  - KEEP YOUR SHOULDERS DOWN - HOLD FOR 45 to 60 SECONDS, then lower your head back down - Repeat 3 times, 2-3 times a day   [employer] -  swallow as tight as you  for 5 seconds - Start to swallow, and keep your Adam's apple up by squeezing tight with the muscles of the throat - Hold the squeeze for 5-7 seconds and then relax - Repeat 20 times, 2-3 times a day   Tongue Press - Press your entire tongue as hard as you can against the roof of your mouth for 3-5 seconds - Repeat 20 times, 2-3 times a day        6. CTAR - Chin Tuck Against Resistance              - Place towel, ball or pool noodle under your chin             - Hold for 60 seconds 2-3x  a day             - Pulse up and down 20x 2-3x a day

## 2023-07-24 ENCOUNTER — Ambulatory Visit (HOSPITAL_COMMUNITY)
Admission: RE | Admit: 2023-07-24 | Discharge: 2023-07-24 | Disposition: A | Discharge status: Home / Self Care | Payer: Medicare Other | Source: Ambulatory Visit | Attending: Otolaryngology | Admitting: Otolaryngology

## 2023-07-24 ENCOUNTER — Ambulatory Visit (HOSPITAL_COMMUNITY)
Admission: RE | Admit: 2023-07-24 | Discharge: 2023-07-24 | Disposition: A | Discharge status: Home / Self Care | Payer: Medicare Other | Source: Ambulatory Visit | Attending: Family Medicine | Admitting: Family Medicine

## 2023-07-24 DIAGNOSIS — J383 Other diseases of vocal cords: Secondary | ICD-10-CM | POA: Diagnosis not present

## 2023-07-24 DIAGNOSIS — K224 Dyskinesia of esophagus: Secondary | ICD-10-CM | POA: Insufficient documentation

## 2023-07-24 DIAGNOSIS — Z8701 Personal history of pneumonia (recurrent): Secondary | ICD-10-CM | POA: Diagnosis not present

## 2023-07-24 DIAGNOSIS — K219 Gastro-esophageal reflux disease without esophagitis: Secondary | ICD-10-CM | POA: Diagnosis not present

## 2023-07-24 DIAGNOSIS — R490 Dysphonia: Secondary | ICD-10-CM

## 2023-07-24 DIAGNOSIS — R1310 Dysphagia, unspecified: Secondary | ICD-10-CM

## 2023-07-24 DIAGNOSIS — R059 Cough, unspecified: Secondary | ICD-10-CM | POA: Diagnosis not present

## 2023-07-24 DIAGNOSIS — J3801 Paralysis of vocal cords and larynx, unilateral: Secondary | ICD-10-CM | POA: Insufficient documentation

## 2023-07-24 DIAGNOSIS — R1313 Dysphagia, pharyngeal phase: Secondary | ICD-10-CM | POA: Insufficient documentation

## 2023-07-24 DIAGNOSIS — Z9889 Other specified postprocedural states: Secondary | ICD-10-CM | POA: Diagnosis not present

## 2023-07-24 MED ORDER — IOHEXOL 350 MG/ML SOLN
75.0000 mL | Freq: Once | INTRAVENOUS | Status: AC | PRN
  Administered 2023-07-24: 75 mL via INTRAVENOUS

## 2023-07-24 NOTE — Evaluation (Signed)
Modified Barium Swallow Study  Patient Details  Name: Chris Welch MRN: [id number] Date of Birth: 1946/05/20  Today's Date: 07/24/2023  Modified Barium Swallow completed.  Full report located under Chart Review in the Imaging Section.  History of Present Illness Pt is a 76 yo referred from [facility] by ENT. Pt reports a h/o chronic dysphagia with symptoms that include feeling choked, pooling of saliva, and drooling, but denies any acute concerns. Pt currently seeing OP for dysphonia.     PMH includes: R cervical paraganglioma/carotid tumor resection in 2008 with resultant R vocal fold paralysis s/p vocal fold augmentation, recurrent aspiration bronchitis/PNA (per pt, in 2008), gastric reflux with aspiration, esophageal structure s/p dilation   Clinical Impression Pt presents with a pharyngeal more than oral dysphagia that he reports to be chronic in nature. He has R buccal pocketing of barium and saliva but lingual transport is relatively swift. He has minimal pharyngeal squeeze and almost no epiglottic inversion. A prominent CP impacts bolus clearance into the UES as well, and he has moderate amounts of residue. This fills his pyriform sinuses but is also noted along his R lateral channel. He has fairly consistent, trace penetration with thin liquids that reaches his vocal folds. It was never observed to fall below them, but he almost never cleared them spontaneously (PAS 5). A cued throat clear was consistently effective at clearing his airway, but he said that he's been trying not to clear his throat from a vocal hygiene standpoint. A head turn to the R and a breath hold were both ineffective at increasing airway protection, but a L head tilt resulted in improved pharyngeal clearance and more shallow penetration with thin liquids. He used liquid washes to reduce pharyngeal residue from solids. Given that pt denies any acute changes and has no recent h/o PNA, he is not interested in any diet  modifications at this time. Education was provided about continuing to stay active and keeping up with oral hygiene. We did discuss trying alternative strategies, such as a L head tilt, instead of throat clearing, but overall he is hesitant to make any changes because he feels like what he has been doing has been effective and it is easy for him to remember.  Factors that may increase risk of adverse event in presence of aspiration Rubye Oaks & Clearance Coots 2021): Respiratory or GI disease  Swallow Evaluation Recommendations Recommendations: PO diet PO Diet Recommendation: Regular;Thin liquids (Level 0) (modifying solids per pt prefence including chopping solids into fine pieces) Liquid Administration via: Cup Medication Administration: Crushed with puree (could not swallow pill on esophagram) Supervision: Patient able to self-feed Swallowing strategies  : Slow rate;Small bites/sips;Head tilt left during swallowing;Clear throat intermittently;Follow solids with liquids Postural changes: Position pt fully upright for meals;Stay upright 30-60 min after meals Oral care recommendations: Oral care BID (2x/day)      Mahala Menghini., M.A. CCC-SLP Acute Rehabilitation Services Office [phone]  Secure chat preferred  07/24/2023,1:32 PM

## 2023-07-25 ENCOUNTER — Encounter: Payer: Self-pay | Admitting: Gastroenterology

## 2023-07-26 ENCOUNTER — Other Ambulatory Visit (HOSPITAL_COMMUNITY): Payer: No Typology Code available for payment source

## 2023-07-26 ENCOUNTER — Ambulatory Visit (HOSPITAL_COMMUNITY): Payer: Medicare Other

## 2023-07-26 ENCOUNTER — Encounter: Payer: Medicare Other | Admitting: Speech Pathology

## 2023-07-31 ENCOUNTER — Ambulatory Visit: Payer: No Typology Code available for payment source | Attending: Family Medicine

## 2023-07-31 DIAGNOSIS — R1312 Dysphagia, oropharyngeal phase: Secondary | ICD-10-CM | POA: Diagnosis present

## 2023-07-31 DIAGNOSIS — R498 Other voice and resonance disorders: Secondary | ICD-10-CM | POA: Diagnosis present

## 2023-07-31 NOTE — Therapy (Signed)
OUTPATIENT SPEECH LANGUAGE PATHOLOGY VOICE THEARPY   Patient Name: Chris Welch MRN: [id number] DOB:1945-10-21, 77 y.o., male Today's Date: 07/31/2023  PCP: Chris Cables, MD REFERRING PROVIDER: Dorna Leitz, MD  END OF SESSION:  End of Session - 07/31/23 0756     Visit Number 4    Number of Visits 17    Date for SLP Re-Evaluation 08/16/23    SLP Start Time 0800    SLP Stop Time  0845    SLP Time Calculation (min) 45 min    Activity Tolerance Patient tolerated treatment well              Past Medical History:  Diagnosis Date   Adenomatous colon polyp 2009   Anemia    Anxiety    Aortic atherosclerosis (HCC)    Arthritis    "hips, knees" (01/27/2016)   Complication of anesthesia    bad dreams-OCC PASSES OUT AFTER ANETHESIA   Depression    Diverticulitis    Diverticulosis    Ear infection ear infection lasting past 2 months, still ongoing, pt on antibiotics & drops. 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   Right Cervical Paraganglioma   Vocal cord paralysis    Right   Past Surgical History:  Procedure Laterality Date   APPENDECTOMY     BACK SURGERY     LOWER   CAROTID BODY TUMOR EXCISION     CERVICAL PARAGANGLIOMA EXCISION Right 12/2006   CHOLECYSTECTOMY OPEN     CYST EXCISION Left 12/17/2019   Procedure: CYST EXCISION; EXCISION NAIL HORN REMNANT LEFT RING FINGER;  Surgeon: Chris Salt, MD;  Location: [facility];  Service:  Orthopedics;  Laterality: Left;  IV REGIONAL   CYSTOSCOPY WITH INSERTION OF UROLIFT N/A 10/12/2020   Procedure: CYSTOSCOPY WITH INSERTION OF UROLIFT;  Surgeon: Chris Scotland, MD;  Location: [facility];  Service: Urology;  Laterality: N/A;   DEEP NECK LYMPH NODE BIOPSY / EXCISION     DIGIT NAIL REMOVAL Left 07/30/2019   Procedure: REMOVAL OF NAIL HORNS LEFT RING FINGER;  Surgeon: Chris Salt, MD;  Location: [facility];  Service: Orthopedics;  Laterality: Left;  IV REGIONAL FOREARM BLOCK   ESOPHAGOGASTRODUODENOSCOPY (EGD) WITH ESOPHAGEAL DILATION     INGUINAL HERNIA REPAIR Right 1954   INGUINAL HERNIA REPAIR Left 10/15/2021   Procedure: OPEN LEFT INGUINAL HERNIA REPAIR;  Surgeon: Chris Mandes, MD;  Location: [facility];  Service: General;  Laterality: Left;   INSERTION OF MESH N/A 10/15/2021   Procedure: INSERTION OF MESH;  Surgeon: Chris Mandes, MD;  Location: [facility] OR;  Service: General;  Laterality: N/A;   JOINT REPLACEMENT     KNEE ARTHROSCOPY Right    LUMBAR LAMINECTOMY/DECOMPRESSION MICRODISCECTOMY Left 03/09/2013   Procedure: LUMBAR LAMINECTOMY/DECOMPRESSION MICRODISCECTOMY 1 LEVEL;  Surgeon: Chris Alert, MD;  Location: [facility];  Service: Neurosurgery;  Laterality: Left;  Left lumbar three-four extra-foraminal microdiscectomy   PATELLA ARTHROPLASTY Right    has had 5 surgeries right knee   TOTAL HIP ARTHROPLASTY Left 01/27/2016   TOTAL HIP ARTHROPLASTY Left 01/27/2016   Procedure: TOTAL HIP ARTHROPLASTY ANTERIOR APPROACH;  Surgeon: Chris Birchwood, MD;  Location: [facility] OR;  Service: Orthopedics;  Laterality: Left;   UPPER GASTROINTESTINAL ENDOSCOPY     VOCAL CORD IMPLANT     Patient Active Problem List   Diagnosis Date Noted   Degenerative joint disease (DJD) of hip 01/05/2016   Atypical chest pain 04/22/2014   Gastric reflux with aspiration 03/04/2014   Carpal tunnel syndrome 11/26/2013   HTN (hypertension) 11/26/2013   AR (allergic rhinitis) 11/26/2013   Low sodium  levels 03/13/2013   BPH associated with nocturia 08/21/2012   Hyperlipidemia 08/21/2012   Recurrent aspiration bronchitis/pneumonia 10/05/2011   Diverticulosis 10/05/2011   Hearing loss of both ears 10/05/2011   Depression 10/05/2011   Vocal cord paralysis 01/17/2007    Onset date: 06/08/2023 (referral date)  REFERRING DIAG: J38.00 (ICD-10-CM) - Vocal cord paralysis  THERAPY DIAG:  Other voice and resonance disorders  Dysphagia, oropharyngeal phase  Rationale for Evaluation and Treatment: Rehabilitation  SUBJECTIVE:   SUBJECTIVE STATEMENT: "It goes up and down (re: voice)" Pt accompanied by: self  PERTINENT HISTORY: Chris Welch" Chris Welch is referred to ST by Chris Welch, [facility]. Chris Welch is following him for hearing loss and chronic otitis externa. Chris Welch had Right carotid tumor rescetion in 2008, resulting in right vocal fold paralysis. At that time, he had vocal fold augmentation by Chris Welch at [facility] as well as swallowing therapy.  He reports no laryngoscopy since 2008. Chris Welch reported that Chris Welch informed him he may need a revision in the future. Chris Welch reports inconsistent voice quality, variable hoarseness and difficulty being heard in noisy situations. He reports he occasionally has food "get stuck" in right side of his pharynx and he coughs and brings it back up. He did have EDG recently with stretching which improved his swallowing. Denies difficulty with pills, recurrent respiratory illness or pna and no weight loss. Chris Welch endorses "getting choked" on "nasal drip" and pooling of saliva in his throat as well as drool. He is numb on right side of his tongue and neck.  PAIN:  Are you having pain? No  FALLS: Has patient fallen in last 6 months? No,  LIVING ENVIRONMENT: Lives with: lives with their spouse Lives in: House/apartment  PLOF:Level of assistance: Independent with ADLs, Independent with IADLs Employment: Retired  PATIENT GOALS: "to be  heard"  OBJECTIVE:  Note: Objective measures were completed at Evaluation unless otherwise noted.  DIAGNOSTIC: MBSS (07/24/23) Clinical Impression: Pt presents with a pharyngeal more than oral dysphagia that he reports to be chronic in nature. He has R buccal pocketing of barium and saliva but lingual transport is relatively swift. He has minimal pharyngeal squeeze and almost no epiglottic inversion. A prominent CP impacts bolus clearance into the UES as well, and he has moderate amounts of residue. This fills his pyriform sinuses but is also noted along his R lateral channel. He has fairly consistent, trace penetration with thin liquids that reaches his vocal folds. It was never observed to fall below them, but he almost never cleared them spontaneously (PAS 5). A cued throat clear was consistently effective at clearing his airway, but he said that he's been trying not to clear his throat from  a vocal hygiene standpoint. A head turn to the R and a breath hold were both ineffective at increasing airway protection, but a L head tilt resulted in improved pharyngeal clearance and more shallow penetration with thin liquids. He used liquid washes to reduce pharyngeal residue from solids. Given that pt denies any acute changes and has no recent h/o PNA, he is not interested in any diet modifications at this time. Education was provided about continuing to stay active and keeping up with oral hygiene. We did discuss trying alternative strategies, such as a L head tilt, instead of throat clearing, but overall he is hesitant to make any changes because he feels like what he has been doing has been effective and it is easy for him to remember.   TODAY'S TREATMENT:                                                                                                                                         07/31/23: Educated pt on MBSS results and recommendations (see above), including intermittent throat clearing for air  protection (only during PO intake) and multiple swallows & liquid washes for residue clearance. Pt feels he is already independently compensating and modifying diet appropriately. Recommended pt continue trained swallow exercises from last session to maximize swallow function. Educated pt on 3 pillars of PNA, with endorsement of adequate oral care and good health status despite presence of oropharyngeal dysphagia. Answered all questions to pt satisfaction re: MBSS and recs. Overall pt presented with clear vocal quality ~80-85% of session with some intermittent hoarseness. Reported significant hearing impairment impacts auditory feedback and analysis of his vocal quality. Reviewed PhoRTE exercises, with usual min cues required to optimize vocal intensity for optimal quality. Throat clears x2 exhibited this session, with no drooling evidenced.   07/19/23: Chris Welch has seen Dr. Feliberto Harts - he is aware that he has MBSS on Monday. He is completing HEP for voice with success. Initiated training in basic swallow exercises - will modify after MBSS. He completed effortful swallow masako and Mendelssohn with occasional min verbal cues and modeling. He is questioning ENT's dx of LPR - reviewed LPR education, provided handout. Reflux precaution verbalized with rare min A.   10/16/24[name] cancelled his MBSS as he does not want to swallow barium. He doesn't feel like he has difficulty swallowing. He sees ENT next week, defer swallow study at this time. Chris Welch enters room with hoarse voice. He is completing HEP for voice, with success with clear voice some days, then persistent hoarseness other days. Today, he completed PhoRTE with rare min A, achieving clear phonation with focus on volume/intent. In structured speech tasks, he maintained clear phonation 20/20 sentences and during 8 minute conversation with supervision cues he maintained volume of 72-74dB and clear phonation. Chris Welch continues to require verbal and visual cues to  ID throat clears. Harsh throat clearing 10+ times this session.  Ongoing training in throat clear alternatives and suppression. He was able to avoid throat clearing 3x after review of throat clear alternatives. He endorses clearing his throat to clear his hoarse voice. Extensive education re: cycle of how throat clearing increases phlegm and how throat clearing contributes to vocal fold irritation and his hoarseness. He sees Dr. Irene Pap next Wednesday. Will modify treatment/HEP as needed pending results of this visit. Cough suppression strategies next session.  06/21/23 (eval day): Initiated training to use breath support rather than vocal strain to be heard over noise or across a room. Initiated training in throat clear alternatives and more frequent swallowing to reduce drool and coughing on perceived pooling of secretions in his pharynx. Reviewed basic swallow precautions, which he is following. Educated re: need to see a Scientist, forensic to assess status of vocal folds and prior vocal fold augmentation since he has not had laryngoscopy since 2008. Provided name  & number of Dr. Irene Pap. Will request referral from Dr. Alita Chyle. Initiated training in HEP, PhoRTE, for dysphonia. Will modify as needed pending laryngology consult.   PATIENT EDUCATION: Education details: See Today's Treatment, See Patient Instructions, vocal hygiene, swallow precautions, HEP for voice, Person educated: Patient Education method: Explanation, Demonstration, Verbal cues, and Handouts Education comprehension: verbalized understanding, returned demonstration, verbal cues required, and needs further education  HOME EXERCISE PROGRAM: PhoRTE pending laryngology consult.  GOALS: Goals reviewed with patient? Yes  SHORT TERM GOALS: Target date: 07/19/23  Pt will complete HEP for voice with rare min A Baseline: Goal status: PARTIALLY MET  2.  Pt will report 25% reduction in requests for repetition subjectively over 1  week Baseline:  Goal status: MET  3.  Pt will follow swallow precautions/diet recommendations following MBSS with rare min A Baseline:  Goal status: MET  4.  Pt will demonstrate clear phonation 18/20 sentences Baseline:  Goal status: MET  5.  Pt will demonstrate no vocal/neck strain when projecting volume in noisy environment or at a distance with occasional min A Baseline:  Goal status: MET  6.  Pt will swallow frequently to eliminate throat clears and reduce drool by 50% with occasional min A Baseline:  Goal status: MET  LONG TERM GOALS: Target date: 08/16/23  Pt will complete HEP for voice with mod I Baseline:  Goal status: ONGOING  2.  Pt will report 50% reduction in need to repeat himself over 1 week Baseline:  Goal status: ONGOING  3.  Pt will maintain clear phonation over 15 minute conversation with mod I Baseline:  Goal status: ONGOING  4.  Pt will be intelligible in noisy environment/while walking over 15 minute conversation with rare min A Baseline:  Goal status: ONGOING  5.  Pt will improve score on VRQOL by 4 points  Baseline:  Goal status: ONGOING  ASSESSMENT:  CLINICAL IMPRESSION: Patient is a 77 y.o. male who was seen today for dysphonia, R vocal fold paralysis after carotid tumor resection, s/p augmentation in 2008. Conducted ongoing education and instruction of voice exercises and swallow recommendations based on MBSS results. I recommend skilled ST to maximize intelligibility, voice quality and safety of swallow for independence and QOL.   OBJECTIVE IMPAIRMENTS: include voice disorder and dysphagia. These impairments are limiting patient from effectively communicating at home and in community and safety when swallowing. Factors affecting potential to achieve goals and functional outcome are previous level of function.. Patient will benefit from skilled SLP services to address above impairments and improve overall function.  REHAB POTENTIAL:  Good  PLAN:  SLP FREQUENCY: 1-2x/week  SLP DURATION: 8 weeks  PLANNED INTERVENTIONS: Aspiration precaution training, Pharyngeal strengthening exercises, Diet toleration management , Environmental controls, Trials of upgraded texture/liquids, Cueing hierachy, Internal/external aids, Multimodal communication approach, SLP instruction and feedback, Compensatory strategies, and Patient/family education, MBSS vs FEES    Gracy Racer, CCC-SLP 07/31/2023, 7:56 AM

## 2023-08-23 ENCOUNTER — Ambulatory Visit: Payer: No Typology Code available for payment source | Attending: Family Medicine | Admitting: Speech Pathology

## 2023-08-23 ENCOUNTER — Encounter: Payer: Self-pay | Admitting: Speech Pathology

## 2023-08-23 DIAGNOSIS — R1312 Dysphagia, oropharyngeal phase: Secondary | ICD-10-CM | POA: Diagnosis present

## 2023-08-23 DIAGNOSIS — R498 Other voice and resonance disorders: Secondary | ICD-10-CM | POA: Diagnosis present

## 2023-08-23 NOTE — Patient Instructions (Signed)
Practice 10 abdominal breaths  Straw phonation  2 minutes prior to PHoRTE and throughout the day and before any prolonged speaking    PHoRTE - twice a day    10 Loud AH's as loud as you can and as long as you can   2. 10 Pitch glides up, 10 pitch glides down   3. 10 sentences in loud high pitch voice, like you are calling your neighbor over the phone  4. 10 sentences in loud low authoritative pitch, like you are the boss  5. Read 10 sentences with powerful voice  When you go out to eat - try to sit with a wall to your back to a wall or in a corner to reduce some of the noise  Try a visual hand signal that you are going to talk - hand up or wave or "Hey"  or "ma'am" or "excuse me"  or  "boys"  or "Bill" to make sure you have others attention before you speak  Let others know immediately (like at the doctor's) "I have a hearing loss, you will need to speak up and look at me when you talk"  Family:  Chris Welch is trying hard to communicate - he has difficulty  because of his hearing and voice. He gives up if he is not successful, but he really wants to participate in conversations  He will use a hand signal or a name to get your attention -   Get the Chris Welch  attention before you speak  Use eye contact and face the person you are speaking to  Be in close proximity to the person you are speaking to  Turn down any noise in the environment such as the TV, walk away from loud appliances, air conditioners, fans, dish washers etc  In large gatherings, sit or stay on the side not the center of the room  Try to sit with a wall behind you or in a corner so noise isn't coming at you from all directions when dining out or attending gatherings  Chris Welch is trying to use a stronger, louder voice - he will use a visual sign he is going to talk          Keep reducing throat clears

## 2023-08-23 NOTE — Addendum Note (Signed)
Addended by: Clinton Sawyer A on: 08/23/2023 10:17 AM   Modules accepted: Orders

## 2023-08-23 NOTE — Therapy (Addendum)
OUTPATIENT SPEECH LANGUAGE PATHOLOGY VOICE THERAPY AND RECERT   Patient Name: Chris Welch MRN: [id number] DOB:06/04/1946, 77 y.o., male Today's Date: 08/23/2023  PCP: Pearline Cables, MD REFERRING PROVIDER: Dorna Leitz, MD  END OF SESSION:  End of Session - 08/23/23 1014     Visit Number 5    Number of Visits 17    Date for SLP Re-Evaluation 10/04/23   recert completed 08/23/23   [facility] Type [facility] - [facility] visits through 11/03/23    Authorization - Visit Number 5    Authorization - Number of Visits 15    SLP Start Time 0845    SLP Stop Time  0930    SLP Time Calculation (min) 45 min    Activity Tolerance Patient tolerated treatment well              Past Medical History:  Diagnosis Date   Adenomatous colon polyp 2009   Anemia    Anxiety    Aortic atherosclerosis (HCC)    Arthritis    "hips, knees" (01/27/2016)   Complication of anesthesia    bad dreams-OCC PASSES OUT AFTER ANETHESIA   Depression    Diverticulitis    Diverticulosis    Ear infection ear infection lasting past 2 months, still ongoing, pt on antibiotics & drops. 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   Right Cervical Paraganglioma   Vocal cord paralysis    Right   Past Surgical History:  Procedure Laterality Date   APPENDECTOMY     BACK SURGERY     LOWER   CAROTID BODY TUMOR EXCISION     CERVICAL PARAGANGLIOMA EXCISION Right 12/2006   CHOLECYSTECTOMY OPEN     CYST EXCISION  Left 12/17/2019   Procedure: CYST EXCISION; EXCISION NAIL HORN REMNANT LEFT RING FINGER;  Surgeon: Cindee Salt, MD;  Location: [facility];  Service: Orthopedics;  Laterality: Left;  IV REGIONAL   CYSTOSCOPY WITH INSERTION OF UROLIFT N/A 10/12/2020   Procedure: CYSTOSCOPY WITH INSERTION OF UROLIFT;  Surgeon: Vanna Scotland, MD;  Location: [facility];  Service: Urology;  Laterality: N/A;   DEEP NECK LYMPH NODE BIOPSY / EXCISION     DIGIT NAIL REMOVAL Left 07/30/2019   Procedure: REMOVAL OF NAIL HORNS LEFT RING FINGER;  Surgeon: Cindee Salt, MD;  Location: [facility];  Service: Orthopedics;  Laterality: Left;  IV REGIONAL FOREARM BLOCK   ESOPHAGOGASTRODUODENOSCOPY (EGD) WITH ESOPHAGEAL DILATION     INGUINAL HERNIA REPAIR Right 1954   INGUINAL HERNIA REPAIR Left 10/15/2021   Procedure: OPEN LEFT INGUINAL HERNIA REPAIR;  Surgeon: Fritzi Mandes, MD;  Location: [facility];  Service: General;  Laterality: Left;   INSERTION OF MESH N/A 10/15/2021   Procedure: INSERTION OF MESH;  Surgeon: Fritzi Mandes, MD;  Location: [facility] OR;  Service: General;  Laterality: N/A;  JOINT REPLACEMENT     KNEE ARTHROSCOPY Right    LUMBAR LAMINECTOMY/DECOMPRESSION MICRODISCECTOMY Left 03/09/2013   Procedure: LUMBAR LAMINECTOMY/DECOMPRESSION MICRODISCECTOMY 1 LEVEL;  Surgeon: Tia Alert, MD;  Location: [facility];  Service: Neurosurgery;  Laterality: Left;  Left lumbar three-four extra-foraminal microdiscectomy   PATELLA ARTHROPLASTY Right    has had 5 surgeries right knee   TOTAL HIP ARTHROPLASTY Left 01/27/2016   TOTAL HIP ARTHROPLASTY Left 01/27/2016   Procedure: TOTAL HIP ARTHROPLASTY ANTERIOR APPROACH;  Surgeon: Gean Birchwood, MD;  Location: [facility];  Service: Orthopedics;  Laterality: Left;   UPPER GASTROINTESTINAL ENDOSCOPY     VOCAL CORD IMPLANT     Patient Active Problem List   Diagnosis Date Noted   Degenerative joint disease (DJD) of hip 01/05/2016   Atypical chest pain 04/22/2014    Gastric reflux with aspiration 03/04/2014   Carpal tunnel syndrome 11/26/2013   HTN (hypertension) 11/26/2013   AR (allergic rhinitis) 11/26/2013   Low sodium levels 03/13/2013   BPH associated with nocturia 08/21/2012   Hyperlipidemia 08/21/2012   Recurrent aspiration bronchitis/pneumonia 10/05/2011   Diverticulosis 10/05/2011   Hearing loss of both ears 10/05/2011   Depression 10/05/2011   Vocal cord paralysis 01/17/2007    Onset date: 06/08/2023 (referral date)  REFERRING DIAG: J38.00 (ICD-10-CM) - Vocal cord paralysis  THERAPY DIAG:  Other voice and resonance disorders  Dysphagia, oropharyngeal phase  Rationale for Evaluation and Treatment: Rehabilitation  SUBJECTIVE:   SUBJECTIVE STATEMENT: "People can't hear me and I get depressed then I stop talking" Pt accompanied by: self  PERTINENT HISTORY: Chris Welch" Chris Welch is referred to ST by Dr. Lucky Cowboy[facility] Dr. Gerilyn Pilgrim is following him for hearing loss and chronic otitis externa. Chris Welch had Right carotid tumor rescetion in 2008, resulting in right vocal fold paralysis. At that time, he had vocal fold augmentation by Dr. Delford Field at [facility] as well as swallowing therapy.  He reports no laryngoscopy since 2008. Chris Welch reported that Dr. Delford Field informed him he may need a revision in the future. Chris Welch reports inconsistent voice quality, variable hoarseness and difficulty being heard in noisy situations. He reports he occasionally has food "get stuck" in right side of his pharynx and he coughs and brings it back up. He did have EDG recently with stretching which improved his swallowing. Denies difficulty with pills, recurrent respiratory illness or pna and no weight loss. Chris Welch endorses "getting choked" on "nasal drip" and pooling of saliva in his throat as well as drool. He is numb on right side of his tongue and neck.  PAIN:  Are you having pain? No  FALLS: Has patient fallen in last 6 months? No,  LIVING  ENVIRONMENT: Lives with: lives with their spouse Lives in: House/apartment  PLOF:Level of assistance: Independent with ADLs, Independent with IADLs Employment: Retired  PATIENT GOALS: "to be heard"  OBJECTIVE:  Note: Objective measures were completed at Evaluation unless otherwise noted.  DIAGNOSTIC: MBSS (07/24/23) Clinical Impression: Pt presents with a pharyngeal more than oral dysphagia that he reports to be chronic in nature. He has R buccal pocketing of barium and saliva but lingual transport is relatively swift. He has minimal pharyngeal squeeze and almost no epiglottic inversion. A prominent CP impacts bolus clearance into the UES as well, and he has moderate amounts of residue. This fills his pyriform sinuses but is also noted along his R lateral channel. He has fairly consistent, trace penetration with thin liquids that reaches his vocal folds. It was never observed  to fall below them, but he almost never cleared them spontaneously (PAS 5). A cued throat clear was consistently effective at clearing his airway, but he said that he's been trying not to clear his throat from a vocal hygiene standpoint. A head turn to the R and a breath hold were both ineffective at increasing airway protection, but a L head tilt resulted in improved pharyngeal clearance and more shallow penetration with thin liquids. He used liquid washes to reduce pharyngeal residue from solids. Given that pt denies any acute changes and has no recent h/o PNA, he is not interested in any diet modifications at this time. Education was provided about continuing to stay active and keeping up with oral hygiene. We did discuss trying alternative strategies, such as a L head tilt, instead of throat clearing, but overall he is hesitant to make any changes because he feels like what he has been doing has been effective and it is easy for him to remember.   TODAY'S TREATMENT:                                                                                                                                           08/23/23: Ripken enters verbalizing frustration with not being heard - reporting frustration and depression when he is not heard by his family and at appointments. This is exacerbated by his hearing loss, resulting in significant difficulty communicating. Targeted speaking with intent, using powerful voice. This does reduce or eliminate hoarse voice. He required occasional modeling and verbal cues to complete HEP accurately (PhoRTE) as he was doing sustained vowel 2-3x rather than 10x and was not completing pitch glides. During conversation, Chris Welch maintained intentional speech with rare min A for the duration of the session, to maintain 70-74dB and minimize hoarse voice. Today we generated strategies to improve communication including using a visual or verbal cue to let others know he is going to talk, such as using a hand gesture, name, or saying "excuse me" or "ma'am" when he is at a business or appointment to ensure the communication partner is attending to [place] before he speaks.  Self advocating by letting others know he has a hearing loss and that they need to face him and speak loudly at the beginning of an interaction in the community and reminding his family of this as well. And modifying environmental noises as he is able. This week he will continue to be deliberate about speaking with intent as well as try these strategies. He is using head tile and independently reports he is eating slower and swallowing with intent as well.   07/31/23: Educated pt on MBSS results and recommendations (see above), including intermittent throat clearing for air protection (only during PO intake) and multiple swallows & liquid washes for residue clearance. Pt feels he is already independently compensating and modifying diet appropriately. Recommended pt continue trained swallow exercises  from last session to maximize swallow function. Educated  pt on 3 pillars of PNA, with endorsement of adequate oral care and good health status despite presence of oropharyngeal dysphagia. Answered all questions to pt satisfaction re: MBSS and recs. Overall pt presented with clear vocal quality ~80-85% of session with some intermittent hoarseness. Reported significant hearing impairment impacts auditory feedback and analysis of his vocal quality. Reviewed PhoRTE exercises, with usual min cues required to optimize vocal intensity for optimal quality. Throat clears x2 exhibited this session, with no drooling evidenced.   07/19/23: Chris Welch has seen Dr. Feliberto Harts - he is aware that he has MBSS on Monday. He is completing HEP for voice with success. Initiated training in basic swallow exercises - will modify after MBSS. He completed effortful swallow masako and Mendelssohn with occasional min verbal cues and modeling. He is questioning ENT's dx of LPR - reviewed LPR education, provided handout. Reflux precaution verbalized with rare min A.   10/16/24[name] cancelled his MBSS as he does not want to swallow barium. He doesn't feel like he has difficulty swallowing. He sees ENT next week, defer swallow study at this time. Chris Welch enters room with hoarse voice. He is completing HEP for voice, with success with clear voice some days, then persistent hoarseness other days. Today, he completed PhoRTE with rare min A, achieving clear phonation with focus on volume/intent. In structured speech tasks, he maintained clear phonation 20/20 sentences and during 8 minute conversation with supervision cues he maintained volume of 72-74dB and clear phonation. Chris Welch continues to require verbal and visual cues to ID throat clears. Harsh throat clearing 10+ times this session. Ongoing training in throat clear alternatives and suppression. He was able to avoid throat clearing 3x after review of throat clear alternatives. He endorses clearing his throat to clear his hoarse voice. Extensive  education re: cycle of how throat clearing increases phlegm and how throat clearing contributes to vocal fold irritation and his hoarseness. He sees Dr. Irene Pap next Wednesday. Will modify treatment/HEP as needed pending results of this visit. Cough suppression strategies next session.  06/21/23 (eval day): Initiated training to use breath support rather than vocal strain to be heard over noise or across a room. Initiated training in throat clear alternatives and more frequent swallowing to reduce drool and coughing on perceived pooling of secretions in his pharynx. Reviewed basic swallow precautions, which he is following. Educated re: need to see a Scientist, forensic to assess status of vocal folds and prior vocal fold augmentation since he has not had laryngoscopy since 2008. Provided name  & number of Dr. Irene Pap. Will request referral from Dr. Alita Chyle. Initiated training in HEP, PhoRTE, for dysphonia. Will modify as needed pending laryngology consult.   PATIENT EDUCATION: Education details: See Today's Treatment, See Patient Instructions, vocal hygiene, swallow precautions, HEP for voice, Person educated: Patient Education method: Explanation, Demonstration, Verbal cues, and Handouts Education comprehension: verbalized understanding, returned demonstration, verbal cues required, and needs further education  HOME EXERCISE PROGRAM: PhoRTE pending laryngology consult.  GOALS: Goals reviewed with patient? Yes  SHORT TERM GOALS: Target date: 07/19/23  Pt will complete HEP for voice with rare min A Baseline: Goal status: PARTIALLY MET  2.  Pt will report 25% reduction in requests for repetition subjectively over 1 week Baseline:  Goal status: MET  3.  Pt will follow swallow precautions/diet recommendations following MBSS with rare min A Baseline:  Goal status: MET  4.  Pt will demonstrate clear phonation 18/20 sentences Baseline:  Goal status: MET  5.  Pt will demonstrate no  vocal/neck strain when projecting volume in noisy environment or at a distance with occasional min A Baseline:  Goal status: MET  6.  Pt will swallow frequently to eliminate throat clears and reduce drool by 50% with occasional min A Baseline:  Goal status: MET  LONG TERM GOALS: Target date: 08/16/23  Pt will complete HEP for voice with mod I Baseline:  Goal status: ONGOING  2.  Pt will report 50% reduction in need to repeat himself over 1 week Baseline:  Goal status: ONGOING  3.  Pt will maintain clear phonation over 15 minute conversation with mod I Baseline:  Goal status: ONGOING  4.  Pt will be intelligible in noisy environment/while walking over 15 minute conversation with rare min A Baseline:  Goal status: ONGOING  5.  Pt will improve score on VRQOL by 4 points  Baseline:  Goal status: ONGOING  ASSESSMENT:  CLINICAL IMPRESSION: Patient is a 77 y.o. male who was seen today for dysphonia, R vocal fold paralysis after carotid tumor resection, s/p augmentation in 2008. Conducted ongoing education and instruction of voice exercises and swallow recommendations based on MBSS results. I recommend skilled ST to maximize intelligibility, voice quality and safety of swallow for independence and QOL.   OBJECTIVE IMPAIRMENTS: include voice disorder and dysphagia. These impairments are limiting patient from effectively communicating at home and in community and safety when swallowing. Factors affecting potential to achieve goals and functional outcome are previous level of function.. Patient will benefit from skilled SLP services to address above impairments and improve overall function.  REHAB POTENTIAL: Good  PLAN:  SLP FREQUENCY: 1-2x/week  SLP DURATION: 8 weeks  PLANNED INTERVENTIONS: Aspiration precaution training, Pharyngeal strengthening exercises, Diet toleration management , Environmental controls, Trials of upgraded texture/liquids, Cueing hierachy, Internal/external  aids, Multimodal communication approach, SLP instruction and feedback, Compensatory strategies, and Patient/family education, MBSS vs FEES    Simra Fiebig, Radene Journey, CCC-SLP 08/23/2023, 10:16 AM

## 2023-08-30 ENCOUNTER — Encounter: Payer: Self-pay | Admitting: Speech Pathology

## 2023-08-30 ENCOUNTER — Ambulatory Visit: Payer: No Typology Code available for payment source | Admitting: Speech Pathology

## 2023-08-30 ENCOUNTER — Ambulatory Visit (INDEPENDENT_AMBULATORY_CARE_PROVIDER_SITE_OTHER): Payer: No Typology Code available for payment source | Admitting: Otolaryngology

## 2023-08-30 DIAGNOSIS — R498 Other voice and resonance disorders: Secondary | ICD-10-CM

## 2023-08-30 DIAGNOSIS — R1312 Dysphagia, oropharyngeal phase: Secondary | ICD-10-CM

## 2023-08-30 NOTE — Therapy (Signed)
OUTPATIENT SPEECH LANGUAGE PATHOLOGY VOICE THERAPY AND RECERT   Patient Name: Chris Welch MRN: [id number] DOB:12-Nov-1945, 77 y.o., male Today's Date: 08/30/2023  PCP: Chris Cables, MD REFERRING PROVIDER: Dorna Leitz, MD  END OF SESSION:  End of Session - 08/30/23 0851     Visit Number 6    Number of Visits 17    Date for SLP Re-Evaluation 10/04/23    [facility]ization Type [facility] - [facility] visits through 11/03/23    Authorization - Visit Number 6    Authorization - Number of Visits 15    SLP Start Time 0850    SLP Stop Time  0930    SLP Time Calculation (min) 40 min    Activity Tolerance Patient tolerated treatment well              Past Medical History:  Diagnosis Date   Adenomatous colon polyp 2009   Anemia    Anxiety    Aortic atherosclerosis (HCC)    Arthritis    "hips, knees" (01/27/2016)   Complication of anesthesia    bad dreams-OCC PASSES OUT AFTER ANETHESIA   Depression    Diverticulitis    Diverticulosis    Ear infection ear infection lasting past 2 months, still ongoing, pt on antibiotics & drops. 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   Right Cervical Paraganglioma   Vocal cord paralysis    Right   Past Surgical History:  Procedure Laterality Date   APPENDECTOMY     BACK SURGERY     LOWER   CAROTID BODY TUMOR EXCISION     CERVICAL PARAGANGLIOMA EXCISION Right 12/2006   CHOLECYSTECTOMY OPEN     CYST EXCISION Left 12/17/2019    Procedure: CYST EXCISION; EXCISION NAIL HORN REMNANT LEFT RING FINGER;  Surgeon: Chris Salt, MD;  Location: [facility];  Service: Orthopedics;  Laterality: Left;  IV REGIONAL   CYSTOSCOPY WITH INSERTION OF UROLIFT N/A 10/12/2020   Procedure: CYSTOSCOPY WITH INSERTION OF UROLIFT;  Surgeon: Chris Scotland, MD;  Location: [facility];  Service: Urology;  Laterality: N/A;   DEEP NECK LYMPH NODE BIOPSY / EXCISION     DIGIT NAIL REMOVAL Left 07/30/2019   Procedure: REMOVAL OF NAIL HORNS LEFT RING FINGER;  Surgeon: Chris Salt, MD;  Location: [facility];  Service: Orthopedics;  Laterality: Left;  IV REGIONAL FOREARM BLOCK   ESOPHAGOGASTRODUODENOSCOPY (EGD) WITH ESOPHAGEAL DILATION     INGUINAL HERNIA REPAIR Right 1954   INGUINAL HERNIA REPAIR Left 10/15/2021   Procedure: OPEN LEFT INGUINAL HERNIA REPAIR;  Surgeon: Chris Mandes, MD;  Location: [facility];  Service: General;  Laterality: Left;   INSERTION OF MESH N/A 10/15/2021   Procedure: INSERTION OF MESH;  Surgeon: Chris Mandes, MD;  Location: [facility];  Service: General;  Laterality: N/A;   JOINT REPLACEMENT  KNEE ARTHROSCOPY Right    LUMBAR LAMINECTOMY/DECOMPRESSION MICRODISCECTOMY Left 03/09/2013   Procedure: LUMBAR LAMINECTOMY/DECOMPRESSION MICRODISCECTOMY 1 LEVEL;  Surgeon: Chris Alert, MD;  Location: [facility];  Service: Neurosurgery;  Laterality: Left;  Left lumbar three-four extra-foraminal microdiscectomy   PATELLA ARTHROPLASTY Right    has had 5 surgeries right knee   TOTAL HIP ARTHROPLASTY Left 01/27/2016   TOTAL HIP ARTHROPLASTY Left 01/27/2016   Procedure: TOTAL HIP ARTHROPLASTY ANTERIOR APPROACH;  Surgeon: Chris Birchwood, MD;  Location: [facility];  Service: Orthopedics;  Laterality: Left;   UPPER GASTROINTESTINAL ENDOSCOPY     VOCAL CORD IMPLANT     Patient Active Problem List   Diagnosis Date Noted   Degenerative joint disease (DJD) of hip 01/05/2016   Atypical chest pain 04/22/2014   Gastric reflux with  aspiration 03/04/2014   Carpal tunnel syndrome 11/26/2013   HTN (hypertension) 11/26/2013   AR (allergic rhinitis) 11/26/2013   Low sodium levels 03/13/2013   BPH associated with nocturia 08/21/2012   Hyperlipidemia 08/21/2012   Recurrent aspiration bronchitis/pneumonia 10/05/2011   Diverticulosis 10/05/2011   Hearing loss of both ears 10/05/2011   Depression 10/05/2011   Vocal cord paralysis 01/17/2007    Onset date: 06/08/2023 (referral date)  REFERRING DIAG: J38.00 (ICD-10-CM) - Vocal cord paralysis  THERAPY DIAG:  Other voice and resonance disorders  Dysphagia, oropharyngeal phase  Rationale for Evaluation and Treatment: Rehabilitation  SUBJECTIVE:   SUBJECTIVE STATEMENT: "This is helping me - I have been speaking louder" Pt accompanied by: self  PERTINENT HISTORY: Chris Nevins" Welch is referred to ST by Dr. [name] Dr. Gerilyn Welch is following him for hearing loss and chronic otitis externa. Chris Welch had Right carotid tumor rescetion in 2008, resulting in right vocal fold paralysis. At that time, he had vocal fold augmentation by Dr. Delford Welch at [facility] as well as swallowing therapy.  He reports no laryngoscopy since 2008. Chris Welch reported that Dr. Delford Welch informed him he may need a revision in the future. Chris Welch reports inconsistent voice quality, variable hoarseness and difficulty being heard in noisy situations. He reports he occasionally has food "get stuck" in right side of his pharynx and he coughs and brings it back up. He did have EDG recently with stretching which improved his swallowing. Denies difficulty with pills, recurrent respiratory illness or pna and no weight loss. Chris Welch endorses "getting choked" on "nasal drip" and pooling of saliva in his throat as well as drool. He is numb on right side of his tongue and neck.  PAIN:  Are you having pain? No  FALLS: Has patient fallen in last 6 months? No,  LIVING ENVIRONMENT: Lives with: lives with their  spouse Lives in: House/apartment  PLOF:Level of assistance: Independent with ADLs, Independent with IADLs Employment: Retired  PATIENT GOALS: "to be heard"  OBJECTIVE:  Note: Objective measures were completed at Evaluation unless otherwise noted.  DIAGNOSTIC: MBSS (07/24/23) Clinical Impression: Pt presents with a pharyngeal more than oral dysphagia that he reports to be chronic in nature. He has R buccal pocketing of barium and saliva but lingual transport is relatively swift. He has minimal pharyngeal squeeze and almost no epiglottic inversion. A prominent CP impacts bolus clearance into the UES as well, and he has moderate amounts of residue. This fills his pyriform sinuses but is also noted along his R lateral channel. He has fairly consistent, trace penetration with thin liquids that reaches his vocal folds. It was never observed to fall below them, but he almost never  cleared them spontaneously (PAS 5). A cued throat clear was consistently effective at clearing his airway, but he said that he's been trying not to clear his throat from a vocal hygiene standpoint. A head turn to the R and a breath hold were both ineffective at increasing airway protection, but a L head tilt resulted in improved pharyngeal clearance and more shallow penetration with thin liquids. He used liquid washes to reduce pharyngeal residue from solids. Given that pt denies any acute changes and has no recent h/o PNA, he is not interested in any diet modifications at this time. Education was provided about continuing to stay active and keeping up with oral hygiene. We did discuss trying alternative strategies, such as a L head tilt, instead of throat clearing, but overall he is hesitant to make any changes because he feels like what he has been doing has been effective and it is easy for him to remember.   TODAY'S TREATMENT:                                                                                                                                           08/30/23: Traxton reports consistently speaking loud (with intent)  and reports improved success communicating on the golf course at a distance . He reports 50% reduction in the need to repeat himself overall (subjectively). Demetruis reports improved success being understood over the phone as well as he is speaking with intent and volume. He continues to report difficulty being heard in noisy places. He completed HEP with mod I and is completing this 1-2x a day. In conversation, Chris Welch maintained 70-74dB with supervision cues.   08/23/23: Jeannett Senior enters verbalizing frustration with not being heard - reporting frustration and depression when he is not heard by his family and at appointments. This is exacerbated by his hearing loss, resulting in significant difficulty communicating. Targeted speaking with intent, using powerful voice. This does reduce or eliminate hoarse voice. He required occasional modeling and verbal cues to complete HEP accurately (PhoRTE) as he was doing sustained vowel 2-3x rather than 10x and was not completing pitch glides. During conversation, Chris Welch maintained intentional speech with rare min A for the duration of the session, to maintain 70-74dB and minimize hoarse voice. Today we generated strategies to improve communication including using a visual or verbal cue to let others know he is going to talk, such as using a hand gesture, name, or saying "excuse me" or "ma'am" when he is at a business or appointment to ensure the communication partner is attending to [place] before he speaks.  Self advocating by letting others know he has a hearing loss and that they need to face him and speak loudly at the beginning of an interaction in the community and reminding his family of this as well. And modifying environmental noises as he is able. This week he will continue to be  deliberate about speaking with intent as well as try these strategies. He is using head tilt  and independently reports he is eating slower and swallowing with intent as well.   07/31/23: Educated pt on MBSS results and recommendations (see above), including intermittent throat clearing for air protection (only during PO intake) and multiple swallows & liquid washes for residue clearance. Pt feels he is already independently compensating and modifying diet appropriately. Recommended pt continue trained swallow exercises from last session to maximize swallow function. Educated pt on 3 pillars of PNA, with endorsement of adequate oral care and good health status despite presence of oropharyngeal dysphagia. Answered all questions to pt satisfaction re: MBSS and recs. Overall pt presented with clear vocal quality ~80-85% of session with some intermittent hoarseness. Reported significant hearing impairment impacts auditory feedback and analysis of his vocal quality. Reviewed PhoRTE exercises, with usual min cues required to optimize vocal intensity for optimal quality. Throat clears x2 exhibited this session, with no drooling evidenced.   07/19/23: Chris Welch has seen Dr. Feliberto Harts - he is aware that he has MBSS on Monday. He is completing HEP for voice with success. Initiated training in basic swallow exercises - will modify after MBSS. He completed effortful swallow masako and Mendelssohn with occasional min verbal cues and modeling. He is questioning ENT's dx of LPR - reviewed LPR education, provided handout. Reflux precaution verbalized with rare min A.   10/16/24[name] cancelled his MBSS as he does not want to swallow barium. He doesn't feel like he has difficulty swallowing. He sees ENT next week, defer swallow study at this time. Chris Welch enters room with hoarse voice. He is completing HEP for voice, with success with clear voice some days, then persistent hoarseness other days. Today, he completed PhoRTE with rare min A, achieving clear phonation with focus on volume/intent. In structured speech tasks, he  maintained clear phonation 20/20 sentences and during 8 minute conversation with supervision cues he maintained volume of 72-74dB and clear phonation. Chris Welch continues to require verbal and visual cues to ID throat clears. Harsh throat clearing 10+ times this session. Ongoing training in throat clear alternatives and suppression. He was able to avoid throat clearing 3x after review of throat clear alternatives. He endorses clearing his throat to clear his hoarse voice. Extensive education re: cycle of how throat clearing increases phlegm and how throat clearing contributes to vocal fold irritation and his hoarseness. He sees Dr. Irene Pap next Wednesday. Will modify treatment/HEP as needed pending results of this visit. Cough suppression strategies next session.  06/21/23 (eval day): Initiated training to use breath support rather than vocal strain to be heard over noise or across a room. Initiated training in throat clear alternatives and more frequent swallowing to reduce drool and coughing on perceived pooling of secretions in his pharynx. Reviewed basic swallow precautions, which he is following. Educated re: need to see a Scientist, forensic to assess status of vocal folds and prior vocal fold augmentation since he has not had laryngoscopy since 2008. Provided name  & number of Dr. Irene Pap. Will request referral from Dr. Alita Chyle. Initiated training in HEP, PhoRTE, for dysphonia. Will modify as needed pending laryngology consult.   PATIENT EDUCATION: Education details: See Today's Treatment, See Patient Instructions, vocal hygiene, swallow precautions, HEP for voice, Person educated: Patient Education method: Explanation, Demonstration, Verbal cues, and Handouts Education comprehension: verbalized understanding, returned demonstration, verbal cues required, and needs further education  HOME EXERCISE PROGRAM: PhoRTE pending laryngology consult.  GOALS: Goals reviewed with  patient? Yes  SHORT TERM  GOALS: Target date: 07/19/23  Pt will complete HEP for voice with rare min A Baseline: Goal status: PARTIALLY MET  2.  Pt will report 25% reduction in requests for repetition subjectively over 1 week Baseline:  Goal status: MET  3.  Pt will follow swallow precautions/diet recommendations following MBSS with rare min A Baseline:  Goal status: MET  4.  Pt will demonstrate clear phonation 18/20 sentences Baseline:  Goal status: MET  5.  Pt will demonstrate no vocal/neck strain when projecting volume in noisy environment or at a distance with occasional min A Baseline:  Goal status: MET  6.  Pt will swallow frequently to eliminate throat clears and reduce drool by 50% with occasional min A Baseline:  Goal status: MET  LONG TERM GOALS: Target date: 10/03/22  Pt will complete HEP for voice with mod I Baseline:  Goal status: MET  2.  Pt will report 50% reduction in need to repeat himself over 1 week Baseline:  Goal status: MET  3.  Pt will maintain clear phonation over 15 minute conversation with mod I Baseline:  Goal status: MET  4.  Pt will be intelligible in noisy environment/while walking over 15 minute conversation with rare min A Baseline:  Goal status: ONGOING  5.  Pt will improve score on VRQOL by 4 points  Baseline:  Goal status: ONGOING  ASSESSMENT:  CLINICAL IMPRESSION: Patient is a 77 y.o. male who was seen today for dysphonia, R vocal fold paralysis after carotid tumor resection, s/p augmentation in 2008. Viviann Spare has made good progress on carrying over compensations for dysphonia including increasing volume, getting partner's attention before speaking and self advocating due to [facility]. He is completing HEP with mod I consistently and reports . I recommend skilled ST to maximize intelligibility, voice quality and safety of swallow for independence and QOL.   OBJECTIVE IMPAIRMENTS: include voice disorder and dysphagia. These impairments are limiting patient from  effectively communicating at home and in community and safety when swallowing. Factors affecting potential to achieve goals and functional outcome are previous level of function.. Patient will benefit from skilled SLP services to address above impairments and improve overall function.  REHAB POTENTIAL: Good  PLAN:  SLP FREQUENCY: 1-2x/week  SLP DURATION: 8 weeks  PLANNED INTERVENTIONS: Aspiration precaution training, Pharyngeal strengthening exercises, Diet toleration management , Environmental controls, Trials of upgraded texture/liquids, Cueing hierachy, Internal/external aids, Multimodal communication approach, SLP instruction and feedback, Compensatory strategies, and Patient/family education, MBSS vs FEES    Izabela Ow, Radene Journey, CCC-SLP 08/30/2023, 11:52 AM

## 2023-08-30 NOTE — Patient Instructions (Addendum)
   This week, think about swallowing before you speak  [place] job being deliberate and speaking with intent  Keep up the good volume on your exercises

## 2023-09-27 ENCOUNTER — Ambulatory Visit (INDEPENDENT_AMBULATORY_CARE_PROVIDER_SITE_OTHER): Payer: Medicare HMO | Admitting: Otolaryngology

## 2023-09-27 ENCOUNTER — Ambulatory Visit: Payer: No Typology Code available for payment source | Attending: Family Medicine | Admitting: Speech Pathology

## 2023-09-27 ENCOUNTER — Encounter (INDEPENDENT_AMBULATORY_CARE_PROVIDER_SITE_OTHER): Payer: Self-pay | Admitting: Otolaryngology

## 2023-09-27 ENCOUNTER — Encounter: Payer: Self-pay | Admitting: Speech Pathology

## 2023-09-27 VITALS — BP 152/90 | HR 85

## 2023-09-27 DIAGNOSIS — J383 Other diseases of vocal cords: Secondary | ICD-10-CM | POA: Diagnosis not present

## 2023-09-27 DIAGNOSIS — R0981 Nasal congestion: Secondary | ICD-10-CM | POA: Diagnosis not present

## 2023-09-27 DIAGNOSIS — R0982 Postnasal drip: Secondary | ICD-10-CM

## 2023-09-27 DIAGNOSIS — R1313 Dysphagia, pharyngeal phase: Secondary | ICD-10-CM | POA: Diagnosis not present

## 2023-09-27 DIAGNOSIS — K219 Gastro-esophageal reflux disease without esophagitis: Secondary | ICD-10-CM | POA: Diagnosis not present

## 2023-09-27 DIAGNOSIS — R498 Other voice and resonance disorders: Secondary | ICD-10-CM | POA: Insufficient documentation

## 2023-09-27 DIAGNOSIS — R490 Dysphonia: Secondary | ICD-10-CM | POA: Diagnosis not present

## 2023-09-27 DIAGNOSIS — R1312 Dysphagia, oropharyngeal phase: Secondary | ICD-10-CM | POA: Diagnosis present

## 2023-09-27 DIAGNOSIS — J3801 Paralysis of vocal cords and larynx, unilateral: Secondary | ICD-10-CM

## 2023-09-27 NOTE — Progress Notes (Signed)
 ENT Progress:  Update 09/27/23 Discussed the use of AI scribe software for clinical note transcription with the patient, who gave verbal consent to proceed.  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 They deny any history of strokes. The patient's main issue based on MBS results is a lack of pharyngeal squeeze, leading to a lack of clearance of swallowed substances.   The patient has been participating in swallowing therapy and reports improvement in swallowing and voice quality. They have been practicing swallowing hard and washing down excess saliva with water. They also report that they have not felt like they were going to choke on anything recently.  The patient has been taking medication to reduce saliva production (glyco), but reports that they often run out of this medication before they can get a refill. They note that when they take the medication regularly, they have less saliva.  The patient also reports that they had their esophagus dilated during a recent colonoscopy and endoscopy, and noticed an improvement in swallowing after this procedure. They have been staying active, playing golf three days a week, and maintaining good oral hygiene.    Initial Consult 07/12/23 Reason for Consult: dysphonia and hx of right vocal fold paralysis   HPI: Chris Welch is an 78 y.o. 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 He is also c/o chronic dysphagia sx, with food getting stuck in his throat, particularly right side of his throat. He feels he has to cough and at times coughs up what he swallowed. Recent EGD with dilation done per record review with some improvement in his sx.  Per report paraganglioma surgery affected his tongue and right vocal fold. He feels that the projection declined gradually. He has to cut up meat in small pieces. No dyspnea. No hx of stroke or heart attach. Mild coughing or choking with liquids or food at times. He reports post-nasal drip and pooling of saliva in the back of his throat. In therapy for voice and swallowing. Records review with   Records Reviewed:  SLP note 06/21/23 Chris Welch is referred to ST by Dr. Robbi Cadet, [facility]. Dr. Jacob is following him for hearing loss and chronic otitis externa. [name] had Right carotid tumor resection in 2008, resulting in right vocal fold paralysis. At that time, he had vocal fold augmentation by Dr. Brien at [facility]. He reports no laryngoscopy since 2008. [name] reported that Dr. Brien informed him he may need a revision in the future. [name] reports inconsistent voice quality, variable hoarseness and difficulty being heard in noisy situations. He reports he occasionally has food get stuck in right side of his pharynx and he coughs and brings it back up. He did have EDG recently with stretching which improved his swallowing. Denies difficulty with pills, recurrent respiratory illness or pna and no weight loss. [name] endorses getting choked on nasal drip and pooling  of saliva in his throat as well as drool. He is numb on right side of his tongue and neck.    Past Medical History:  Diagnosis Date   Adenomatous colon polyp 2009   Anemia    Anxiety    Aortic atherosclerosis (HCC)    Arthritis    hips, knees (01/27/2016)   Complication of anesthesia     bad dreams-OCC PASSES OUT AFTER ANETHESIA   Depression    Diverticulitis    Diverticulosis    Ear infection ear infection lasting past 2 months, still ongoing, pt on antibiotics & drops. 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   Right Cervical Paraganglioma   Vocal cord paralysis    Right    Past Surgical History:  Procedure Laterality Date   APPENDECTOMY     BACK SURGERY     LOWER   CAROTID BODY TUMOR EXCISION     CERVICAL PARAGANGLIOMA EXCISION Right 12/2006   CHOLECYSTECTOMY OPEN     CYST EXCISION Left 12/17/2019   Procedure: CYST EXCISION; EXCISION NAIL HORN REMNANT LEFT RING FINGER;  Surgeon: Murrell Kuba, MD;  Location: [facility];  Service: Orthopedics;  Laterality: Left;  IV REGIONAL   CYSTOSCOPY WITH INSERTION OF UROLIFT N/A 10/12/2020   Procedure: CYSTOSCOPY WITH INSERTION OF UROLIFT;  Surgeon: Penne Knee, MD;  Location: [facility];  Service: Urology;  Laterality: N/A;   DEEP NECK LYMPH NODE BIOPSY / EXCISION     DIGIT NAIL REMOVAL Left 07/30/2019   Procedure: REMOVAL OF NAIL HORNS LEFT RING FINGER;  Surgeon: Murrell Kuba, MD;  Location: [facility];  Service: Orthopedics;  Laterality: Left;  IV REGIONAL FOREARM BLOCK   ESOPHAGOGASTRODUODENOSCOPY (EGD) WITH ESOPHAGEAL DILATION     INGUINAL HERNIA REPAIR Right 1954   INGUINAL HERNIA REPAIR Left 10/15/2021   Procedure: OPEN LEFT INGUINAL HERNIA REPAIR;  Surgeon: Dasie Leonor CROME, MD;   Location: [facility] OR;  Service: General;  Laterality: Left;   INSERTION OF MESH N/A 10/15/2021   Procedure: INSERTION OF MESH;  Surgeon: Dasie Leonor CROME, MD;  Location: [facility] OR;  Service: General;  Laterality: N/A;   JOINT REPLACEMENT     KNEE ARTHROSCOPY Right    LUMBAR LAMINECTOMY/DECOMPRESSION MICRODISCECTOMY Left 03/09/2013   Procedure: LUMBAR LAMINECTOMY/DECOMPRESSION MICRODISCECTOMY 1 LEVEL;  Surgeon: Alm GORMAN Molt, MD;  Location: [facility];  Service: Neurosurgery;  Laterality: Left;  Left lumbar three-four extra-foraminal microdiscectomy   PATELLA ARTHROPLASTY Right    has had 5 surgeries right knee   TOTAL HIP ARTHROPLASTY Left 01/27/2016   TOTAL HIP ARTHROPLASTY Left 01/27/2016   Procedure: TOTAL HIP ARTHROPLASTY ANTERIOR APPROACH;  Surgeon: Dempsey Sensor, MD;  Location: [facility] OR;  Service: Orthopedics;  Laterality: Left;   UPPER GASTROINTESTINAL ENDOSCOPY     VOCAL CORD IMPLANT      Family History  Problem  Relation Age of Onset   Arthritis Brother        back   Emphysema Brother    Cancer Brother        rare cancer-unknown type   Heart disease Mother    Emphysema Father        smoker   Colon cancer Neg Hx    Esophageal cancer Neg Hx    Rectal cancer Neg Hx    Stomach cancer Neg Hx     Social History:  reports that he has never smoked. He has never used smokeless tobacco. He reports current alcohol use of about 3.0 standard drinks of alcohol per week. He reports that he does not use drugs.  Allergies:  Allergies  Allergen Reactions   Sulfa Antibiotics Hives   Hydrocodone  Nausea And Vomiting    can not take on an empty stomach   Penicillins Other (See Comments) and Rash    Ulcers on eyes    Medications: I have reviewed the patient's current medications.  The PMH, PSH, Medications, Allergies, and SH were reviewed and updated.  ROS: Constitutional: Negative for fever, weight loss and weight gain. Cardiovascular: Negative for chest pain and dyspnea on  exertion. Respiratory: Is not experiencing shortness of breath at rest. Gastrointestinal: Negative for nausea and vomiting. Neurological: Negative for headaches. Psychiatric: The patient is not nervous/anxious  Blood pressure (!) 152/90, pulse 85, SpO2 97%.  PHYSICAL EXAM:  Exam: General: Well-developed, well-nourished Communication and Voice: wet quality and mild dysarthria Respiratory Respiratory effort: Equal inspiration and expiration without stridor Cardiovascular Peripheral Vascular: Warm extremities with equal color/perfusion Eyes: No nystagmus with equal extraocular motion bilaterally Neuro/Psych/Balance: Patient oriented to person, place, and time; Appropriate mood and affect; Gait is intact with no imbalance; Cranial nerves I-XII are intact, but right lateral tongue with mild atrophyfasciculations and weakness Head and Face Inspection: Normocephalic and atraumatic without mass or lesion Palpation: Facial skeleton intact without bony stepoffs Salivary Glands: No mass or tenderness Facial Strength: Facial motility symmetric and full bilaterally ENT Pinna: External ear intact and fully developed External canal: Canal is patent with intact skin Tympanic Membrane: Clear and mobile External Nose: No scar or anatomic deformity Internal Nose: Septum is deviated to the left. No polyp, or purulence. Mucosal edema and erythema present.  Bilateral inferior turbinate hypertrophy.  Lips, Teeth, and gums: Mucosa and teeth intact and viable TMJ: No pain to palpation with full mobility Oral cavity/oropharynx: No erythema or exudate, no lesions present Nasopharynx: No mass or lesion with intact mucosa Hypopharynx: Intact mucosa with significant pooling of secretions R > L pyriforms Larynx Glottic: R VF paralysis small glottic gap present with adduction Supraglottic: Normal appearing epiglottis and AE folds Interarytenoid Space: No or minimal pachydermia or edema Subglottic Space:  Patent without lesion or edema Neck Neck and Trachea: Midline trachea without mass or lesion Thyroid: No mass or nodularity Lymphatics: No lymphadenopathy  Procedure: Summary of Video-Laryngeal-Stroboscopy: The true vocal cords with evidence of R VF paralysis. The medial edges were relatively straight, particularly on the right side.Closure was incomplete with small spindle-shaped glottic gap. Periodicity present. The mucosal wave and amplitude were intact and symmetric. There is moderate interarytenoid pachydermia and post cricoid edema. The mucosa appears healthy without lesions.   Preoperative diagnosis: hoarseness + R VF paralysis and hx of medialization thyroplasty  Postoperative diagnosis:   same + PND and GERD/LPR  Procedure: Flexible fiberoptic laryngoscopy with stroboscopy (68420)  Surgeon: Elena Larry, MD  Anesthesia: Topical lidocaine  and Afrin  Complications:  None  Condition is stable throughout exam  Indications and consent:   The patient presents to the clinic with hoarseness. All the risks, benefits, and potential complications were reviewed with the patient preoperatively and informed verbal consent was obtained.  Procedure: The patient was seated upright in the exam chair.   Topical lidocaine  and Afrin were applied to the nasal cavity. After adequate anesthesia had occurred, the flexible telescope was passed into the nasal cavity. The nasopharynx was patent without mass or lesion. The scope was passed behind the soft palate and directed toward the base of tongue. The base of tongue was visualized and was symmetric with no apparent masses or abnormal appearing tissue. There were no signs of a mass or pooling of secretions in the piriform sinuses. The supraglottic structures were normal.  The true vocal cords with evidence of R VF paralysis. The medial edges were relatively straight, particularly on the right side. Closure was incomplete with small spindle-shaped  glottic gap. Periodicity present. The mucosal wave and amplitude were intact and symmetric. There is moderate interarytenoid pachydermia and post cricoid edema. The mucosa appears healthy without lesions.   The laryngoscope was then slowly withdrawn and the patient tolerated the procedure well. There were no complications or blood loss.  Studies Reviewed:CT chest 06/05/20 Lungs/Pleura: Mild centrilobular and ground-glass nodularity within the RIGHT middle lobe; this is similar to 2011. RIGHT middle lobe atelectasis. RIGHT upper lobe centrilobular ground-glass. No pleural effusion or pneumothorax.   Upper Abdomen: Trace pneumobilia. Hepatic steatosis. No acute abnormality.   Musculoskeletal: No chest wall abnormality. No acute or significant osseous findings.   IMPRESSION: 1. There is a prominent lobulated azygos with a punctate focus of increased density which may reflect contrast versus calcification. This may correlate to outside reported radiograph finding. Outside radiograph is not available for review. 2. Mild centrilobular and ground-glass nodularity within the RIGHT middle lobe and RIGHT upper lobe, similar to 2011. Findings are most suggestive of an infectious or inflammatory process.  CT neck     Assessment/Plan: Encounter Diagnoses  Name Primary?   Dysphonia Yes   Vocal fold paralysis, right    Age-related vocal fold atrophy    Glottic insufficiency    Chronic nasal congestion    Post-nasal drip    Pharyngeal dysphagia    Chronic GERD     78 year old male with history of right paraganglioma resection and right vocal fold and tongue weakness postop in 2008, reportedly underwent augmentation procedure for right vocal fold at [facility] back then, unfortunately records are not available for review at this time, also being seen by ENT at the [facility], and reportedly had diagnosis of OSA, but not able to tolerate CPAP currently.  He is here for multiple complaints including  intermittent and gradually worsening dysphonia and poor projection as well as swallowing problems.  Had EGD and stretching done recently per record review.  In voice and swallow therapy currently.  He recalls that he was previously told that his vocal fold surgery will most likely require revision down the line to resize the implant.  Not sure what type of implant or surgery he had done at the time.  Feels that his right tongue is numb but overall movement is intact.   Chronic gradually worsening dysphonia in the setting of right vocal fold paralysis and tongue weakness following resection of right cervical paraganglioma in 2008 - we will request records from [place] (sleep study) and from [facility] about vocal fold surgery done in 2008 -Videostrobe today  showed right vocal fold paralysis and incomplete closure with glottic insufficiency as well as vocal fold atrophy bilaterally but mucosal wave appeared to be symmetric and intact, there was no pooling of secretions in piriforms -We discussed that vocal fold implant surgery does require revision later on due to progressive vocal fold atrophy and the need for a larger implant over time -Will order CT neck with contrast fine cuts to evaluate for presence of/ position of the right vocal fold implant and the size of the implant he likely had done in 2008 at [facility] -Continue voice and swallow therapy with speech  2.  Dysphagia oropharyngeal versus esophageal unclear at this time, mostly to solid foods, no weight loss or pneumonia, eating regular diet currently, had recent EGD and dilation.  He previously canceled his swallow assessment because he does not like to swallow barium. - schedule swallow study - MBS and esophagram  3.  Chronic nasal congestion postnasal drainage -I did not see pooling of secretions on scope exam today, but he had evidence of mucosal edema and clear postnasal drainage.  Suspect environmental allergies.  -Trial of Flonase  2 puffs bilateral  nares twice daily and Zyrtec  10 mg daily  4. GERD LPR - diet and lifestyle changes to minimize reflux and trial of reflux gourmet  Will request records from the [place] and [facility] including operative reports if available and results of his sleep study if available, reportedly done at the [place] [facility] after testing  Update 09/27/23  Assessment and Plan    R Vocal Cord Paralysis R vocal cord paralysis secondary to paraganglioma surgery 17 years ago. Previous right-sided possibly bilateral medialization thyroplasty procedure based on CT neck results. We were unable to obtain records from [facility]. Today's videostrobe shows near complete glottic closure with secretion pooling in pyriforms R > L. MBS/esophagram with penetration and evidence of pharyngeal dysphagia but no aspiration, he reports improvement in voice and swallowing with therapy. Discussed revision surgery and filler injection. Revision surgery is complex due to previous changes and will not resolve swallowing issues. Filler injection could temporarily improve closure but requires repetition every 6-12 months. - Continue swallowing therapy exercises - Schedule follow-up in six months - Consider filler injection if symptoms  vs revision medialization thyroplasty  Dysphagia Dysphagia due to mostly pharyngeal weakness based on MBS results, likely related to previous paraganglioma surgery. Improvement with swallow therapy and esophageal dilation performed a couple of months ago. We discussed that revision surgery will not fix swallowing issues. - Continue swallowing therapy exercises - Maintain good oral hygiene to prevent aspiration pneumonia - Monitor for signs of aspiration pneumonia and return if symptoms occur - Continue glycopyrrolate  as prescribed to reduce oral secretions  General Health Maintenance Engages in regular physical activity (golf three times a week). Uses Zyrtec  and Flonase  for nasal drip management. - Encourage continued  physical activity - Continue using Zyrtec  and Flonase  for nasal drip management  Follow-up - Schedule follow-up appointment in six months.        Elena Larry, MD Otolaryngology [facility] Phone: [phone] Fax: [phone]    09/27/2023, 9:21 PM

## 2023-09-27 NOTE — Patient Instructions (Addendum)
   Keep up the voice and swallow exercises 5/7 days of the week to maintain your voice and swallowing  When you hear saliva in your mouth or throat, swallow to help your be more intelligible  Keep speaking and swallowing with intent - be deliberate and purposeful with your speech and swallowing - focus and concentrate   Have fun at [facility]!! Tell Larry I said Hi!

## 2023-09-27 NOTE — Therapy (Signed)
 OUTPATIENT SPEECH LANGUAGE PATHOLOGY VOICE THERAPY AND DISCHARGE SUMMARY   Patient Name: Chris Welch MRN: [id number] DOB:02/28/1946, 78 y.o., male Today's Date: 09/27/2023  PCP: Chris Harlene BROCKS, MD REFERRING PROVIDER: Lang Robbi CROME, MD  END OF SESSION:  End of Session - 09/27/23 1321     Visit Number 7    Number of Visits 17    Date for SLP Re-Evaluation 10/04/23    [facility]ization Type [facility] - [facility] visits through 11/03/23    Authorization - Visit Number 7    Authorization - Number of Visits 15    SLP Start Time 1315    SLP Stop Time  1400    SLP Time Calculation (min) 45 min    Activity Tolerance Patient tolerated treatment well              Past Medical History:  Diagnosis Date   Adenomatous colon polyp 2009   Anemia    Anxiety    Aortic atherosclerosis (HCC)    Arthritis    hips, knees (01/27/2016)   Complication of anesthesia    bad dreams-OCC PASSES OUT AFTER ANETHESIA   Depression    Diverticulitis    Diverticulosis    Ear infection ear infection lasting past 2 months, still ongoing, pt on antibiotics & drops. 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   Right Cervical Paraganglioma   Vocal cord paralysis    Right   Past Surgical History:  Procedure Laterality Date   APPENDECTOMY     BACK SURGERY     LOWER   CAROTID BODY TUMOR EXCISION     CERVICAL PARAGANGLIOMA EXCISION Right 12/2006   CHOLECYSTECTOMY OPEN     CYST EXCISION Left 12/17/2019    Procedure: CYST EXCISION; EXCISION NAIL HORN REMNANT LEFT RING FINGER;  Surgeon: Chris Kuba, MD;  Location: [facility];  Service: Orthopedics;  Laterality: Left;  IV REGIONAL   CYSTOSCOPY WITH INSERTION OF UROLIFT N/A 10/12/2020   Procedure: CYSTOSCOPY WITH INSERTION OF UROLIFT;  Surgeon: Chris Knee, MD;  Location: [facility];  Service: Urology;  Laterality: N/A;   DEEP NECK LYMPH NODE BIOPSY / EXCISION     DIGIT NAIL REMOVAL Left 07/30/2019   Procedure: REMOVAL OF NAIL HORNS LEFT RING FINGER;  Surgeon: Chris Kuba, MD;  Location: [facility];  Service: Orthopedics;  Laterality: Left;  IV REGIONAL FOREARM BLOCK   ESOPHAGOGASTRODUODENOSCOPY (EGD) WITH ESOPHAGEAL DILATION     INGUINAL HERNIA REPAIR Right 1954   INGUINAL HERNIA REPAIR Left 10/15/2021   Procedure: OPEN LEFT INGUINAL HERNIA REPAIR;  Surgeon: Chris Leonor CROME, MD;  Location: [facility];  Service: General;  Laterality: Left;   INSERTION OF MESH N/A 10/15/2021   Procedure: INSERTION OF MESH;  Surgeon: Chris Leonor CROME, MD;  Location: [facility] OR;  Service: General;  Laterality: N/A;   JOINT  REPLACEMENT     Welch ARTHROSCOPY Right    LUMBAR LAMINECTOMY/DECOMPRESSION MICRODISCECTOMY Left 03/09/2013   Procedure: LUMBAR LAMINECTOMY/DECOMPRESSION MICRODISCECTOMY 1 LEVEL;  Surgeon: Chris GORMAN Molt, MD;  Location: [facility];  Service: Neurosurgery;  Laterality: Left;  Left lumbar three-four extra-foraminal microdiscectomy   PATELLA ARTHROPLASTY Right    has had 5 surgeries right Welch   TOTAL HIP ARTHROPLASTY Left 01/27/2016   TOTAL HIP ARTHROPLASTY Left 01/27/2016   Procedure: TOTAL HIP ARTHROPLASTY ANTERIOR APPROACH;  Surgeon: Chris Sensor, MD;  Location: [facility];  Service: Orthopedics;  Laterality: Left;   UPPER GASTROINTESTINAL ENDOSCOPY     VOCAL CORD IMPLANT     Patient Active Problem List   Diagnosis Date Noted   Degenerative joint disease (DJD) of hip 01/05/2016   Atypical chest pain 04/22/2014   Gastric reflux with  aspiration 03/04/2014   Carpal tunnel syndrome 11/26/2013   HTN (hypertension) 11/26/2013   AR (allergic rhinitis) 11/26/2013   Low sodium levels 03/13/2013   BPH associated with nocturia 08/21/2012   Hyperlipidemia 08/21/2012   Recurrent aspiration bronchitis/pneumonia 10/05/2011   Diverticulosis 10/05/2011   Hearing loss of both ears 10/05/2011   Depression 10/05/2011   Vocal cord paralysis 01/17/2007    Onset date: 06/08/2023 (referral date)  REFERRING DIAG: J38.00 (ICD-10-CM) - Vocal cord paralysis  THERAPY DIAG:  Other voice and resonance disorders  Dysphagia, oropharyngeal phase  Rationale for Evaluation and Treatment: Rehabilitation  SUBJECTIVE:   SUBJECTIVE STATEMENT: My son said I'm speaking much better Pt accompanied by: self  PERTINENT HISTORY: Author Chris Welch is referred to ST by Dr. Robbi Cadet[facility] Dr. Jacob is following him for hearing loss and chronic otitis externa. [name] had Right carotid tumor rescetion in 2008, resulting in right vocal fold paralysis. At that time, he had vocal fold augmentation by Dr. Brien at [facility] as well as swallowing therapy.  He reports no laryngoscopy since 2008. [name] reported that Dr. Brien informed him he may need a revision in the future. [name] reports inconsistent voice quality, variable hoarseness and difficulty being heard in noisy situations. He reports he occasionally has food get stuck in right side of his pharynx and he coughs and brings it back up. He did have EDG recently with stretching which improved his swallowing. Denies difficulty with pills, recurrent respiratory illness or pna and no weight loss. [name] endorses getting choked on nasal drip and pooling of saliva in his throat as well as drool. He is numb on right side of his tongue and neck.  PAIN:  Are you having pain? No  FALLS: Has patient fallen in last 6 months? No,  LIVING ENVIRONMENT: Lives with: lives with their spouse Lives in:  House/apartment  PLOF:Level of assistance: Independent with ADLs, Independent with IADLs Employment: Retired  PATIENT GOALS: to be heard  OBJECTIVE:  Note: Objective measures were completed at Evaluation unless otherwise noted.  DIAGNOSTIC: MBSS (07/24/23) Clinical Impression: Pt presents with a pharyngeal more than oral dysphagia that he reports to be chronic in nature. He has R buccal pocketing of barium and saliva but lingual transport is relatively swift. He has minimal pharyngeal squeeze and almost no epiglottic inversion. A prominent CP impacts bolus clearance into the UES as well, and he has moderate amounts of residue. This fills his pyriform sinuses but is also noted along his R lateral channel. He has fairly consistent, trace penetration with thin liquids that reaches his vocal folds. It was never observed to fall below them, but he  almost never cleared them spontaneously (PAS 5). A cued throat clear was consistently effective at clearing his airway, but he said that he's been trying not to clear his throat from a vocal hygiene standpoint. A head turn to the R and a breath hold were both ineffective at increasing airway protection, but a L head tilt resulted in improved pharyngeal clearance and more shallow penetration with thin liquids. He used liquid washes to reduce pharyngeal residue from solids. Given that pt denies any acute changes and has no recent h/o PNA, he is not interested in any diet modifications at this time. Education was provided about continuing to stay active and keeping up with oral hygiene. We did discuss trying alternative strategies, such as a L head tilt, instead of throat clearing, but overall he is hesitant to make any changes because he feels like what he has been doing has been effective and it is easy for him to remember.   TODAY'S TREATMENT:                                                                                                                                           09/27/23: Marcey reports his wife has noted  improved intelligibility and that his family doesn't have any trouble understanding him. He is successful in a small group of 4-6 as long as there is no background noise due to [facility]. Marcey reports consistently speaking with intent and volume.He notes improved voice quality, although hoarseness persists due to paralyzed vocal fold.  He continues HEP for voice and dysphagia with mod I, 2-3x a day. Today he demonstrated both HEPs with mod I. He is following swallow precautions and swallowing with intent, eliminating distractions. VRQL improved by 5 points. Marcey maintained 72-75dB over 25 minute conversation with mod I. He is making phone calls with success. D/C ST at this time, Marcey is in agreement. He will continue HEPs 5/7 days a week for maintenance.  08/30/23: Garnette reports consistently speaking loud (with intent)  and reports improved success communicating on the golf course at a distance . He reports 50% reduction in the need to repeat himself overall (subjectively). Edwar reports improved success being understood over the phone as well as he is speaking with intent and volume. He continues to report difficulty being heard in noisy places. He completed HEP with mod I and is completing this 1-2x a day. In conversation, Marcey maintained 70-74dB with supervision cues.   08/23/23: Garnette enters verbalizing frustration with not being heard - reporting frustration and depression when he is not heard by his family and at appointments. This is exacerbated by his hearing loss, resulting in significant difficulty communicating. Targeted speaking with intent, using powerful voice. This does reduce or eliminate hoarse voice. He required occasional modeling and verbal cues to complete HEP accurately (PhoRTE) as he was doing sustained vowel 2-3x rather than 10x and was  not completing pitch glides. During conversation, Marcey maintained intentional speech with  rare min A for the duration of the session, to maintain 70-74dB and minimize hoarse voice. Today we generated strategies to improve communication including using a visual or verbal cue to let others know he is going to talk, such as using a hand gesture, name, or saying excuse me or ma'am when he is at a business or appointment to ensure the communication partner is attending to [place] before he speaks.  Self advocating by letting others know he has a hearing loss and that they need to face him and speak loudly at the beginning of an interaction in the community and reminding his family of this as well. And modifying environmental noises as he is able. This week he will continue to be deliberate about speaking with intent as well as try these strategies. He is using head tilt and independently reports he is eating slower and swallowing with intent as well.   07/31/23: Educated pt on MBSS results and recommendations (see above), including intermittent throat clearing for air protection (only during PO intake) and multiple swallows & liquid washes for residue clearance. Pt feels he is already independently compensating and modifying diet appropriately. Recommended pt continue trained swallow exercises from last session to maximize swallow function. Educated pt on 3 pillars of PNA, with endorsement of adequate oral care and good health status despite presence of oropharyngeal dysphagia. Answered all questions to pt satisfaction re: MBSS and recs. Overall pt presented with clear vocal quality ~80-85% of session with some intermittent hoarseness. Reported significant hearing impairment impacts auditory feedback and analysis of his vocal quality. Reviewed PhoRTE exercises, with usual min cues required to optimize vocal intensity for optimal quality. Throat clears x2 exhibited this session, with no drooling evidenced.   07/19/23: Marcey has seen Dr. Soldatavo - he is aware that he has MBSS on Monday. He is completing  HEP for voice with success. Initiated training in basic swallow exercises - will modify after MBSS. He completed effortful swallow masako and Mendelssohn with occasional min verbal cues and modeling. He is questioning ENT's dx of LPR - reviewed LPR education, provided handout. Reflux precaution verbalized with rare min A.   10/16/24BETHA Marcey cancelled his MBSS as he does not want to swallow barium. He doesn't feel like he has difficulty swallowing. He sees ENT next week, defer swallow study at this time. Marcey enters room with hoarse voice. He is completing HEP for voice, with success with clear voice some days, then persistent hoarseness other days. Today, he completed PhoRTE with rare min A, achieving clear phonation with focus on volume/intent. In structured speech tasks, he maintained clear phonation 20/20 sentences and during 8 minute conversation with supervision cues he maintained volume of 72-74dB and clear phonation. Marcey continues to require verbal and visual cues to ID throat clears. Harsh throat clearing 10+ times this session. Ongoing training in throat clear alternatives and suppression. He was able to avoid throat clearing 3x after review of throat clear alternatives. He endorses clearing his throat to clear his hoarse voice. Extensive education re: cycle of how throat clearing increases phlegm and how throat clearing contributes to vocal fold irritation and his hoarseness. He sees Dr. Soldatova next Wednesday. Will modify treatment/HEP as needed pending results of this visit. Cough suppression strategies next session.  06/21/23 (eval day): Initiated training to use breath support rather than vocal strain to be heard over noise or across a room. Initiated training in throat clear  alternatives and more frequent swallowing to reduce drool and coughing on perceived pooling of secretions in his pharynx. Reviewed basic swallow precautions, which he is following. Educated re: need to see a scientist, forensic  to assess status of vocal folds and prior vocal fold augmentation since he has not had laryngoscopy since 2008. Provided name  & number of Dr. Soldatova. Will request referral from Dr. Robbi Cedar. Initiated training in HEP, PhoRTE, for dysphonia. Will modify as needed pending laryngology consult.   PATIENT EDUCATION: Education details: See Today's Treatment, See Patient Instructions, vocal hygiene, swallow precautions, HEP for voice, Person educated: Patient Education method: Explanation, Demonstration, Verbal cues, and Handouts Education comprehension: verbalized understanding, returned demonstration, verbal cues required, and needs further education  HOME EXERCISE PROGRAM: PhoRTE, effortful swallow, masako, Mendelsohn, and Shaker's  GOALS: Goals reviewed with patient? Yes  SHORT TERM GOALS: Target date: 07/19/23  Pt will complete HEP for voice with rare min A Baseline: Goal status: PARTIALLY MET  2.  Pt will report 25% reduction in requests for repetition subjectively over 1 week Baseline:  Goal status: MET  3.  Pt will follow swallow precautions/diet recommendations following MBSS with rare min A Baseline:  Goal status: MET  4.  Pt will demonstrate clear phonation 18/20 sentences Baseline:  Goal status: MET  5.  Pt will demonstrate no vocal/neck strain when projecting volume in noisy environment or at a distance with occasional min A Baseline:  Goal status: MET  6.  Pt will swallow frequently to eliminate throat clears and reduce drool by 50% with occasional min A Baseline:  Goal status: MET  LONG TERM GOALS: Target date: 10/03/22  Pt will complete HEP for voice with mod I Baseline:  Goal status: MET  2.  Pt will report 50% reduction in need to repeat himself over 1 week Baseline:  Goal status: MET  3.  Pt will maintain clear phonation over 15 minute conversation with mod I Baseline:  Goal status: MET  4.  Pt will be intelligible in noisy environment/while  walking over 15 minute conversation with rare min A Baseline:  Goal status: MET  5.  Pt will improve score on VRQOL by 4 points  Baseline:  Goal status: MET  ASSESSMENT:  CLINICAL IMPRESSION: Patient is a 78 y.o. male who was seen today for dysphonia, R vocal fold paralysis after carotid tumor resection, s/p augmentation in 2008 & dysphagia. Elspeth has made  excellent progress on carrying over compensations for dysphonia including increasing volume, getting partner's attention before speaking and self advocating due to [facility]. He is completing HEP for voice and swallowing with mod I consistently. He reports consistent success communicating with family, friends, in community and over the phone. He reports reduced coughing with meals and is speaking and swallowing with intent and following swallow precautions. Goals met, d/c ST. Pt is in agreement.    OBJECTIVE IMPAIRMENTS: include voice disorder and dysphagia. These impairments are limiting patient from effectively communicating at home and in community and safety when swallowing. Factors affecting potential to achieve goals and functional outcome are previous level of function.. Patient will benefit from skilled SLP services to address above impairments and improve overall function.  REHAB POTENTIAL: Good  PLAN:  SLP FREQUENCY: 1-2x/week  SLP DURATION: 8 weeks  PLANNED INTERVENTIONS: Aspiration precaution training, Pharyngeal strengthening exercises, Diet toleration management , Environmental controls, Trials of upgraded texture/liquids, Cueing hierachy, Internal/external aids, Multimodal communication approach, SLP instruction and feedback, Compensatory strategies, and Patient/family education, MBSS vs FEES  SPEECH  THERAPY DISCHARGE SUMMARY  Visits from Start of Care: 7  Current functional level related to goals / functional outcomes: See goals above   Remaining deficits: Dysphonia & dysphagia due to vocal fold paralysis    Education / Equipment: HEP for voice and dysphagia; compensations for dysphonia, dysphagia and HOH   Patient agrees to discharge. Patient goals were met. Patient is being discharged due to meeting the stated rehab goals..     Austyn Seier Ann, CCC-SLP 09/27/2023, 2:20 PM

## 2023-10-04 ENCOUNTER — Encounter: Payer: No Typology Code available for payment source | Admitting: Speech Pathology

## 2023-10-11 ENCOUNTER — Telehealth: Payer: Self-pay | Admitting: Otolaryngology

## 2023-10-11 NOTE — Telephone Encounter (Signed)
I spoke with the [facility] rep and another lady in community care (Tiffany and Stephine)that works with their referrals. HE IS NOT COVERED BY THE [facility] for ENT at the moment. The [facility] facility should have sent a request to the [place] to send to ENT, and then if approved, the [place] would send a referral to ent. The [facility] is contacting [facility] b/c they should have sent a request form to them to even go to ENT, then the pt will have to do a f/u with the [place] provider then they will send a referral and they can not guarantee it will get backdated.  The [facility] was also called on the consult day of with Dr Irene Pap

## 2023-10-19 ENCOUNTER — Telehealth: Payer: Medicare HMO | Admitting: Physician Assistant

## 2023-10-19 ENCOUNTER — Other Ambulatory Visit (HOSPITAL_BASED_OUTPATIENT_CLINIC_OR_DEPARTMENT_OTHER): Payer: Self-pay

## 2023-10-19 DIAGNOSIS — J208 Acute bronchitis due to other specified organisms: Secondary | ICD-10-CM | POA: Diagnosis not present

## 2023-10-19 DIAGNOSIS — B9689 Other specified bacterial agents as the cause of diseases classified elsewhere: Secondary | ICD-10-CM

## 2023-10-19 MED ORDER — DOXYCYCLINE HYCLATE 100 MG PO TABS
100.0000 mg | ORAL_TABLET | Freq: Two times a day (BID) | ORAL | 0 refills | Status: DC
  Filled 2023-10-19: qty 14, 7d supply, fill #0

## 2023-10-19 MED ORDER — BENZONATATE 100 MG PO CAPS
100.0000 mg | ORAL_CAPSULE | Freq: Two times a day (BID) | ORAL | 0 refills | Status: DC | PRN
  Filled 2023-10-19: qty 20, 10d supply, fill #0

## 2023-10-19 NOTE — Progress Notes (Signed)
E-Visit for Cough   We are sorry that you are not feeling well.  Here is how we plan to help!  Based on your presentation I believe you most likely have A cough due to bacteria.  When patients have a productive cough with a change in color or increased sputum production, we are concerned about bacterial bronchitis.  If left untreated it can progress to pneumonia.  If your symptoms do not improve with your treatment plan it is important that you contact your provider.   I have prescribed Doxycycline 100 mg twice a day for 7 days     In addition you may use A prescription cough medication called Tessalon Perles 100mg . You may take 1-2 capsules every 8 hours as needed for your cough.  From your responses in the eVisit questionnaire you describe inflammation in the upper respiratory tract which is causing a significant cough.  This is commonly called Bronchitis and has four common causes:   Allergies Viral Infections Acid Reflux Bacterial Infection Allergies, viruses and acid reflux are treated by controlling symptoms or eliminating the cause. An example might be a cough caused by taking certain blood pressure medications. You stop the cough by changing the medication. Another example might be a cough caused by acid reflux. Controlling the reflux helps control the cough.  USE OF BRONCHODILATOR ("RESCUE") INHALERS: There is a risk from using your bronchodilator too frequently.  The risk is that over-reliance on a medication which only relaxes the muscles surrounding the breathing tubes can reduce the effectiveness of medications prescribed to reduce swelling and congestion of the tubes themselves.  Although you feel brief relief from the bronchodilator inhaler, your asthma may actually be worsening with the tubes becoming more swollen and filled with mucus.  This can delay other crucial treatments, such as oral steroid medications. If you need to use a bronchodilator inhaler daily, several times per day,  you should discuss this with your provider.  There are probably better treatments that could be used to keep your asthma under control.     HOME CARE Only take medications as instructed by your medical team. Complete the entire course of an antibiotic. Drink plenty of fluids and get plenty of rest. Avoid close contacts especially the very young and the elderly Cover your mouth if you cough or cough into your sleeve. Always remember to wash your hands A steam or ultrasonic humidifier can help congestion.   GET HELP RIGHT AWAY IF: You develop worsening fever. You become short of breath You cough up blood. Your symptoms persist after you have completed your treatment plan MAKE SURE YOU  Understand these instructions. Will watch your condition. Will get help right away if you are not doing well or get worse.    Thank you for choosing an e-visit.  Your e-visit answers were reviewed by a board certified advanced clinical practitioner to complete your personal care plan. Depending upon the condition, your plan could have included both over the counter or prescription medications.  Please review your pharmacy choice. Make sure the pharmacy is open so you can pick up prescription now. If there is a problem, you may contact your provider through [employer] and have the prescription routed to another pharmacy.  Your safety is important to [place]. If you have drug allergies check your prescription carefully.   For the next 24 hours you can use MyChart to ask questions about today's visit, request a non-urgent call back, or ask for a work or  school excuse. You will get an email in the next two days asking about your experience. I hope that your e-visit has been valuable and will speed your recovery.

## 2023-10-19 NOTE — Progress Notes (Signed)
I have spent 5 minutes in review of e-visit questionnaire, review and updating patient chart, medical decision making and response to patient.   Piedad Climes, PA-C

## 2023-10-25 ENCOUNTER — Other Ambulatory Visit (HOSPITAL_BASED_OUTPATIENT_CLINIC_OR_DEPARTMENT_OTHER): Payer: Self-pay

## 2023-10-25 MED ORDER — AZITHROMYCIN 250 MG PO TABS
ORAL_TABLET | ORAL | 0 refills | Status: AC
  Filled 2023-10-25: qty 6, 5d supply, fill #0

## 2023-11-23 ENCOUNTER — Other Ambulatory Visit (HOSPITAL_BASED_OUTPATIENT_CLINIC_OR_DEPARTMENT_OTHER): Payer: Self-pay

## 2023-11-23 MED ORDER — CLINDAMYCIN HCL 300 MG PO CAPS
600.0000 mg | ORAL_CAPSULE | Freq: Every day | ORAL | 0 refills | Status: DC
  Filled 2023-11-23: qty 8, 4d supply, fill #0

## 2024-02-14 ENCOUNTER — Telehealth (INDEPENDENT_AMBULATORY_CARE_PROVIDER_SITE_OTHER): Payer: Self-pay | Admitting: Otolaryngology

## 2024-02-14 NOTE — Telephone Encounter (Signed)
 LVM to confirm appt & location [phone] afm

## 2024-02-15 ENCOUNTER — Ambulatory Visit (INDEPENDENT_AMBULATORY_CARE_PROVIDER_SITE_OTHER): Payer: No Typology Code available for payment source | Admitting: Otolaryngology

## 2024-02-15 ENCOUNTER — Encounter (INDEPENDENT_AMBULATORY_CARE_PROVIDER_SITE_OTHER): Payer: Self-pay | Admitting: Otolaryngology

## 2024-02-15 DIAGNOSIS — K219 Gastro-esophageal reflux disease without esophagitis: Secondary | ICD-10-CM

## 2024-02-15 DIAGNOSIS — R490 Dysphonia: Secondary | ICD-10-CM | POA: Diagnosis not present

## 2024-02-15 DIAGNOSIS — J3089 Other allergic rhinitis: Secondary | ICD-10-CM

## 2024-02-15 DIAGNOSIS — J3801 Paralysis of vocal cords and larynx, unilateral: Secondary | ICD-10-CM

## 2024-02-15 DIAGNOSIS — J383 Other diseases of vocal cords: Secondary | ICD-10-CM | POA: Diagnosis not present

## 2024-02-15 DIAGNOSIS — R1313 Dysphagia, pharyngeal phase: Secondary | ICD-10-CM | POA: Diagnosis not present

## 2024-02-15 DIAGNOSIS — R0982 Postnasal drip: Secondary | ICD-10-CM

## 2024-02-15 DIAGNOSIS — R1310 Dysphagia, unspecified: Secondary | ICD-10-CM

## 2024-02-15 DIAGNOSIS — R0981 Nasal congestion: Secondary | ICD-10-CM

## 2024-02-15 NOTE — Progress Notes (Signed)
 ENT Progress Note:   Update 02/15/2024  Discussed the use of AI scribe software for clinical note transcription with the patient, who gave verbal consent to proceed.  History of Present Illness Chris Welch is a 78 year old male with hx of right sided paragangloma surgery and right vocal cord paralysis/pharyngeal dysphagia who presents for follow-up on swallowing and voice issues.  Swallowing is generally good, with the ability to eat all types of food without difficulty. No choking episodes are reported, but there is occasional coughing when saliva trickles down the throat. He has not experienced pneumonia or fever at home. Weight is stable.   Voice stability is noted, though there is variability, with some days being better than others. He has adapted to this fluctuation over time.  He uses Flonase  nasal spray intermittently, with the last use being two to three weeks ago. He does not take Zyrtec  or other allergy medications.  Swallowing therapy was completed in January, with no sessions since. A medialization thyroplasty procedure for right vocal cord paralysis was performed in 2008, involving a wedge placed behind the cord. He most likely also had palatoplasty for VPI at the same time.     Records Reviewed:  Initial Evaluation   Update 09/27/23 Discussed the use of AI scribe software for clinical note transcription with the patient, who gave verbal consent to proceed.  History of Present Illness   The patient is a 67 yoM, with a history of right cervical paraganglioma/carotid tumor removal 2008, hx of right vocal fold paralysis following the procedure, s/p medialization thyroplasty procedure in 2008, presents for a follow-up consultation. They deny any history of strokes. The patient's main issue based on MBS results is a lack of pharyngeal squeeze, leading to a lack of clearance of swallowed substances.   The patient has been participating in swallowing therapy and reports  improvement in swallowing and voice quality. They have been practicing swallowing hard and washing down excess saliva with water. They also report that they have not felt like they were going to choke on anything recently.  The patient has been taking medication to reduce saliva production (glyco), but reports that they often run out of this medication before they can get a refill. They note that when they take the medication regularly, they have less saliva.  The patient also reports that they had their esophagus dilated during a recent colonoscopy and endoscopy, and noticed an improvement in swallowing after this procedure. They have been staying active, playing golf three days a week, and maintaining good oral hygiene.    Initial Consult 07/12/23 Reason for Consult: dysphonia and hx of right vocal fold paralysis   HPI: Chris Welch is an 78 y.o. male with hx of OSA/with CPAP intolerance, right cervical paraganglioma/carotid tumor removal 2008, hx of right vocal fold paralysis following the procedure, s/p augmentation procedure in 2008, done at [facility], not sure what type of surgery he had at the time, but was told he might require a revision procedure in the future, here to discuss chronic dysphonia and decreased voice projection and hoarseness gradually worse over the course of the last couple of years. He is also c/o chronic dysphagia sx, with food getting stuck in his throat, particularly right side of his throat. He feels he has to cough and at times coughs up what he swallowed. Recent EGD with dilation done per record review with some improvement in his sx.  Per report paraganglioma surgery affected his tongue and right vocal  fold. He feels that the projection declined gradually. He has to cut up meat in small pieces. No dyspnea. No hx of stroke or heart attach. Mild coughing or choking with liquids or food at times. He reports post-nasal drip and pooling of saliva in the back of his  throat. In therapy for voice and swallowing. Records review with   Records Reviewed:  SLP note 06/21/23 Rosy Cooper" Vivian is referred to ST by Dr. Dor[facility] Gals[facility]. Jacob is following him for hearing loss and chronic otitis externa. Siegfried Dress had Right carotid tumor resection in 2008, resulting in right vocal fold paralysis. At that time, he had vocal fold augmentation by Dr. Brent Cambric at [facility]. He reports no laryngoscopy since 2008. Siegfried Dress reported that Dr. Brent Cambric informed him he may need a revision in the future. Siegfried Dress reports inconsistent voice quality, variable hoarseness and difficulty being heard in noisy situations. He reports he occasionally has food "get stuck" in right side of his pharynx and he coughs and brings it back up. He did have EDG recently with stretching which improved his swallowing. Denies difficulty with pills, recurrent respiratory illness or pna and no weight loss. Siegfried Dress endorses "getting choked" on "nasal drip" and pooling of saliva in his throat as well as drool. He is numb on right side of his tongue and neck.    Past Medical History:  Diagnosis Date   Adenomatous colon polyp 2009   Anemia    Anxiety    Aortic atherosclerosis (HCC)    Arthritis    "hips, knees" (01/27/2016)   Complication of anesthesia    bad dreams-OCC PASSES OUT AFTER ANETHESIA   Depression    Diverticulitis    Diverticulosis    Ear infection ear infection lasting past 2 months, still ongoing, pt on antibiotics & drops. pt states "ear infection has eaten holes through bilateral ear drums,  I am hard of hearing"   Enlarged prostate    Esophageal stricture    Gastropathy    reactive   GERD (gastroesophageal reflux disease)    Hard of hearing    History of carotid body tumor 2008   Hyperlipidemia    Hypertension    Malignant carotid body tumor (HCC)    2008   Paralysis of right vocal cord 2008   happened during his carotid tumor surgery   Pneumonia    x 3, last 2009 (01/27/2016)    Sleep apnea    CANNOT TOLERATE CPAP   Tumor of soft tissue of neck 09/2006   Right Cervical Paraganglioma   Vocal cord paralysis    Right    Past Surgical History:  Procedure Laterality Date   APPENDECTOMY     BACK SURGERY     LOWER   CAROTID BODY TUMOR EXCISION     CERVICAL PARAGANGLIOMA EXCISION Right 12/2006   CHOLECYSTECTOMY OPEN     CYST EXCISION Left 12/17/2019   Procedure: CYST EXCISION; EXCISION NAIL HORN REMNANT LEFT RING FINGER;  Surgeon: Lyanne Sample, MD;  Location: [facility];  Service: Orthopedics;  Laterality: Left;  IV REGIONAL   CYSTOSCOPY WITH INSERTION OF UROLIFT N/A 10/12/2020   Procedure: CYSTOSCOPY WITH INSERTION OF UROLIFT;  Surgeon: Dustin Gimenez, MD;  Location: [facility];  Service: Urology;  Laterality: N/A;   DEEP NECK LYMPH NODE BIOPSY / EXCISION     DIGIT NAIL REMOVAL Left 07/30/2019   Procedure: REMOVAL OF NAIL HORNS LEFT RING FINGER;  Surgeon: Lyanne Sample, MD;  Location: [facility];  Service: Orthopedics;  Laterality: Left;  IV REGIONAL FOREARM BLOCK   ESOPHAGOGASTRODUODENOSCOPY (EGD) WITH ESOPHAGEAL DILATION     INGUINAL HERNIA REPAIR Right 1954   INGUINAL HERNIA REPAIR Left 10/15/2021   Procedure: OPEN LEFT INGUINAL HERNIA REPAIR;  Surgeon: Lujean Sake, MD;  Location: [facility] OR;  Service: General;  Laterality: Left;   INSERTION OF MESH N/A 10/15/2021   Procedure: INSERTION OF MESH;  Surgeon: Lujean Sake, MD;  Location: [facility] OR;  Service: General;  Laterality: N/A;   JOINT REPLACEMENT     KNEE ARTHROSCOPY Right    LUMBAR LAMINECTOMY/DECOMPRESSION MICRODISCECTOMY Left 03/09/2013   Procedure: LUMBAR LAMINECTOMY/DECOMPRESSION MICRODISCECTOMY 1 LEVEL;  Surgeon: Isadora Mar, MD;  Location: [facility];  Service: Neurosurgery;  Laterality: Left;  Left lumbar three-four extra-foraminal microdiscectomy   PATELLA ARTHROPLASTY Right    has had 5 surgeries right knee   TOTAL HIP ARTHROPLASTY Left 01/27/2016   TOTAL HIP  ARTHROPLASTY Left 01/27/2016   Procedure: TOTAL HIP ARTHROPLASTY ANTERIOR APPROACH;  Surgeon: Wendolyn Hamburger, MD;  Location: [facility] OR;  Service: Orthopedics;  Laterality: Left;   UPPER GASTROINTESTINAL ENDOSCOPY     VOCAL CORD IMPLANT      Family History  Problem Relation Age of Onset   Arthritis Brother        back   Emphysema Brother    Cancer Brother        rare cancer-unknown type   Heart disease Mother    Emphysema Father        smoker   Colon cancer Neg Hx    Esophageal cancer Neg Hx    Rectal cancer Neg Hx    Stomach cancer Neg Hx     Social History:  reports that he has never smoked. He has never used smokeless tobacco. He reports current alcohol use of about 3.0 standard drinks of alcohol per week. He reports that he does not use drugs.  Allergies:  Allergies  Allergen Reactions   Sulfa Antibiotics Hives   Hydrocodone  Nausea And Vomiting    "can not take on an empty stomach"   Penicillins Other (See Comments) and Rash    Ulcers on eyes    Medications: I have reviewed the patient's current medications.  The PMH, PSH, Medications, Allergies, and SH were reviewed and updated.  ROS: Constitutional: Negative for fever, weight loss and weight gain. Cardiovascular: Negative for chest pain and dyspnea on exertion. Respiratory: Is not experiencing shortness of breath at rest. Gastrointestinal: Negative for nausea and vomiting. Neurological: Negative for headaches. Psychiatric: The patient is not nervous/anxious  There were no vitals taken for this visit.  PHYSICAL EXAM:  Exam: General: Well-developed, well-nourished Communication and Voice: wet quality and minimal dysarthria Respiratory Respiratory effort: Equal inspiration and expiration without stridor Cardiovascular Peripheral Vascular: Warm extremities with equal color/perfusion Eyes: No nystagmus with equal extraocular motion bilaterally Neuro/Psych/Balance: Patient oriented to person, place, and time;  Appropriate mood and affect; Gait is intact with no imbalance; Cranial nerves I-XII are intact, but right lateral tongue with mild atrophyfasciculations and weakness Head and Face Inspection: Normocephalic and atraumatic without mass or lesion Palpation: Facial skeleton intact without bony stepoffs Salivary Glands: No mass or tenderness Facial Strength: Facial motility symmetric and full bilaterally ENT Pinna: External ear intact and fully developed External canal: Canal is patent with intact skin Tympanic Membrane: Clear and mobile External Nose: No scar or anatomic deformity Internal Nose: Septum is deviated to the left. No polyp, or purulence. Mucosal edema and  erythema present.  Bilateral inferior turbinate hypertrophy.  Lips, Teeth, and gums: Mucosa and teeth intact and viable TMJ: No pain to palpation with full mobility Oral cavity/oropharynx: No erythema or exudate, no lesions present Nasopharynx: No mass or lesion with intact mucosa Hypopharynx: Intact mucosa with significant pooling of secretions R > L pyriforms - seen before and is stable from last exam Larynx Glottic: R VF paralysis small glottic gap present with adduction L VF atrophy Supraglottic: Normal appearing epiglottis and AE folds Interarytenoid Space: No or minimal pachydermia or edema Subglottic Space: Patent without lesion or edema Neck Neck and Trachea: Midline trachea without mass or lesion Thyroid: No mass or nodularity Lymphatics: No lymphadenopathy  Procedure: Preoperative diagnosis: hx of high vagal injury R VF paralysis s/p medialization thyroplasty pharyngeal dysphagia  Postoperative diagnosis:   Same  Procedure: Flexible fiberoptic laryngoscopy  Surgeon: Artice Last, MD  Anesthesia: Topical lidocaine  and Afrin Complications: None Condition is stable throughout exam  Indications and consent:  The patient presents to the clinic with above symptoms. Indirect laryngoscopy view was incomplete.  Thus it was recommended that they undergo a flexible fiberoptic laryngoscopy. All of the risks, benefits, and potential complications were reviewed with the patient preoperatively and verbal informed consent was obtained.  Procedure: The patient was seated upright in the clinic. Topical lidocaine  and Afrin were applied to the nasal cavity. After adequate anesthesia had occurred, I then proceeded to pass the flexible telescope into the nasal cavity. The nasal cavity was patent without rhinorrhea or polyp. The nasopharynx was also patent without mass or lesion. The base of tongue was visualized and was normal. The true vocal folds with R VF paralysis and normal movement on the left but evidence of VF bowing atrophy on the left. There was a small glottic gap as well and pooling of secretions in pyriform sinuses. There were no signs of glottic or supraglottic mucosal lesion or mass. There was moderate interarytenoid pachydermia and post cricoid edema. The telescope was then slowly withdrawn and the patient tolerated the procedure throughout.  Studies Reviewed:CT chest 06/05/20 Lungs/Pleura: Mild centrilobular and ground-glass nodularity within the RIGHT middle lobe; this is similar to 2011. RIGHT middle lobe atelectasis. RIGHT upper lobe centrilobular ground-glass. No pleural effusion or pneumothorax.   Upper Abdomen: Trace pneumobilia. Hepatic steatosis. No acute abnormality.   Musculoskeletal: No chest wall abnormality. No acute or significant osseous findings.   IMPRESSION: 1. There is a prominent lobulated azygos with a punctate focus of increased density which may reflect contrast versus calcification. This may correlate to outside reported radiograph finding. Outside radiograph is not available for review. 2. Mild centrilobular and ground-glass nodularity within the RIGHT middle lobe and RIGHT upper lobe, similar to 2011. Findings are most suggestive of an infectious or inflammatory  process.  CT neck     Assessment/Plan: Encounter Diagnoses  Name Primary?   Dysphonia Yes   Vocal fold paralysis, right    Age-related vocal fold atrophy    Post-nasal drip    Chronic nasal congestion    Glottic insufficiency    Pharyngeal dysphagia    Chronic GERD    Dysphagia, unspecified type    Environmental and seasonal allergies      78 year old male with history of right paraganglioma resection and right vocal fold and tongue weakness postop in 2008, reportedly underwent augmentation procedure for right vocal fold at [facility] back then, unfortunately records are not available for review at this time, also being seen by ENT at the [place], and  reportedly had diagnosis of OSA, but not able to tolerate CPAP currently.  He is here for multiple complaints including intermittent and gradually worsening dysphonia and poor projection as well as swallowing problems.  Had EGD and stretching done recently per record review.  In voice and swallow therapy currently.  He recalls that he was previously told that his vocal fold surgery will most likely require revision down the line to resize the implant.  Not sure what type of implant or surgery he had done at the time.  Feels that his right tongue is numb but overall movement is intact.   Chronic gradually worsening dysphonia in the setting of right vocal fold paralysis and tongue weakness following resection of right cervical paraganglioma in 2008 - we will request records from [place] (sleep study) and from [facility] about vocal fold surgery done in 2008 -Videostrobe today showed right vocal fold paralysis and incomplete closure with glottic insufficiency as well as vocal fold atrophy bilaterally but mucosal wave appeared to be symmetric and intact, there was no pooling of secretions in piriforms -We discussed that vocal fold implant surgery does require revision later on due to progressive vocal fold atrophy and the need for a larger implant over  time -Will order CT neck with contrast fine cuts to evaluate for presence of/ position of the right vocal fold implant and the size of the implant he likely had done in 2008 at [facility] -Continue voice and swallow therapy with speech  2.  Dysphagia oropharyngeal versus esophageal unclear at this time, mostly to solid foods, no weight loss or pneumonia, eating regular diet currently, had recent EGD and dilation.  He previously canceled his swallow assessment because he does not like to swallow barium. - schedule swallow study - MBS and esophagram  3.  Chronic nasal congestion postnasal drainage -I did not see pooling of secretions on scope exam today, but he had evidence of mucosal edema and clear postnasal drainage.  Suspect environmental allergies.  -Trial of Flonase  2 puffs bilateral nares twice daily and Zyrtec  10 mg daily  4. GERD LPR - diet and lifestyle changes to minimize reflux and trial of reflux gourmet  Will request records from the [place] and [place] including operative reports if available and results of his sleep study if available, reportedly done at the [place] [facility] after testing  Update 09/27/23  Assessment and Plan    R Vocal Cord Paralysis R vocal cord paralysis secondary to paraganglioma surgery 17 years ago. Previous right-sided possibly bilateral medialization thyroplasty procedure based on CT neck results. We were unable to obtain records from [facility]. Today's videostrobe shows near complete glottic closure with secretion pooling in pyriforms R > L. MBS/esophagram with penetration and evidence of pharyngeal dysphagia but no aspiration, he reports improvement in voice and swallowing with therapy. Discussed revision surgery and filler injection. Revision surgery is complex due to previous changes and will not resolve swallowing issues. Filler injection could temporarily improve closure but requires repetition every 6-12 months. - Continue swallowing therapy exercises - Schedule  follow-up in six months - Consider filler injection if symptoms  vs revision medialization thyroplasty  Dysphagia Dysphagia due to mostly pharyngeal weakness based on MBS results, likely related to previous paraganglioma surgery. Improvement with swallow therapy and esophageal dilation performed a couple of months ago. We discussed that revision surgery will not fix swallowing issues. - Continue swallowing therapy exercises - Maintain good oral hygiene to prevent aspiration pneumonia - Monitor for signs of aspiration pneumonia and return  if symptoms occur - Continue glycopyrrolate  as prescribed to reduce oral secretions  General Health Maintenance Engages in regular physical activity (golf three times a week). Uses Zyrtec  and Flonase  for nasal drip management. - Encourage continued physical activity - Continue using Zyrtec  and Flonase  for nasal drip management  Follow-up - Schedule follow-up appointment in six months.     Update 02/15/2024 Assessment and Plan Assessment & Plan Chronic dysphagia and dyshonia - appears stable currently  Right vocal cord paralysis glottic insufficiency VPI and pharyngeal dysphagia  S/p paraganglioma surgery and subsequent high-vagal injury  No aspiration on swallow assessment. Discussed observation, filler injection we would consider on the left side, or left-side implant with thyroplasty. Explained benefits and risks. He opts for observation for now, and I think this is reasonable since his MBS did not show aspiration he denies fevers chills or signs of PNA.  - Schedule annual check-up or follow-up as needed. - Counseled on aspiration precautions and monitor for pneumonia signs.       Artice Last, MD Otolaryngology [facility] Phone: [phone] Fax: [phone]    02/15/2024, 10:14 AM

## 2024-02-22 ENCOUNTER — Other Ambulatory Visit (HOSPITAL_BASED_OUTPATIENT_CLINIC_OR_DEPARTMENT_OTHER): Payer: Self-pay

## 2024-02-22 MED ORDER — KETOCONAZOLE 2 % EX SHAM
1.0000 | MEDICATED_SHAMPOO | Freq: Every day | CUTANEOUS | 2 refills | Status: AC
  Filled 2024-02-22: qty 120, 30d supply, fill #0
  Filled 2024-07-11: qty 120, 30d supply, fill #1
  Filled 2024-08-19: qty 120, 30d supply, fill #2

## 2024-02-28 ENCOUNTER — Other Ambulatory Visit (HOSPITAL_BASED_OUTPATIENT_CLINIC_OR_DEPARTMENT_OTHER): Payer: Self-pay

## 2024-05-28 DIAGNOSIS — C44329 Squamous cell carcinoma of skin of other parts of face: Secondary | ICD-10-CM | POA: Diagnosis not present

## 2024-05-28 DIAGNOSIS — L570 Actinic keratosis: Secondary | ICD-10-CM | POA: Diagnosis not present

## 2024-05-28 DIAGNOSIS — D225 Melanocytic nevi of trunk: Secondary | ICD-10-CM | POA: Diagnosis not present

## 2024-05-28 DIAGNOSIS — L821 Other seborrheic keratosis: Secondary | ICD-10-CM | POA: Diagnosis not present

## 2024-05-28 DIAGNOSIS — D692 Other nonthrombocytopenic purpura: Secondary | ICD-10-CM | POA: Diagnosis not present

## 2024-05-28 DIAGNOSIS — Z85828 Personal history of other malignant neoplasm of skin: Secondary | ICD-10-CM | POA: Diagnosis not present

## 2024-05-28 DIAGNOSIS — D485 Neoplasm of uncertain behavior of skin: Secondary | ICD-10-CM | POA: Diagnosis not present

## 2024-06-03 ENCOUNTER — Encounter: Payer: Self-pay | Admitting: Family Medicine

## 2024-06-03 DIAGNOSIS — C4490 Unspecified malignant neoplasm of skin, unspecified: Secondary | ICD-10-CM | POA: Insufficient documentation

## 2024-06-07 DIAGNOSIS — Z008 Encounter for other general examination: Secondary | ICD-10-CM | POA: Diagnosis not present

## 2024-06-12 ENCOUNTER — Ambulatory Visit (INDEPENDENT_AMBULATORY_CARE_PROVIDER_SITE_OTHER): Payer: Medicare Other | Admitting: *Deleted

## 2024-06-12 ENCOUNTER — Telehealth: Payer: Self-pay | Admitting: *Deleted

## 2024-06-12 VITALS — Ht 75.0 in | Wt 222.0 lb

## 2024-06-12 DIAGNOSIS — Z0000 Encounter for general adult medical examination without abnormal findings: Secondary | ICD-10-CM

## 2024-06-12 NOTE — Patient Instructions (Addendum)
 [name] , Thank you for taking time out of your busy schedule to complete your Annual Wellness Visit with me. I enjoyed our conversation and look forward to speaking with you again next year. I, as well as your care team,  appreciate your ongoing commitment to your health goals. Please review the following plan we discussed and let me know if I can assist you in the future. Your Game plan/ To Do List   Referrals: If you haven't heard from the office you've been referred to, please reach out to them at the phone provided.   Follow up Visits: Next Medicare AWV with our clinical staff:  07/18/25 9am, telephone  Next Office Visit with your provider: 07/03/24 10:20am, Dr Watt  Clinician Recommendations:  Aim for 30 minutes of exercise or brisk walking, 6-8 glasses of water, and 5 servings of fruits and vegetables each day.   You will need to get the following vaccines at your local [facility] of [facility]: Flu      This is a list of the screening recommended for you and due dates:  Health Maintenance  Topic Date Due   Colon Cancer Screening  05/21/2023   Flu Shot  04/19/2024   COVID-19 Vaccine (5 - 2025-26 season) 05/20/2024   Medicare Annual Wellness Visit  06/05/2024   DTaP/Tdap/Td vaccine (4 - Td or Tdap) 05/02/2033   Pneumococcal Vaccine for age over 21  Completed   Hepatitis C Screening  Completed   Zoster (Shingles) Vaccine  Completed   HPV Vaccine  Aged Out   Meningitis B Vaccine  Aged Out   Please let me know if you do not receive your Advanced Directive Packet within 1 week. Once completed and notarized, you may return a copy to our office by either of the following.   Advanced directives: (Provided) Advance directive discussed with you today. I have provided a copy for you to complete at home and have notarized. Once this is complete, please bring a copy in to our office so we can scan it into your chart.  Advance Care Planning is important because it:  [x]  Makes sure you  receive the medical care that is consistent with your values, goals, and preferences  [x]  It provides guidance to your family and loved ones and reduces their decisional burden about whether or not they are making the right decisions based on your wishes.  Follow the link provided in your after visit summary or read over the paperwork we have mailed to you to help you started getting your Advance Directives in place. If you need assistance in completing these, please reach out to us  so that we can help you!  See attachments for Preventive Care and Fall Prevention Tips.

## 2024-06-12 NOTE — Telephone Encounter (Signed)
 Pt had AWV today.  Scored 11 on PHQ-9.  Currently on Wellbutrin  450mg  twice a day. Doesn't feel counseling would help.  Tries to stay busy so he doesn't think about the things that depress him. I have scheduled him a follow up with you for 07/03/24. He sees the [place] regularly.

## 2024-06-12 NOTE — Progress Notes (Signed)
 Subjective:   Chris Welch is a 78 y.o. who presents for a Medicare Wellness preventive visit.  As a reminder, Annual Wellness Visits don't include a physical exam, and some assessments may be limited, especially if this visit is performed virtually. We may recommend an in-person follow-up visit with your provider if needed.  Visit Complete: Virtual I connected with  Chris Welch on 06/12/24 by a audio enabled telemedicine application and verified that I am speaking with the correct person using two identifiers.  Patient Location: Home  Provider Location: Office/Clinic  I discussed the limitations of evaluation and management by telemedicine. The patient expressed understanding and agreed to proceed.  Vital Signs: Because this visit was a virtual/telehealth visit, some criteria may be missing or patient reported. Any vitals not documented were not able to be obtained and vitals that have been documented are patient reported.  VideoDeclined- This patient declined Librarian, academic. Therefore the visit was completed with audio only.  Persons Participating in Visit: Patient.  AWV Questionnaire: No: Patient Medicare AWV questionnaire was not completed prior to this visit.  Cardiac Risk Factors include: advanced age (>59men, >71 women);hypertension;male gender;dyslipidemia;Other (see comment), Risk factor comments: hx of skin cancer     Objective:    Today's Vitals   06/12/24 0902  Weight: 222 lb (100.7 kg)  Height: 6' 3 (1.905 m)   Body mass index is 27.75 kg/m.     06/12/2024    9:23 AM 06/21/2023   10:27 AM 06/06/2023    9:07 AM 05/31/2022    3:15 PM 10/12/2021   11:01 AM 02/16/2021    7:04 AM 10/12/2020    7:28 AM  Advanced Directives  Does Patient Have a Medical Advance Directive? No No No No No No No  Would patient like information on creating a medical advance directive? Yes (MAU/Ambulatory/Procedural Areas - Information given)  No - Patient declined No - Patient declined No - Patient declined No - Patient declined No - Patient declined No - Patient declined    Current Medications (verified) Outpatient Encounter Medications as of 06/12/2024  Medication Sig   albuterol  (VENTOLIN  HFA) 108 (90 Base) MCG/ACT inhaler Inhale 2 puffs into the lungs every 6 (six) hours as needed for wheezing or shortness of breath.   amLODipine  (NORVASC ) 5 MG tablet Take 1 tablet (5 mg total) by mouth daily.   aspirin  EC 81 MG tablet Take 1 tablet by mouth twice daily for 2 weeks and then once daily for 2 weeks (Patient taking differently: Take 81 mg by mouth daily.)   buPROPion  (WELLBUTRIN  XL) 150 MG 24 hr tablet Take 3 tablets (450 mg total) by mouth daily. (Patient taking differently: Take 450 mg by mouth in the morning and at bedtime.)   celecoxib  (CELEBREX ) 200 MG capsule Take 1 capsule by mouth twice a day   cetirizine  (ZYRTEC ) 10 MG tablet Take 1 tablet (10 mg total) by mouth daily.   ferrous sulfate 325 (65 FE) MG tablet Take 325 mg by mouth every Monday, Wednesday, and Friday.   finasteride  (PROSCAR ) 2.5 mg tablet Take 5 mg by mouth daily.   fluticasone  (FLONASE ) 50 MCG/ACT nasal spray Place 2 sprays into both nostrils 2 (two) times daily. (Patient taking differently: Place 2 sprays into both nostrils 2 (two) times daily as needed.)   glycopyrrolate  (ROBINUL ) 1 MG tablet Take 1 tablet (1 mg total) by mouth 3 (three) times daily. (Patient taking differently: Take 1 mg by mouth 2 (two) times  daily.)   ketoconazole  (NIZORAL ) 2 % shampoo Apply 1 small Application topically daily.   losartan  (COZAAR ) 100 MG tablet Take 100 mg by mouth every morning.   meclizine  (ANTIVERT ) 25 MG tablet Take 1 tablet (25 mg total) by mouth 3 (three) times daily as needed for dizziness.   omeprazole  (PRILOSEC) 20 MG capsule Take 20 mg by mouth every morning.   ondansetron  (ZOFRAN ) 4 MG tablet Take 1 tablet by mouth every six to eight hours as needed    pravastatin  (PRAVACHOL ) 20 MG tablet Take 1 tablet (20 mg total) by mouth daily.   triamcinolone  cream (KENALOG ) 0.5 % Apply 1 application topically 2 (two) times daily. Use as needed on rash spots   benzonatate  (TESSALON ) 100 MG capsule Take 1 capsule (100 mg total) by mouth 2 (two) times daily as needed for cough.   triamcinolone  cream (KENALOG ) 0.1 % Apply 1 a small amount to affected area twice a day   [DISCONTINUED] clindamycin  (CLEOCIN ) 300 MG capsule Take 2 capsules (600 mg total) by mouth 1 hour prior to dental appointment.   [DISCONTINUED] COVID-19 mRNA bivalent vaccine, Pfizer, injection Inject into the muscle.   [DISCONTINUED] doxycycline  (VIBRA -TABS) 100 MG tablet Take 1 tablet (100 mg total) by mouth 2 (two) times daily.   [DISCONTINUED] tamsulosin  (FLOMAX ) 0.4 MG CAPS capsule SMARTSIG:1 By Mouth   [DISCONTINUED] terbinafine  (LAMISIL ) 250 MG tablet Take 1 tablet (250 mg total) by mouth daily.   [DISCONTINUED] tiZANidine  (ZANAFLEX ) 2 MG tablet Take 1 tablet by mouth every 6 to 8 hours   No facility-administered encounter medications on file as of 06/12/2024.    Allergies (verified) Sulfa antibiotics, Hydrocodone , and Penicillins   History: Past Medical History:  Diagnosis Date   Adenomatous colon polyp 2009   Anemia    Anxiety    Aortic atherosclerosis    Arthritis    hips, knees (01/27/2016)   Complication of anesthesia    bad dreams-OCC PASSES OUT AFTER ANETHESIA   Depression    Diverticulitis    Diverticulosis    Ear infection ear infection lasting past 2 months, still ongoing, pt on antibiotics & drops. 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   Right Cervical Paraganglioma   Vocal cord paralysis    Right   Past Surgical History:  Procedure Laterality Date   APPENDECTOMY     BACK SURGERY     LOWER   CAROTID BODY TUMOR EXCISION     CERVICAL PARAGANGLIOMA EXCISION Right 12/2006   CHOLECYSTECTOMY OPEN     CYST EXCISION Left 12/17/2019   Procedure: CYST EXCISION; EXCISION NAIL HORN REMNANT LEFT RING FINGER;  Surgeon: Murrell Kuba, MD;  Location: [facility];  Service: Orthopedics;  Laterality: Left;  IV REGIONAL   CYSTOSCOPY WITH INSERTION OF UROLIFT N/A 10/12/2020   Procedure: CYSTOSCOPY WITH INSERTION OF UROLIFT;  Surgeon: Penne Knee, MD;  Location: [facility];  Service: Urology;  Laterality: N/A;   DEEP NECK LYMPH NODE BIOPSY / EXCISION     DIGIT NAIL REMOVAL Left 07/30/2019   Procedure: REMOVAL OF NAIL HORNS LEFT RING FINGER;  Surgeon: Murrell Kuba, MD;  Location: [facility];  Service: Orthopedics;  Laterality: Left;  IV REGIONAL FOREARM BLOCK   ESOPHAGOGASTRODUODENOSCOPY (EGD) WITH ESOPHAGEAL DILATION     INGUINAL HERNIA REPAIR Right 1954   INGUINAL HERNIA REPAIR Left 10/15/2021   Procedure: OPEN LEFT INGUINAL HERNIA REPAIR;  Surgeon: Dasie Leonor CROME, MD;  Location: [facility];  Service: General;  Laterality: Left;   INSERTION OF MESH N/A 10/15/2021   Procedure: INSERTION OF MESH;  Surgeon: Dasie Leonor CROME, MD;  Location: [facility] OR;  Service: General;  Laterality: N/A;   JOINT REPLACEMENT     KNEE ARTHROSCOPY Right    LUMBAR LAMINECTOMY/DECOMPRESSION MICRODISCECTOMY Left 03/09/2013   Procedure: LUMBAR LAMINECTOMY/DECOMPRESSION MICRODISCECTOMY 1 LEVEL;  Surgeon: Alm GORMAN Molt, MD;  Location: [facility];  Service: Neurosurgery;  Laterality: Left;  Left lumbar three-four extra-foraminal microdiscectomy   PATELLA ARTHROPLASTY  Right    has had 5 surgeries right knee   TOTAL HIP ARTHROPLASTY Left 01/27/2016   TOTAL HIP ARTHROPLASTY Left 01/27/2016   Procedure: TOTAL HIP ARTHROPLASTY ANTERIOR APPROACH;  Surgeon: Dempsey Sensor, MD;  Location: [facility];  Service: Orthopedics;  Laterality: Left;   UPPER GASTROINTESTINAL ENDOSCOPY     VOCAL CORD IMPLANT     Family History  Problem Relation Age of Onset   Arthritis Brother        back   Emphysema Brother    Cancer Brother        rare cancer-unknown type   Heart disease Mother    Emphysema Father        smoker   Colon cancer Neg Hx    Esophageal cancer Neg Hx    Rectal cancer Neg Hx    Stomach cancer Neg Hx    Social History   Socioeconomic History   Marital status: Married    Spouse name: Jori Rund   Number of children: 4   Years of education: 16   Highest education level: Not on file  Occupational History   Occupation: Machinist-retired 2015    Employer: [employer]    Comment: [employer]   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  Three children are grown.  Younger daughter will graduate from [employer] in 01/2013.  Both sons and oldest daughter are in [place]. Exercises regularly.   His wife retired 12/2015 and is enjoying spending time in the yard, and with their family.   Social Drivers of Health   Financial Resource Strain: Low Risk  (06/12/2024)   Overall Financial Resource Strain (CARDIA)    Difficulty of Paying Living Expenses: Not very hard  Food Insecurity: No Food Insecurity (06/12/2024)   Hunger Vital Sign    Worried About  Running  Out of Food in the Last Year: Never true    Ran Out of Food in the Last Year: Never true  Transportation Needs: No Transportation Needs (06/12/2024)   PRAPARE - Administrator, Civil Service (Medical): No    Lack of Transportation (Non-Medical): No  Physical Activity: Sufficiently Active (06/12/2024)   Exercise Vital Sign    Days of Exercise per Week: 3 days    Minutes of Exercise per Session: 60 min  Stress: Stress Concern Present (06/12/2024)   [employer] of Occupational Health - Occupational Stress Questionnaire    Feeling of Stress: To some extent  Social Connections: Moderately Integrated (06/12/2024)   Social Connection and Isolation Panel    Frequency of Communication with Friends and Family: More than three times a week    Frequency of Social Gatherings with Friends and Family: Three times a week    Attends Religious Services: Never    Active Member of Clubs or Organizations: Yes    Attends Engineer, structural: More than 4 times per year    Marital Status: Married    Tobacco Counseling Counseling given: Not Answered    Clinical Intake:  Pre-visit preparation completed: Yes  Pain : No/denies pain     BMI - recorded: 27.75 Nutritional Status: BMI 25 -29 Overweight Nutritional Risks: None Diabetes: No  Lab Results  Component Value Date   HGBA1C 5.5 05/29/2019   HGBA1C 5.5 03/01/2017     How often do you need to have someone help you when you read instructions, pamphlets, or other written materials from your doctor or pharmacy?: 1 - Never What is the last grade level you completed in school?: college  Interpreter Needed?: No  Information entered by :: Lolita Libra, CMA(AAMA)   Activities of Daily Living     06/12/2024    9:11 AM  In your present state of health, do you have any difficulty performing the following activities:  Hearing? 0  Vision? 0  Difficulty concentrating or making decisions? 0  Walking or climbing  stairs? 0  Dressing or bathing? 0  Doing errands, shopping? 0  Preparing Food and eating ? N  Using the Toilet? N  In the past six months, have you accidently leaked urine? N  Do you have problems with loss of bowel control? N  Managing your Medications? N  Managing your Finances? N  Housekeeping or managing your Housekeeping? N    Patient Care Team: Copland, Harlene BROCKS, MD as PCP - General (Family Medicine) Jeffrie Oneil BROCKS, MD as PCP - Cardiology (Cardiology) Jeffrie Oneil BROCKS, MD as Consulting Physician (Cardiology) [facility], [facility]  I have updated your Care Teams any recent Medical Services you may have received from other providers in the past year.     Assessment:   This is a routine wellness examination for Anatole.  Hearing/Vision screen Hearing Screening - Comments:: Wears hearing aids Vision Screening - Comments:: [facility]    Goals Addressed   None    Depression Screen     06/12/2024    9:16 AM 06/06/2023    9:09 AM 05/31/2022    3:13 PM 06/07/2021    1:17 PM 03/06/2020    8:34 AM 01/05/2016    1:22 PM 06/04/2015    4:44 PM  PHQ 2/9 Scores  PHQ - 2 Score 4 0 0 0 1 2 0  PHQ- 9 Score 11     16  Fall Risk     06/12/2024    9:10 AM 06/06/2023    9:06 AM 05/31/2022    3:15 PM 06/07/2021    1:17 PM 03/06/2020    8:34 AM  Fall Risk   Falls in the past year? 0 0 0 0 0  Number falls in past yr: 0 0 0 0 0  Injury with Fall? 0 0 0 0 0  Risk for fall due to : No Fall Risks No Fall Risks Impaired vision    Follow up Education provided Falls evaluation completed Falls prevention discussed   Education provided;Falls prevention discussed      Data saved with a previous flowsheet row definition    MEDICARE RISK AT HOME:  Medicare Risk at Home Any stairs in or around the home?: Yes If so, are there any without handrails?: Yes Home free of loose throw rugs in walkways, pet beds, electrical cords, etc?: No Adequate lighting in your  home to reduce risk of falls?: Yes Life alert?: No Use of a cane, walker or w/c?: No Grab bars in the bathroom?: No Shower chair or bench in shower?: Yes Elevated toilet seat or a handicapped toilet?: Yes  TIMED UP AND GO:  Was the test performed?  No,audio  Cognitive Function: 6CIT completed        06/12/2024    9:23 AM 06/06/2023    9:12 AM 05/31/2022    3:16 PM  6CIT Screen  What Year? 0 points 0 points 0 points  What month? 0 points 0 points 0 points  What time? 0 points 0 points 0 points  Count back from 20 0 points 0 points 0 points  Months in reverse 0 points 0 points 4 points  Repeat phrase 0 points 0 points 0 points  Total Score 0 points 0 points 4 points    Immunizations Immunization History  Administered Date(s) Administered    sv, Bivalent, Protein Subunit Rsvpref,pf (Abrysvo) 11/02/2022   Fluad Quad(high Dose 65+) 05/29/2019, 07/29/2020, 06/07/2021   INFLUENZA, HIGH DOSE SEASONAL PF 06/23/2017, 05/24/2018, 06/19/2018   Influenza Split 07/08/2011, 07/09/2012   Influenza,inj,Quad PF,6+ Mos 05/28/2013, 07/08/2014, 07/23/2015, 07/06/2016   Influenza-Unspecified 05/21/2019, 06/07/2021   PFIZER(Purple Top)SARS-COV-2 Vaccination 11/03/2019, 11/25/2019, 09/25/2020   Pfizer Covid-19 Vaccine Bivalent Booster 86yrs & up 06/07/2021   Pneumococcal Conjugate-13 10/21/2014, 08/09/2016   Pneumococcal Polysaccharide-23 04/17/2012, 08/09/2017   Tdap 09/20/2011, 04/17/2012, 05/03/2023   Zoster Recombinant(Shingrix) 09/25/2018, 03/11/2019   Zoster, Live 01/25/2011    Screening Tests Health Maintenance  Topic Date Due   Colonoscopy  05/21/2023   Influenza Vaccine  04/19/2024   COVID-19 Vaccine (5 - 2025-26 season) 05/20/2024   Medicare Annual Wellness (AWV)  06/05/2024   DTaP/Tdap/Td (4 - Td or Tdap) 05/02/2033   Pneumococcal Vaccine: 50+ Years  Completed   Hepatitis C Screening  Completed   Zoster Vaccines- Shingrix  Completed   HPV VACCINES  Aged Out   Meningococcal  B Vaccine  Aged Out    Health Maintenance Items Addressed: Will get flu vaccine at the [facility], undecided about COVID vaccine. Reports colonoscopy at the [facility] in 2024, will request report.  Additional Screening:  Vision Screening: Recommended annual ophthalmology exams for early detection of glaucoma and other disorders of the eye. Is the patient up to date with their annual eye exam?  Yes  Who is the provider or what is the name of the [facility] in which the patient [facility] [place]  Dental Screening: Recommended annual dental  exams for proper oral hygiene  Community Resource Referral / Chronic Care Management: CRR required this visit?  No   CCM required this visit?  No   Plan:    I have personally reviewed and noted the following in the patient's chart:   Medical and social history Use of alcohol, tobacco or illicit drugs  Current medications and supplements including opioid prescriptions. Patient is not currently taking opioid prescriptions. Functional ability and status Nutritional status Physical activity Advanced directives List of other physicians Hospitalizations, surgeries, and ER visits in previous 12 months Vitals Screenings to include cognitive, depression, and falls Referrals and appointments  In addition, I have reviewed and discussed with patient certain preventive protocols, quality metrics, and best practice recommendations. A written personalized care plan for preventive services as well as general preventive health recommendations were provided to patient.   Lolita Libra, CMA   06/12/2024   After Visit Summary: (MyChart) Due to this being a telephonic visit, the after visit summary with patients personalized plan was offered to patient via MyChart   Notes: see phone note

## 2024-06-28 NOTE — Progress Notes (Signed)
 [facility] [address], [address] [place], [place] [address] [phone]  Date:  07/03/2024   Name:  Chris Welch   DOB:  12-12-1945   MRN:  [id number]  PCP:  Watt Harlene BROCKS, MD    Chief Complaint: Follow-up (No concerns )   History of Present Illness:  Chris Welch is a 78 y.o. very pleasant male patient who presents with the following:  Patient seen today for a follow-up visit.  He also does get much of his care through the [facility].  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 - we will request records from [facility] (sleep study) and from [facility] about vocal fold surgery done in 2008 -Videostrobe today showed right vocal fold paralysis and incomplete closure with glottic insufficiency as well as vocal fold atrophy bilaterally but mucosal wave appeared to be symmetric and intact, there was no pooling of secretions in piriforms -We discussed that vocal fold implant surgery does require revision later on due to progressive vocal fold atrophy and the need for a larger implant over time -Will order CT neck with contrast fine cuts to evaluate for presence of/ position of the right vocal fold implant and the size of the implant he likely had done in 2008 at [facility] -Continue voice and swallow therapy with speech   - Flu shot- will be given at the [facility] -COVID booster Shingrix is complete, recommend 1 dose of RSV Colonoscopy 2024, he is on a 3-year follow-up  Discussed the use of  AI scribe software for clinical note transcription with the patient, who gave verbal consent to proceed.  History of Present Illness Chris Welch is a 78 year old male with vocal cord paralysis and hearing loss who presents for a follow-up visit.  He has vocal cord paralysis with one paralyzed vocal cord that is open, leading to frequent aspiration and coughing, particularly at night. No recent lung infections or pneumonia have occurred. He reports that surgery has been discussed by his ENT, who indicated that fixing the vocal cord may improve his voice.  He experiences significant hearing loss despite using hearing aids, struggling to hear in crowded environments and unable to hear the television. A cochlear implant was recommended, but he has not received approval and was instead provided with new hearing aids, which he finds insufficient, especially in noisy environments. He is frustrated by his inability to hear phone calls or conversations clearly.  He has a history of MGUS, which is currently stable, with monitoring intervals extended to every six months. He is due for a blood test in three months.  He is on multiple medications, including blood pressure and cholesterol medications, Glides, an indigestion medication, iron supplements, Wellbutrin , a prostate medication, and vitamin C, all managed through the [facility] pharmacy. He reports occasional issues with medication supply from the [facility].  He experiences chronic fatigue, sleeping only three hours per night with occasional 30-minute naps during the day. He has difficulty using a CPAP machine due to discomfort. Despite fatigue, he remains active, playing  golf and doing yard work regularly.   Patient Active Problem List   Diagnosis Date Noted   Skin cancer 06/03/2024   Degenerative joint disease (DJD) of hip 01/05/2016   Atypical chest pain 04/22/2014   Gastric reflux with aspiration 03/04/2014   Carpal tunnel syndrome 11/26/2013   HTN  (hypertension) 11/26/2013   AR (allergic rhinitis) 11/26/2013   Low sodium levels 03/13/2013   BPH associated with nocturia 08/21/2012   Hyperlipidemia 08/21/2012   Recurrent aspiration bronchitis/pneumonia 10/05/2011   Diverticulosis 10/05/2011   Hearing loss of both ears 10/05/2011   Depression 10/05/2011   Vocal cord paralysis 01/17/2007    Past Medical History:  Diagnosis Date   Adenomatous colon polyp 2009   Anemia    Anxiety    Aortic atherosclerosis    Arthritis    hips, knees (01/27/2016)   Complication of anesthesia    bad dreams-OCC PASSES OUT AFTER ANETHESIA   Depression    Diverticulitis    Diverticulosis    Ear infection ear infection lasting past 2 months, still ongoing, pt on antibiotics & drops. 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   Right Cervical Paraganglioma   Vocal cord paralysis    Right    Past Surgical History:  Procedure Laterality Date   APPENDECTOMY     BACK SURGERY     LOWER   CAROTID BODY TUMOR EXCISION     CERVICAL PARAGANGLIOMA EXCISION Right 12/2006   CHOLECYSTECTOMY OPEN     CYST EXCISION Left 12/17/2019   Procedure: CYST EXCISION; EXCISION NAIL HORN REMNANT LEFT RING FINGER;  Surgeon: Murrell Kuba, MD;  Location: [facility];  Service: Orthopedics;  Laterality: Left;  IV REGIONAL   CYSTOSCOPY WITH INSERTION OF UROLIFT N/A 10/12/2020   Procedure: CYSTOSCOPY WITH INSERTION OF UROLIFT;  Surgeon: Penne Knee, MD;   Location: [facility] ORS;  Service: Urology;  Laterality: N/A;   DEEP NECK LYMPH NODE BIOPSY / EXCISION     DIGIT NAIL REMOVAL Left 07/30/2019   Procedure: REMOVAL OF NAIL HORNS LEFT RING FINGER;  Surgeon: Murrell Kuba, MD;  Location: [facility];  Service: Orthopedics;  Laterality: Left;  IV REGIONAL FOREARM BLOCK   ESOPHAGOGASTRODUODENOSCOPY (EGD) WITH ESOPHAGEAL DILATION     INGUINAL HERNIA REPAIR Right 1954   INGUINAL HERNIA REPAIR Left 10/15/2021   Procedure: OPEN LEFT INGUINAL HERNIA REPAIR;  Surgeon: Dasie Leonor CROME, MD;  Location: [facility] OR;  Service: General;  Laterality: Left;   INSERTION OF MESH N/A 10/15/2021   Procedure: INSERTION OF MESH;  Surgeon: Dasie Leonor CROME, MD;  Location: [facility] OR;  Service: General;  Laterality: N/A;   JOINT REPLACEMENT     KNEE ARTHROSCOPY Right    LUMBAR LAMINECTOMY/DECOMPRESSION MICRODISCECTOMY Left 03/09/2013   Procedure: LUMBAR LAMINECTOMY/DECOMPRESSION MICRODISCECTOMY 1 LEVEL;  Surgeon: Alm GORMAN Molt, MD;  Location: [facility] NEURO  ORS;  Service: Neurosurgery;  Laterality: Left;  Left lumbar three-four extra-foraminal microdiscectomy   PATELLA ARTHROPLASTY Right    has had 5 surgeries right knee   TOTAL HIP ARTHROPLASTY Left 01/27/2016   TOTAL HIP ARTHROPLASTY Left 01/27/2016   Procedure: TOTAL HIP ARTHROPLASTY ANTERIOR APPROACH;  Surgeon: Dempsey Sensor, MD;  Location: [facility] OR;  Service: Orthopedics;  Laterality: Left;   UPPER GASTROINTESTINAL ENDOSCOPY     VOCAL CORD IMPLANT      Social History   Tobacco Use   Smoking status: Never   Smokeless tobacco: Never  Vaping Use   Vaping status: Never Used  Substance Use Topics   Alcohol use: Yes    Alcohol/week: 3.0 standard drinks of alcohol    Types: 3 Cans of beer per week    Comment: 2 drinks per month   Drug use: No    Family History  Problem Relation Age of Onset   Arthritis Brother        back   Emphysema Brother    Cancer Brother        rare cancer-unknown type   Heart disease Mother     Emphysema Father        smoker   Colon cancer Neg Hx    Esophageal cancer Neg Hx    Rectal cancer Neg Hx    Stomach cancer Neg Hx     Allergies  Allergen Reactions   Sulfa Antibiotics Hives   Hydrocodone  Nausea And Vomiting    can not take on an empty stomach   Penicillins Other (See Comments) and Rash    Ulcers on eyes    Medication list has been reviewed and updated.  Current Outpatient Medications on File Prior to Visit  Medication Sig Dispense Refill   albuterol  (VENTOLIN  HFA) 108 (90 Base) MCG/ACT inhaler Inhale 2 puffs into the lungs every 6 (six) hours as needed for wheezing or shortness of breath. 8 g 0   amLODipine  (NORVASC ) 5 MG tablet Take 1 tablet (5 mg total) by mouth daily. 90 tablet 3   aspirin  EC 81 MG tablet Take 1 tablet by mouth twice daily for 2 weeks and then once daily for 2 weeks (Patient taking differently: Take 81 mg by mouth daily.) 120 tablet 0   benzonatate  (TESSALON ) 100 MG capsule Take 1 capsule (100 mg total) by mouth 2 (two) times daily as needed for cough. 20 capsule 0   buPROPion  (WELLBUTRIN  XL) 150 MG 24 hr tablet Take 3 tablets (450 mg total) by mouth daily. (Patient taking differently: Take 450 mg by mouth in the morning and at bedtime.) 270 tablet 3   celecoxib  (CELEBREX ) 200 MG capsule Take 1 capsule by mouth twice a day 60 capsule 0   cetirizine  (ZYRTEC ) 10 MG tablet Take 1 tablet (10 mg total) by mouth daily. 30 tablet 11   ferrous sulfate 325 (65 FE) MG tablet Take 325 mg by mouth every Monday, Wednesday, and Friday.     finasteride  (PROSCAR ) 2.5 mg tablet Take 5 mg by mouth daily.     fluticasone  (FLONASE ) 50 MCG/ACT nasal spray Place 2 sprays into both nostrils 2 (two) times daily. (Patient taking differently: Place 2 sprays into both nostrils 2 (two) times daily as needed.) 16 g 6   glycopyrrolate  (ROBINUL ) 1 MG tablet Take 1 tablet (1 mg total) by mouth 3 (three) times daily. (Patient taking differently: Take 1 mg by mouth 2 (two) times  daily.) 270 tablet 3   ketoconazole  (NIZORAL ) 2 %  shampoo Apply 1 small Application topically daily. 120 mL 2   losartan  (COZAAR ) 100 MG tablet Take 100 mg by mouth every morning.     meclizine  (ANTIVERT ) 25 MG tablet Take 1 tablet (25 mg total) by mouth 3 (three) times daily as needed for dizziness. 30 tablet 0   omeprazole  (PRILOSEC) 20 MG capsule Take 20 mg by mouth every morning.     ondansetron  (ZOFRAN ) 4 MG tablet Take 1 tablet by mouth every six to eight hours as needed 30 tablet 0   pravastatin  (PRAVACHOL ) 20 MG tablet Take 1 tablet (20 mg total) by mouth daily. 90 tablet 3   triamcinolone  cream (KENALOG ) 0.1 % Apply 1 a small amount to affected area twice a day 454 g 0   triamcinolone  cream (KENALOG ) 0.5 % Apply 1 application topically 2 (two) times daily. Use as needed on rash spots 90 g 1   No current facility-administered medications on file prior to visit.    Review of Systems: As per HPI- otherwise negative.    Physical Examination: Vitals:   07/03/24 1001  BP: 136/88  Pulse: 80  SpO2: 91%   Vitals:   07/03/24 1001  Weight: 221 lb (100.2 kg)  Height: 6' 3 (1.905 m)   Body mass index is 27.62 kg/m. Ideal Body Weight: Weight in (lb) to have BMI = 25: 199.6  GEN: no acute distress. Mild overweight, looks well  HEENT: Atraumatic, Normocephalic.  Bilateral TM wnl, oropharynx normal.  PEERL,EOMI. he is hard of hearing but does okay when we are looking directly at each other with no background noise Ears and Nose: No external deformity. CV: RRR, No M/G/R. No JVD. No thrill. No extra heart sounds. PULM: CTA B, no wheezes, crackles, rhonchi. No retractions. No resp. distress. No accessory muscle use. ABD: S, NT, ND, +BS. No rebound. No HSM. EXTR: No c/c/e PSYCH: Normally interactive. Conversant.    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 No recent lung infections or pneumonia. - Consider surgical intervention to improve vocal cord function and reduce aspiration risk.  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 Cochlear implant recommended by ENT but not pursued [facility]. - Consider exploring cochlear implant options through private insurance.  Monoclonal gammopathy of undetermined significance (MGUS), stable MGUS stable with monitoring frequency reduced to every six months. - Conduct regular blood tests to monitor the condition.  Insomnia Chronic insomnia with significant fatigue. CPAP not tolerated. - Prescribe trazodone  50 mg, instruct to take 30 minutes before bedtime. Start with half a tablet and increase to a full tablet if needed. Hypertension managed with medication.   Hyperlipidemia managed with medication.  Gastroesophageal reflux disease (GERD) GERD managed with medication.  Depression Depression managed with Wellbutrin . Reports feeling tired but remains active. - Trazodone  prescribed for insomnia may also aid in mood improvement.  Iron deficiency on supplementation Iron deficiency managed with iron supplementation on a Monday, Wednesday, and Friday schedule.  Benign prostatic hyperplasia on finasteride  which he takes Monday Wednesday and Friday  General Health Maintenance - Colonoscopy due in two years.  Signed Harlene Schroeder, MD

## 2024-07-03 ENCOUNTER — Encounter: Payer: Self-pay | Admitting: Family Medicine

## 2024-07-03 ENCOUNTER — Ambulatory Visit: Admitting: Family Medicine

## 2024-07-03 ENCOUNTER — Other Ambulatory Visit (HOSPITAL_BASED_OUTPATIENT_CLINIC_OR_DEPARTMENT_OTHER): Payer: Self-pay

## 2024-07-03 VITALS — BP 136/88 | HR 80 | Ht 75.0 in | Wt 221.0 lb

## 2024-07-03 DIAGNOSIS — Z72820 Sleep deprivation: Secondary | ICD-10-CM

## 2024-07-03 MED ORDER — TRAZODONE HCL 50 MG PO TABS
25.0000 mg | ORAL_TABLET | Freq: Every evening | ORAL | 3 refills | Status: AC | PRN
  Filled 2024-07-03: qty 30, 30d supply, fill #0

## 2024-07-03 NOTE — Patient Instructions (Signed)
 It was good to see you - please be sure to get your flu shot and covid booster this fall We can try the trazodone  as needed for sleep- let me know how this works for you!

## 2024-07-11 ENCOUNTER — Other Ambulatory Visit (HOSPITAL_BASED_OUTPATIENT_CLINIC_OR_DEPARTMENT_OTHER): Payer: Self-pay

## 2024-07-11 DIAGNOSIS — C44329 Squamous cell carcinoma of skin of other parts of face: Secondary | ICD-10-CM | POA: Diagnosis not present

## 2024-07-11 MED ORDER — DOXYCYCLINE MONOHYDRATE 100 MG PO TABS
100.0000 mg | ORAL_TABLET | Freq: Two times a day (BID) | ORAL | 0 refills | Status: DC
  Filled 2024-07-11: qty 10, 5d supply, fill #0

## 2024-08-19 ENCOUNTER — Other Ambulatory Visit (HOSPITAL_BASED_OUTPATIENT_CLINIC_OR_DEPARTMENT_OTHER): Payer: Self-pay

## 2024-08-28 ENCOUNTER — Other Ambulatory Visit (HOSPITAL_BASED_OUTPATIENT_CLINIC_OR_DEPARTMENT_OTHER): Payer: Self-pay

## 2024-08-28 ENCOUNTER — Ambulatory Visit (HOSPITAL_COMMUNITY)
Admission: EM | Admit: 2024-08-28 | Discharge: 2024-08-28 | Disposition: A | Discharge status: Home / Self Care | Attending: Internal Medicine | Admitting: Internal Medicine

## 2024-08-28 ENCOUNTER — Telehealth: Admitting: Physician Assistant

## 2024-08-28 ENCOUNTER — Encounter (HOSPITAL_BASED_OUTPATIENT_CLINIC_OR_DEPARTMENT_OTHER): Payer: Self-pay | Admitting: Emergency Medicine

## 2024-08-28 ENCOUNTER — Encounter (HOSPITAL_COMMUNITY): Payer: Self-pay

## 2024-08-28 ENCOUNTER — Other Ambulatory Visit: Payer: Self-pay

## 2024-08-28 ENCOUNTER — Emergency Department (HOSPITAL_BASED_OUTPATIENT_CLINIC_OR_DEPARTMENT_OTHER)

## 2024-08-28 ENCOUNTER — Emergency Department (HOSPITAL_BASED_OUTPATIENT_CLINIC_OR_DEPARTMENT_OTHER)
Admission: EM | Admit: 2024-08-28 | Discharge: 2024-08-28 | Disposition: A | Discharge status: Home / Self Care | Attending: Emergency Medicine | Admitting: Emergency Medicine

## 2024-08-28 DIAGNOSIS — Z79899 Other long term (current) drug therapy: Secondary | ICD-10-CM | POA: Insufficient documentation

## 2024-08-28 DIAGNOSIS — I160 Hypertensive urgency: Secondary | ICD-10-CM

## 2024-08-28 DIAGNOSIS — J069 Acute upper respiratory infection, unspecified: Secondary | ICD-10-CM

## 2024-08-28 DIAGNOSIS — R0789 Other chest pain: Secondary | ICD-10-CM | POA: Insufficient documentation

## 2024-08-28 DIAGNOSIS — I10 Essential (primary) hypertension: Secondary | ICD-10-CM | POA: Diagnosis not present

## 2024-08-28 DIAGNOSIS — R079 Chest pain, unspecified: Secondary | ICD-10-CM

## 2024-08-28 DIAGNOSIS — Z7982 Long term (current) use of aspirin: Secondary | ICD-10-CM | POA: Diagnosis not present

## 2024-08-28 LAB — CBC WITH DIFFERENTIAL/PLATELET
Abs Immature Granulocytes: 0.04 K/uL (ref 0.00–0.07)
Basophils Absolute: 0.1 K/uL (ref 0.0–0.1)
Basophils Relative: 0 %
Eosinophils Absolute: 0.3 K/uL (ref 0.0–0.5)
Eosinophils Relative: 2 %
HCT: 39.3 % (ref 39.0–52.0)
Hemoglobin: 13.1 g/dL (ref 13.0–17.0)
Immature Granulocytes: 0 %
Lymphocytes Relative: 8 %
Lymphs Abs: 1 K/uL (ref 0.7–4.0)
MCH: 30 pg (ref 26.0–34.0)
MCHC: 33.3 g/dL (ref 30.0–36.0)
MCV: 89.9 fL (ref 80.0–100.0)
Monocytes Absolute: 0.9 K/uL (ref 0.1–1.0)
Monocytes Relative: 8 %
Neutro Abs: 9.3 K/uL — ABNORMAL HIGH (ref 1.7–7.7)
Neutrophils Relative %: 82 %
Platelets: 200 K/uL (ref 150–400)
RBC: 4.37 MIL/uL (ref 4.22–5.81)
RDW: 13.3 % (ref 11.5–15.5)
WBC: 11.5 K/uL — ABNORMAL HIGH (ref 4.0–10.5)
nRBC: 0 % (ref 0.0–0.2)

## 2024-08-28 LAB — COMPREHENSIVE METABOLIC PANEL WITH GFR
ALT: 16 U/L (ref 0–44)
AST: 17 U/L (ref 15–41)
Albumin: 4.1 g/dL (ref 3.5–5.0)
Alkaline Phosphatase: 92 U/L (ref 38–126)
Anion gap: 11 (ref 5–15)
BUN: 15 mg/dL (ref 8–23)
CO2: 25 mmol/L (ref 22–32)
Calcium: 9 mg/dL (ref 8.9–10.3)
Chloride: 95 mmol/L — ABNORMAL LOW (ref 98–111)
Creatinine, Ser: 1 mg/dL (ref 0.61–1.24)
GFR, Estimated: >60 mL/min (ref >60)
Glucose, Bld: 112 mg/dL — ABNORMAL HIGH (ref 70–99)
Potassium: 4.3 mmol/L (ref 3.5–5.1)
Sodium: 131 mmol/L — ABNORMAL LOW (ref 135–145)
Total Bilirubin: 0.9 mg/dL (ref 0.0–1.2)
Total Protein: 7.9 g/dL (ref 6.5–8.1)

## 2024-08-28 LAB — TROPONIN T, HIGH SENSITIVITY
Troponin T High Sensitivity: <15 ng/L (ref 0–19)
Troponin T High Sensitivity: <15 ng/L (ref 0–19)

## 2024-08-28 MED ORDER — FLUTICASONE PROPIONATE 50 MCG/ACT NA SUSP
2.0000 | Freq: Every day | NASAL | 0 refills | Status: AC
  Filled 2024-08-28: qty 16, 30d supply, fill #0

## 2024-08-28 MED ORDER — BENZONATATE 100 MG PO CAPS
100.0000 mg | ORAL_CAPSULE | Freq: Three times a day (TID) | ORAL | 0 refills | Status: AC | PRN
  Filled 2024-08-28: qty 30, 10d supply, fill #0

## 2024-08-28 NOTE — ED Provider Notes (Signed)
 [facility] Provider Note   CSN: [id number] Arrival date & time: 08/28/24  1954     Patient presents with: Chest Pain   Chris Welch is a 78 y.o. male.   Patient here after episode of burning chest pain this afternoon that is now self resolved.  This occurred after eating Jodie Edison for lunch.  He had similar episode Monday night after eating.  Burning type pain as well.  He takes reflux medicine twice a day.  He has not had any exertional symptoms.  Does not have any pain when he ambulates or walks or performs any activities.  Is not short of breath does not have any fevers or chills.  Symptoms now resolved.  Sent here by urgent care.  He has history of gastropathy esophageal stricture hypertension high cholesterol vocal cord issues when he had a carotid tumor removed.  Patient does not smoke.  The history is provided by the patient.       Prior to Admission medications   Medication Sig Start Date End Date Taking? Authorizing Provider  albuterol  (VENTOLIN  HFA) 108 (90 Base) MCG/ACT inhaler Inhale 2 puffs into the lungs every 6 (six) hours as needed for wheezing or shortness of breath. 02/15/21   Vivienne Delon HERO, PA-C  amLODipine  (NORVASC ) 5 MG tablet Take 1 tablet (5 mg total) by mouth daily. 06/07/21   Copland, Harlene BROCKS, MD  aspirin  EC 81 MG tablet Take 1 tablet by mouth twice daily for 2 weeks and then once daily for 2 weeks Patient taking differently: Take 81 mg by mouth daily. 06/09/21     benzonatate  (TESSALON ) 100 MG capsule Take 1 capsule (100 mg total) by mouth 3 (three) times daily as needed. 08/28/24   Vivienne Delon HERO, PA-C  buPROPion  (WELLBUTRIN  XL) 150 MG 24 hr tablet Take 3 tablets (450 mg total) by mouth daily. Patient taking differently: Take 450 mg by mouth in the morning and at bedtime. 07/05/16   Juliane Che, PA  celecoxib  (CELEBREX ) 200 MG capsule Take 1 capsule by mouth twice a day 06/09/21      cetirizine  (ZYRTEC ) 10 MG tablet Take 1 tablet (10 mg total) by mouth daily. 07/14/23   Soldatova, Liuba, MD  doxycycline  (ADOXA) 100 MG tablet Take 1 tablet (100 mg) by mouth twice a day with meals. 07/11/24     ferrous sulfate 325 (65 FE) MG tablet Take 325 mg by mouth every Monday, Wednesday, and Friday.    [provider]  finasteride  (PROSCAR ) 2.5 mg tablet Take 5 mg by mouth daily.    [provider]  fluticasone  (FLONASE ) 50 MCG/ACT nasal spray Place 2 sprays into both nostrils daily. 08/28/24   Vivienne Delon HERO, PA-C  glycopyrrolate  (ROBINUL ) 1 MG tablet Take 1 tablet (1 mg total) by mouth 3 (three) times daily. Patient taking differently: Take 1 mg by mouth 2 (two) times daily. 04/20/18   Copland, Harlene BROCKS, MD  ketoconazole  (NIZORAL ) 2 % shampoo Apply 1 small Application topically daily. 02/22/24     losartan  (COZAAR ) 100 MG tablet Take 100 mg by mouth every morning.    [provider]  meclizine  (ANTIVERT ) 25 MG tablet Take 1 tablet (25 mg total) by mouth 3 (three) times daily as needed for dizziness. 01/01/22   Jeannetta Channing CROME, NP  omeprazole  (PRILOSEC) 20 MG capsule Take 20 mg by mouth every morning.    [provider]  ondansetron  (ZOFRAN ) 4 MG tablet Take 1  tablet by mouth every six to eight hours as needed 06/09/21     pravastatin  (PRAVACHOL ) 20 MG tablet Take 1 tablet (20 mg total) by mouth daily. 06/09/15   Jeffrie Oneil BROCKS, MD  traZODone  (DESYREL ) 50 MG tablet Take 0.5-1 tablets (25-50 mg total) by mouth at bedtime as needed for sleep. 07/03/24   Copland, Jessica C, MD  triamcinolone  cream (KENALOG ) 0.1 % Apply 1 a small amount to affected area twice a day 08/19/21     triamcinolone  cream (KENALOG ) 0.5 % Apply 1 application topically 2 (two) times daily. Use as needed on rash spots 07/26/21   Copland, Harlene BROCKS, MD    Allergies: Sulfa antibiotics, Hydrocodone , and Penicillins    Review of Systems  Updated Vital Signs BP (!) 163/99   Pulse 80    Temp 97.8 F (36.6 C)   Resp 15   Ht 6' 3 (1.905 m)   Wt 102.1 kg   SpO2 93%   BMI 28.12 kg/m   Physical Exam Vitals and nursing note reviewed.  Constitutional:      General: He is not in acute distress.    Appearance: He is well-developed. He is not ill-appearing.  HENT:     Head: Normocephalic and atraumatic.  Eyes:     Extraocular Movements: Extraocular movements intact.     Conjunctiva/sclera: Conjunctivae normal.     Pupils: Pupils are equal, round, and reactive to light.  Cardiovascular:     Rate and Rhythm: Normal rate and regular rhythm.     Pulses:          Radial pulses are 2+ on the right side and 2+ on the left side.     Heart sounds: Normal heart sounds. No murmur heard. Pulmonary:     Effort: Pulmonary effort is normal. No respiratory distress.     Breath sounds: Normal breath sounds. No decreased breath sounds.  Abdominal:     Palpations: Abdomen is soft.     Tenderness: There is no abdominal tenderness.  Musculoskeletal:        General: No swelling.     Cervical back: Normal range of motion and neck supple.  Skin:    General: Skin is warm and dry.     Capillary Refill: Capillary refill takes less than 2 seconds.  Neurological:     General: No focal deficit present.     Mental Status: He is alert.  Psychiatric:        Mood and Affect: Mood normal.     (all labs ordered are listed, but only abnormal results are displayed) Labs Reviewed  CBC WITH DIFFERENTIAL/PLATELET - Abnormal; Notable for the following components:      Result Value   WBC 11.5 (*)    Neutro Abs 9.3 (*)    All other components within normal limits  COMPREHENSIVE METABOLIC PANEL WITH GFR - Abnormal; Notable for the following components:   Sodium 131 (*)    Chloride 95 (*)    Glucose, Bld 112 (*)    All other components within normal limits  TROPONIN T, HIGH SENSITIVITY  TROPONIN T, HIGH SENSITIVITY    EKG: None  Radiology: DG Chest 2 View Result Date: 08/28/2024 EXAM: 2  VIEW(S) XRAY OF THE CHEST 08/28/2024 08:22:00 PM COMPARISON: 02/16/2021 CLINICAL HISTORY: pain FINDINGS: LUNGS AND PLEURA: No focal pulmonary opacity. No pleural effusion. No pneumothorax. HEART AND MEDIASTINUM: No acute abnormality of the cardiac and mediastinal silhouettes. BONES AND SOFT TISSUES: Surgical clips in the right neck. No  acute osseous abnormality. IMPRESSION: 1. No acute cardiopulmonary process. Electronically signed by: Pinkie Pebbles MD 08/28/2024 08:25 PM EST RP Workstation: [id number]     Procedures   Medications Ordered in the ED - No data to display              HEART Score: 3                    Medical Decision Making Amount and/or Complexity of Data Reviewed Labs: ordered. Radiology: ordered.   Chris Welch is here with chest pain now resolved.  He had an episode of burning chest pain that started around 2:00 this afternoon after eating Jodie Edison.  History of reflux.  History of hypertension high cholesterol.  No cardiac history otherwise.  He is not having any exertional symptoms.  He had an episode of burning chest pain after going to bed a few nights ago after eating as well.  Sounds like he had jalapeno dip that night.  Overall he denies any chest pain shortness of breath weakness numbness tingling currently.  No recent surgery or travel.  He is feeling much better.  No abdominal pain.  No black or bloody stools.  Differential diagnosis likely reflux related process but will evaluate for ACS electrolyte abnormality pneumonia anemia.  EKG shows sinus rhythm.  No ischemic changes.  Heart score is 3.  Overall lab work shows no significant leukocytosis anemia or electrolyte abnormality.  Troponin negative x 2.  No evidence of pneumonia pneumothorax.  Overall he has been pain-free throughout my care.  Each episode of discomfort seems to be related to eating and reflux type pain.  Overall he has a heart score of 3.  I think is reasonable that he can follow-up with  cardiology outpatient.  He understands return if symptoms worsen especially if they occur with exertion.  Follow-up with primary care.  Discharge.  This chart was dictated using voice recognition software.  Despite best efforts to proofread,  errors can occur which can change the documentation meaning.       Final diagnoses:  Nonspecific chest pain    ED Discharge Orders          Ordered    Ambulatory referral to Cardiology       Comments: If you have not heard from the Cardiology office within the next 72 hours please call [phone].   08/28/24 2234               Ruthe Cornet, DO 08/28/24 2235

## 2024-08-28 NOTE — Discharge Instructions (Signed)
 Please return if pain worsens as we discussed.  Continue taking reflux medicine.  Follow-up with your primary care doctor and cardiology team.  If you develop worsening pain especially with exertion please return for reevaluation.

## 2024-08-28 NOTE — ED Triage Notes (Signed)
 Pt c/o diffuse burning and tightness in chest; had an episode of this on Mon, then another one today; denies other sxs

## 2024-08-28 NOTE — ED Notes (Signed)
 Patient is being discharged from the Urgent Care and sent to the Emergency Department via POV . Per Rosaline, NP, patient is in need of higher level of care due to HTN urgency. Patient is aware and verbalizes understanding of plan of care.  Vitals:   08/28/24 1815 08/28/24 1854  BP: (!) 116/106 (!) 188/107  Pulse: 86   Resp: 17   Temp: 97.9 F (36.6 C)   SpO2: 96%

## 2024-08-28 NOTE — ED Provider Notes (Signed)
 [facility]    CSN: [id number] Arrival date & time: 08/28/24  1757      History   Chief Complaint Chief Complaint  Patient presents with   Hypertension   Chest Pain    HPI Chris Welch is a 78 y.o. male.   Chris Welch is a 78 y.o. male presenting for chief complaint of Hypertension and Chest Pain.   Burning in the chest bilaterally started in the early morning hours of yesterday morning at around 2am (approximately 36 hours ago). BP was 88/50 when he checked it when symptoms first began. He took his BP medication as normal yesterday and felt normal. This afternoon around 2pm the chest pain and burning sensation to the chest wall returned prompting him to check his BP at home and it was 188/110. He has taken his BP medication today.  Right now, the pain is in the left upper side of the chest wall, last night the pain was all over the chest wall.  Pain is dull and a 3 or 4 out of 10 right now.   Mild cough started 3 days ago on Sunday August 25, 2024.  History of dysphagia causing him to clear his throat often.  Denies fever/chills, malaise, headache, ear pain, sore throat, nausea, vomiting, abdominal pain, rashes, shortness of breath, and heart palpitations.  No recent heavy lifting or injuries to the chest wall.  Denies recent intake of spicy/acidic foods, frequent intake of NSAIDs.  History of GERD, states this does not feel similar. Never smoker, denies history of chronic respiratory problems.   He has not attempted treatment of symptoms at home.        Hypertension Associated symptoms include chest pain.  Chest Pain   Past Medical History:  Diagnosis Date   Adenomatous colon polyp 2009   Anemia    Anxiety    Aortic atherosclerosis    Arthritis    hips, knees (01/27/2016)   Complication of anesthesia    bad dreams-OCC PASSES OUT AFTER ANETHESIA   Depression    Diverticulitis    Diverticulosis    Ear infection ear infection  lasting past 2 months, still ongoing, pt on antibiotics & drops. 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   Right Cervical Paraganglioma   Vocal cord paralysis    Right    Patient Active Problem List   Diagnosis Date Noted   Skin cancer 06/03/2024   Degenerative joint disease (DJD) of hip 01/05/2016   Atypical chest pain 04/22/2014   Gastric reflux with aspiration 03/04/2014   Carpal tunnel syndrome 11/26/2013   HTN (hypertension) 11/26/2013   AR (allergic rhinitis) 11/26/2013   Low sodium levels 03/13/2013   BPH associated with nocturia 08/21/2012   Hyperlipidemia 08/21/2012   Recurrent aspiration bronchitis/pneumonia 10/05/2011   Diverticulosis 10/05/2011   Hearing loss of  both ears 10/05/2011   Depression 10/05/2011   Vocal cord paralysis 01/17/2007    Past Surgical History:  Procedure Laterality Date   APPENDECTOMY     BACK SURGERY     LOWER   CAROTID BODY TUMOR EXCISION     CERVICAL PARAGANGLIOMA EXCISION Right 12/2006   CHOLECYSTECTOMY OPEN     CYST EXCISION Left 12/17/2019   Procedure: CYST EXCISION; EXCISION NAIL HORN REMNANT LEFT RING FINGER;  Surgeon: Murrell Kuba, MD;  Location: [facility];  Service: Orthopedics;  Laterality: Left;  IV REGIONAL   CYSTOSCOPY WITH INSERTION OF UROLIFT N/A 10/12/2020   Procedure: CYSTOSCOPY WITH INSERTION OF UROLIFT;  Surgeon: Penne Knee, MD;  Location: [facility];  Service: Urology;  Laterality: N/A;   DEEP NECK  LYMPH NODE BIOPSY / EXCISION     DIGIT NAIL REMOVAL Left 07/30/2019   Procedure: REMOVAL OF NAIL HORNS LEFT RING FINGER;  Surgeon: Murrell Kuba, MD;  Location: [facility];  Service: Orthopedics;  Laterality: Left;  IV REGIONAL FOREARM BLOCK   ESOPHAGOGASTRODUODENOSCOPY (EGD) WITH ESOPHAGEAL DILATION     INGUINAL HERNIA REPAIR Right 1954   INGUINAL HERNIA REPAIR Left 10/15/2021   Procedure: OPEN LEFT INGUINAL HERNIA REPAIR;  Surgeon: Dasie Leonor CROME, MD;  Location: [facility];  Service: General;  Laterality: Left;   INSERTION OF MESH N/A 10/15/2021   Procedure: INSERTION OF MESH;  Surgeon: Dasie Leonor CROME, MD;  Location: [facility];  Service: General;  Laterality: N/A;   JOINT REPLACEMENT     KNEE ARTHROSCOPY Right    LUMBAR LAMINECTOMY/DECOMPRESSION MICRODISCECTOMY Left 03/09/2013   Procedure: LUMBAR LAMINECTOMY/DECOMPRESSION MICRODISCECTOMY 1 LEVEL;  Surgeon: Alm GORMAN Molt, MD;  Location: [facility];  Service: Neurosurgery;  Laterality: Left;  Left lumbar three-four extra-foraminal microdiscectomy   PATELLA ARTHROPLASTY Right    has had 5 surgeries right knee   TOTAL HIP ARTHROPLASTY Left 01/27/2016   TOTAL HIP ARTHROPLASTY Left 01/27/2016   Procedure: TOTAL HIP ARTHROPLASTY ANTERIOR APPROACH;  Surgeon: Dempsey Sensor, MD;  Location: [facility];  Service: Orthopedics;  Laterality: Left;   UPPER GASTROINTESTINAL ENDOSCOPY     VOCAL CORD IMPLANT         Home Medications    Prior to Admission medications   Medication Sig Start Date End Date Taking? Authorizing Provider  albuterol  (VENTOLIN  HFA) 108 (90 Base) MCG/ACT inhaler Inhale 2 puffs into the lungs every 6 (six) hours as needed for wheezing or shortness of breath. 02/15/21   Vivienne Delon HERO, PA-C  amLODipine  (NORVASC ) 5 MG tablet Take 1 tablet (5 mg total) by mouth daily. 06/07/21   Copland, Harlene BROCKS, MD  aspirin  EC 81 MG tablet Take 1 tablet by mouth twice daily for 2 weeks and then once daily for 2 weeks Patient taking differently:  Take 81 mg by mouth daily. 06/09/21     benzonatate  (TESSALON ) 100 MG capsule Take 1 capsule (100 mg total) by mouth 3 (three) times daily as needed. 08/28/24   Vivienne Delon HERO, PA-C  buPROPion  (WELLBUTRIN  XL) 150 MG 24 hr tablet Take 3 tablets (450 mg total) by mouth daily. Patient taking differently: Take 450 mg by mouth in the morning and at bedtime. 07/05/16   Juliane Che, PA  celecoxib  (CELEBREX ) 200 MG capsule Take 1 capsule by mouth twice a day 06/09/21     cetirizine  (ZYRTEC ) 10 MG tablet Take 1 tablet (10 mg total) by mouth daily. 07/14/23   Soldatova, Liuba, MD  doxycycline  (ADOXA) 100 MG tablet Take  1 tablet (100 mg) by mouth twice a day with meals. 07/11/24     ferrous sulfate 325 (65 FE) MG tablet Take 325 mg by mouth every Monday, Wednesday, and Friday.    [provider]  finasteride  (PROSCAR ) 2.5 mg tablet Take 5 mg by mouth daily.    [provider]  fluticasone  (FLONASE ) 50 MCG/ACT nasal spray Place 2 sprays into both nostrils daily. 08/28/24   Vivienne Delon HERO, PA-C  glycopyrrolate  (ROBINUL ) 1 MG tablet Take 1 tablet (1 mg total) by mouth 3 (three) times daily. Patient taking differently: Take 1 mg by mouth 2 (two) times daily. 04/20/18   Copland, Harlene BROCKS, MD  ketoconazole  (NIZORAL ) 2 % shampoo Apply 1 small Application topically daily. 02/22/24     losartan  (COZAAR ) 100 MG tablet Take 100 mg by mouth every morning.    [provider]  meclizine  (ANTIVERT ) 25 MG tablet Take 1 tablet (25 mg total) by mouth 3 (three) times daily as needed for dizziness. 01/01/22   Jeannetta Channing CROME, NP  omeprazole  (PRILOSEC) 20 MG capsule Take 20 mg by mouth every morning.    [provider]  ondansetron  (ZOFRAN ) 4 MG tablet Take 1 tablet by mouth every six to eight hours as needed 06/09/21     pravastatin  (PRAVACHOL ) 20 MG tablet Take 1 tablet (20 mg total) by mouth daily. 06/09/15   Jeffrie Oneil BROCKS, MD  traZODone  (DESYREL ) 50 MG tablet Take 0.5-1 tablets  (25-50 mg total) by mouth at bedtime as needed for sleep. 07/03/24   Copland, Harlene BROCKS, MD  triamcinolone  cream (KENALOG ) 0.1 % Apply 1 a small amount to affected area twice a day 08/19/21     triamcinolone  cream (KENALOG ) 0.5 % Apply 1 application topically 2 (two) times daily. Use as needed on rash spots 07/26/21   Copland, Harlene BROCKS, MD    Family History Family History  Problem Relation Age of Onset   Arthritis Brother        back   Emphysema Brother    Cancer Brother        rare cancer-unknown type   Heart disease Mother    Emphysema Father        smoker   Colon cancer Neg Hx    Esophageal cancer Neg Hx    Rectal cancer Neg Hx    Stomach cancer Neg Hx     Social History Social History   Tobacco Use   Smoking status: Never   Smokeless tobacco: Never  Vaping Use   Vaping status: Never Used  Substance Use Topics   Alcohol use: Yes    Alcohol/week: 3.0 standard drinks of alcohol    Types: 3 Cans of beer per week    Comment: 2 drinks per month   Drug use: No     Allergies   Sulfa antibiotics, Hydrocodone , and Penicillins   Review of Systems Review of Systems  Cardiovascular:  Positive for chest pain.  Per HPI   Physical Exam Triage Vital Signs ED Triage Vitals  Encounter Vitals Group     BP 08/28/24 1815 (!) 116/106     Girls Systolic BP Percentile --      Girls Diastolic BP Percentile --      Boys Systolic BP Percentile --      Boys Diastolic BP Percentile --      Pulse Rate 08/28/24 1815 86     Resp 08/28/24 1815 17     Temp 08/28/24 1815 97.9 F (36.6 C)  Temp src --      SpO2 08/28/24 1815 96 %     Weight --      Height --      Head Circumference --      Peak Flow --      Pain Score 08/28/24 1816 0     Pain Loc --      Pain Education --      Exclude from Growth Chart --    No data found.  Updated Vital Signs BP (!) 188/107 (BP Location: Left Arm)   Pulse 86   Temp 97.9 F (36.6 C)   Resp 17   SpO2 96%   Visual Acuity Right Eye  Distance:   Left Eye Distance:   Bilateral Distance:    Right Eye Near:   Left Eye Near:    Bilateral Near:     Physical Exam Vitals and nursing note reviewed.  Constitutional:      Appearance: He is not ill-appearing or toxic-appearing.  HENT:     Head: Normocephalic and atraumatic.     Right Ear: Hearing, tympanic membrane, ear canal and external ear normal.     Left Ear: Hearing, tympanic membrane, ear canal and external ear normal.     Nose: Nose normal.     Mouth/Throat:     Lips: Pink.     Mouth: Mucous membranes are moist. No injury or oral lesions.     Dentition: Normal dentition.     Tongue: No lesions.     Pharynx: Oropharynx is clear. Uvula midline. No pharyngeal swelling, oropharyngeal exudate, posterior oropharyngeal erythema, uvula swelling or postnasal drip.     Tonsils: No tonsillar exudate.  Eyes:     General: Lids are normal. Vision grossly intact. Gaze aligned appropriately.     Extraocular Movements: Extraocular movements intact.     Conjunctiva/sclera: Conjunctivae normal.  Neck:     Trachea: Trachea and phonation normal.  Cardiovascular:     Rate and Rhythm: Normal rate and regular rhythm.     Heart sounds: Normal heart sounds, S1 normal and S2 normal.  Pulmonary:     Effort: Pulmonary effort is normal. No respiratory distress.     Breath sounds: Normal breath sounds and air entry.  Chest:     Chest wall: No tenderness (Chest pain is not reproducible to palpation over the chest wall.  No rash overlying skin of the chest wall.).  Musculoskeletal:     Cervical back: Neck supple.  Lymphadenopathy:     Cervical: No cervical adenopathy.  Skin:    General: Skin is warm and dry.     Capillary Refill: Capillary refill takes less than 2 seconds.     Findings: No rash.  Neurological:     General: No focal deficit present.     Mental Status: He is alert and oriented to person, place, and time. Mental status is at baseline.     Cranial Nerves: No dysarthria  or facial asymmetry.  Psychiatric:        Mood and Affect: Mood normal.        Speech: Speech normal.        Behavior: Behavior normal.        Thought Content: Thought content normal.        Judgment: Judgment normal.      UC Treatments / Results  Labs (all labs ordered are listed, but only abnormal results are displayed) Labs Reviewed - No data to display  EKG   Radiology  No results found.  Procedures Procedures (including critical care time)  Medications Ordered in UC Medications - No data to display  Initial Impression / Assessment and Plan / UC Course  I have reviewed the triage vital signs and the nursing notes.  Pertinent labs & imaging results that were available during my care of the patient were reviewed by me and considered in my medical decision making (see chart for details).   1.  Chest pain, hypertensive urgency Chest pain is not reproducible to palpation on exam. EKG shows normal sinus rhythm with low voltage QRS, EKG findings are to baseline per review of previous EKG on file.  No ST or T wave changes, no ischemia.  I would like for him to proceed to the emergency room for further workup and evaluation with labs that we are unable to obtain here in the urgent care (troponins) to rule out ACS versus musculoskeletal chest pain caused by viral URI versus GERD. Low suspicion for viral URI as he has chronic cough/dysphagia and is not experiencing any associated malaise, fatigue, ear pain, sore throat, headache, or rhinorrhea/nasal congestion. Patient and his wife are agreeable with plan to proceed to ER for further workup.   Discussed clinical concerns/exam findings leading to recommendation for further workup in the ER setting and risks of deferring ER visit with patient/family. Patient/family express understanding and agreement with plan, discharged to ER via private car.   Final Clinical Impressions(s) / UC Diagnoses   Final diagnoses:  Chest pain,  unspecified type  Hypertensive urgency   Discharge Instructions   None    ED Prescriptions   None    PDMP not reviewed this encounter.   Enedelia Dorna HERO, [place] 08/28/24 1920

## 2024-08-28 NOTE — ED Triage Notes (Signed)
 Pt coming today for burning sensation in chest and elevated BP that started 2 days ago. Has taken losartan  this morning. Denies blurred vision and dizziness. BP was 188/110 at 5:15.

## 2024-08-28 NOTE — Progress Notes (Signed)

## 2024-08-29 ENCOUNTER — Encounter (HOSPITAL_COMMUNITY): Payer: Self-pay

## 2024-08-29 ENCOUNTER — Emergency Department (HOSPITAL_COMMUNITY)
Admission: EM | Admit: 2024-08-29 | Discharge: 2024-08-30 | Disposition: A | Discharge status: Home / Self Care | Attending: Emergency Medicine | Admitting: Emergency Medicine

## 2024-08-29 DIAGNOSIS — I200 Unstable angina: Secondary | ICD-10-CM

## 2024-08-29 DIAGNOSIS — Z79899 Other long term (current) drug therapy: Secondary | ICD-10-CM | POA: Insufficient documentation

## 2024-08-29 DIAGNOSIS — Z7982 Long term (current) use of aspirin: Secondary | ICD-10-CM | POA: Diagnosis not present

## 2024-08-29 DIAGNOSIS — R079 Chest pain, unspecified: Secondary | ICD-10-CM | POA: Diagnosis not present

## 2024-08-29 DIAGNOSIS — R072 Precordial pain: Secondary | ICD-10-CM | POA: Diagnosis not present

## 2024-08-29 DIAGNOSIS — I10 Essential (primary) hypertension: Secondary | ICD-10-CM | POA: Diagnosis not present

## 2024-08-29 DIAGNOSIS — I2510 Atherosclerotic heart disease of native coronary artery without angina pectoris: Secondary | ICD-10-CM | POA: Diagnosis not present

## 2024-08-29 LAB — BASIC METABOLIC PANEL WITH GFR
Anion gap: 8 (ref 5–15)
BUN: 15 mg/dL (ref 8–23)
CO2: 27 mmol/L (ref 22–32)
Calcium: 8.4 mg/dL — ABNORMAL LOW (ref 8.9–10.3)
Chloride: 96 mmol/L — ABNORMAL LOW (ref 98–111)
Creatinine, Ser: 0.99 mg/dL (ref 0.61–1.24)
GFR, Estimated: >60 mL/min (ref >60)
Glucose, Bld: 103 mg/dL — ABNORMAL HIGH (ref 70–99)
Potassium: 4.3 mmol/L (ref 3.5–5.1)
Sodium: 131 mmol/L — ABNORMAL LOW (ref 135–145)

## 2024-08-29 LAB — CBC
HCT: 38.7 % — ABNORMAL LOW (ref 39.0–52.0)
Hemoglobin: 12.8 g/dL — ABNORMAL LOW (ref 13.0–17.0)
MCH: 30.5 pg (ref 26.0–34.0)
MCHC: 33.1 g/dL (ref 30.0–36.0)
MCV: 92.4 fL (ref 80.0–100.0)
Platelets: 207 K/uL (ref 150–400)
RBC: 4.19 MIL/uL — ABNORMAL LOW (ref 4.22–5.81)
RDW: 13.5 % (ref 11.5–15.5)
WBC: 9.4 K/uL (ref 4.0–10.5)
nRBC: 0 % (ref 0.0–0.2)

## 2024-08-29 LAB — TROPONIN I (HIGH SENSITIVITY)
Troponin I (High Sensitivity): 5 ng/L (ref <18)
Troponin I (High Sensitivity): 5 ng/L (ref <18)

## 2024-08-29 NOTE — ED Triage Notes (Signed)
 See Quick Triage note.  Pt was sent by [facility] for a CT angio.  Pt c/o intermittent chest pain x4 days.  Denies SOB.    Pt was seen at [facility] today to get a Cardiac referral after being seen at [facility] yesterday for same.

## 2024-08-29 NOTE — ED Triage Notes (Addendum)
 Quick triage note: Pt to ED c/o CP x 4 days intermittent in nature. Reports coming from [place], pt was evaluated for the same yesterday at [facility] , pt denies N/V SHOB. Denies alleviating or aggravating factors.

## 2024-08-30 ENCOUNTER — Other Ambulatory Visit (HOSPITAL_COMMUNITY): Payer: Self-pay

## 2024-08-30 ENCOUNTER — Other Ambulatory Visit: Payer: Self-pay | Admitting: Cardiology

## 2024-08-30 ENCOUNTER — Encounter (HOSPITAL_COMMUNITY): Payer: Self-pay

## 2024-08-30 ENCOUNTER — Emergency Department (HOSPITAL_COMMUNITY)

## 2024-08-30 DIAGNOSIS — R072 Precordial pain: Secondary | ICD-10-CM | POA: Diagnosis not present

## 2024-08-30 DIAGNOSIS — R079 Chest pain, unspecified: Secondary | ICD-10-CM

## 2024-08-30 DIAGNOSIS — I2510 Atherosclerotic heart disease of native coronary artery without angina pectoris: Secondary | ICD-10-CM

## 2024-08-30 LAB — HEPATIC FUNCTION PANEL
ALT: 16 U/L (ref 0–44)
AST: 14 U/L — ABNORMAL LOW (ref 15–41)
Albumin: 3.4 g/dL — ABNORMAL LOW (ref 3.5–5.0)
Alkaline Phosphatase: 72 U/L (ref 38–126)
Bilirubin, Direct: 0.4 mg/dL — ABNORMAL HIGH (ref 0.0–0.2)
Indirect Bilirubin: 1.4 mg/dL — ABNORMAL HIGH (ref 0.3–0.9)
Total Bilirubin: 1.8 mg/dL — ABNORMAL HIGH (ref 0.0–1.2)
Total Protein: 7.7 g/dL (ref 6.5–8.1)

## 2024-08-30 LAB — BRAIN NATRIURETIC PEPTIDE: B Natriuretic Peptide: 47.1 pg/mL (ref 0.0–100.0)

## 2024-08-30 MED ORDER — IOHEXOL 350 MG/ML SOLN
75.0000 mL | Freq: Once | INTRAVENOUS | Status: AC | PRN
  Administered 2024-08-30: 75 mL via INTRAVENOUS

## 2024-08-30 MED ORDER — ONDANSETRON HCL 4 MG/2ML IJ SOLN
4.0000 mg | Freq: Once | INTRAMUSCULAR | Status: AC
  Administered 2024-08-30: 4 mg via INTRAVENOUS
  Filled 2024-08-30: qty 2

## 2024-08-30 MED ORDER — METOPROLOL TARTRATE 50 MG PO TABS
ORAL_TABLET | ORAL | 0 refills | Status: AC
  Filled 2024-08-30: qty 1, 1d supply, fill #0

## 2024-08-30 NOTE — ED Provider Notes (Signed)
 [place] EMERGENCY DEPARTMENT AT [facility] Provider Note   CSN: [id number] Arrival date & time: 08/29/24  1618     Patient presents with: Chest Pain   Chris Welch is a 78 y.o. male.   78 y.o male with a PMH of Depression, GERD, HTN presents to the ED with a chief complaint of chest pain x 5 days. Patient reports a generalized intermittent burning sensation across his entire chest. No alleviating factors. There is some exacerbation with activity. Evaluated at [facility] and discharged after a reassuring workup. He was then followed up at the [facility] and sent in to the minute clinic. His wife reports he usually plays golf about 3 x a week but has not been doing so as he feels unwell. Patient has not had any recent cardiac workup done. No leg swelling, no fever, no upper respirator symptoms.   The history is provided by the patient.  Chest Pain Pain location:  Substernal area Pain quality: burning   Pain radiates to:  Does not radiate Pain severity:  Moderate Onset quality:  Gradual Duration:  5 days Timing:  Intermittent Progression:  Worsening Chronicity:  New Context: not drug use   Relieved by:  Nothing Worsened by:  Exertion Associated symptoms: palpitations and shortness of breath   Associated symptoms: no abdominal pain, no back pain, no fever, no headache, no nausea and no vomiting        Prior to Admission medications  Medication Sig Start Date End Date Taking? Authorizing Provider  metoprolol  tartrate (LOPRESSOR ) 50 MG tablet Please take 1 tab (50mg ) 90-129mins prior to your CT scan 08/30/24  Yes Henry Manuelita NOVAK, NP  albuterol  (VENTOLIN  HFA) 108 (90 Base) MCG/ACT inhaler Inhale 2 puffs into the lungs every 6 (six) hours as needed for wheezing or shortness of breath. 02/15/21   Vivienne Delon HERO, PA-C  amLODipine  (NORVASC ) 5 MG tablet Take 1 tablet (5 mg total) by mouth daily. 06/07/21   Copland, Harlene BROCKS, MD  aspirin  EC 81 MG tablet  Take 1 tablet by mouth twice daily for 2 weeks and then once daily for 2 weeks Patient taking differently: Take 81 mg by mouth daily. 06/09/21     benzonatate  (TESSALON ) 100 MG capsule Take 1 capsule (100 mg total) by mouth 3 (three) times daily as needed. 08/28/24   Vivienne Delon HERO, PA-C  buPROPion  (WELLBUTRIN  XL) 150 MG 24 hr tablet Take 3 tablets (450 mg total) by mouth daily. Patient taking differently: Take 450 mg by mouth in the morning and at bedtime. 07/05/16   Juliane Che, PA  celecoxib  (CELEBREX ) 200 MG capsule Take 1 capsule by mouth twice a day 06/09/21     cetirizine  (ZYRTEC ) 10 MG tablet Take 1 tablet (10 mg total) by mouth daily. 07/14/23   Soldatova, [place], MD  doxycycline  (ADOXA) 100 MG tablet Take 1 tablet (100 mg) by mouth twice a day with meals. 07/11/24     ferrous sulfate 325 (65 FE) MG tablet Take 325 mg by mouth every Monday, Wednesday, and Friday.    [provider]  finasteride  (PROSCAR ) 2.5 mg tablet Take 5 mg by mouth daily.    [provider]  fluticasone  (FLONASE ) 50 MCG/ACT nasal spray Place 2 sprays into both nostrils daily. 08/28/24   Vivienne Delon HERO, PA-C  glycopyrrolate  (ROBINUL ) 1 MG tablet Take 1 tablet (1 mg total) by mouth 3 (three) times daily. Patient taking differently: Take 1 mg by mouth 2 (two) times daily.  04/20/18   Copland, Harlene BROCKS, MD  ketoconazole  (NIZORAL ) 2 % shampoo Apply 1 small Application topically daily. 02/22/24     losartan  (COZAAR ) 100 MG tablet Take 100 mg by mouth every morning.    [provider]  meclizine  (ANTIVERT ) 25 MG tablet Take 1 tablet (25 mg total) by mouth 3 (three) times daily as needed for dizziness. 01/01/22   Jeannetta Channing CROME, NP  omeprazole  (PRILOSEC) 20 MG capsule Take 20 mg by mouth every morning.    [provider]  ondansetron  (ZOFRAN ) 4 MG tablet Take 1 tablet by mouth every six to eight hours as needed 06/09/21     pravastatin  (PRAVACHOL ) 20 MG tablet Take 1 tablet (20 mg  total) by mouth daily. 06/09/15   Jeffrie Oneil BROCKS, MD  traZODone  (DESYREL ) 50 MG tablet Take 0.5-1 tablets (25-50 mg total) by mouth at bedtime as needed for sleep. 07/03/24   Copland, Jessica C, MD  triamcinolone  cream (KENALOG ) 0.1 % Apply 1 a small amount to affected area twice a day 08/19/21     triamcinolone  cream (KENALOG ) 0.5 % Apply 1 application topically 2 (two) times daily. Use as needed on rash spots 07/26/21   Copland, Harlene BROCKS, MD    Allergies: Sulfa antibiotics, Hydrocodone , and Penicillins    Review of Systems  Constitutional:  Negative for chills and fever.  HENT:  Negative for sore throat.   Respiratory:  Positive for shortness of breath.   Cardiovascular:  Positive for chest pain and palpitations. Negative for leg swelling.  Gastrointestinal:  Negative for abdominal pain, nausea and vomiting.  Genitourinary:  Negative for flank pain.  Musculoskeletal:  Negative for back pain.  Neurological:  Negative for light-headedness and headaches.  All other systems reviewed and are negative.   Updated Vital Signs BP (!) 140/83   Pulse 78   Temp 98 F (36.7 C) (Oral)   Resp 18   Ht 6' 3 (1.905 m)   Wt 102.1 kg   SpO2 98%   BMI 28.12 kg/m   Physical Exam Vitals and nursing note reviewed.  Constitutional:      Appearance: He is well-developed.  HENT:     Head: Normocephalic and atraumatic.  Cardiovascular:     Rate and Rhythm: Normal rate.     Comments: No pitting edema.  Pulmonary:     Effort: Pulmonary effort is normal.     Breath sounds: Normal breath sounds. No decreased breath sounds.  Chest:     Chest wall: No tenderness.  Abdominal:     Palpations: Abdomen is soft.  Musculoskeletal:     Cervical back: Normal range of motion and neck supple.     Right lower leg: No edema.     Left lower leg: No edema.  Skin:    General: Skin is warm and dry.  Neurological:     Mental Status: He is alert and oriented to person, place, and time.     (all labs ordered  are listed, but only abnormal results are displayed) Labs Reviewed  BASIC METABOLIC PANEL WITH GFR - Abnormal; Notable for the following components:      Result Value   Sodium 131 (*)    Chloride 96 (*)    Glucose, Bld 103 (*)    Calcium 8.4 (*)    All other components within normal limits  CBC - Abnormal; Notable for the following components:   RBC 4.19 (*)    Hemoglobin 12.8 (*)    HCT 38.7 (*)  All other components within normal limits  HEPATIC FUNCTION PANEL - Abnormal; Notable for the following components:   Albumin  3.4 (*)    AST 14 (*)    Total Bilirubin 1.8 (*)    Bilirubin, Direct 0.4 (*)    Indirect Bilirubin 1.4 (*)    All other components within normal limits  BRAIN NATRIURETIC PEPTIDE  TROPONIN I (HIGH SENSITIVITY)  TROPONIN I (HIGH SENSITIVITY)    EKG: EKG Interpretation Date/Time:  Friday August 30 2024 07:48:55 EST Ventricular Rate:  82 PR Interval:  172 QRS Duration:  84 QT Interval:  392 QTC Calculation: 457 R Axis:   27  Text Interpretation: Normal sinus rhythm Low voltage QRS Septal infarct , age undetermined When compared with ECG of 29-Aug-2024 17:48, PREVIOUS ECG IS PRESENT No significant change since last tracing Confirmed by Doretha Folks ([id number]) on 08/30/2024 7:51:06 AM  Radiology: CT Angio Chest PE W and/or Wo Contrast Result Date: 08/30/2024 CLINICAL DATA:  Chest pain EXAM: CT ANGIOGRAPHY CHEST WITH CONTRAST TECHNIQUE: Multidetector CT imaging of the chest was performed using the standard protocol during bolus administration of intravenous contrast. Multiplanar CT image reconstructions and MIPs were obtained to evaluate the vascular anatomy. RADIATION DOSE REDUCTION: This exam was performed according to the departmental dose-optimization program which includes automated exposure control, adjustment of the mA and/or kV according to patient size and/or use of iterative reconstruction technique. CONTRAST:  75mL OMNIPAQUE  IOHEXOL  350 MG/ML SOLN  COMPARISON:  June 05, 2020 FINDINGS: Cardiovascular: Satisfactory opacification of the pulmonary arteries to the segmental level. No evidence of pulmonary embolism. Normal heart size. No pericardial effusion. Coronary artery calcifications are noted. Mediastinum/Nodes: No enlarged mediastinal, hilar, or axillary lymph nodes. Thyroid gland, trachea, and esophagus demonstrate no significant findings. Lungs/Pleura: No pneumothorax or pleural effusion. Minimal left basilar subsegmental atelectasis is noted. Upper Abdomen: No acute abnormality. Musculoskeletal: No chest wall abnormality. No acute or significant osseous findings. Review of the MIP images confirms the above findings. IMPRESSION: 1. No definite evidence of pulmonary embolus. 2. Coronary artery calcifications are noted suggesting coronary artery disease. 3. Minimal left basilar subsegmental atelectasis. Electronically Signed   By: Lynwood Landy Raddle M.D.   On: 08/30/2024 09:58   DG Chest 2 View Result Date: 08/28/2024 EXAM: 2 VIEW(S) XRAY OF THE CHEST 08/28/2024 08:22:00 PM COMPARISON: 02/16/2021 CLINICAL HISTORY: pain FINDINGS: LUNGS AND PLEURA: No focal pulmonary opacity. No pleural effusion. No pneumothorax. HEART AND MEDIASTINUM: No acute abnormality of the cardiac and mediastinal silhouettes. BONES AND SOFT TISSUES: Surgical clips in the right neck. No acute osseous abnormality. IMPRESSION: 1. No acute cardiopulmonary process. Electronically signed by: Pinkie Pebbles MD 08/28/2024 08:25 PM EST RP Workstation: [id number]     Procedures   Medications Ordered in the ED  iohexol  (OMNIPAQUE ) 350 MG/ML injection 75 mL (75 mLs Intravenous Contrast Given 08/30/24 0926)  ondansetron  (ZOFRAN ) injection 4 mg (4 mg Intravenous Given 08/30/24 1435)                                    Medical Decision Making Amount and/or Complexity of Data Reviewed Labs: ordered. Radiology: ordered.  Risk Prescription drug management.   This patient  presents to the ED for concern of chest pain, this involves a number of treatment options, and is a complaint that carries with it a high risk of complications and morbidity.  The differential diagnosis includes ACS, PE versus new onset heart failure.  Co morbidities: Discussed in HPI   Brief History:  See HPI.   EMR reviewed including pt PMHx, past surgical history and past visits to ER.   See HPI for more details   Lab Tests:  I ordered and independently interpreted labs.  The pertinent results include:    I personally reviewed all laboratory work and imaging. Metabolic panel without any acute abnormality specifically kidney function within normal limits and no significant electrolyte abnormalities. CBC without leukocytosis or significant anemia. Troponin x 2 are within normal limits.   Imaging Studies:  CT Angio Pulmonary embolism showed: IMPRESSION:  1. No definite evidence of pulmonary embolus.  2. Coronary artery calcifications are noted suggesting coronary  artery disease.  3. Minimal left basilar subsegmental atelectasis.   Cardiac Monitoring:  The patient was maintained on a cardiac monitor.  I personally viewed and interpreted the cardiac monitored which showed an underlying rhythm of: NSR 82 EKG non-ischemic   Medicines ordered:   N/A   Consults:  I requested consultation with cardiology,  and discussed lab and imaging findings as well as pertinent plan - I have asked them to evaluate the patient while in the ED.   Reevaluation:  After the interventions noted above I re-evaluated patient and found that they have :improved   Social Determinants of Health:  The patient's social determinants of health were a factor in the care of this patient  Problem List / ED Course:  Patient here with chest pain x 5 days. Evaluated in the ED approximately 5 days ago, had a reassuring cardiac workup.  [facility] clinic yesterday.  He reports he was sent to the emergency department in order to obtain a CT angio to rule out pulmonary embolism.  He tells me he feels somewhat weak, usually play golf 3 times a week however not been able to do so due to to not feeling like he has the energy to.  Sounds like his symptoms are exertional now although they were not 5 days ago.  His blood work here is unremarkable, troponins have remained flat.  EKG is normal sinus rhythm without any arrhythmia or ST elevations. CT angio is negative for pulmonary embolisms.  Seeing as this is patient's third visit for chest pain in the past week I do feel that he warrants some cardiology consultation at least.  Call placed for cardiology for further recommendations.  His wife and him have been updated on process. Patient has been evaluated by cardiology, they are recommending outpatient workup at this time as this is what patient would prefer.  A CT coronary has been scheduled for patient.  He remains hemodynamically stable.  Given additional nausea medicine as he felt a bit nauseated.  Will talk to cardiologist and likely be discharged home.  Patient is awaiting Lopressor  from [facility] pharmacy, hemodynamically stable for discharge.   Dispostion:  After consideration of the diagnostic results and the patients response to treatment, I feel that the patent would benefit from outpatient cardiology follow-up.    Portions of this note were generated with Scientist, clinical (histocompatibility and immunogenetics). Dictation errors may occur despite best attempts at proofreading.   Final diagnoses:  Unstable angina (HCC)  Nonspecific chest pain    ED Discharge Orders          Ordered    metoprolol  tartrate (LOPRESSOR ) 50 MG tablet        08/30/24 1421  Shunta Mclaurin, PA-C 08/30/24 1451    Palumbo, April, MD 08/31/24 [phone]

## 2024-08-30 NOTE — Consult Note (Signed)
 Cardiology Consultation   Patient ID: Chris Welch MRN: [id number]; DOB: 1946-01-10  Admit date: 08/29/2024 Date of Consult: 08/30/2024  PCP:  Watt Harlene BROCKS, MD   [facility] Providers Cardiologist:  Oneil Parchment, MD      Patient Profile: Chris Welch is a 78 y.o. male with a hx of hypertension, hyperlipidemia, GERD, OSA who is being seen 08/30/2024 for the evaluation of chest pain at the request of Dr. Doretha.  History of Present Illness: Chris Welch is a 78 year old male with past medical history noted above.  [facility].  States that Monday morning he woke up around 2 AM with chest burning.  States no other associated symptoms of shortness of breath, diaphoresis or nausea.  Also noted his blood pressure was lower than his usual of 80/60s.  Wife gave him Tylenol  at that time which did seem to help with his symptoms.  Over the next several days he had recurrent episodes of chest burning intermittently, not necessarily associated with exertion.  Presented to urgent care 12/10 with symptoms and was referred to the ED at [facility] for further evaluation.  He underwent workup with negative troponins and was referred to cardiology as an outpatient.  States he attempted to go to the [facility] on 12/11 but was referred back to the ED with concerns for ongoing chest pain and recommendations were CT angio.  [facility]ed [facility] that afternoon.  In the ED labs showed sodium 131, potassium 4.3, creatinine 0.99, BNP 47, high-sensitivity troponin negative x 2, WBC 9.4, hemoglobin 12.8.  EKG showed sinus rhythm, septal infarct age undetermined.  CT angio negative for PE, coronary calcifications noted.  Cardiology asked to evaluate.  In talking with patient he has not had any recurrent chest pain while being in the ED.  States he is very active and likes to play golf 3 times a week.  Has not had any anginal  symptoms with his daily activities or with golfing.   Past Medical History:  Diagnosis Date   Adenomatous colon polyp 2009   Anemia    Anxiety    Aortic atherosclerosis    Arthritis    hips, knees (01/27/2016)   Complication of anesthesia    bad dreams-OCC PASSES OUT AFTER ANETHESIA   Depression    Diverticulitis    Diverticulosis    Ear infection ear infection lasting past 2 months, still ongoing, pt on antibiotics & drops. 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   Right Cervical Paraganglioma   Vocal cord paralysis    Right    Past Surgical History:  Procedure Laterality Date   APPENDECTOMY     BACK SURGERY     LOWER   CAROTID BODY TUMOR EXCISION     CERVICAL PARAGANGLIOMA EXCISION Right  12/2006   CHOLECYSTECTOMY OPEN     CYST EXCISION Left 12/17/2019   Procedure: CYST EXCISION; EXCISION NAIL HORN REMNANT LEFT RING FINGER;  Surgeon: Murrell Kuba, MD;  Location: [facility];  Service: Orthopedics;  Laterality: Left;  IV REGIONAL   CYSTOSCOPY WITH INSERTION OF UROLIFT N/A 10/12/2020   Procedure: CYSTOSCOPY WITH INSERTION OF UROLIFT;  Surgeon: Penne Knee, MD;  Location: [facility];  Service: Urology;  Laterality: N/A;   DEEP NECK LYMPH NODE BIOPSY / EXCISION     DIGIT NAIL REMOVAL Left 07/30/2019   Procedure: REMOVAL OF NAIL HORNS LEFT RING FINGER;  Surgeon: Murrell Kuba, MD;  Location: [facility];  Service: Orthopedics;  Laterality: Left;  IV  REGIONAL FOREARM BLOCK   ESOPHAGOGASTRODUODENOSCOPY (EGD) WITH ESOPHAGEAL DILATION     INGUINAL HERNIA REPAIR Right 1954   INGUINAL HERNIA REPAIR Left 10/15/2021   Procedure: OPEN LEFT INGUINAL HERNIA REPAIR;  Surgeon: Dasie Leonor CROME, MD;  Location: [facility];  Service: General;  Laterality: Left;   INSERTION OF MESH N/A 10/15/2021   Procedure: INSERTION OF MESH;  Surgeon: Dasie Leonor CROME, MD;  Location: [facility] OR;  Service: General;  Laterality: N/A;   JOINT REPLACEMENT     KNEE ARTHROSCOPY Right    LUMBAR LAMINECTOMY/DECOMPRESSION MICRODISCECTOMY Left 03/09/2013   Procedure: LUMBAR LAMINECTOMY/DECOMPRESSION MICRODISCECTOMY 1 LEVEL;  Surgeon: Alm GORMAN Molt, MD;  Location: [facility];  Service: Neurosurgery;  Laterality: Left;  Left lumbar three-four extra-foraminal microdiscectomy   PATELLA ARTHROPLASTY Right    has had 5 surgeries right knee   TOTAL HIP ARTHROPLASTY Left 01/27/2016   TOTAL HIP ARTHROPLASTY Left 01/27/2016   Procedure: TOTAL HIP ARTHROPLASTY ANTERIOR APPROACH;  Surgeon: Dempsey Sensor, MD;  Location: [facility] OR;  Service: Orthopedics;  Laterality: Left;   UPPER GASTROINTESTINAL ENDOSCOPY     VOCAL CORD IMPLANT      Scheduled Meds:  Continuous Infusions:  PRN Meds:   Allergies:   Allergies[1]  Social History:   Social History   Socioeconomic History   Marital status: Married    Spouse name: Chris Welch   Number of children: 4   Years of education: 16   Highest education level: Not on file  Occupational History   Occupation: Machinist-retired 2015    Employer: [employer]    Comment: [employer]   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  Three children are grown.  Younger daughter will graduate from [employer] in 01/2013.  Both sons and oldest daughter are in [employer]. Exercises regularly.   His wife retired 12/2015 and is enjoying spending time in the yard, and with their family.   Social Drivers of Health   Tobacco Use: Low Risk (08/29/2024)   Patient History    Smoking Tobacco Use: Never    Smokeless Tobacco Use: Never    Passive Exposure: Not on file  Financial Resource Strain: Low Risk (06/12/2024)   Overall Financial Resource Strain (CARDIA)  Difficulty of Paying Living Expenses: Not very hard  Food Insecurity: No Food Insecurity (06/12/2024)   Epic    Worried About Programme Researcher, Broadcasting/film/video in the Last Year: Never true    Ran Out of Food in the Last Year: Never true  Transportation Needs: No Transportation Needs (06/12/2024)   Epic    Lack of Transportation (Medical): No    Lack of Transportation (Non-Medical): No  Physical Activity: Sufficiently Active (06/12/2024)   Exercise Vital Sign    Days of Exercise per Week: 3 days    Minutes of Exercise per Session: 60 min  Stress: Stress Concern Present (06/12/2024)   [employer] of Occupational Health - Occupational Stress Questionnaire    Feeling of Stress: To some extent  Social Connections: Moderately Integrated (06/12/2024)   Social Connection and Isolation Panel    Frequency of Communication with Friends and Family: More than three times a week    Frequency of Social Gatherings with Friends and Family: Three times a week    Attends Religious Services: Never    Active Member of Clubs or Organizations: Yes    Attends Banker Meetings: More than 4 times per year    Marital Status: Married  Catering Manager Violence: Not At Risk (06/12/2024)   Epic    Fear of Current or Ex-Partner: No    Emotionally Abused: No    Physically Abused: No    Sexually Abused: No   Depression (PHQ2-9): High Risk (07/03/2024)   Depression (PHQ2-9)    PHQ-2 Score: 12  Alcohol Screen: Low Risk (06/12/2024)   Alcohol Screen    Last Alcohol Screening Score (AUDIT): 1  Housing: Low Risk (06/12/2024)   Epic    Unable to Pay for Housing in the Last Year: No    Number of Times Moved in the Last Year: 0    Homeless in the Last Year: No  Utilities: Not At Risk (06/12/2024)   Epic    Threatened with loss of utilities: No  Health Literacy: Adequate Health Literacy (06/12/2024)   B1300 Health Literacy    Frequency of need for help with medical instructions: Never    Family History:    Family History  Problem Relation Age of Onset   Arthritis Brother        back   Emphysema Brother    Cancer Brother        rare cancer-unknown type   Heart disease Mother    Emphysema Father        smoker   Colon cancer Neg Hx    Esophageal cancer Neg Hx    Rectal cancer Neg Hx    Stomach cancer Neg Hx      ROS:  Please see the history of present illness.   All other ROS reviewed and negative.     Physical Exam/Data: Vitals:   08/29/24 2037 08/30/24 0053 08/30/24 0540 08/30/24 0938  BP: (!) 147/96 (!) 172/90 (!) 148/96 (!) 143/87  Pulse: 75 78 80 84  Resp: 16 20 16 18   Temp: 98.6 F (37 C) (!) 97.5 F (36.4 C) 97.8 F (36.6 C) 97.8 F (36.6 C)  TempSrc:    Oral  SpO2: 98% 96% 97% 97%  Weight:      Height:       No intake or output data in the 24 hours ending 08/30/24 1423    08/29/2024    5:26 PM 08/28/2024    8:04 PM 07/03/2024   10:01 AM  Last  3 Weights  Weight (lbs) 225 lb 225 lb 221 lb  Weight (kg) 102.059 kg 102.059 kg 100.245 kg     Body mass index is 28.12 kg/m.  General:  Well nourished, well developed, in no acute distress HEENT: normal Neck: no JVD Vascular: No carotid bruits; Distal pulses 2+ bilaterally Cardiac:  normal S1, S2; RRR; no murmur  Lungs:  clear to auscultation bilaterally, no wheezing, rhonchi or rales  Abd: soft, nontender, no  hepatomegaly  Ext: no edema Musculoskeletal:  No deformities, BUE and BLE strength normal and equal Skin: warm and dry  Neuro: no focal abnormalities noted Psych:  Normal affect   EKG:  The EKG was personally reviewed and demonstrates:  sinus rhythm, septal infarct age undetermined Telemetry:  Telemetry was personally reviewed and demonstrates:  N/a   Relevant CV Studies:  N/a   Laboratory Data: High Sensitivity Troponin:   Recent Labs  Lab 08/29/24 1738 08/29/24 1942  TROPONINIHS 5 5    Recent Labs  Lab 08/28/24 2105 08/28/24 2152  TRNPT <15 <15      Chemistry Recent Labs  Lab 08/28/24 2105 08/29/24 1738  NA 131* 131*  K 4.3 4.3  CL 95* 96*  CO2 25 27  GLUCOSE 112* 103*  BUN 15 15  CREATININE 1.00 0.99  CALCIUM 9.0 8.4*  GFRNONAA >60 >60  ANIONGAP 11 8    Recent Labs  Lab 08/28/24 2105 08/30/24 0730  PROT 7.9 7.7  ALBUMIN  4.1 3.4*  AST 17 14*  ALT 16 16  ALKPHOS 92 72  BILITOT 0.9 1.8*   Lipids No results for input(s): CHOL, TRIG, HDL, LABVLDL, LDLCALC, CHOLHDL in the last 168 hours.  Hematology Recent Labs  Lab 08/28/24 2105 08/29/24 1738  WBC 11.5* 9.4  RBC 4.37 4.19*  HGB 13.1 12.8*  HCT 39.3 38.7*  MCV 89.9 92.4  MCH 30.0 30.5  MCHC 33.3 33.1  RDW 13.3 13.5  PLT 200 207   Thyroid No results for input(s): TSH, FREET4 in the last 168 hours.  BNP Recent Labs  Lab 08/30/24 0730  BNP 47.1    DDimer No results for input(s): DDIMER in the last 168 hours.  Radiology/Studies:  CT Angio Chest PE W and/or Wo Contrast Result Date: 08/30/2024 CLINICAL DATA:  Chest pain EXAM: CT ANGIOGRAPHY CHEST WITH CONTRAST TECHNIQUE: Multidetector CT imaging of the chest was performed using the standard protocol during bolus administration of intravenous contrast. Multiplanar CT image reconstructions and MIPs were obtained to evaluate the vascular anatomy. RADIATION DOSE REDUCTION: This exam was performed according to the departmental  dose-optimization program which includes automated exposure control, adjustment of the mA and/or kV according to patient size and/or use of iterative reconstruction technique. CONTRAST:  75mL OMNIPAQUE  IOHEXOL  350 MG/ML SOLN COMPARISON:  June 05, 2020 FINDINGS: Cardiovascular: Satisfactory opacification of the pulmonary arteries to the segmental level. No evidence of pulmonary embolism. Normal heart size. No pericardial effusion. Coronary artery calcifications are noted. Mediastinum/Nodes: No enlarged mediastinal, hilar, or axillary lymph nodes. Thyroid gland, trachea, and esophagus demonstrate no significant findings. Lungs/Pleura: No pneumothorax or pleural effusion. Minimal left basilar subsegmental atelectasis is noted. Upper Abdomen: No acute abnormality. Musculoskeletal: No chest wall abnormality. No acute or significant osseous findings. Review of the MIP images confirms the above findings. IMPRESSION: 1. No definite evidence of pulmonary embolus. 2. Coronary artery calcifications are noted suggesting coronary artery disease. 3. Minimal left basilar subsegmental atelectasis. Electronically Signed   By: Lynwood Landy Raddle M.D.   On: 08/30/2024  09:58   DG Chest 2 View Result Date: 08/28/2024 EXAM: 2 VIEW(S) XRAY OF THE CHEST 08/28/2024 08:22:00 PM COMPARISON: 02/16/2021 CLINICAL HISTORY: pain FINDINGS: LUNGS AND PLEURA: No focal pulmonary opacity. No pleural effusion. No pneumothorax. HEART AND MEDIASTINUM: No acute abnormality of the cardiac and mediastinal silhouettes. BONES AND SOFT TISSUES: Surgical clips in the right neck. No acute osseous abnormality. IMPRESSION: 1. No acute cardiopulmonary process. Electronically signed by: Pinkie Pebbles MD 08/28/2024 08:25 PM EST RP Workstation: [id number]     Assessment and Plan:  Uzziel Russey Charlot is a 78 y.o. male with a hx of hypertension, hyperlipidemia, GERD, OSA who is being seen 08/30/2024 for the evaluation of chest pain at the request of Dr.  Doretha.  Chest burning -- Developed centralized chest burning 2 AM Monday morning which resolved with Tylenol .  Had recurrent episodes over the next couple of days and presented to urgent care 12/10.  Referred to the ED with negative workup done at [facility].  And presented to the [facility] to attempt to establish with cardiology and was referred back to the ED for CT angio chest. -- High-sensitivity troponins negative x 2, EKG without acute ischemic changes -- CT angio chest negative for PE but notable for coronary artery calcifications -- Discussed options with patient regarding overnight observation with potential coronary CTA over the weekend versus discharging home and obtaining coronary CTA as an outpatient.  He prefers to undergo outpatient testing which is reasonable given his negative enzymes and resolution of symptoms. -- Will send message to arrange for outpatient CCTa/echo and follow up. Metoprolol 50mg  x1 ordered from [facility] pharmacy prior to discharge for scan.  -- continue ASA, statin, losartan   HTN -- elevated in the ED, but has not received his usual meds -- losartan  100mg  PTA   HLD -- on pravastatin    MD to see  For questions or updates, please contact [facility] Please consult [email or web address] for contact info under   Signed, Chris Rummer, NP  08/30/2024 2:23 PM     [1]  Allergies Allergen Reactions   Sulfa Antibiotics Hives   Hydrocodone  Nausea And Vomiting    can not take on an empty stomach   Penicillins Other (See Comments) and Rash    Ulcers on eyes

## 2024-08-30 NOTE — Discharge Instructions (Signed)
 You were given a prescription for metoprolol, please take 1 tablet prior to your cardiac scan.   If you experience any worsening symptoms please return to the ED for further evaluation.

## 2024-08-30 NOTE — ED Notes (Signed)
 Cards at [facility].

## 2024-08-30 NOTE — Progress Notes (Signed)
 Outpatient Coronary CTa ordered per Dr. Elmira

## 2024-09-02 ENCOUNTER — Emergency Department (HOSPITAL_BASED_OUTPATIENT_CLINIC_OR_DEPARTMENT_OTHER)
Admission: RE | Admit: 2024-09-02 | Discharge: 2024-09-02 | Disposition: A | Discharge status: Home / Self Care | Source: Ambulatory Visit | Attending: Internal Medicine | Admitting: Internal Medicine

## 2024-09-02 ENCOUNTER — Other Ambulatory Visit: Payer: Self-pay | Admitting: Internal Medicine

## 2024-09-02 ENCOUNTER — Emergency Department (HOSPITAL_COMMUNITY): Admit: 2024-09-02 | Discharge: 2024-09-02 | Attending: Cardiology | Admitting: Cardiology

## 2024-09-02 DIAGNOSIS — I2510 Atherosclerotic heart disease of native coronary artery without angina pectoris: Secondary | ICD-10-CM | POA: Diagnosis not present

## 2024-09-02 DIAGNOSIS — R079 Chest pain, unspecified: Secondary | ICD-10-CM

## 2024-09-02 DIAGNOSIS — R931 Abnormal findings on diagnostic imaging of heart and coronary circulation: Secondary | ICD-10-CM

## 2024-09-02 MED ORDER — NITROGLYCERIN 0.4 MG SL SUBL
0.8000 mg | SUBLINGUAL_TABLET | Freq: Once | SUBLINGUAL | Status: AC
  Administered 2024-09-02: 10:00:00 0.8 mg via SUBLINGUAL

## 2024-09-02 MED ORDER — IOHEXOL 350 MG/ML SOLN: 75.0000 mL | Freq: Once | INTRAVENOUS | Status: DC | PRN

## 2024-09-02 MED ORDER — IOHEXOL 350 MG/ML SOLN
100.0000 mL | Freq: Once | INTRAVENOUS | Status: AC | PRN
  Administered 2024-09-02: 10:00:00 100 mL via INTRAVENOUS

## 2024-09-05 ENCOUNTER — Telehealth: Admitting: Physician Assistant

## 2024-09-05 DIAGNOSIS — J208 Acute bronchitis due to other specified organisms: Secondary | ICD-10-CM

## 2024-09-05 NOTE — Progress Notes (Signed)
° °  Thank you for the details you included in the comment boxes. Those details are very helpful in determining the best course of treatment for you and help us  to provide the best care.Because of recurrent URI symptoms after treatment via e-visit on 12/10, and with recent cardiac issues, we recommend that you schedule a Virtual Urgent Care video visit in order for the provider to better assess what is going on.  The provider will be able to give you a more accurate diagnosis and treatment plan if we can more freely discuss your symptoms and with the addition of a virtual examination.   If you change your visit to a video visit, we will bill your insurance (similar to an office visit) and you will not be charged for this e-Visit. You will be able to stay at home and speak with the first available [facility] advanced practice provider. The link to do a video visit is in the drop down Menu tab of your Welcome screen in MyChart.

## 2024-09-17 ENCOUNTER — Ambulatory Visit: Payer: Self-pay | Admitting: Cardiology

## 2024-09-23 ENCOUNTER — Ambulatory Visit: Admitting: Nurse Practitioner

## 2025-07-09 ENCOUNTER — Ambulatory Visit: Admitting: Family Medicine

## 2025-07-18 ENCOUNTER — Ambulatory Visit
